# Patient Record
Sex: Female | Born: 1980 | Race: White | Hispanic: No | Marital: Single | Smoking: Never smoker
Health system: Southern US, Community
[De-identification: ages and names within clinical notes are randomized; demographics above are authoritative.]

## PROBLEM LIST (undated history)

## (undated) ENCOUNTER — Encounter

## (undated) ENCOUNTER — Ambulatory Visit

## (undated) ENCOUNTER — Telehealth

## (undated) ENCOUNTER — Ambulatory Visit: Payer: BLUE CROSS/BLUE SHIELD

## (undated) ENCOUNTER — Ambulatory Visit: Attending: Family | Primary: Family

## (undated) ENCOUNTER — Encounter
Attending: Student in an Organized Health Care Education/Training Program | Primary: Student in an Organized Health Care Education/Training Program

## (undated) ENCOUNTER — Ambulatory Visit
Payer: Medicaid (Managed Care) | Attending: Student in an Organized Health Care Education/Training Program | Primary: Student in an Organized Health Care Education/Training Program

## (undated) ENCOUNTER — Telehealth
Attending: Student in an Organized Health Care Education/Training Program | Primary: Student in an Organized Health Care Education/Training Program

## (undated) ENCOUNTER — Ambulatory Visit: Payer: Medicaid (Managed Care)

## (undated) DIAGNOSIS — Z803 Family history of malignant neoplasm of breast: Secondary | ICD-10-CM

## (undated) DIAGNOSIS — G40802 Other epilepsy, not intractable, without status epilepticus: Secondary | ICD-10-CM

## (undated) DIAGNOSIS — Z8614 Personal history of Methicillin resistant Staphylococcus aureus infection: Secondary | ICD-10-CM

## (undated) DIAGNOSIS — R519 Headache, unspecified: Secondary | ICD-10-CM

## (undated) DIAGNOSIS — Z8739 Personal history of other diseases of the musculoskeletal system and connective tissue: Secondary | ICD-10-CM

## (undated) DIAGNOSIS — T4145XA Adverse effect of unspecified anesthetic, initial encounter: Secondary | ICD-10-CM

## (undated) DIAGNOSIS — R569 Unspecified convulsions: Secondary | ICD-10-CM

## (undated) DIAGNOSIS — Z87442 Personal history of urinary calculi: Secondary | ICD-10-CM

## (undated) DIAGNOSIS — G809 Cerebral palsy, unspecified: Secondary | ICD-10-CM

## (undated) DIAGNOSIS — K219 Gastro-esophageal reflux disease without esophagitis: Secondary | ICD-10-CM

## (undated) DIAGNOSIS — L732 Hidradenitis suppurativa: Secondary | ICD-10-CM

## (undated) DIAGNOSIS — R51 Headache: Secondary | ICD-10-CM

## (undated) DIAGNOSIS — G40219 Localization-related (focal) (partial) symptomatic epilepsy and epileptic syndromes with complex partial seizures, intractable, without status epilepticus: Secondary | ICD-10-CM

## (undated) DIAGNOSIS — G40909 Epilepsy, unspecified, not intractable, without status epilepticus: Secondary | ICD-10-CM

## (undated) DIAGNOSIS — T8859XA Other complications of anesthesia, initial encounter: Secondary | ICD-10-CM

## (undated) DIAGNOSIS — Z9689 Presence of other specified functional implants: Secondary | ICD-10-CM

## (undated) HISTORY — DX: Localization-related (focal) (partial) symptomatic epilepsy and epileptic syndromes with complex partial seizures, intractable, without status epilepticus: G40.219

## (undated) HISTORY — PX: AXILLARY HIDRADENITIS EXCISION: SUR522

## (undated) HISTORY — DX: Presence of other specified functional implants: Z96.89

## (undated) HISTORY — DX: Family history of malignant neoplasm of breast: Z80.3

## (undated) HISTORY — DX: Other epilepsy, not intractable, without status epilepticus: G40.802

## (undated) HISTORY — DX: Cerebral palsy, unspecified: G80.9

## (undated) HISTORY — DX: Hidradenitis suppurativa: L73.2

## (undated) HISTORY — DX: Personal history of other diseases of the musculoskeletal system and connective tissue: Z87.39

## (undated) HISTORY — DX: Epilepsy, unspecified, not intractable, without status epilepticus: G40.909

## (undated) HISTORY — PX: TONSILLECTOMY: SUR1361

## (undated) HISTORY — PX: TYMPANOSTOMY TUBE PLACEMENT: SHX32

## (undated) MED ORDER — MAGNESIUM OXIDE 400 MG (241.3 MG MAGNESIUM) TABLET: Freq: Every day | ORAL | 0.00000 days

## (undated) MED ORDER — ASCORBIC ACID (VITAMIN C) 1,000 MG TABLET: ORAL | 0.00000 days

---
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
## 2004-07-02 HISTORY — PX: OTHER SURGICAL HISTORY: SHX169

## 2006-07-02 DIAGNOSIS — Z8614 Personal history of Methicillin resistant Staphylococcus aureus infection: Secondary | ICD-10-CM

## 2006-07-02 HISTORY — DX: Personal history of Methicillin resistant Staphylococcus aureus infection: Z86.14

## 2008-07-02 HISTORY — PX: KNEE SURGERY: SHX244

## 2015-03-03 HISTORY — PX: VAGUS NERVE STIMULATOR GENERATOR CHANGE: SHX6854

## 2017-04-01 HISTORY — PX: OTHER SURGICAL HISTORY: SHX169

## 2017-11-21 ENCOUNTER — Emergency Department
Admission: EM | Admit: 2017-11-21 | Discharge: 2017-11-21 | Disposition: A | Discharge status: Home / Self Care | Payer: Medicaid Other | Attending: Emergency Medicine | Admitting: Emergency Medicine

## 2017-11-21 ENCOUNTER — Encounter: Payer: Self-pay | Admitting: *Deleted

## 2017-11-21 ENCOUNTER — Other Ambulatory Visit: Payer: Self-pay

## 2017-11-21 DIAGNOSIS — R2242 Localized swelling, mass and lump, left lower limb: Secondary | ICD-10-CM | POA: Diagnosis present

## 2017-11-21 DIAGNOSIS — L0291 Cutaneous abscess, unspecified: Secondary | ICD-10-CM

## 2017-11-21 DIAGNOSIS — L02214 Cutaneous abscess of groin: Secondary | ICD-10-CM | POA: Insufficient documentation

## 2017-11-21 HISTORY — DX: Unspecified convulsions: R56.9

## 2017-11-21 MED ORDER — HYDROMORPHONE HCL 1 MG/ML IJ SOLN: 0.5000 mg | Freq: Once | INTRAMUSCULAR | Status: DC

## 2017-11-21 MED ORDER — OXYCODONE-ACETAMINOPHEN 5-325 MG PO TABS: 1.0000 | ORAL_TABLET | Freq: Three times a day (TID) | ORAL | 0 refills | Status: DC | PRN

## 2017-11-21 MED ORDER — CLINDAMYCIN HCL 300 MG PO CAPS: 300.0000 mg | ORAL_CAPSULE | Freq: Three times a day (TID) | ORAL | 0 refills | Status: DC

## 2017-11-21 MED ORDER — LIDOCAINE-EPINEPHRINE 2 %-1:100000 IJ SOLN
10.0000 mL | Freq: Once | INTRAMUSCULAR | Status: AC
  Administered 2017-11-21: 10 mL via INTRADERMAL
  Filled 2017-11-21: qty 10

## 2017-11-21 MED ORDER — MUPIROCIN 2 % EX OINT: TOPICAL_OINTMENT | CUTANEOUS | 0 refills | Status: DC

## 2017-11-21 MED ORDER — HYDROMORPHONE HCL 1 MG/ML IJ SOLN
1.0000 mg | Freq: Once | INTRAMUSCULAR | Status: DC
  Administered 2017-11-21: 0.5 mg via INTRAMUSCULAR
  Filled 2017-11-21: qty 1

## 2017-11-21 NOTE — ED Triage Notes (Addendum)
Pt to ED reporting having a hx of hidradenitis suppurativa with 12 surgeries to remove cysts in armpits and left side of groin. Pt reports for the past week she has had a worsening mass in her left groin again that has spread in to the lower part of her abd. Area is red at this time. No drainage noted. Pt has been taking antibiotics preventatively and has not seen improvement. No fevers reported.

## 2017-11-21 NOTE — ED Provider Notes (Signed)
[facility] Emergency Department Provider Note       Time seen: ----------------------------------------- 1:49 PM on 11/21/2017 -----------------------------------------   I have reviewed the triage vital signs and the nursing notes.  HISTORY   Chief Complaint Abscess    HPI Destiny Flores is a 37 y.o. female with a history of seizures who presents to the ED for possible abscess.  Patient has a history of hidradenitis and has had 12 surgeries to remove cyst in the armpits in her groin.  Over the past week she has had a worsening mass in the left suprapubic area.  She is having redness and swelling with pain to the area at this time.  She was taking Keflex to try to prevent this from happening.  She denies fevers, chills or other complaints.  Past Medical History:  Diagnosis Date  . Seizures (HCC)     There are no active problems to display for this patient.   History reviewed. No pertinent surgical history.  Allergies Codeine and Penicillins  Social History Social History   Tobacco Use  . Smoking status: Never Smoker  . Smokeless tobacco: Never Used  Substance Use Topics  . Alcohol use: Never    Frequency: Never  . Drug use: Never   Review of Systems Constitutional: Negative for fever. Cardiovascular: Negative for chest pain. Respiratory: Negative for shortness of breath. Gastrointestinal: Positive for lower abdominal pain over the site of an abscess Musculoskeletal: Negative for back pain. Skin: Positive for skin erythema and possible abscess formation Neurological: Negative for headaches, focal weakness or numbness.  All systems negative/normal/unremarkable except as stated in the HPI  ____________________________________________   PHYSICAL EXAM:  VITAL SIGNS: ED Triage Vitals [11/21/17 1343]  Enc Vitals Group     BP 111/68     Pulse Rate 78     Resp 16     Temp 97.7 F (36.5 C)     Temp Source Oral     SpO2 99 %   Weight 221 lb (100.2 kg)     Height  (1.626 m)     Head Circumference      Peak Flow      Pain Score 8     Pain Loc      Pain Edu?      Excl. in GC?    Constitutional: Alert and oriented. Well appearing and in no distress. Gastrointestinal: Soft with focal tenderness just to the left of midline in the suprapubic region around the site of induration and erythema.  This approaches her low transverse cesarean section site Musculoskeletal: Nontender with normal range of motion in extremities. No lower extremity tenderness nor edema. Neurologic:  Normal speech and language. No gross focal neurologic deficits are appreciated.  Skin: Erythema and induration in the suprapubic region Psychiatric: Mood and affect are normal. Speech and behavior are normal.  ____________________________________________  ED COURSE:  As part of my medical decision making, I reviewed the following data within the electronic MEDICAL RECORD NUMBER History obtained from family if available, nursing notes, old chart and ekg, as well as notes from prior ED visits. Patient presented for skin abscess and cellulitis, we will assess with labs and imaging as indicated at this time.   Marland Kitchen.Incision and Drainage Date/Time: 11/21/2017 2:05 PM Performed by: Emily Filbert, MD Authorized by: Emily Filbert, MD   Consent:    Consent obtained:  Verbal   Consent given by:  Patient   Risks discussed:  Infection Location:  Type:  Abscess   Location:  Trunk   Trunk location:  Abdomen Pre-procedure details:    Skin preparation:  Betadine Anesthesia (see MAR for exact dosages):    Anesthesia method:  Local infiltration   Local anesthetic:  Lidocaine 2% WITH epi Procedure type:    Complexity:  Complex Procedure details:    Needle aspiration: no     Incision types:  Single straight   Incision depth:  Subcutaneous   Scalpel blade:  11   Drainage:  Purulent   Drainage amount:  Moderate   Packing materials:  1/4 in  iodoform gauze Post-procedure details:    Patient tolerance of procedure:  Tolerated well, no immediate complications   ____________________________________________  DIFFERENTIAL DIAGNOSIS   Cellulitis, abscess, hidradenitis  FINAL ASSESSMENT AND PLAN  Cellulitis, abscess   Plan: The patient had presented for redness and swelling in the lower abdomen.  She received a shot of Dilaudid IM, and underwent abscess incision and drainage as dictated above.  She is stable for outpatient follow-up.   Ulice Dash, MD   Note: This note was generated in part or whole with voice recognition software. Voice recognition is usually quite accurate but there are transcription errors that can and very often do occur. I apologize for any typographical errors that were not detected and corrected.     Emily Filbert, MD 11/21/17 [phone]

## 2017-12-10 ENCOUNTER — Encounter: Payer: Self-pay | Admitting: Emergency Medicine

## 2017-12-10 ENCOUNTER — Emergency Department
Admission: EM | Admit: 2017-12-10 | Discharge: 2017-12-10 | Disposition: A | Discharge status: Home / Self Care | Payer: Medicaid Other | Attending: Emergency Medicine | Admitting: Emergency Medicine

## 2017-12-10 ENCOUNTER — Other Ambulatory Visit: Payer: Self-pay

## 2017-12-10 DIAGNOSIS — L0291 Cutaneous abscess, unspecified: Secondary | ICD-10-CM

## 2017-12-10 DIAGNOSIS — L02211 Cutaneous abscess of abdominal wall: Secondary | ICD-10-CM | POA: Diagnosis not present

## 2017-12-10 DIAGNOSIS — L732 Hidradenitis suppurativa: Secondary | ICD-10-CM | POA: Insufficient documentation

## 2017-12-10 HISTORY — DX: Hidradenitis suppurativa: L73.2

## 2017-12-10 MED ORDER — MUPIROCIN 2 % EX OINT: TOPICAL_OINTMENT | CUTANEOUS | 0 refills | Status: DC

## 2017-12-10 MED ORDER — CEPHALEXIN 500 MG PO CAPS: 500.0000 mg | ORAL_CAPSULE | Freq: Three times a day (TID) | ORAL | 0 refills | Status: DC

## 2017-12-10 MED ORDER — OXYCODONE-ACETAMINOPHEN 5-325 MG PO TABS: 1.0000 | ORAL_TABLET | Freq: Three times a day (TID) | ORAL | 0 refills | Status: DC | PRN

## 2017-12-10 NOTE — Discharge Instructions (Addendum)
Follow-up with Dr. Everlene FarrierPabon for evaluation of your abscess.  Apply warm compress to the area.  Take medication as prescribed.  If you are worsening please return the emergency department

## 2017-12-10 NOTE — ED Notes (Signed)
See triage note  Presents with abscess area to abd   States she developed about 3 areas over the past few days  Has one area that it draining in abd fold

## 2017-12-10 NOTE — ED Provider Notes (Signed)
[facility] Emergency Department Provider Note  ____________________________________________   First MD Initiated Contact with Patient 12/10/17 1306     (approximate)  I have reviewed the triage vital signs and the nursing notes.   HISTORY  Chief Complaint Abscess    HPI Destiny FurnishRebecca Zechman is a 37 y.o. female presents emergency department complaining of an abscess to the abdominal area.  She states that she has hidradenitis.  She gets these areas often.  She just finished clindamycin 4 days ago.  States the area is very sore and has not started to drain yet.  She denies any fever or chills.  She denies any vomiting or diarrhea.  She states her surgeon from where she used to live always gave her Keflex which worked better.  Past Medical History:  Diagnosis Date  . Hydradenitis   . Seizures (HCC)     There are no active problems to display for this patient.   Past Surgical History:  Procedure Laterality Date  . abscess surgery     armpits and groin.  Marland Kitchen. KNEE SURGERY    . NOSE SURGERY    . TONSILLECTOMY    . TYMPANOSTOMY TUBE PLACEMENT      Prior to Admission medications   Medication Sig Start Date End Date Taking? Authorizing Provider  cephALEXin (KEFLEX) 500 MG capsule Take 1 capsule (500 mg total) by mouth 3 (three) times daily. 12/10/17   Eliani Leclere, Roselyn BeringSusan W, PA-C  mupirocin ointment (BACTROBAN) 2 % Apply to affected area 3 times daily 12/10/17   Sherrie MustacheFisher, Roselyn BeringSusan W, PA-C  oxyCODONE-acetaminophen (PERCOCET) 5-325 MG tablet Take 1-2 tablets by mouth every 8 (eight) hours as needed. 12/10/17   Graycee Greeson, Roselyn BeringSusan W, PA-C    Allergies Codeine; Penicillins; and Sulfa antibiotics  No family history on file.  Social History Social History   Tobacco Use  . Smoking status: Never Smoker  . Smokeless tobacco: Never Used  Substance Use Topics  . Alcohol use: Never    Frequency: Never  . Drug use: Never    Review of Systems  Constitutional: No  fever/chills Eyes: No visual changes. ENT: No sore throat. Respiratory: Denies cough Genitourinary: Negative for dysuria. Musculoskeletal: Negative for back pain. Skin: Negative for rash.  Positive for abscess on the abdomen    ____________________________________________   PHYSICAL EXAM:  VITAL SIGNS: ED Triage Vitals  Enc Vitals Group     BP 12/10/17 1241 104/60     Pulse Rate 12/10/17 1241 83     Resp 12/10/17 1241 18     Temp 12/10/17 1241 97.9 F (36.6 C)     Temp Source 12/10/17 1241 Oral     SpO2 12/10/17 1241 100 %     Weight 12/10/17 1241 220 lb (99.8 kg)     Height 12/10/17 1241 5\' 4"  (1.626 m)     Head Circumference --      Peak Flow --      Pain Score 12/10/17 1246 6     Pain Loc --      Pain Edu? --      Excl. in GC? --     Constitutional: Alert and oriented. Well appearing and in no acute distress. Eyes: Conjunctivae are normal.  Head: Atraumatic. Nose: No congestion/rhinnorhea. Mouth/Throat: Mucous membranes are moist.   Cardiovascular: Normal rate, regular rhythm. Respiratory: Normal respiratory effort.  No retractions GU: deferred Musculoskeletal: FROM all extremities, warm and well perfused Neurologic:  Normal speech and language.  Skin:  Skin is warm,  dry .  Positive for a abscess on the lower abdomen which tracks to an opening where there is purulent drainage with a strong odor. Psychiatric: Mood and affect are normal. Speech and behavior are normal.  ____________________________________________   LABS (all labs ordered are listed, but only abnormal results are displayed)  Labs Reviewed - No data to display ____________________________________________   ____________________________________________  RADIOLOGY    ____________________________________________   PROCEDURES  Procedure(s) performed: No  Procedures    ____________________________________________   INITIAL IMPRESSION / ASSESSMENT AND PLAN / ED COURSE  Pertinent  labs & imaging results that were available during my care of the patient were reviewed by me and considered in my medical decision making (see chart for details).  Patient is 37 year old female presents emergency department complaining of abscess to the lower abdomen.  She states the area has been there over the past few weeks and she just recently finished clindamycin.  The area is painless is not started to drain yet.  On physical exam the abdomen has a abscess that tracks to an opening which has purulent drainage with a strong odor.  Remainder the exam is unremarkable  Nursing staff is to apply an ABD pad  Patient was given a prescription for Keflex, Percocet and Bactroban ointment.  She is given the phone number for a Biomedical engineer.  She is to follow-up with them for any issues.  She is to return to the emergency department if worsening.  She is discharged in stable condition.     As part of my medical decision making, I reviewed the following data within the electronic MEDICAL RECORD NUMBER Nursing notes reviewed and incorporated, Old chart reviewed, Notes from prior ED visits and [place] Controlled Substance Database  ____________________________________________   FINAL CLINICAL IMPRESSION(S) / ED DIAGNOSES  Final diagnoses:  Abscess      NEW MEDICATIONS STARTED DURING THIS VISIT:  New Prescriptions   CEPHALEXIN (KEFLEX) 500 MG CAPSULE    Take 1 capsule (500 mg total) by mouth 3 (three) times daily.     Note:  This document was prepared using Dragon voice recognition software and may include unintentional dictation errors.    Faythe Ghee, PA-C 12/10/17 1324    Nita Sickle, MD 12/11/17 1248

## 2017-12-10 NOTE — ED Notes (Signed)
Dressing applied to lower abd

## 2017-12-10 NOTE — ED Triage Notes (Signed)
Cyst that looks infected left lower abd.  History of hydrodinitis

## 2017-12-24 DIAGNOSIS — G809 Cerebral palsy, unspecified: Secondary | ICD-10-CM

## 2017-12-24 DIAGNOSIS — L732 Hidradenitis suppurativa: Secondary | ICD-10-CM | POA: Insufficient documentation

## 2017-12-24 DIAGNOSIS — Z9689 Presence of other specified functional implants: Secondary | ICD-10-CM

## 2017-12-24 DIAGNOSIS — G40802 Other epilepsy, not intractable, without status epilepticus: Secondary | ICD-10-CM

## 2017-12-24 DIAGNOSIS — G40909 Epilepsy, unspecified, not intractable, without status epilepticus: Secondary | ICD-10-CM

## 2017-12-24 DIAGNOSIS — G40219 Localization-related (focal) (partial) symptomatic epilepsy and epileptic syndromes with complex partial seizures, intractable, without status epilepticus: Secondary | ICD-10-CM

## 2017-12-24 HISTORY — DX: Localization-related (focal) (partial) symptomatic epilepsy and epileptic syndromes with complex partial seizures, intractable, without status epilepticus: G40.219

## 2017-12-24 HISTORY — DX: Other epilepsy, not intractable, without status epilepticus: G40.802

## 2017-12-24 HISTORY — DX: Epilepsy, unspecified, not intractable, without status epilepticus: G40.909

## 2017-12-24 HISTORY — DX: Cerebral palsy, unspecified: G80.9

## 2017-12-24 HISTORY — DX: Hidradenitis suppurativa: L73.2

## 2017-12-24 HISTORY — DX: Presence of other specified functional implants: Z96.89

## 2017-12-26 ENCOUNTER — Encounter: Payer: Self-pay | Admitting: Surgery

## 2017-12-26 ENCOUNTER — Ambulatory Visit (INDEPENDENT_AMBULATORY_CARE_PROVIDER_SITE_OTHER): Payer: Medicaid Other | Admitting: Surgery

## 2017-12-26 VITALS — BP 118/83 | HR 77 | Temp 98.2°F | Ht 64.0 in | Wt 231.6 lb

## 2017-12-26 DIAGNOSIS — L732 Hidradenitis suppurativa: Secondary | ICD-10-CM | POA: Diagnosis not present

## 2017-12-26 NOTE — H&P (View-Only) (Signed)
Patient ID: Destiny Flores, female   DOB: [birth year], 37 y.o.   MRN: [id number]  HPI Destiny Flores is a 37 y.o. 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  She reports that she has had at least 13 operation for her hidradenitis on both axillas and both groins.  She recently moved from [place] and has been dealing with hidradenitis for several years.  Previously has seen multiple plastic surgeons and multiple procedures have been attempted including excision of hidradenitis with primary closure and wound VAC placement as well as excision of hidradenitis with debridement and secondary intention healing. He was recently seen in the emergency room and abscess was seen.  She was placed on antibiotics and she has improved somewhat.  She continues to have persistent drainage from the groins.  She has intermittent mo moderate pain that is sharp in nature.  No fevers no chills.  She does not smoke.  She has previously been on immunological therapy by a dermatologist in [place].  She does not have a current dermatologist at this time. She is able to perform 4 METS of activity without any shortness of breath chest pain. No operative reports have been found and I do not have access to out-of-state records.  HPI  Past Medical History:  Diagnosis Date  . Cerebral palsy (HCC) 12/24/2017  . Epilepsy (HCC) 12/24/2017  . 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 Hidradenitis suppurativa 12/24/2017  . Hydradenitis   . Seizures (HCC)   . 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 abscess surgery     armpits and groin.  Marland Kitchen AXILLARY HIDRADENITIS EXCISION    . hidradenitis groin    . KNEE SURGERY    . NOSE SURGERY    . TONSILLECTOMY    . TYMPANOSTOMY TUBE PLACEMENT      Family  History  Problem Relation Age of Onset  . Hyperlipidemia Father   . Epilepsy Brother   . Breast cancer Paternal Grandmother     Social History Social History   Tobacco Use  . Smoking status: Never Smoker  . Smokeless tobacco: Never Used  Substance Use Topics  . Alcohol use: Never    Frequency: Never  . Drug use: Never    Allergies  Allergen Reactions  . Codeine Anaphylaxis  . Penicillins Anaphylaxis  . Sulfa Antibiotics Hives    Current Outpatient Medications  Medication Sig Dispense Refill  . acetaZOLAMIDE (DIAMOX) 500 MG capsule     . cephALEXin (KEFLEX) 500 MG capsule Take 1 capsule (500 mg total) by mouth 3 (three) times daily. 21 capsule 0  . Cholecalciferol (VITAMIN D3) 50000 units CAPS     . clindamycin (CLINDAGEL) 1 % gel     . diazepam (VALIUM) 5 MG tablet     . felbamate (FELBATOL) 400 MG tablet     . felbamate (FELBATOL) 600 MG tablet     . metoCLOPramide (REGLAN) 10 MG tablet     . Multiple Vitamin (DAILY-VITE) TABS     . mupirocin ointment (BACTROBAN) 2 % Apply to affected area 3 times daily 22 g 0  . omeprazole (PRILOSEC) 20 MG capsule     . oxyCODONE-acetaminophen (PERCOCET) 5-325 MG tablet Take 1-2 tablets by mouth every 8 (eight) hours as needed. 20 tablet 0  . Turmeric 1053 MG  TABS     . verapamil (CALAN) 120 MG tablet     . zinc gluconate ([employer] ZINC) 50 MG tablet      No current facility-administered medications for this visit.      Review of Systems Full ROS  was asked and was negative except for the information on the HPI  Physical Exam Blood pressure 118/83, pulse 77, temperature 98.2 F (36.8 C), temperature source Oral, height 5\' 4"  (1.626 m), weight 105.1 kg (231 lb 9.6 oz). CONSTITUTIONAL: NAD EYES: Pupils are equal, round, and reactive to light, Sclera are non-icteric. EARS, NOSE, MOUTH AND THROAT: The oropharynx is clear. The oral mucosa is pink and moist. Hearing is intact to voice. LYMPH NODES:  Lymph nodes in the neck are  normal. RESPIRATORY:  Lungs are clear. There is normal respiratory effort, with equal breath sounds bilaterally, and without pathologic use of accessory muscles. CARDIOVASCULAR: Heart is regular without murmurs, gallops, or rubs. GI: The abdomen is  soft, nontender, and nondistended. There are no palpable masses. There is no hepatosplenomegaly. There are normal bowel sounds in all quadrants. GU: Rectal deferred.   MUSCULOSKELETAL: Normal muscle strength and tone. No cyanosis or edema.   SKIN: There is multiple sinus tracts on the groin mainly on the left side but there is some towards the midline as well.  There is no evidence of a necrotizing infection or obvious abscess at this time.   NEUROLOGIC: Motor and sensation is grossly normal. Cranial nerves are grossly intact. PSYCH:  Oriented to person, place and time. Affect is normal.  Assessment/Plan complex and recurrent hidradenitis of the groin on a 37 year old female with significant comorbidities including cerebral palsy and seizure disorder as well as obesity.  I had a lengthy discussion with the patient and her mother in detail.  I explained to them specifically that hidradenitis is a chronic and complex disease.  Since these lesions have been present for several months and there is still some tracts I do think that the next best step will be to perform an excision and debridement of the tract and let the wound heal by secondary intention.  Discussed with the patient and the mother again in detail about the procedure.  Risk, benefit and possible complications including but not limited to: Bleeding, infection, chronic pain, prolonged wound healing.  She understands and wishes to proceed.  I also gave her the option of referral to a plastic surgeon but they do feel comfortable with me taking care of this issue at this time.  Extensive counseling provided   Sterling Bigiego Pabon, MD FACS General Surgeon 12/27/2017, 12:20 PM

## 2017-12-26 NOTE — Patient Instructions (Signed)
Please see your blue sheet for surgery information.  We will call you with the exact surgery date.      Wash with Chlorhexadine daily in the place of your regular soap or body wash to these areas.  If you have an abscess that you have been trying the above recommended therapy and this is not working, call our office and speak with a nurse so that we may work you in to our schedule.   Hidradenitis Suppurativa Hidradenitis suppurativa is a long-term (chronic) skin disease that starts with blocked sweat glands or hair follicles. Bacteria may grow in these blocked openings of your skin. Hidradenitis suppurativa is like a severe form of acne that develops in areas of your body where acne would be unusual. It is most likely to affect the areas of your body where skin rubs against skin and becomes moist. This includes your:  Underarms.  Groin.  Genital areas.  Buttocks.  Upper thighs.  Breasts. Hidradenitis suppurativa may start out with small pimples. The pimples can develop into deep sores that break open (rupture) and drain pus. Over time your skin may thicken and become scarred. Hidradenitis suppurativa cannot be passed from person to person.  CAUSES  The exact cause of hidradenitis suppurativa is not known. This condition may be due to:  Female and female hormones. The condition is rare before and after puberty.  An overactive body defense system (immune system). Your immune system may overreact to the blocked hair follicles or sweat glands and cause swelling and pus-filled sores. RISK FACTORS You may have a higher risk of hidradenitis suppurativa if you:  Are a woman.  Are between ages 8011 and 8555.  Have a family history of hidradenitis suppurativa.  Have a personal history of acne.  Are overweight.  Smoke.  Take the drug lithium. SIGNS AND SYMPTOMS  The first signs of an outbreak are usually painful skin bumps that look like pimples. As the condition progresses:  Skin  bumps may get bigger and grow deeper into the skin.  Bumps under the skin may rupture and drain smelly pus.  Skin may become itchy and infected.  Skin may thicken and scar.  Drainage may continue through tunnels under the skin (fistulas).  Walking and moving your arms can become painful. DIAGNOSIS  Your health care provider may diagnose hidradenitis suppurativa based on your medical history and your signs and symptoms. A physical exam will also be done. You may need to see a health care provider who specializes in skin diseases (dermatologist). You may also have tests done to confirm the diagnosis. These can include:  Swabbing a sample of pus or drainage from your skin so it can be sent to the lab and tested for infection.  Blood tests to check for infection. TREATMENT  The same treatment will not work for everybody with hidradenitis suppurativa. Your treatment will depend on how severe your symptoms are. You may need to try several treatments to find what works best for you. Part of your treatment may include cleaning and bandaging (dressing) your wounds. You may also have to take medicines, such as the following:  Antibiotics.  Acne medicines.  Medicines to block or suppress the immune system.  A diabetes medicine (metformin) is sometimes used to treat this condition.  For women, birth control pills can sometimes help relieve symptoms. You may need surgery if you have a severe case of hidradenitis suppurativa that does not respond to medicine. Surgery may involve:   Using a  laser to clear the skin and remove hair follicles.  Opening and draining deep sores.  Removing the areas of skin that are diseased and scarred. HOME CARE INSTRUCTIONS  Learn as much as you can about your disease, and work closely with your health care providers.  Take medicines only as directed by your health care provider.  If you were prescribed an antibiotic medicine, finish it all even if you start  to feel better.  If you are overweight, losing weight may be very helpful. Try to reach and maintain a healthy weight.  Do not use any tobacco products, including cigarettes, chewing tobacco, or electronic cigarettes. If you need help quitting, ask your health care provider.  Do not shave the areas where you get hidradenitis suppurativa.  Do not wear deodorant.  Wear loose-fitting clothes.  Try not to overheat and get sweaty.  Take a daily bleach bath as directed by your health care provider.  Fill your bathtub halfway with water.  Pour in  cup of unscented household bleach.  Soak for 5-10 minutes.  Cover sore areas with a warm, clean washcloth (compress) for 5-10 minutes. SEEK MEDICAL CARE IF:   You have a flare-up of hidradenitis suppurativa.  You have chills or a fever.  You are having trouble controlling your symptoms at home.   This information is not intended to replace advice given to you by your health care provider. Make sure you discuss any questions you have with your health care provider.   Document Released: 01/31/2004 Document Revised: 07/09/2014 Document Reviewed: 09/18/2013 Elsevier Interactive Patient Education [employer]

## 2017-12-26 NOTE — Progress Notes (Signed)
Patient ID: Destiny Flores, female   DOB: 06/03/1981, 37 y.o.   MRN: [id number]  HPI Destiny Flores is a 37 y.o. 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  She reports that she has had at least 13 operation for her hidradenitis on both axillas and both groins.  She recently moved from [place] and has been dealing with hidradenitis for several years.  Previously has seen multiple plastic surgeons and multiple procedures have been attempted including excision of hidradenitis with primary closure and wound VAC placement as well as excision of hidradenitis with debridement and secondary intention healing. He was recently seen in the emergency room and abscess was seen.  She was placed on antibiotics and she has improved somewhat.  She continues to have persistent drainage from the groins.  She has intermittent mo moderate pain that is sharp in nature.  No fevers no chills.  She does not smoke.  She has previously been on immunological therapy by a dermatologist in [place].  She does not have a current dermatologist at this time. She is able to perform 4 METS of activity without any shortness of breath chest pain. No operative reports have been found and I do not have access to out-of-state records.  HPI  Past Medical History:  Diagnosis Date  . Cerebral palsy (HCC) 12/24/2017  . Epilepsy (HCC) 12/24/2017  . 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 Hidradenitis suppurativa 12/24/2017  . Hydradenitis   . Seizures (HCC)   . 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 abscess surgery     armpits and groin.  . AXILLARY HIDRADENITIS EXCISION    . hidradenitis groin    . KNEE SURGERY    . NOSE SURGERY    . TONSILLECTOMY    . TYMPANOSTOMY TUBE PLACEMENT      Family  History  Problem Relation Age of Onset  . Hyperlipidemia Father   . Epilepsy Brother   . Breast cancer Paternal Grandmother     Social History Social History   Tobacco Use  . Smoking status: Never Smoker  . Smokeless tobacco: Never Used  Substance Use Topics  . Alcohol use: Never    Frequency: Never  . Drug use: Never    Allergies  Allergen Reactions  . Codeine Anaphylaxis  . Penicillins Anaphylaxis  . Sulfa Antibiotics Hives    Current Outpatient Medications  Medication Sig Dispense Refill  . acetaZOLAMIDE (DIAMOX) 500 MG capsule     . cephALEXin (KEFLEX) 500 MG capsule Take 1 capsule (500 mg total) by mouth 3 (three) times daily. 21 capsule 0  . Cholecalciferol (VITAMIN D3) 50000 units CAPS     . clindamycin (CLINDAGEL) 1 % gel     . diazepam (VALIUM) 5 MG tablet     . felbamate (FELBATOL) 400 MG tablet     . felbamate (FELBATOL) 600 MG tablet     . metoCLOPramide (REGLAN) 10 MG tablet     . Multiple Vitamin (DAILY-VITE) TABS     . mupirocin ointment (BACTROBAN) 2 % Apply to affected area 3 times daily 22 g 0  . omeprazole (PRILOSEC) 20 MG capsule     . oxyCODONE-acetaminophen (PERCOCET) 5-325 MG tablet Take 1-2 tablets by mouth every 8 (eight) hours as needed. 20 tablet 0  . Turmeric 1053 MG   TABS     . verapamil (CALAN) 120 MG tablet     . zinc gluconate ([employer] ZINC) 50 MG tablet      No current facility-administered medications for this visit.      Review of Systems Full ROS  was asked and was negative except for the information on the HPI  Physical Exam Blood pressure 118/83, pulse 77, temperature 98.2 F (36.8 C), temperature source Oral, height 5' 4" (1.626 m), weight 105.1 kg (231 lb 9.6 oz). CONSTITUTIONAL: NAD EYES: Pupils are equal, round, and reactive to light, Sclera are non-icteric. EARS, NOSE, MOUTH AND THROAT: The oropharynx is clear. The oral mucosa is pink and moist. Hearing is intact to voice. LYMPH NODES:  Lymph nodes in the neck are  normal. RESPIRATORY:  Lungs are clear. There is normal respiratory effort, with equal breath sounds bilaterally, and without pathologic use of accessory muscles. CARDIOVASCULAR: Heart is regular without murmurs, gallops, or rubs. GI: The abdomen is  soft, nontender, and nondistended. There are no palpable masses. There is no hepatosplenomegaly. There are normal bowel sounds in all quadrants. GU: Rectal deferred.   MUSCULOSKELETAL: Normal muscle strength and tone. No cyanosis or edema.   SKIN: There is multiple sinus tracts on the groin mainly on the left side but there is some towards the midline as well.  There is no evidence of a necrotizing infection or obvious abscess at this time.   NEUROLOGIC: Motor and sensation is grossly normal. Cranial nerves are grossly intact. PSYCH:  Oriented to person, place and time. Affect is normal.  Assessment/Plan complex and recurrent hidradenitis of the groin on a 29-year-old female with significant comorbidities including cerebral palsy and seizure disorder as well as obesity.  I had a lengthy discussion with the patient and her mother in detail.  I explained to them specifically that hidradenitis is a chronic and complex disease.  Since these lesions have been present for several months and there is still some tracts I do think that the next best step will be to perform an excision and debridement of the tract and let the wound heal by secondary intention.  Discussed with the patient and the mother again in detail about the procedure.  Risk, benefit and possible complications including but not limited to: Bleeding, infection, chronic pain, prolonged wound healing.  She understands and wishes to proceed.  I also gave her the option of referral to a plastic surgeon but they do feel comfortable with me taking care of this issue at this time.  Extensive counseling provided   Farida Mcreynolds, MD FACS General Surgeon 12/27/2017, 12:20 PM     

## 2017-12-30 ENCOUNTER — Telehealth: Payer: Self-pay | Admitting: Surgery

## 2017-12-30 NOTE — Telephone Encounter (Signed)
I have called patient to go over information below. No answer. I have left a message on the voicemail for patient to call back.    Pre op date/time and sx date. Sx: 01/16/18 with Dr Pabon--excision of hidradenitis of groin.  Pre op: 01/09/18 between 1-5:00pm--phone interview.   Patient made aware to call [phone], between 1-3:00pm the day before surgery, to find out what time to arrive.

## 2017-12-31 NOTE — Telephone Encounter (Signed)
Patient has called and all information has been given.  °

## 2017-12-31 NOTE — Telephone Encounter (Signed)
I have called the phone number of both patient and the mother. No answer. I have left a message on both voicemail's to inform the patient to call our office to go over surgery date and pre admit date and time.

## 2018-01-01 ENCOUNTER — Telehealth: Payer: Self-pay | Admitting: Surgery

## 2018-01-01 NOTE — Telephone Encounter (Signed)
I l/m for patient to call the office & give the fax # to Dr Orson GearMavene Mash so we are able to fax medical record release.

## 2018-01-09 ENCOUNTER — Other Ambulatory Visit: Payer: Self-pay

## 2018-01-09 ENCOUNTER — Encounter
Admission: RE | Admit: 2018-01-09 | Discharge: 2018-01-09 | Disposition: A | Discharge status: Home / Self Care | Payer: PRIVATE HEALTH INSURANCE | Source: Ambulatory Visit | Attending: Surgery | Admitting: Surgery

## 2018-01-09 HISTORY — DX: Headache: R51

## 2018-01-09 HISTORY — DX: Other complications of anesthesia, initial encounter: T88.59XA

## 2018-01-09 HISTORY — DX: Headache, unspecified: R51.9

## 2018-01-09 HISTORY — DX: Personal history of urinary calculi: Z87.442

## 2018-01-09 HISTORY — DX: Adverse effect of unspecified anesthetic, initial encounter: T41.45XA

## 2018-01-09 HISTORY — DX: Gastro-esophageal reflux disease without esophagitis: K21.9

## 2018-01-09 HISTORY — DX: Personal history of Methicillin resistant Staphylococcus aureus infection: Z86.14

## 2018-01-09 NOTE — Patient Instructions (Signed)
Your procedure is scheduled on: 01-16-18 THURSDAY Report to Same Day Surgery 2nd floor medical mall [facility](Medical Mall Entrance-take elevator on left to 2nd floor.  Check in with surgery information desk.) To find out your arrival time please call [phone] between 1PM - 3PM [facility]  Remember: Instructions that are not followed completely may result in serious medical risk, up to and including death, or upon the discretion of your surgeon and anesthesiologist your surgery may need to be rescheduled.    _x___ 1. Do not eat food after midnight the night before your procedure. NO GUM OR CANDY AFTER MIDNIGHT.  You may drink clear liquids up to 2 hours before you are scheduled to arrive at the hospital for your procedure.  Do not drink clear liquids within 2 hours of your scheduled arrival to the hospital.  Clear liquids include  --Water or Apple juice without pulp  --Clear carbohydrate beverage such as ClearFast or Gatorade  --Black Coffee or Clear Tea (No milk, no creamers, do not add anything to the coffee or Tea     __x__ 2. No Alcohol for 24 hours before or after surgery.   __x__3. No Smoking or e-cigarettes for 24 prior to surgery.  Do not use any chewable tobacco products for at least 6 hour prior to surgery   ____  4. Bring all medications with you on the day of surgery if instructed.    __x__ 5. Notify your doctor if there is any change in your medical condition     (cold, fever, infections).    x___6. On the morning of surgery brush your teeth with toothpaste and water.  You may rinse your mouth with mouth wash if you wish.  Do not swallow any toothpaste or mouthwash.   Do not wear jewelry, make-up, hairpins, clips or nail polish.  Do not wear lotions, powders, or perfumes. You may wear deodorant.  Do not shave 48 hours prior to surgery. Men may shave face and neck.  Do not bring valuables to the hospital.    [facility] is not responsible for any belongings or  valuables.               Contacts, dentures or bridgework may not be worn into surgery.  Leave your suitcase in the car. After surgery it may be brought to your room.  For patients admitted to the hospital, discharge time is determined by your treatment team.  _  Patients discharged the day of surgery will not be allowed to drive home.  You will need someone to drive you home and stay with you the night of your procedure.    Please read over the following fact sheets that you were given:   [facility] Preparing for [facility] and or MRSA Information   _x___ TAKE THE FOLLOWING MEDICATION THE MORNING OF SURGERY WITH A SMALL SIP OF WATER. These include:  1. PRILOSEC  2. FELBATOL  3.  4.  5.  6.  ____Fleets enema or Magnesium Citrate as directed.   _x___ Use CHG Soap or sage wipes as directed on instruction sheet   ____ Use inhalers on the day of surgery and bring to hospital day of surgery  ____ Stop Metformin and Janumet 2 days prior to surgery.    ____ Take 1/2 of usual insulin dose the night before surgery and none on the morning surgery.   ____ Follow recommendations from Cardiologist, Pulmonologist or PCP regarding stopping Aspirin, Coumadin, Plavix ,Eliquis, Effient, or Pradaxa,  and Pletal.  X____Stop Anti-inflammatories such as Advil, Aleve, Ibuprofen, Motrin, Naproxen, Naprosyn, Goodies powders, EXCEDRIN MIGRAINE or aspirin products NOW-OK to take Tylenol    _x___ Stop supplements until after surgery-STOP TURMERIC NOW-MAY RESUME AFTER SURGERY   ____ Bring C-Pap to the hospital.

## 2018-01-13 ENCOUNTER — Telehealth: Payer: Self-pay

## 2018-01-13 MED ORDER — CIPROFLOXACIN HCL 500 MG PO TABS: 500.0000 mg | ORAL_TABLET | Freq: Two times a day (BID) | ORAL | 0 refills | Status: DC

## 2018-01-13 NOTE — Telephone Encounter (Signed)
Patient's mother Destiny Flores called stating that her daughter is currently taking Keflex and it had no helped with the infection on her axillaries. I asked Dr. Excell Seltzerooper what we could for her since patient is due for surgery this week. He recommended for patient to stop taking Keflex and start taking CIPRO 500 MG BID for 10 days. This was informed to Destiny Flores and she stated that she will let her daughter know and that they will pick up the prescription today. I told Destiny Flores to call us again if she had further questions. She understood. Prescription will be e-scribed to patient's pharmacy.

## 2018-01-14 ENCOUNTER — Encounter
Admission: RE | Admit: 2018-01-14 | Discharge: 2018-01-14 | Disposition: A | Discharge status: Home / Self Care | Payer: Medicaid Other | Source: Ambulatory Visit | Attending: Surgery | Admitting: Surgery

## 2018-01-14 DIAGNOSIS — Z01812 Encounter for preprocedural laboratory examination: Secondary | ICD-10-CM | POA: Diagnosis not present

## 2018-01-14 LAB — CBC WITH DIFFERENTIAL/PLATELET
Basophils Absolute: 0.1 K/uL (ref 0–0.1)
Basophils Relative: 1 %
Eosinophils Absolute: 0.3 K/uL (ref 0–0.7)
Eosinophils Relative: 2 %
HCT: 39.5 % (ref 35.0–47.0)
Hemoglobin: 13.5 g/dL (ref 12.0–16.0)
Lymphocytes Relative: 19 %
Lymphs Abs: 2.6 K/uL (ref 1.0–3.6)
MCH: 29.2 pg (ref 26.0–34.0)
MCHC: 34.2 g/dL (ref 32.0–36.0)
MCV: 85.3 fL (ref 80.0–100.0)
Monocytes Absolute: 0.8 K/uL (ref 0.2–0.9)
Monocytes Relative: 6 %
Neutro Abs: 9.6 K/uL — ABNORMAL HIGH (ref 1.4–6.5)
Neutrophils Relative %: 72 %
Platelets: 441 K/uL — ABNORMAL HIGH (ref 150–440)
RBC: 4.62 MIL/uL (ref 3.80–5.20)
RDW: 13 % (ref 11.5–14.5)
WBC: 13.4 K/uL — ABNORMAL HIGH (ref 3.6–11.0)

## 2018-01-14 LAB — BASIC METABOLIC PANEL WITH GFR
Anion gap: 9 (ref 5–15)
BUN: 17 mg/dL (ref 6–20)
CO2: 19 mmol/L — ABNORMAL LOW (ref 22–32)
Calcium: 9.1 mg/dL (ref 8.9–10.3)
Chloride: 109 mmol/L (ref 98–111)
Creatinine, Ser: 0.51 mg/dL (ref 0.44–1.00)
GFR calc Af Amer: >60 mL/min
GFR calc non Af Amer: >60 mL/min
Glucose, Bld: 109 mg/dL — ABNORMAL HIGH (ref 70–99)
Potassium: 3.6 mmol/L (ref 3.5–5.1)
Sodium: 137 mmol/L (ref 135–145)

## 2018-01-14 LAB — SURGICAL PCR SCREEN
MRSA, PCR: NEGATIVE
Staphylococcus aureus: NEGATIVE

## 2018-01-15 MED ORDER — CLINDAMYCIN PHOSPHATE 900 MG/50ML IV SOLN
900.0000 mg | INTRAVENOUS | Status: AC
  Administered 2018-01-16: 900 mg via INTRAVENOUS

## 2018-01-16 ENCOUNTER — Ambulatory Visit: Payer: Medicaid Other

## 2018-01-16 ENCOUNTER — Encounter
Admission: RE | Disposition: A | Discharge status: Home / Self Care | Payer: Self-pay | Source: Ambulatory Visit | Attending: Surgery

## 2018-01-16 ENCOUNTER — Ambulatory Visit
Admission: RE | Admit: 2018-01-16 | Discharge: 2018-01-16 | Disposition: A | Discharge status: Home / Self Care | Payer: Medicaid Other | Source: Ambulatory Visit | Attending: Surgery | Admitting: Surgery

## 2018-01-16 ENCOUNTER — Encounter: Payer: Self-pay | Admitting: *Deleted

## 2018-01-16 DIAGNOSIS — Z8614 Personal history of Methicillin resistant Staphylococcus aureus infection: Secondary | ICD-10-CM | POA: Insufficient documentation

## 2018-01-16 DIAGNOSIS — L732 Hidradenitis suppurativa: Secondary | ICD-10-CM

## 2018-01-16 DIAGNOSIS — G40919 Epilepsy, unspecified, intractable, without status epilepticus: Secondary | ICD-10-CM | POA: Insufficient documentation

## 2018-01-16 DIAGNOSIS — G809 Cerebral palsy, unspecified: Secondary | ICD-10-CM | POA: Insufficient documentation

## 2018-01-16 DIAGNOSIS — Z79899 Other long term (current) drug therapy: Secondary | ICD-10-CM | POA: Diagnosis not present

## 2018-01-16 HISTORY — PX: HYDRADENITIS EXCISION: SHX5243

## 2018-01-16 LAB — POCT PREGNANCY, URINE: Preg Test, Ur: NEGATIVE

## 2018-01-16 SURGERY — EXCISION, HIDRADENITIS, AXILLA
Anesthesia: General | Wound class: "Clean Contaminated "

## 2018-01-16 MED ORDER — MIDAZOLAM HCL 2 MG/2ML IJ SOLN
INTRAMUSCULAR | Status: AC
  Filled 2018-01-16: qty 2

## 2018-01-16 MED ORDER — FENTANYL CITRATE (PF) 100 MCG/2ML IJ SOLN
INTRAMUSCULAR | Status: AC
  Filled 2018-01-16: qty 2

## 2018-01-16 MED ORDER — PROMETHAZINE HCL 25 MG/ML IJ SOLN: 6.2500 mg | INTRAMUSCULAR | Status: DC | PRN

## 2018-01-16 MED ORDER — ROCURONIUM BROMIDE 50 MG/5ML IV SOLN
INTRAVENOUS | Status: AC
  Filled 2018-01-16: qty 1

## 2018-01-16 MED ORDER — ONDANSETRON HCL 4 MG/2ML IJ SOLN
INTRAMUSCULAR | Status: DC | PRN
  Administered 2018-01-16: 4 mg via INTRAVENOUS

## 2018-01-16 MED ORDER — CHLORHEXIDINE GLUCONATE CLOTH 2 % EX PADS: 6.0000 | MEDICATED_PAD | Freq: Once | CUTANEOUS | Status: DC

## 2018-01-16 MED ORDER — ONDANSETRON HCL 4 MG/2ML IJ SOLN
INTRAMUSCULAR | Status: AC
  Filled 2018-01-16: qty 2

## 2018-01-16 MED ORDER — PROPOFOL 10 MG/ML IV BOLUS
INTRAVENOUS | Status: AC
  Filled 2018-01-16: qty 20

## 2018-01-16 MED ORDER — OXYCODONE-ACETAMINOPHEN 5-325 MG PO TABS
ORAL_TABLET | ORAL | Status: AC
  Administered 2018-01-16: 1 via ORAL
  Filled 2018-01-16: qty 1

## 2018-01-16 MED ORDER — DEXAMETHASONE SODIUM PHOSPHATE 10 MG/ML IJ SOLN
INTRAMUSCULAR | Status: AC
  Filled 2018-01-16: qty 1

## 2018-01-16 MED ORDER — LIDOCAINE HCL (PF) 2 % IJ SOLN
INTRAMUSCULAR | Status: AC
  Filled 2018-01-16: qty 10

## 2018-01-16 MED ORDER — FENTANYL CITRATE (PF) 100 MCG/2ML IJ SOLN
INTRAMUSCULAR | Status: DC | PRN
  Administered 2018-01-16: 25 ug via INTRAVENOUS
  Administered 2018-01-16 (×2): 50 ug via INTRAVENOUS
  Administered 2018-01-16: 25 ug via INTRAVENOUS
  Administered 2018-01-16: 50 ug via INTRAVENOUS

## 2018-01-16 MED ORDER — MEPERIDINE HCL 50 MG/ML IJ SOLN: 6.2500 mg | INTRAMUSCULAR | Status: DC | PRN

## 2018-01-16 MED ORDER — LACTATED RINGERS IV SOLN
INTRAVENOUS | Status: DC
  Administered 2018-01-16: 50 mL/h via INTRAVENOUS

## 2018-01-16 MED ORDER — FENTANYL CITRATE (PF) 100 MCG/2ML IJ SOLN
25.0000 ug | INTRAMUSCULAR | Status: DC | PRN
  Administered 2018-01-16 (×2): 50 ug via INTRAVENOUS

## 2018-01-16 MED ORDER — LIDOCAINE HCL (CARDIAC) PF 100 MG/5ML IV SOSY
PREFILLED_SYRINGE | INTRAVENOUS | Status: DC | PRN
  Administered 2018-01-16: 100 mg via INTRAVENOUS

## 2018-01-16 MED ORDER — DEXAMETHASONE SODIUM PHOSPHATE 10 MG/ML IJ SOLN
INTRAMUSCULAR | Status: DC | PRN
  Administered 2018-01-16: 5 mg via INTRAVENOUS

## 2018-01-16 MED ORDER — PROPOFOL 10 MG/ML IV BOLUS
INTRAVENOUS | Status: DC | PRN
  Administered 2018-01-16: 200 mg via INTRAVENOUS

## 2018-01-16 MED ORDER — OXYCODONE-ACETAMINOPHEN 5-325 MG PO TABS
1.0000 | ORAL_TABLET | ORAL | Status: DC | PRN
  Administered 2018-01-16: 1 via ORAL

## 2018-01-16 MED ORDER — MIDAZOLAM HCL 2 MG/2ML IJ SOLN
INTRAMUSCULAR | Status: DC | PRN
  Administered 2018-01-16: 2 mg via INTRAVENOUS

## 2018-01-16 MED ORDER — CLINDAMYCIN PHOSPHATE 900 MG/50ML IV SOLN
INTRAVENOUS | Status: AC
  Filled 2018-01-16: qty 50

## 2018-01-16 MED ORDER — OXYCODONE-ACETAMINOPHEN 5-325 MG PO TABS: 1.0000 | ORAL_TABLET | ORAL | 0 refills | Status: DC | PRN

## 2018-01-16 SURGICAL SUPPLY — 22 items
BLADE SURG 15 STRL LF DISP TIS (BLADE) ×1 IMPLANT
BLADE SURG 15 STRL SS (BLADE) ×1
CANISTER SUCT 1200ML W/VALVE (MISCELLANEOUS) ×2 IMPLANT
CHLORAPREP W/TINT 26ML (MISCELLANEOUS) ×2 IMPLANT
DERMABOND ADVANCED (GAUZE/BANDAGES/DRESSINGS) ×1
DERMABOND ADVANCED .7 DNX12 (GAUZE/BANDAGES/DRESSINGS) ×1 IMPLANT
DRAPE LAPAROTOMY TRNSV 106X77 (MISCELLANEOUS) ×2 IMPLANT
GAUZE PACKING IODOFORM 1/2 (PACKING) ×2 IMPLANT
GAUZE SPONGE 4X4 12PLY STRL (GAUZE/BANDAGES/DRESSINGS) ×2 IMPLANT
GLOVE BIO SURGEON STRL SZ7 (GLOVE) ×2 IMPLANT
GOWN STRL REUS W/ TWL LRG LVL3 (GOWN DISPOSABLE) ×2 IMPLANT
GOWN STRL REUS W/TWL LRG LVL3 (GOWN DISPOSABLE) ×2
LABEL OR SOLS (LABEL) ×2 IMPLANT
NEEDLE HYPO 22GX1.5 SAFETY (NEEDLE) ×2 IMPLANT
NS IRRIG 500ML POUR BTL (IV SOLUTION) ×2 IMPLANT
PACK BASIN MINOR ARMC (MISCELLANEOUS) ×2 IMPLANT
SPONGE LAP 18X18 RF (DISPOSABLE) ×2 IMPLANT
SUT MNCRL 4-0 (SUTURE) ×1
SUT MNCRL 4-0 27XMFL (SUTURE) ×1
SUT VIC AB 2-0 CT2 27 (SUTURE) ×4 IMPLANT
SUTURE MNCRL 4-0 27XMF (SUTURE) ×1 IMPLANT
SYR 10ML LL (SYRINGE) ×4 IMPLANT

## 2018-01-16 NOTE — Discharge Instructions (Addendum)

## 2018-01-16 NOTE — Anesthesia Post-op Follow-up Note (Signed)
Anesthesia QCDR form completed.        

## 2018-01-16 NOTE — Transfer of Care (Signed)
Immediate Anesthesia Transfer of Care Note  Patient: Kizzie FurnishRebecca Boldman  Procedure(s) Performed: EXCISION HIDRADENITIS GROIN (N/A )  Patient Location: PACU  Anesthesia Type:General  Level of Consciousness: awake, alert  and oriented  Airway & Oxygen Therapy: Patient Spontanous Breathing  Post-op Assessment: Report given to RN and Post -op Vital signs reviewed and stable  Post vital signs: Reviewed and stable  Last Vitals:  Vitals Value Taken Time  BP 102/70 01/16/2018  1:56 PM  Temp 36.7 C 01/16/2018  1:56 PM  Pulse 110 01/16/2018  1:56 PM  Resp 11 01/16/2018  1:56 PM  SpO2 98 % 01/16/2018  1:56 PM  Vitals shown include unvalidated device data.  Last Pain:  Vitals:   01/16/18 0926  TempSrc: Tympanic  PainSc: 4       Patients Stated Pain Goal: 1 (01/16/18 0926)  Complications: No apparent anesthesia complications

## 2018-01-16 NOTE — Anesthesia Procedure Notes (Signed)
Procedure Name: LMA Insertion Date/Time: 01/16/2018 1:14 PM Performed by: Conni SlipperHarris, William N, RN Pre-anesthesia Checklist: Patient identified, Patient being monitored, Timeout performed, Emergency Drugs available and Suction available Patient Re-evaluated:Patient Re-evaluated prior to induction Oxygen Delivery Method: Circle system utilized Preoxygenation: Pre-oxygenation with 100% oxygen Induction Type: IV induction Ventilation: Mask ventilation without difficulty LMA: LMA inserted LMA Size: 3.5 Tube type: Oral Number of attempts: 1 Placement Confirmation: positive ETCO2 and breath sounds checked- equal and bilateral Tube secured with: Tape Dental Injury: Teeth and Oropharynx as per pre-operative assessment

## 2018-01-16 NOTE — Anesthesia Preprocedure Evaluation (Signed)
Anesthesia Evaluation  Patient identified by MRN, date of birth, ID band Patient awake    Reviewed: Allergy & Precautions, NPO status , Patient's Chart, lab work & pertinent test results  History of Anesthesia Complications (+) history of anesthetic complications (migraines)  Airway Mallampati: III  TM Distance: <3 FB Neck ROM: Full    Dental no notable dental hx.    Pulmonary neg pulmonary ROS, neg sleep apnea, neg COPD,    breath sounds clear to auscultation- rhonchi (-) wheezing      Cardiovascular Exercise Tolerance: Good (-) hypertension(-) CAD, (-) Past MI, (-) Cardiac Stents and (-) CABG  Rhythm:Regular Rate:Normal - Systolic murmurs and - Diastolic murmurs    Neuro/Psych  Headaches, Seizures - (last seizure 2012), Well Controlled,  negative psych ROS   GI/Hepatic Neg liver ROS, GERD  ,  Endo/Other  negative endocrine ROSneg diabetes  Renal/GU negative Renal ROS     Musculoskeletal negative musculoskeletal ROS (+)   Abdominal (+) + obese,   Peds  Hematology negative hematology ROS (+)   Anesthesia Other Findings Past Medical History: 12/24/2017: Cerebral palsy (HCC) No date: Complication of anesthesia     Comment:  SEVERE MIGRAINES AFTER ANESTHESIA 12/24/2017: Epilepsy (HCC)     Comment:  stress, heat and random things bring on seizures 12/24/2017: Epilepsy associated with specific stimuli (HCC) 12/24/2017: 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) No date: GERD (gastroesophageal reflux disease) No date: Headache 12/24/2017: Hidradenitis suppurativa No date: History of kidney stones     Comment:  H/O 2008: History of methicillin resistant staphylococcus aureus (MRSA)     Comment:  positive from axilla, left No date: Hydradenitis No date: Seizures [facility](HCC)     Comment:  LAST SEIZURE 2012 12/24/2017: Status post VNS (vagus nerve stimulator) placement     Comment:  battery change  in 2019, inserted in 2002   Reproductive/Obstetrics                             Anesthesia Physical Anesthesia Plan  ASA: II  Anesthesia Plan: General   Post-op Pain Management:    Induction: Intravenous  PONV Risk Score and Plan: 2 and Ondansetron, Dexamethasone and Midazolam  Airway Management Planned: LMA  Additional Equipment:   Intra-op Plan:   Post-operative Plan:   Informed Consent: I have reviewed the patients History and Physical, chart, labs and discussed the procedure including the risks, benefits and alternatives for the proposed anesthesia with the patient or authorized representative who has indicated his/her understanding and acceptance.   Dental advisory given  Plan Discussed with: CRNA and Anesthesiologist  Anesthesia Plan Comments:         Anesthesia Quick Evaluation

## 2018-01-16 NOTE — Interval H&P Note (Signed)
History and Physical Interval Note:  01/16/2018 12:18 PM  Destiny Flores  has presented today for surgery, with the diagnosis of hidradenitis  The various methods of treatment have been discussed with the patient and family. After consideration of risks, benefits and other options for treatment, the patient has consented to  Procedure(s): EXCISION HIDRADENITIS AXILLA (N/A) as a surgical intervention .  The patient's history has been reviewed, patient examined, no change in status, stable for surgery.  I have reviewed the patient's chart and labs.  Questions were answered to the patient's satisfaction.     Nolin Grell F Briannah Lona

## 2018-01-16 NOTE — Op Note (Signed)
  01/16/2018  1:41 PM  PATIENT:  Destiny Flores  37 y.o. female  PRE-OPERATIVE DIAGNOSIS: Hidradenitis Left Groin  POST-OPERATIVE DIAGNOSIS:  Same  PROCEDURE:  Excision of complex hidradenitis   SURGEON:  Surgeon(s) and Role:    * Genea Rheaume F, MD - Primary  EBL: minimal  ANESTHESIA: GETA    DICTATION:  Patient was explained about the procedure in detail, risks, benefits and possible complications, a consent was obtained. The patient taken to the operating room and placed in the prone position.  There were multiple tracks and pits of hidradenitis, using a 15 blade knife elliptical incision was created to incorporate all the gross disease. Cautery was used to excise the sub q tissue. There were multiple and complex sinus tracks that were excised.   Hemostasis was obtained with electrocautery. Irrigation with normal saline and the wound was packed with half-inch packing. Marcaine quarter percent with epinephrine was injected around the wound site. Needle and laparotomy counts were correct and there were no immediate complications  Leafy Roiego F Idalie Canto, MD

## 2018-01-16 NOTE — Progress Notes (Signed)
Please note there has been a mistake on the location of the previous note since the case was posted for Hidradenitis of the axilla when is in fact of the Groin. D/W the pt in detail and confirmed location after doing PE. Plan for excision of hidradenitis of groin

## 2018-01-17 ENCOUNTER — Encounter: Payer: Self-pay | Admitting: Surgery

## 2018-01-20 ENCOUNTER — Other Ambulatory Visit: Payer: Self-pay | Admitting: *Deleted

## 2018-01-20 LAB — SURGICAL PATHOLOGY

## 2018-01-20 MED ORDER — GABAPENTIN 600 MG PO TABS: 600.0000 mg | ORAL_TABLET | Freq: Three times a day (TID) | ORAL | 0 refills | Status: DC

## 2018-01-20 NOTE — Anesthesia Postprocedure Evaluation (Addendum)
Anesthesia Post Note  Patient: Destiny FurnishRebecca Tiley  Procedure(s) Performed: EXCISION HIDRADENITIS GROIN (N/A )  Patient location during evaluation: PACU Anesthesia Type: General Level of consciousness: awake and alert Pain management: pain level controlled Vital Signs Assessment: post-procedure vital signs reviewed and stable Respiratory status: spontaneous breathing, nonlabored ventilation and respiratory function stable Cardiovascular status: blood pressure returned to baseline and stable Postop Assessment: no apparent nausea or vomiting Anesthetic complications: no     Last Vitals:  Vitals:   01/16/18 1451 01/16/18 1542  BP: 115/83 115/73  Pulse: 86 89  Resp: 16 16  Temp: 36.7 C   SpO2: 100% 100%    Last Pain:  Vitals:   01/16/18 1451  TempSrc: Oral  PainSc: 4                  Vanity Larsson Garry Heater Javier Mamone

## 2018-01-22 ENCOUNTER — Ambulatory Visit (INDEPENDENT_AMBULATORY_CARE_PROVIDER_SITE_OTHER): Payer: Medicaid Other | Admitting: Surgery

## 2018-01-22 ENCOUNTER — Encounter: Payer: Self-pay | Admitting: Surgery

## 2018-01-22 VITALS — BP 114/79 | HR 97 | Ht 64.0 in | Wt 235.0 lb

## 2018-01-22 DIAGNOSIS — L732 Hidradenitis suppurativa: Secondary | ICD-10-CM

## 2018-01-22 MED ORDER — OXYCODONE-ACETAMINOPHEN 5-325 MG PO TABS: 1.0000 | ORAL_TABLET | ORAL | 0 refills | Status: DC | PRN

## 2018-01-22 NOTE — Progress Notes (Signed)
S/p hidradenitis excision Doing well Some persistent pain AVSS  EP NAD Wound healing well, good granulation tissue , no infection  A/P Doing well Refill norco  RTC 3 weeks

## 2018-01-22 NOTE — Patient Instructions (Signed)
Return in three weeks.. 

## 2018-02-03 ENCOUNTER — Encounter: Payer: Medicaid Other | Admitting: Surgery

## 2018-02-10 ENCOUNTER — Ambulatory Visit (INDEPENDENT_AMBULATORY_CARE_PROVIDER_SITE_OTHER): Payer: Medicaid Other | Admitting: Surgery

## 2018-02-10 ENCOUNTER — Encounter: Payer: Self-pay | Admitting: Surgery

## 2018-02-10 VITALS — BP 130/84 | HR 79 | Temp 97.7°F | Wt 237.0 lb

## 2018-02-10 DIAGNOSIS — Z09 Encounter for follow-up examination after completed treatment for conditions other than malignant neoplasm: Secondary | ICD-10-CM

## 2018-02-10 NOTE — Patient Instructions (Signed)
Please continue to apply gauze on your axillas.   Please give us a call in case you have any questions or concerns.

## 2018-02-10 NOTE — Progress Notes (Signed)
S/p excision hidradenitis Doing well Some drainage No fevers  PE NAD Wound healing well. Good granulation. No infection. Bacitracin placed. Significant improvement   A/p Doing well Stop wet/dry Bacitracin and coverage w Dry dressing RTC prn

## 2018-03-06 ENCOUNTER — Encounter: Payer: Self-pay | Admitting: Family Medicine

## 2018-03-06 ENCOUNTER — Other Ambulatory Visit: Payer: Self-pay | Admitting: Family Medicine

## 2018-03-06 ENCOUNTER — Ambulatory Visit: Payer: Medicaid Other | Admitting: Family Medicine

## 2018-03-06 VITALS — BP 124/72 | HR 100 | Temp 98.3°F | Resp 16 | Ht 64.0 in | Wt 234.2 lb

## 2018-03-06 DIAGNOSIS — E559 Vitamin D deficiency, unspecified: Secondary | ICD-10-CM | POA: Diagnosis not present

## 2018-03-06 DIAGNOSIS — Z23 Encounter for immunization: Secondary | ICD-10-CM

## 2018-03-06 DIAGNOSIS — K219 Gastro-esophageal reflux disease without esophagitis: Secondary | ICD-10-CM | POA: Diagnosis not present

## 2018-03-06 DIAGNOSIS — Z6841 Body Mass Index (BMI) 40.0 and over, adult: Secondary | ICD-10-CM | POA: Diagnosis not present

## 2018-03-06 DIAGNOSIS — Z124 Encounter for screening for malignant neoplasm of cervix: Secondary | ICD-10-CM

## 2018-03-06 DIAGNOSIS — G40909 Epilepsy, unspecified, not intractable, without status epilepticus: Secondary | ICD-10-CM

## 2018-03-06 DIAGNOSIS — Z9689 Presence of other specified functional implants: Secondary | ICD-10-CM

## 2018-03-06 DIAGNOSIS — Z79899 Other long term (current) drug therapy: Secondary | ICD-10-CM

## 2018-03-06 DIAGNOSIS — L732 Hidradenitis suppurativa: Secondary | ICD-10-CM

## 2018-03-06 DIAGNOSIS — G809 Cerebral palsy, unspecified: Secondary | ICD-10-CM

## 2018-03-06 DIAGNOSIS — G43909 Migraine, unspecified, not intractable, without status migrainosus: Secondary | ICD-10-CM

## 2018-03-06 DIAGNOSIS — E66813 Obesity, class 3: Secondary | ICD-10-CM

## 2018-03-06 MED ORDER — OMEPRAZOLE 20 MG PO CPDR: 20.0000 mg | DELAYED_RELEASE_CAPSULE | Freq: Every day | ORAL | 0 refills | Status: DC

## 2018-03-06 MED ORDER — VERAPAMIL HCL ER 240 MG PO CP24
240.0000 mg | ORAL_CAPSULE | Freq: Every day | ORAL | 1 refills | Status: DC
Start: 2018-03-06 — End: 2019-04-10

## 2018-03-06 MED ORDER — RANITIDINE HCL 150 MG PO TABS: 150.0000 mg | ORAL_TABLET | Freq: Two times a day (BID) | ORAL | 0 refills | Status: DC | PRN

## 2018-03-06 NOTE — Progress Notes (Signed)
Name: Destiny Flores   MRN: [id number]    DOB: 1981-05-04   Date:03/06/2018       Progress Note  Subjective  Chief Complaint  Chief Complaint  Patient presents with  . Establish Care  . Referral    Neurology Dr. Valente David [facility], Dr Lesleigh Noe [facility], Dr  [facility]    HPI  Pt presents to establish care and for the following concerns:  Establish Care: She recently moved down from [place] with her family.  CP: She has cerebral palsey, some days her legs hurt more than the average person, LEFT side is weaker, has had botox injections for this and it did help, legs get more tired as the day goes on.  Taking 5mg  valium PRN (also uses this for migraines).  Epilepsy: Last [facility] was November 2013; Also notes a history of petit mal seizures/partial complex seizures.  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 Also has vagus nerve stimulator placed 2002 (last battery changes was 2017).  Migraines: She is prescribed valium 5mg  PRN; she is still getting a migraines about 2-3 times a month; has reglan Rx for nausea; takes Excedrin PRN OTC. Taking verapamil 240mg  24 hour tablet once daily - she needs refill of verapamil today - advised we will check labs, provide a bridge refill to get her to Neurology, and then defer to neurology for management.  Need for Cervical Cancer Screening: She has never had a Pap smear due to anatomical and body habitus difficulties.  She would like to discuss this with OB/GYN along with possibility that she may have PCOS.  Hidradenitis: She was taking Humira but her insurance changed and this became too expensive.  She did have a cyst removed from her pubic area recently by Dr. Everlene Farrier and she states that she is concerned about the wound healing - she was last seen 02/10/2018 and was advised to return PRN - she notes the wound has not been healing, has a slightly foul odor to it, and has small amount of purulent drainage over the last day or  so.  She is not on ABX at this time.  GERD: She is taking omeprazole 20mg ; reflux occurs mostly at night; thinks it is from her Felbatol.  Does not notice any food triggers. Discussed risk of long-term PPI use, we will try decreasing use by alternating with zantac.  Obesity: Body mass index is 40.2 kg/m. Weight Management History: Diet: Trying to eat more vegetables, less carbohydrates. Exercise: not active Co-Morbid Conditions: GERD; 2 or more of these conditions combined with BMI >30 is considered morbid obesity; is this diagnosis appropriate and/or added to patient's problem list? No  Patient Active Problem List   Diagnosis Date Noted  . Cerebral palsy (HCC) 12/24/2017  . Epilepsy (HCC) 12/24/2017  . 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 Hidradenitis suppurativa 12/24/2017  . 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 abscess surgery Bilateral 2006   armpits and groin.12 surgeries overall.   [facility] Kitchen AXILLARY HIDRADENITIS EXCISION     MULTIPLE HYDRADENITIS SURGERIES IN [place]  . hidradenitis groin Left 04/2017   last surgery done on groin and the symptoms have returned  . HYDRADENITIS EXCISION N/A 01/16/2018   Procedure: EXCISION HIDRADENITIS GROIN;  Surgeon: Leafy Ro, MD;  Location: [facility] ORS;  Service: General;  Laterality: N/A;  .  KNEE SURGERY Left 2010   tendon and meniscus repair  . NOSE SURGERY  1998   deviated septum and sinus repair  . TONSILLECTOMY    . TYMPANOSTOMY TUBE PLACEMENT     as a kid    Family History  Problem Relation Age of Onset  . Hyperlipidemia Father   . Epilepsy Brother   . Breast cancer Paternal Grandmother     Social History   Socioeconomic History  . Marital status: Single    Spouse name: Not on file  . Number of children: Not on file  . Years of  education: Not on file  . Highest education level: Not on file  Occupational History  . Occupation: unemployes  Social Needs  . Financial resource strain: Not hard at all  . Food insecurity:    Worry: Never true    Inability: Never true  . Transportation needs:    Medical: No    Non-medical: No  Tobacco Use  . Smoking status: Never Smoker  . Smokeless tobacco: Never Used  Substance and Sexual Activity  . Alcohol use: Never    Frequency: Never  . Drug use: Never  . Sexual activity: Never  Lifestyle  . Physical activity:    Days per week: 0 days    Minutes per session: 0 min  . Stress: Rather much  Relationships  . Social connections:    Talks on phone: More than three times a week    Gets together: More than three times a week    Attends religious service: Never    Active member of club or organization: No    Attends meetings of clubs or organizations: Never    Relationship status: Never married  . Intimate partner violence:    Fear of current or ex partner: No    Emotionally abused: No    Physically abused: No    Forced sexual activity: No  Other Topics Concern  . Not on file  Social History Narrative  . Not on file     Current Outpatient Medications:  .  acetaminophen (TYLENOL) 500 MG tablet, Take 1,000 mg by mouth every 6 (six) hours as needed for moderate pain or headache., Disp: , Rfl:  .  acetaZOLAMIDE (DIAMOX) 500 MG capsule, Take 500 mg by mouth at bedtime. , Disp: , Rfl:  .  aspirin-acetaminophen-caffeine (EXCEDRIN MIGRAINE) 250-250-65 MG tablet, Take 2 tablets by mouth every 6 (six) hours as needed for headache or migraine., Disp: , Rfl:  .  Cholecalciferol (VITAMIN D3) 50000 units CAPS, Take 50,000 Units by mouth every Sunday. , Disp: , Rfl:  .  diazepam (VALIUM) 5 MG tablet, Take 5 mg by mouth at bedtime as needed (for migraine). , Disp: , Rfl:  .  felbamate (FELBATOL) 400 MG tablet, Take 800 mg by mouth 2 (two) times daily. , Disp: , Rfl:  .  felbamate  (FELBATOL) 600 MG tablet, Take 600 mg by mouth daily after lunch. , Disp: , Rfl:  .  metoCLOPramide (REGLAN) 10 MG tablet, Take 10 mg by mouth daily as needed (for migraine). , Disp: , Rfl:  .  Multiple Vitamin (DAILY-VITE) TABS, Take 1 tablet by mouth daily. , Disp: , Rfl:  .  naproxen sodium (ALEVE) 220 MG tablet, Take 440 mg by mouth at bedtime as needed (for pain or headache)., Disp: , Rfl:  .  omeprazole (PRILOSEC) 20 MG capsule, Take 20 mg by mouth at bedtime. , Disp: , Rfl:  .  TURMERIC CURCUMIN PO,  Take 2 capsules by mouth daily., Disp: , Rfl:  .  verapamil (VERELAN PM) 240 MG 24 hr capsule, Take 240 mg by mouth at bedtime. , Disp: , Rfl:  .  zinc gluconate ([employer] ZINC) 50 MG tablet, Take 50 mg by mouth daily. , Disp: , Rfl:  .  gabapentin (NEURONTIN) 600 MG tablet, Take 1 tablet (600 mg total) by mouth 3 (three) times daily., Disp: 40 tablet, Rfl: 0 .  mupirocin ointment (BACTROBAN) 2 %, Apply to affected area 3 times daily (Patient not taking: Reported on 03/06/2018), Disp: 22 g, Rfl: 0 .  oxyCODONE-acetaminophen (PERCOCET) 5-325 MG tablet, Take 1-2 tablets by mouth every 4 (four) hours as needed for severe pain., Disp: 30 tablet, Rfl: 0  Allergies  Allergen Reactions  . Codeine Anaphylaxis  . Penicillins Anaphylaxis and Other (See Comments)    Has patient had a PCN reaction causing immediate rash, facial/tongue/throat swelling, SOB or lightheadedness with hypotension: Yes Has patient had a PCN reaction causing severe rash involving mucus membranes or skin necrosis: No Has patient had a PCN reaction that required hospitalization: Yes Has patient had a PCN reaction occurring within the last 10 years: No If all of the above answers are "NO", then may proceed with Cephalosporin use.   . Adhesive [Tape] Other (See Comments)    Large Boils-PAPER TAPE OK TO USE  . Sulfa Antibiotics Hives    ROS Constitutional: Negative for fever or weight change.  Respiratory: Negative for cough and  shortness of breath.   Cardiovascular: Negative for chest pain or palpitations.  Gastrointestinal: Negative for abdominal pain, no bowel changes.  Musculoskeletal: Negative for gait problem or joint swelling.  Skin: Negative for rash.  Neurological: Negative for dizziness or headache.  No other specific complaints in a complete review of systems (except as listed in HPI above).  Objective  Vitals:   03/06/18 1319  BP: 124/72  Pulse: 100  Resp: 16  Temp: 98.3 F (36.8 C)  TempSrc: Oral  SpO2: 99%  Weight: 234 lb 3.2 oz (106.2 kg)  Height: 5\' 4"  (1.626 m)   Body mass index is 40.2 kg/m.  Physical Exam Constitutional: Patient appears well-developed and well-nourished. No distress.  HENT: Head: Normocephalic and atraumatic. Ears: B TMs ok, no erythema or effusion; Nose: Nose normal. Mouth/Throat: Oropharynx is clear and moist. No oropharyngeal exudate.  Eyes: Conjunctivae and EOM are normal. Pupils are equal, round, and reactive to light. No scleral icterus.  Neck: Normal range of motion. Neck supple. No JVD present. No thyromegaly present.  Cardiovascular: Normal rate, regular rhythm and normal heart sounds.  No murmur heard. No BLE edema. Pulmonary/Chest: Effort normal and breath sounds normal. No respiratory distress. Abdominal: Soft. Bowel sounds are normal, no distension. There is no tenderness.  Musculoskeletal: Normal range of motion, no joint effusions. No gross deformities Neurological: he is alert and oriented to person, place, and time. No cranial nerve deficit. Coordination, balance, strength, speech and gait are normal.  Skin: Incision to left pubic area present with good granulation, however there is a small amount of purulent drainage, no underlying erythema, non-tender around the wound.  A culture is obtained. Psychiatric: Patient has a normal mood and affect. behavior is normal. Judgment and thought content normal.  No results found for this or any previous visit  (from the past 72 hour(s)).  PHQ2/9: Depression screen PHQ 2/9 03/06/2018  Decreased Interest 0  Down, Depressed, Hopeless 0  PHQ - 2 Score 0  Altered sleeping 0  Tired, decreased energy 0  Change in appetite 0  Feeling bad or failure about yourself  0  Trouble concentrating 0  Moving slowly or fidgety/restless 0  Suicidal thoughts 0  PHQ-9 Score 0  Difficult doing work/chores Not difficult at all   Fall Risk: Fall Risk  03/06/2018  Falls in the past year? No   Assessment & Plan  1. Cerebral palsy, unspecified type [facility]) - Ambulatory referral to Neurology  2. Nonintractable epilepsy without status epilepticus, unspecified epilepsy type [facility]) - Ambulatory referral to Neurology  3. Status post VNS (vagus nerve stimulator) placement - Ambulatory referral to Neurology  4. Migraine without status migrainosus, not intractable, unspecified migraine type - Ambulatory referral to Neurology - verapamil (VERELAN PM) 240 MG 24 hr capsule; Take 1 capsule (240 mg total) by mouth at bedtime.  Dispense: 30 capsule; Refill: 1  5. Class 3 severe obesity due to excess calories without serious comorbidity with body mass index (BMI) of 40.0 to 44.9 in adult [facility]) - Lipid panel  6. Long-term use of high-risk medication - CBC w/Diff/Platelet - COMPLETE METABOLIC PANEL WITH GFR  7. Screening for cervical cancer - Ambulatory referral to Obstetrics / Gynecology  8. Hidradenitis suppurativa - Ambulatory referral to Dermatology  9. Vitamin D deficiency - Vitamin D (25 hydroxy)  10. Gastroesophageal reflux disease without esophagitis - omeprazole (PRILOSEC) 20 MG capsule; Take 1 capsule (20 mg total) by mouth at bedtime.  Dispense: 90 capsule; Refill: 0 - ranitidine (ZANTAC) 150 MG tablet; Take 1 tablet (150 mg total) by mouth 2 (two) times daily as needed for heartburn.  Dispense: 90 tablet; Refill: 0  11. Needs flu shot - Flu Vaccine QUAD 6+ mos PF IM (Fluarix Quad PF)  12. Need for Tdap  vaccination - Tdap vaccine greater than or equal to 7yo IM  Return in about 3 months (around 06/05/2018) for 34mo follow up; also schedule CPE ASAP.

## 2018-03-06 NOTE — Patient Instructions (Addendum)
Please try alternating your Omeprazole and Ranitidine every other day.  Elevate your head of bed at night.  To help with reflux: - Elevate your head of bed - either with a few extra pillows, a wedge pillow, or by placing two bed risers under the head of bed posts. - Avoid the following foods: citrus, fatty foods, chocolate, peppermint, and excessive alcohol, along with sodas, orange juice (acidic drinks) - Stop eating at least 3 hours before going to bed, minimize naps/laying down after eating. - No smoking. - If you are overweight or obese, exercising and losing weight will also help your symptoms. - Caution: prolonged use of proton pump inhibitors like omeprazole (Prilosec), pantoprazole (Protonix), esomeprazole (Nexium), and others like Dexilant and Aciphex may increase your risk of pneumonia, Clostridium difficile colitis, osteoporosis, anemia and other health complications

## 2018-03-07 LAB — CBC WITH DIFFERENTIAL/PLATELET
BASOS ABS: 123 {cells}/uL (ref 0–200)
BASOS PCT: 1 %
EOS ABS: 357 {cells}/uL (ref 15–500)
Eosinophils Relative: 2.9 %
HCT: 42.8 % (ref 35.0–45.0)
Hemoglobin: 13.9 g/dL (ref 11.7–15.5)
Lymphs Abs: 2755 cells/uL (ref 850–3900)
MCH: 28.5 pg (ref 27.0–33.0)
MCHC: 32.5 g/dL (ref 32.0–36.0)
MCV: 87.9 fL (ref 80.0–100.0)
MONOS PCT: 6.3 %
MPV: 9.6 fL (ref 7.5–12.5)
NEUTROS ABS: 8290 {cells}/uL — AB (ref 1500–7800)
Neutrophils Relative %: 67.4 %
PLATELETS: 493 10*3/uL — AB (ref 140–400)
RBC: 4.87 10*6/uL (ref 3.80–5.10)
RDW: 12.7 % (ref 11.0–15.0)
TOTAL LYMPHOCYTE: 22.4 %
WBC mixed population: 775 cells/uL (ref 200–950)
WBC: 12.3 10*3/uL — ABNORMAL HIGH (ref 3.8–10.8)

## 2018-03-07 LAB — COMPLETE METABOLIC PANEL WITH GFR
AG RATIO: 1.4 (calc) (ref 1.0–2.5)
ALBUMIN MSPROF: 4.1 g/dL (ref 3.6–5.1)
ALKALINE PHOSPHATASE (APISO): 93 U/L (ref 33–115)
ALT: 13 U/L (ref 6–29)
AST: 13 U/L (ref 10–30)
BUN: 7 mg/dL (ref 7–25)
CO2: 23 mmol/L (ref 20–32)
CREATININE: 0.73 mg/dL (ref 0.50–1.10)
Calcium: 9.5 mg/dL (ref 8.6–10.2)
Chloride: 103 mmol/L (ref 98–110)
GFR, EST AFRICAN AMERICAN: 121 mL/min/{1.73_m2} (ref >=60)
GFR, EST NON AFRICAN AMERICAN: 104 mL/min/{1.73_m2} (ref >=60)
GLOBULIN: 2.9 g/dL (ref 1.9–3.7)
Glucose, Bld: 79 mg/dL (ref 65–139)
Potassium: 4.1 mmol/L (ref 3.5–5.3)
SODIUM: 137 mmol/L (ref 135–146)
Total Bilirubin: 0.2 mg/dL (ref 0.2–1.2)
Total Protein: 7 g/dL (ref 6.1–8.1)

## 2018-03-07 LAB — LIPID PANEL
CHOL/HDL RATIO: 4.2 (calc) (ref <5.0)
Cholesterol: 215 mg/dL — ABNORMAL HIGH (ref <200)
HDL: 51 mg/dL (ref >50)
LDL Cholesterol (Calc): 134 mg/dL (calc) — ABNORMAL HIGH
NON-HDL CHOLESTEROL (CALC): 164 mg/dL — AB (ref <130)
Triglycerides: 160 mg/dL — ABNORMAL HIGH (ref <150)

## 2018-03-07 LAB — VITAMIN D 25 HYDROXY (VIT D DEFICIENCY, FRACTURES): VIT D 25 HYDROXY: 28 ng/mL — AB (ref 30–100)

## 2018-03-10 ENCOUNTER — Telehealth: Payer: Self-pay | Admitting: Obstetrics & Gynecology

## 2018-03-10 ENCOUNTER — Other Ambulatory Visit: Payer: Self-pay | Admitting: Family Medicine

## 2018-03-10 ENCOUNTER — Telehealth: Payer: Self-pay | Admitting: Family Medicine

## 2018-03-10 ENCOUNTER — Encounter: Payer: Self-pay | Admitting: Family Medicine

## 2018-03-10 DIAGNOSIS — L732 Hidradenitis suppurativa: Secondary | ICD-10-CM

## 2018-03-10 DIAGNOSIS — B951 Streptococcus, group B, as the cause of diseases classified elsewhere: Secondary | ICD-10-CM

## 2018-03-10 DIAGNOSIS — R6250 Unspecified lack of expected normal physiological development in childhood: Secondary | ICD-10-CM | POA: Insufficient documentation

## 2018-03-10 MED ORDER — DOXYCYCLINE HYCLATE 100 MG PO TABS: 100.0000 mg | ORAL_TABLET | Freq: Two times a day (BID) | ORAL | 0 refills | Status: AC

## 2018-03-10 NOTE — Telephone Encounter (Signed)
[facility] referring for eval for need for Pap, also wants to discuss possible PCOS. Called and left voicemail for patient to call back to be  schedule

## 2018-03-10 NOTE — Telephone Encounter (Signed)
Pt called in and was given lab results message from Maurice Small, FNP dated 03/07/18 at 8:05am.   She had seen these results on Mychart also.  She declined wanting to see a hematologist at this time.    I routed this to Okey Regal nurse pool at [facility].      There was not a Result Note made so converted to a telephone encounter.

## 2018-03-13 NOTE — Telephone Encounter (Signed)
Called and left voice mail for patient to call back to be schedule °

## 2018-03-17 LAB — WOUND CULTURE
MICRO NUMBER: 91065719
SPECIMEN QUALITY: ADEQUATE

## 2018-03-24 ENCOUNTER — Telehealth: Payer: Self-pay | Admitting: Family Medicine

## 2018-03-24 NOTE — Telephone Encounter (Signed)
Phone number disconnected. Will send letter by mail.

## 2018-03-24 NOTE — Telephone Encounter (Signed)
OB/GYN has tried to contact Destiny Flores multiple times about her referral.  Please call to inquire if she would still like to see them.  If so, please provide the phone number to her so that she may call and make an appointment - [facility] was who we referred her to.

## 2018-04-01 NOTE — Telephone Encounter (Signed)
Left message for patient

## 2018-04-07 ENCOUNTER — Ambulatory Visit (INDEPENDENT_AMBULATORY_CARE_PROVIDER_SITE_OTHER): Payer: Medicaid Other | Admitting: Family Medicine

## 2018-04-07 ENCOUNTER — Encounter: Payer: Self-pay | Admitting: Family Medicine

## 2018-04-07 VITALS — BP 124/80 | HR 98 | Temp 98.2°F | Resp 16 | Ht 64.0 in | Wt 236.2 lb

## 2018-04-07 DIAGNOSIS — G40909 Epilepsy, unspecified, not intractable, without status epilepticus: Secondary | ICD-10-CM

## 2018-04-07 DIAGNOSIS — Z0000 Encounter for general adult medical examination without abnormal findings: Secondary | ICD-10-CM | POA: Diagnosis not present

## 2018-04-07 MED ORDER — ACETAZOLAMIDE ER 500 MG PO CP12: 500.0000 mg | ORAL_CAPSULE | Freq: Every day | ORAL | 0 refills | Status: DC

## 2018-04-07 NOTE — Progress Notes (Signed)
Name: Destiny Flores   MRN: [id number]    DOB: 1981/07/01   Date:04/07/2018       Progress Note  Subjective  Chief Complaint  Chief Complaint  Patient presents with  . Annual Exam    HPI  Patient presents for annual CPE and the following concerns.  She has been having difficulty getting in with Neurology - has appointment with Dr. Virgina Evener with [facility] in January - this was the soonest appointment she was able to otbain.  She is out of her Diamox - Thursday was the last dose.  She requests a refill of this medicaiton to bridge her to that appointment.  She has not had any recent seizures, previous Neurologist in [place] will not refill as she has transferred care.  She has been on Diamox for many years (since adolescence) and at the same dose, no concerns today. Recent labs showed normal CMP.  Diet: Diet sodas, rarely sweet beverages; she doesn't eat out much (once a month at most).  At home she has chicken; does not eat much fried food.  Trying to cut back on dairy.  Does try to eat frozen fruits and vegetables Exercise: Not exercising - does not have time right now.   USPSTF grade A and B recommendations    Office Visit from 04/07/2018 in [facility]  AUDIT-C Score  0     Depression:  Depression screen [facility] 2/9 04/07/2018 03/06/2018  Decreased Interest 0 0  Down, Depressed, Hopeless 0 0  PHQ - 2 Score 0 0  Altered sleeping 0 0  Tired, decreased energy 0 0  Change in appetite 0 0  Feeling bad or failure about yourself  0 0  Trouble concentrating 0 0  Moving slowly or fidgety/restless 0 0  Suicidal thoughts 0 0  PHQ-9 Score 0 0  Difficult doing work/chores Not difficult at all Not difficult at all   Hypertension: BP Readings from Last 3 Encounters:  04/07/18 124/80  03/06/18 124/72  02/10/18 130/84   Obesity: Wt Readings from Last 3 Encounters:  04/07/18 236 lb 3.2 oz (107.1 kg)  03/06/18 234 lb 3.2 oz (106.2 kg)  02/10/18 237 lb (107.5 kg)   BMI  Readings from Last 3 Encounters:  04/07/18 40.54 kg/m  03/06/18 40.20 kg/m  02/10/18 40.68 kg/m    Hep C Screening: Not indicated - not sexually active, will consider at routine screening in the future when other labs are due. STD testing and prevention (HIV/chl/gon/syphilis): Declines - she is not sexually active, has never been sexually active. Intimate partner violence: No concerns Sexual History/Pain during Intercourse:  Has never been sexually active. Menstrual History/LMP/Abnormal Bleeding: No abnormal vaginal bleeding; bleeding varies, but periods occur once a month. Incontinence Symptoms: no concerns.  Advanced Care Planning: A voluntary discussion about advance care planning including the explanation and discussion of advance directives.  Discussed health care proxy and Living will, and the patient was able to identify a health care proxy as Wendie Chess.  Patient does not have a living will at present time. If patient does have living will, I have requested they bring this to the clinic to be scanned in to their chart.  Breast cancer: PGM had Breast CA at 37yo - pt had early mammogram at previous PCP at 37yo - this was normal and she was told to follow up at age 26. No results found for: HMMAMMO  BRCA gene screening: Declines Cervical cancer screening: She was referred to GYN - [facility]  Osteoporosis Screening: Not indicated. No results found for: HMDEXASCAN  Lipids:  Lab Results  Component Value Date   CHOL 215 (H) 03/06/2018   Lab Results  Component Value Date   HDL 51 03/06/2018   Lab Results  Component Value Date   LDLCALC 134 (H) 03/06/2018   Lab Results  Component Value Date   TRIG 160 (H) 03/06/2018   Lab Results  Component Value Date   CHOLHDL 4.2 03/06/2018   No results found for: LDLDIRECT  Glucose:  Glucose, Bld  Date Value Ref Range Status  03/06/2018 79 65 - 139 mg/dL Final    Comment:    .        Non-fasting reference  interval .   01/14/2018 109 (H) 70 - 99 mg/dL Final    Comment:    Please note change in reference range.    Skin cancer: No concerning lesions Colorectal cancer: No changes in BM's - no constipation or diarrhea, no blood in stool or dark and tarry stools.  No family history of colorectal cancer Lung cancer:  Not indicated  Patient Active Problem List   Diagnosis Date Noted  . 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 Vitamin D deficiency 03/06/2018  . Cerebral palsy ([place]) 12/24/2017  . Epilepsy ([place]) 12/24/2017  . Hidradenitis suppurativa 12/24/2017  . 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 abscess surgery Bilateral 2006   armpits and groin.12 surgeries overall.   Marland Kitchen AXILLARY HIDRADENITIS EXCISION     MULTIPLE HYDRADENITIS SURGERIES IN [place]  . hidradenitis groin Left 04/2017   last surgery done on groin and the symptoms have returned  . HYDRADENITIS EXCISION N/A 01/16/2018   Procedure: EXCISION HIDRADENITIS GROIN;  Surgeon: Jules Husbands, MD;  Location: [facility] ORS;  Service: General;  Laterality: N/A;  . KNEE SURGERY Left 2010   tendon and meniscus repair  . NOSE SURGERY  1998   deviated septum and sinus repair  . TONSILLECTOMY    . TYMPANOSTOMY TUBE PLACEMENT     as a kid    Family History  Problem Relation Age of Onset  . Hyperlipidemia Father   . Epilepsy Brother   . Breast cancer Paternal Grandmother     Social History   Socioeconomic History  . Marital status: Single    Spouse name: Not on file  . Number of children: Not on file  . Years of education: Not on file  . Highest education level: Not on file  Occupational History  . Occupation: unemployeed  Social Needs  . Financial resource strain: Not hard at all  . Food insecurity:    Worry: Never true    Inability: Never true  . Transportation needs:    Medical: No    Non-medical: No  Tobacco Use  . Smoking status:  Never Smoker  . Smokeless tobacco: Never Used  Substance and Sexual Activity  . Alcohol use: Never    Frequency: Never  . Drug use: Never  . Sexual activity: Never  Lifestyle  . Physical activity:    Days per week: 0 days    Minutes per session: 0 min  . Stress: Rather much  Relationships  . Social connections:    Talks on phone: More than three times a week    Gets together: More than three times a week    Attends religious service: Never    Active member of club or organization: No  Attends meetings of clubs or organizations: Never    Relationship status: Never married  . Intimate partner violence:    Fear of current or ex partner: No    Emotionally abused: No    Physically abused: No    Forced sexual activity: No  Other Topics Concern  . Not on file  Social History Narrative  . Not on file     Current Outpatient Medications:  .  acetaminophen (TYLENOL) 500 MG tablet, Take 1,000 mg by mouth every 6 (six) hours as needed for moderate pain or headache., Disp: , Rfl:  .  acetaZOLAMIDE (DIAMOX) 500 MG capsule, Take 500 mg by mouth at bedtime. , Disp: , Rfl:  .  aspirin-acetaminophen-caffeine (EXCEDRIN MIGRAINE) 250-250-65 MG tablet, Take 2 tablets by mouth every 6 (six) hours as needed for headache or migraine., Disp: , Rfl:  .  diazepam (VALIUM) 5 MG tablet, Take 5 mg by mouth at bedtime as needed (for migraine). , Disp: , Rfl:  .  felbamate (FELBATOL) 400 MG tablet, Take 800 mg by mouth 2 (two) times daily. , Disp: , Rfl:  .  felbamate (FELBATOL) 600 MG tablet, Take 600 mg by mouth daily after lunch. , Disp: , Rfl:  .  metoCLOPramide (REGLAN) 10 MG tablet, Take 10 mg by mouth daily as needed (for migraine). , Disp: , Rfl:  .  Multiple Vitamin (DAILY-VITE) TABS, Take 1 tablet by mouth daily. , Disp: , Rfl:  .  naproxen sodium (ALEVE) 220 MG tablet, Take 440 mg by mouth at bedtime as needed (for pain or headache)., Disp: , Rfl:  .  omeprazole (PRILOSEC) 20 MG capsule, Take 1  capsule (20 mg total) by mouth at bedtime., Disp: 90 capsule, Rfl: 0 .  ranitidine (ZANTAC) 150 MG tablet, Take 1 tablet (150 mg total) by mouth 2 (two) times daily as needed for heartburn., Disp: 90 tablet, Rfl: 0 .  TURMERIC CURCUMIN PO, Take 2 capsules by mouth daily., Disp: , Rfl:  .  verapamil (VERELAN PM) 240 MG 24 hr capsule, Take 1 capsule (240 mg total) by mouth at bedtime., Disp: 30 capsule, Rfl: 1 .  zinc gluconate ([employer] ZINC) 50 MG tablet, Take 50 mg by mouth daily. , Disp: , Rfl:  .  mupirocin ointment (BACTROBAN) 2 %, Apply to affected area 3 times daily (Patient not taking: Reported on 03/06/2018), Disp: 22 g, Rfl: 0  Allergies  Allergen Reactions  . Codeine Anaphylaxis  . Penicillins Anaphylaxis and Other (See Comments)    Has patient had a PCN reaction causing immediate rash, facial/tongue/throat swelling, SOB or lightheadedness with hypotension: Yes Has patient had a PCN reaction causing severe rash involving mucus membranes or skin necrosis: No Has patient had a PCN reaction that required hospitalization: Yes Has patient had a PCN reaction occurring within the last 10 years: No If all of the above answers are "NO", then may proceed with Cephalosporin use.   . Adhesive [Tape] Other (See Comments)    Large Boils-PAPER TAPE OK TO USE  . Sulfa Antibiotics Hives     ROS  Constitutional: Negative for fever or weight change.  Respiratory: Negative for cough and shortness of breath.   Cardiovascular: Negative for chest pain or palpitations.  Gastrointestinal: Negative for abdominal pain, no bowel changes.  Musculoskeletal: Negative for gait problem or joint swelling.  Skin: Negative for rash. She has ongoing wound to the lower abdomen - will follow up with Dr. Dahlia Byes Neurological: Negative for dizziness or headache.  No  other specific complaints in a complete review of systems (except as listed in HPI above).  Objective  Vitals:   04/07/18 1017  BP: 124/80  Pulse: 98   Resp: 16  Temp: 98.2 F (36.8 C)  TempSrc: Oral  SpO2: 98%  Weight: 236 lb 3.2 oz (107.1 kg)  Height: '5\' 4"'$  (1.626 m)    Body mass index is 40.54 kg/m.  Physical Exam  Constitutional: Patient appears well-developed and well-nourished. No distress.  HENT: Head: Normocephalic and atraumatic. Ears: B TMs ok, no erythema or effusion; Nose: Nose normal. Mouth/Throat: Oropharynx is clear and moist. No oropharyngeal exudate.  Eyes: Conjunctivae and EOM are normal. Pupils are equal, round, and reactive to light. No scleral icterus.  Neck: Normal range of motion. Neck supple. No JVD present. No thyromegaly present.  Cardiovascular: Normal rate, regular rhythm and normal heart sounds.  No murmur heard. No BLE edema. Pulmonary/Chest: Effort normal and breath sounds normal. No respiratory distress. Abdominal: Soft. Bowel sounds are normal, no distension. There is no tenderness. no masses Breast: no lumps or masses, no nipple discharge or rashes FEMALE GENITALIA: Deferred Musculoskeletal: Normal range of motion, no joint effusions. No gross deformities Neurological: he is alert and oriented to person, place, and time. No cranial nerve deficit. Coordination, balance, strength, speech and gait are normal.  Skin: Skin is warm and dry. No rash noted. No erythema.  Psychiatric: Patient has a normal mood and affect. behavior is normal. Judgment and thought content normal.  Recent Results (from the past 2160 hour(s))  Surgical pcr screen     Status: None   Collection Time: 01/14/18 11:22 AM  Result Value Ref Range   MRSA, PCR NEGATIVE NEGATIVE   Staphylococcus aureus NEGATIVE NEGATIVE    Comment: (NOTE) The Xpert SA Assay (FDA approved for NASAL specimens in patients 64 years of age and older), is one component of a comprehensive surveillance program. It is not intended to diagnose infection nor to guide or monitor treatment. Performed at [facility], [address]  [place], [place] [address]   Basic metabolic panel     Status: Abnormal   Collection Time: 01/14/18 11:22 AM  Result Value Ref Range   Sodium 137 135 - 145 mmol/L   Potassium 3.6 3.5 - 5.1 mmol/L   Chloride 109 98 - 111 mmol/L    Comment: Please note change in reference range.   CO2 19 (L) 22 - 32 mmol/L   Glucose, Bld 109 (H) 70 - 99 mg/dL    Comment: Please note change in reference range.   BUN 17 6 - 20 mg/dL    Comment: Please note change in reference range.   Creatinine, Ser 0.51 0.44 - 1.00 mg/dL   Calcium 9.1 8.9 - 10.3 mg/dL   GFR calc non Af Amer >60 >60 mL/min   GFR calc Af Amer >60 >60 mL/min    Comment: (NOTE) The eGFR has been calculated using the CKD EPI equation. This calculation has not been validated in all clinical situations. eGFR's persistently <60 mL/min signify possible Chronic Kidney Disease.    Anion gap 9 5 - 15    Comment: Performed at [facility], [address] [place], [place] [address]  CBC WITH DIFFERENTIAL     Status: Abnormal   Collection Time: 01/14/18 11:22 AM  Result Value Ref Range   WBC 13.4 (H) 3.6 - 11.0 K/uL   RBC 4.62 3.80 - 5.20 MIL/uL   Hemoglobin 13.5 12.0 - 16.0 g/dL  HCT 39.5 35.0 - 47.0 %   MCV 85.3 80.0 - 100.0 fL   MCH 29.2 26.0 - 34.0 pg   MCHC 34.2 32.0 - 36.0 g/dL   RDW 13.0 11.5 - 14.5 %   Platelets 441 (H) 150 - 440 K/uL   Neutrophils Relative % 72 %   Neutro Abs 9.6 (H) 1.4 - 6.5 K/uL   Lymphocytes Relative 19 %   Lymphs Abs 2.6 1.0 - 3.6 K/uL   Monocytes Relative 6 %   Monocytes Absolute 0.8 0.2 - 0.9 K/uL   Eosinophils Relative 2 %   Eosinophils Absolute 0.3 0 - 0.7 K/uL   Basophils Relative 1 %   Basophils Absolute 0.1 0 - 0.1 K/uL    Comment: Performed at [facility], [address] [place], [place] [address]  Pregnancy, urine POC     Status: None   Collection Time: 01/16/18  9:57 AM  Result Value Ref Range   Preg Test, Ur NEGATIVE NEGATIVE    Comment:        THE SENSITIVITY OF  THIS METHODOLOGY IS >24 mIU/mL   Surgical pathology     Status: None   Collection Time: 01/16/18  1:40 PM  Result Value Ref Range   SURGICAL PATHOLOGY      Surgical Pathology CASE: [id number] PATIENT: Jamyiah Sozio Surgical Pathology Report     SPECIMEN SUBMITTED: A. Hidradenitis, left groin  CLINICAL HISTORY: None provided  PRE-OPERATIVE DIAGNOSIS: Hidradenitis  POST-OPERATIVE DIAGNOSIS: Same as pre-op     DIAGNOSIS: A.  SKIN AND SOFT TISSUE, LEFT GROIN; EXCISION: - CONSISTENT WITH HIDRADENITIS SUPPURATIVA.   GROSS DESCRIPTION: A. Labeled: Hidradenitis of groin Received: In formalin Tissue fragment(s): 6 Size: Aggregate, 4.1 x 3.2 x 1.1 cm Description: Tan to brown fragments of connective tissue and skin/mucosa Entirely submitted in one cassette.     Final Diagnosis performed by Delorse Lek, MD.   Electronically signed 01/20/2018 2:57:11PM The electronic signature indicates that the named Attending Pathologist has evaluated the specimen  Technical component performed at [facility], [address], [place], [place] [address] Lab: [phone] Dir: Rush Farmer, MD, MMM  Professional component perfo rmed at [facility], [facility], [address] [place], [place], [place] [address] Lab: [phone] Dir: Dellia Nims. Reuel Derby, MD   CBC w/Diff/Platelet     Status: Abnormal   Collection Time: 03/06/18  2:23 PM  Result Value Ref Range   WBC 12.3 (H) 3.8 - 10.8 Thousand/uL   RBC 4.87 3.80 - 5.10 Million/uL   Hemoglobin 13.9 11.7 - 15.5 g/dL   HCT 42.8 35.0 - 45.0 %   MCV 87.9 80.0 - 100.0 fL   MCH 28.5 27.0 - 33.0 pg   MCHC 32.5 32.0 - 36.0 g/dL   RDW 12.7 11.0 - 15.0 %   Platelets 493 (H) 140 - 400 Thousand/uL   MPV 9.6 7.5 - 12.5 fL   Neutro Abs 8,290 (H) 1,500 - 7,800 cells/uL   Lymphs Abs 2,755 850 - 3,900 cells/uL   WBC mixed population 775 200 - 950 cells/uL   Eosinophils Absolute 357 15 - 500 cells/uL   Basophils Absolute 123 0 - 200 cells/uL    Neutrophils Relative % 67.4 %   Total Lymphocyte 22.4 %   Monocytes Relative 6.3 %   Eosinophils Relative 2.9 %   Basophils Relative 1.0 %  COMPLETE METABOLIC PANEL WITH GFR     Status: None   Collection Time: 03/06/18  2:23 PM  Result Value Ref Range   Glucose,  Bld 79 65 - 139 mg/dL    Comment: .        Non-fasting reference interval .    BUN 7 7 - 25 mg/dL   Creat 0.73 0.50 - 1.10 mg/dL   GFR, Est Non African American 104 > OR = 60 mL/min/1.7m   GFR, Est African American 121 > OR = 60 mL/min/1.772m  BUN/Creatinine Ratio NOT APPLICABLE 6 - 22 (calc)   Sodium 137 135 - 146 mmol/L   Potassium 4.1 3.5 - 5.3 mmol/L   Chloride 103 98 - 110 mmol/L   CO2 23 20 - 32 mmol/L   Calcium 9.5 8.6 - 10.2 mg/dL   Total Protein 7.0 6.1 - 8.1 g/dL   Albumin 4.1 3.6 - 5.1 g/dL   Globulin 2.9 1.9 - 3.7 g/dL (calc)   AG Ratio 1.4 1.0 - 2.5 (calc)   Total Bilirubin 0.2 0.2 - 1.2 mg/dL   Alkaline phosphatase (APISO) 93 33 - 115 U/L   AST 13 10 - 30 U/L   ALT 13 6 - 29 U/L  Lipid panel     Status: Abnormal   Collection Time: 03/06/18  2:23 PM  Result Value Ref Range   Cholesterol 215 (H) <200 mg/dL   HDL 51 >50 mg/dL   Triglycerides 160 (H) <150 mg/dL   LDL Cholesterol (Calc) 134 (H) mg/dL (calc)    Comment: Reference range: <100 . Desirable range <100 mg/dL for primary prevention;   <70 mg/dL for patients with CHD or diabetic patients  with > or = 2 CHD risk factors. . Marland KitchenDL-C is now calculated using the Martin-Hopkins  calculation, which is a validated novel method providing  better accuracy than the Friedewald equation in the  estimation of LDL-C.  MaCresenciano Genret al. JAAnnamaria [id number];789([id number]  (http://education.[facility].com/faq/FAQ164)    Total CHOL/HDL Ratio 4.2 <5.0 (calc)   Non-HDL Cholesterol (Calc) 164 (H) <130 mg/dL (calc)    Comment: For patients with diabetes plus 1 major ASCVD risk  factor, treating to a non-HDL-C goal of <100 mg/dL  (LDL-C of <70 mg/dL) is  considered a therapeutic  option.   Vitamin D (25 hydroxy)     Status: Abnormal   Collection Time: 03/06/18  2:23 PM  Result Value Ref Range   Vit D, 25-Hydroxy 28 (L) 30 - 100 ng/mL    Comment: Vitamin D Status         25-OH Vitamin D: . Deficiency:                    <20 ng/mL Insufficiency:             20 - 29 ng/mL Optimal:                 > or = 30 ng/mL . For 25-OH Vitamin D testing on patients on  D2-supplementation and patients for whom quantitation  of D2 and D3 fractions is required, the QuestAssureD(TM) 25-OH VIT D, (D2,D3), LC/MS/MS is recommended: order  code [id number] >2y24yr . For more information on this test, go to: http://education.[facility].com/faq/FAQ163 (This link is being provided for  informational/educational purposes only.)   WOUND CULTURE     Status: Abnormal   Collection Time: 03/06/18  3:07 PM  Result Value Ref Range   MICRO NUMBER: [id number] SPECIMEN QUALITY: ADEQUATE    SOURCE: WOUND (SITE NOT SPECIFIED)    STATUS: FINAL    GRAM STAIN:      Few White blood cells  seen Few epithelial cells Few Gram positive cocci in pairs   ISOLATE 1: Streptococcus agalactiae (A)     Comment: Heavy growth of Group B Streptococcus isolated Beta-hemolytic Streptococci are predictably susceptible to penicillin and other beta-lactams. Susceptibility testing not routinely performed.    PHQ2/9: Depression screen [facility] 2/9 04/07/2018 03/06/2018  Decreased Interest 0 0  Down, Depressed, Hopeless 0 0  PHQ - 2 Score 0 0  Altered sleeping 0 0  Tired, decreased energy 0 0  Change in appetite 0 0  Feeling bad or failure about yourself  0 0  Trouble concentrating 0 0  Moving slowly or fidgety/restless 0 0  Suicidal thoughts 0 0  PHQ-9 Score 0 0  Difficult doing work/chores Not difficult at all Not difficult at all   Fall Risk: Fall Risk  04/07/2018 03/06/2018  Falls in the past year? No No   Assessment & Plan  1. Well woman exam (no gynecological  exam) -USPSTF grade A and B recommendations reviewed with patient; age-appropriate recommendations, preventive care, screening tests, etc discussed and encouraged; healthy living encouraged; see AVS for patient education given to patient -Discussed importance of 150 minutes of physical activity weekly, eat two servings of fish weekly, eat one serving of tree nuts ( cashews, pistachios, pecans, almonds.Marland Kitchen) every other day, eat 6 servings of fruit/vegetables daily and drink plenty of water and avoid sweet beverages.   2. Nonintractable epilepsy without status epilepticus, unspecified epilepsy type ([place]) - acetaZOLAMIDE (DIAMOX) 500 MG capsule; Take 1 capsule (500 mg total) by mouth at bedtime.  Dispense: 90 capsule; Refill: 0

## 2018-04-07 NOTE — Patient Instructions (Addendum)
Please call Dr. Dahlia Byes to schedule an appointment for re-evaluation of your abdominal wound. Preventive Care 18-39 Years, Female Preventive care refers to lifestyle choices and visits with your health care provider that can promote health and wellness. What does preventive care include?  A yearly physical exam. This is also called an annual well check.  Dental exams once or twice a year.  Routine eye exams. Ask your health care provider how often you should have your eyes checked.  Personal lifestyle choices, including: ? Daily care of your teeth and gums. ? Regular physical activity. ? Eating a healthy diet. ? Avoiding tobacco and drug use. ? Limiting alcohol use. ? Practicing safe sex. ? Taking vitamin and mineral supplements as recommended by your health care provider. What happens during an annual well check? The services and screenings done by your health care provider during your annual well check will depend on your age, overall health, lifestyle risk factors, and family history of disease. Counseling Your health care provider may ask you questions about your:  Alcohol use.  Tobacco use.  Drug use.  Emotional well-being.  Home and relationship well-being.  Sexual activity.  Eating habits.  Work and work Statistician.  Method of birth control.  Menstrual cycle.  Pregnancy history.  Screening You may have the following tests or measurements:  Height, weight, and BMI.  Diabetes screening. This is done by checking your blood sugar (glucose) after you have not eaten for a while (fasting).  Blood pressure.  Lipid and cholesterol levels. These may be checked every 5 years starting at age 21.  Skin check.  Hepatitis C blood test.  Hepatitis B blood test.  Sexually transmitted disease (STD) testing.  BRCA-related cancer screening. This may be done if you have a family history of breast, ovarian, tubal, or peritoneal cancers.  Pelvic exam and Pap test.  This may be done every 3 years starting at age 55. Starting at age 24, this may be done every 5 years if you have a Pap test in combination with an HPV test.  Discuss your test results, treatment options, and if necessary, the need for more tests with your health care provider. Vaccines Your health care provider may recommend certain vaccines, such as:  Influenza vaccine. This is recommended every year.  Tetanus, diphtheria, and acellular pertussis (Tdap, Td) vaccine. You may need a Td booster every 10 years.  Varicella vaccine. You may need this if you have not been vaccinated.  HPV vaccine. If you are 82 or younger, you may need three doses over 6 months.  Measles, mumps, and rubella (MMR) vaccine. You may need at least one dose of MMR. You may also need a second dose.  Pneumococcal 13-valent conjugate (PCV13) vaccine. You may need this if you have certain conditions and were not previously vaccinated.  Pneumococcal polysaccharide (PPSV23) vaccine. You may need one or two doses if you smoke cigarettes or if you have certain conditions.  Meningococcal vaccine. One dose is recommended if you are age 1-21 years and a first-year college student living in a residence hall, or if you have one of several medical conditions. You may also need additional booster doses.  Hepatitis A vaccine. You may need this if you have certain conditions or if you travel or work in places where you may be exposed to hepatitis A.  Hepatitis B vaccine. You may need this if you have certain conditions or if you travel or work in places where you may be exposed to  hepatitis B.  Haemophilus influenzae type b (Hib) vaccine. You may need this if you have certain risk factors.  Talk to your health care provider about which screenings and vaccines you need and how often you need them. This information is not intended to replace advice given to you by your health care provider. Make sure you discuss any questions you  have with your health care provider. Document Released: 08/14/2001 Document Revised: 03/07/2016 Document Reviewed: 04/19/2015 Elsevier Interactive Patient Education  [employer].

## 2018-04-28 ENCOUNTER — Encounter: Payer: Self-pay | Admitting: Family Medicine

## 2018-05-06 ENCOUNTER — Encounter: Payer: Self-pay | Admitting: Emergency Medicine

## 2018-05-06 ENCOUNTER — Emergency Department
Admission: EM | Admit: 2018-05-06 | Discharge: 2018-05-07 | Disposition: A | Discharge status: Home / Self Care | Payer: Medicaid Other | Attending: Emergency Medicine | Admitting: Emergency Medicine

## 2018-05-06 ENCOUNTER — Other Ambulatory Visit: Payer: Self-pay | Admitting: Family Medicine

## 2018-05-06 ENCOUNTER — Other Ambulatory Visit: Payer: Self-pay

## 2018-05-06 DIAGNOSIS — G809 Cerebral palsy, unspecified: Secondary | ICD-10-CM | POA: Insufficient documentation

## 2018-05-06 DIAGNOSIS — M303 Mucocutaneous lymph node syndrome [Kawasaki]: Secondary | ICD-10-CM | POA: Insufficient documentation

## 2018-05-06 DIAGNOSIS — Z79899 Other long term (current) drug therapy: Secondary | ICD-10-CM | POA: Insufficient documentation

## 2018-05-06 DIAGNOSIS — G43909 Migraine, unspecified, not intractable, without status migrainosus: Secondary | ICD-10-CM

## 2018-05-06 DIAGNOSIS — R1909 Other intra-abdominal and pelvic swelling, mass and lump: Secondary | ICD-10-CM | POA: Diagnosis present

## 2018-05-06 DIAGNOSIS — L03311 Cellulitis of abdominal wall: Secondary | ICD-10-CM | POA: Insufficient documentation

## 2018-05-06 LAB — CBC WITH DIFFERENTIAL/PLATELET
Abs Immature Granulocytes: 0.09 10*3/uL — ABNORMAL HIGH (ref 0.00–0.07)
Basophils Absolute: 0.1 10*3/uL (ref 0.0–0.1)
Basophils Relative: 1 %
Eosinophils Absolute: 0.3 10*3/uL (ref 0.0–0.5)
Eosinophils Relative: 2 %
HCT: 40.1 % (ref 36.0–46.0)
Hemoglobin: 13.4 g/dL (ref 12.0–15.0)
Immature Granulocytes: 1 %
Lymphocytes Relative: 24 %
Lymphs Abs: 3.5 10*3/uL (ref 0.7–4.0)
MCH: 28.8 pg (ref 26.0–34.0)
MCHC: 33.4 g/dL (ref 30.0–36.0)
MCV: 86.1 fL (ref 80.0–100.0)
Monocytes Absolute: 0.8 10*3/uL (ref 0.1–1.0)
Monocytes Relative: 6 %
Neutro Abs: 9.5 10*3/uL — ABNORMAL HIGH (ref 1.7–7.7)
Neutrophils Relative %: 66 %
Platelets: 492 10*3/uL — ABNORMAL HIGH (ref 150–400)
RBC: 4.66 MIL/uL (ref 3.87–5.11)
RDW: 13.2 % (ref 11.5–15.5)
WBC: 14.3 10*3/uL — ABNORMAL HIGH (ref 4.0–10.5)
nRBC: 0 % (ref 0.0–0.2)

## 2018-05-06 LAB — COMPREHENSIVE METABOLIC PANEL
ALT: 16 U/L (ref 0–44)
AST: 21 U/L (ref 15–41)
Albumin: 4.1 g/dL (ref 3.5–5.0)
Alkaline Phosphatase: 84 U/L (ref 38–126)
Anion gap: 9 (ref 5–15)
BUN: 11 mg/dL (ref 6–20)
CO2: 22 mmol/L (ref 22–32)
Calcium: 9.2 mg/dL (ref 8.9–10.3)
Chloride: 109 mmol/L (ref 98–111)
Creatinine, Ser: 0.59 mg/dL (ref 0.44–1.00)
GFR calc Af Amer: >60 mL/min (ref >60)
GFR calc non Af Amer: >60 mL/min (ref >60)
Glucose, Bld: 93 mg/dL (ref 70–99)
Potassium: 3.1 mmol/L — ABNORMAL LOW (ref 3.5–5.1)
Sodium: 140 mmol/L (ref 135–145)
Total Bilirubin: 0.4 mg/dL (ref 0.3–1.2)
Total Protein: 7.6 g/dL (ref 6.5–8.1)

## 2018-05-06 MED ORDER — NYSTATIN 100000 UNIT/GM EX CREA: 1.0000 "application " | TOPICAL_CREAM | Freq: Two times a day (BID) | CUTANEOUS | 0 refills | Status: DC

## 2018-05-06 MED ORDER — TRAMADOL HCL 50 MG PO TABS: 50.0000 mg | ORAL_TABLET | Freq: Four times a day (QID) | ORAL | 0 refills | Status: AC | PRN

## 2018-05-06 MED ORDER — CLINDAMYCIN HCL 300 MG PO CAPS: 300.0000 mg | ORAL_CAPSULE | Freq: Three times a day (TID) | ORAL | 0 refills | Status: AC

## 2018-05-06 MED ORDER — CLINDAMYCIN HCL 300 MG PO CAPS: 300.0000 mg | ORAL_CAPSULE | Freq: Three times a day (TID) | ORAL | 0 refills | Status: DC

## 2018-05-06 MED ORDER — TRAMADOL HCL 50 MG PO TABS: 50.0000 mg | ORAL_TABLET | Freq: Four times a day (QID) | ORAL | 0 refills | Status: DC | PRN

## 2018-05-06 MED ORDER — CLINDAMYCIN PHOSPHATE 600 MG/50ML IV SOLN
600.0000 mg | Freq: Once | INTRAVENOUS | Status: AC
  Administered 2018-05-06: 600 mg via INTRAVENOUS
  Filled 2018-05-06: qty 50

## 2018-05-06 NOTE — Telephone Encounter (Signed)
When is her neurology appointment? I advised I could only provide bridge Rx until she was established with neurology.

## 2018-05-06 NOTE — ED Triage Notes (Addendum)
Patient ambulatory to triage with steady gait, without difficulty or distress noted; pt reports abscess to pubic area for few wks; st hx of same and attempted to call her PCP for an antibiotic rx but they wanted to see her 1st

## 2018-05-07 NOTE — ED Provider Notes (Signed)
[facility] Emergency Department Provider Note  ____________________________________________  Time seen: Approximately 12:13 AM  I have reviewed the triage vital signs and the nursing notes.   HISTORY  Chief Complaint Abscess    HPI Bay Jarquin is a 36 y.o. female with a history of hidradenitis suppurativa, presents to the ED with cellulitis of the skin overlying the midline pelvis.  Patient reports that region has been spontaneously draining for approximately 24 hours.  She has had symptoms for approximately 1 week but has hesitated to seek care as she has recently started a new job at [employer] and did not want to miss work.  She denies fever or chills at home. Patient has had some mild nausea but no vomiting. No alleviating measures have been attempted.    Past Medical History:  Diagnosis Date  . Cerebral palsy (HCC) 12/24/2017  . Complication of anesthesia    SEVERE MIGRAINES AFTER ANESTHESIA  . Epilepsy (HCC) 12/24/2017   stress, heat and random things bring on seizures  . 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 GERD (gastroesophageal reflux disease)   . Headache   . Hidradenitis suppurativa 12/24/2017  . History of kidney stones    H/O  . History of methicillin resistant staphylococcus aureus (MRSA) 2008   positive from axilla, left  . Hx of Kawasaki's disease   . Hydradenitis   . Seizures (HCC)    LAST SEIZURE 2012  . 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 Vitamin D deficiency 03/06/2018  . Cerebral palsy (HCC) 12/24/2017  . Epilepsy (HCC) 12/24/2017  . Hidradenitis suppurativa 12/24/2017  . 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 abscess surgery Bilateral 2006   armpits and groin.12 surgeries overall.   Marland Kitchen AXILLARY HIDRADENITIS EXCISION     MULTIPLE HYDRADENITIS SURGERIES IN [place]  . hidradenitis groin Left 04/2017   last surgery done on groin and the symptoms have returned  . HYDRADENITIS EXCISION N/A 01/16/2018   Procedure: EXCISION HIDRADENITIS GROIN;  Surgeon: Leafy Ro, MD;  Location: [facility] ORS;  Service: General;  Laterality: N/A;  . KNEE SURGERY Left 2010   tendon and meniscus repair  . NOSE SURGERY  1998   deviated septum and sinus repair  . TONSILLECTOMY    . TYMPANOSTOMY TUBE PLACEMENT     as a kid    Prior to Admission medications   Medication Sig Start Date End Date Taking? Authorizing Provider  acetaminophen (TYLENOL) 500 MG tablet Take 1,000 mg by mouth every 6 (six) hours as needed for moderate pain or headache.    [provider]  acetaZOLAMIDE (DIAMOX) 500 MG capsule Take 1 capsule (500 mg total) by mouth at bedtime. 04/07/18   Doren Custard, FNP  aspirin-acetaminophen-caffeine (EXCEDRIN MIGRAINE) [phone] MG tablet Take 2 tablets by mouth every 6 (six) hours as needed for headache or migraine.    [provider]  clindamycin (CLEOCIN) 300 MG capsule Take 1 capsule (300 mg total) by mouth 3 (three) times daily for 10 days. 05/06/18 05/16/18  Orvil Feil, PA-C  diazepam (VALIUM) 5 MG tablet Take 5 mg by mouth at bedtime as needed (for migraine).  [provider]  felbamate (FELBATOL) 400 MG tablet Take 800 mg by mouth 2 (two) times daily.     [provider]  felbamate (FELBATOL) 600 MG tablet Take 600 mg by mouth daily after lunch.     [provider]  metoCLOPramide (REGLAN) 10 MG tablet Take 10 mg by mouth daily as needed (for migraine).     [provider]  Multiple Vitamin (DAILY-VITE) TABS Take 1 tablet by mouth daily.     [provider]  mupirocin ointment (BACTROBAN) 2 %  Apply to affected area 3 times daily Patient not taking: Reported on 03/06/2018 12/10/17   Faythe Ghee, PA-C  naproxen sodium (ALEVE) 220 MG tablet Take 440 mg by mouth at bedtime as needed (for pain or headache).    [provider]  nystatin cream (MYCOSTATIN) Apply 1 application topically 2 (two) times daily. 05/06/18   Orvil Feil, PA-C  omeprazole (PRILOSEC) 20 MG capsule Take 1 capsule (20 mg total) by mouth at bedtime. 03/06/18   Doren Custard, FNP  ranitidine (ZANTAC) 150 MG tablet Take 1 tablet (150 mg total) by mouth 2 (two) times daily as needed for heartburn. 03/06/18   Doren Custard, FNP  traMADol (ULTRAM) 50 MG tablet Take 1 tablet (50 mg total) by mouth every 6 (six) hours as needed for up to 3 days. 05/06/18 05/09/18  Orvil Feil, PA-C  TURMERIC CURCUMIN PO Take 2 capsules by mouth daily.    [provider]  verapamil (VERELAN PM) 240 MG 24 hr capsule Take 1 capsule (240 mg total) by mouth at bedtime. 03/06/18   Doren Custard, FNP  zinc gluconate ([employer] ZINC) 50 MG tablet Take 50 mg by mouth daily.     [provider]    Allergies Codeine; Penicillins; Adhesive [tape]; and Sulfa antibiotics  Family History  Problem Relation Age of Onset  . Hyperlipidemia Father   . Epilepsy Brother   . ADD / ADHD Brother   . Breast cancer Paternal Grandmother 47       Passed away at 36yo  . Asthma Mother     Social History Social History   Tobacco Use  . Smoking status: Never Smoker  . Smokeless tobacco: Never Used  Substance Use Topics  . Alcohol use: Never    Frequency: Never  . Drug use: Never     Review of Systems  Constitutional: No fever/chills Eyes: No visual changes. No discharge ENT: No upper respiratory complaints. Cardiovascular: no chest pain. Respiratory: no cough. No SOB. Gastrointestinal: No abdominal pain.  No nausea, no vomiting.  No diarrhea.  No constipation. Genitourinary: Negative for dysuria. No hematuria Musculoskeletal:  Negative for musculoskeletal pain. Skin: Patient has cellulitis.  Neurological: Negative for headaches, focal weakness or numbness.   ____________________________________________   PHYSICAL EXAM:  VITAL SIGNS: ED Triage Vitals  Enc Vitals Group     BP 05/06/18 2154 125/67     Pulse Rate 05/06/18 2154 80     Resp 05/06/18 2154 20     Temp 05/06/18 2154 97.6 F (36.4 C)     Temp Source 05/06/18 2154 Oral     SpO2 05/06/18 2154 98 %     Weight 05/06/18 2153 230 lb 12.8 oz (104.7 kg)     Height 05/06/18 2153 5\' 4"  (1.626 m)     Head Circumference --      Peak Flow --      Pain Score 05/06/18 2152 7  Pain Loc --      Pain Edu? --      Excl. in GC? --      Constitutional: Alert and oriented. Well appearing and in no acute distress. Eyes: Conjunctivae are normal. PERRL. EOMI. Head: Atraumatic. Cardiovascular: Normal rate, regular rhythm. Normal S1 and S2.  Good peripheral circulation. Respiratory: Normal respiratory effort without tachypnea or retractions. Lungs CTAB. Good air entry to the bases with no decreased or absent breath sounds. Gastrointestinal: Bowel sounds 4 quadrants. Soft and nontender to palpation. No guarding or rigidity. No palpable masses. No distention. No CVA tenderness. Musculoskeletal: Full range of motion to all extremities. No gross deformities appreciated. Neurologic:  Normal speech and language. No gross focal neurologic deficits are appreciated.  Skin: Skin underneath pannus is erythematous, approximately 4 cm in width by 6 cm in length with overlying yeast and satellite lesions.  Fissure in skin is spontaneously draining.  No palpable induration or fluctuance. Psychiatric: Mood and affect are normal. Speech and behavior are normal. Patient exhibits appropriate insight and judgement.   ____________________________________________   LABS (all labs ordered are listed, but only abnormal results are displayed)  Labs Reviewed  CBC WITH  DIFFERENTIAL/PLATELET - Abnormal; Notable for the following components:      Result Value   WBC 14.3 (*)    Platelets 492 (*)    Neutro Abs 9.5 (*)    Abs Immature Granulocytes 0.09 (*)    All other components within normal limits  COMPREHENSIVE METABOLIC PANEL - Abnormal; Notable for the following components:   Potassium 3.1 (*)    All other components within normal limits   ____________________________________________  EKG   ____________________________________________  RADIOLOGY   No results found.  ____________________________________________    PROCEDURES  Procedure(s) performed:    Procedures    Medications  clindamycin (CLEOCIN) IVPB 600 mg (0 mg Intravenous Stopped 05/07/18 0000)     ____________________________________________   INITIAL IMPRESSION / ASSESSMENT AND PLAN / ED COURSE  Pertinent labs & imaging results that were available during my care of the patient were reviewed by me and considered in my medical decision making (see chart for details).  Review of the [place] CSRS was performed in accordance of the [facility] prior to dispensing any controlled drugs.      Assessment and Plan: Cellulitis:  Cutaneous yeast Patient presents to the emergency department with cellulitis of the skin overlying the pelvis underneath pannus with spontaneous drainage.  CBC did reveal mild leukocytosis with associated left shift.  No palpable induration or fluctuance that would warrant incision and drainage.  Patient was given IV clindamycin in the emergency department and discharged with clindamycin.  Advised patient to incorporate probiotic/yogurt into her diet.  Strict return precautions were given to return for new or worsening symptoms.  All patient questions were answered.    ____________________________________________  FINAL CLINICAL IMPRESSION(S) / ED DIAGNOSES  Final diagnoses:  Cellulitis of abdominal wall      NEW MEDICATIONS STARTED DURING THIS  VISIT:  ED Discharge Orders         Ordered    clindamycin (CLEOCIN) 300 MG capsule  3 times daily,   Status:  Discontinued     05/06/18 2353    nystatin cream (MYCOSTATIN)  2 times daily,   Status:  Discontinued     05/06/18 2353    traMADol (ULTRAM) 50 MG tablet  Every 6 hours PRN,   Status:  Discontinued     05/06/18 2353    clindamycin (CLEOCIN)  300 MG capsule  3 times daily,   Status:  Discontinued     05/06/18 2354    nystatin cream (MYCOSTATIN)  2 times daily,   Status:  Discontinued     05/06/18 2354    traMADol (ULTRAM) 50 MG tablet  Every 6 hours PRN     05/06/18 2354    clindamycin (CLEOCIN) 300 MG capsule  3 times daily     05/06/18 2356    nystatin cream (MYCOSTATIN)  2 times daily     05/06/18 2356              This chart was dictated using voice recognition software/Dragon. Despite best efforts to proofread, errors can occur which can change the meaning. Any change was purely unintentional.    Orvil Feil, PA-C 05/07/18 0019    Minna Antis, MD 05/07/18 2257

## 2018-05-14 ENCOUNTER — Encounter: Payer: Self-pay | Admitting: Surgery

## 2018-05-14 ENCOUNTER — Other Ambulatory Visit: Payer: Self-pay

## 2018-05-14 ENCOUNTER — Ambulatory Visit: Payer: Medicaid Other | Admitting: Surgery

## 2018-05-14 VITALS — BP 115/78 | HR 88 | Temp 97.2°F | Resp 18 | Ht 64.0 in | Wt 229.0 lb

## 2018-05-14 DIAGNOSIS — L732 Hidradenitis suppurativa: Secondary | ICD-10-CM | POA: Diagnosis not present

## 2018-05-14 NOTE — Patient Instructions (Signed)
Patient is to return to the office as needed. Please call the office with any questions or concerns.

## 2018-05-15 ENCOUNTER — Encounter: Payer: Self-pay | Admitting: Surgery

## 2018-05-15 NOTE — Progress Notes (Signed)
Outpatient Surgical Follow Up  05/15/2018  Destiny Flores is an 37 y.o. female.   Chief Complaint  Patient presents with  . Follow-up    incision keeps opening up from surgery     HPI: The 67-year-old female well-known to my practice with a history of recurrent and recalcitrant hidradenitis.  I did patient several months ago and let the wound heal by secondary intention.  Few days ago she had some more pain and was prescribed antibiotics.  He is been draining some serous fluid.  No fevers no chills.  Some minimal sharp pains that are intermittent.  There is no specific alleviating or aggravating factors. No weight loss.  Past Medical History:  Diagnosis Date  . Cerebral palsy (HCC) 12/24/2017  . Complication of anesthesia    SEVERE MIGRAINES AFTER ANESTHESIA  . Epilepsy (HCC) 12/24/2017   stress, heat and random things bring on seizures  . 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 GERD (gastroesophageal reflux disease)   . Headache   . Hidradenitis suppurativa 12/24/2017  . History of kidney stones    H/O  . History of methicillin resistant staphylococcus aureus (MRSA) 2008   positive from axilla, left  . Hx of Kawasaki's disease   . Hydradenitis   . Seizures (HCC)    LAST SEIZURE 2012  . 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 abscess surgery Bilateral 2006   armpits and groin.12 surgeries overall.   Marland Kitchen AXILLARY HIDRADENITIS EXCISION     MULTIPLE [place]ADEN[place] SURGERIES IN [place]  . hidradenitis groin Left 04/2017   last surgery done on groin and the symptoms have returned  . HYDRADENITIS EXCISION N/A 01/16/2018   Procedure: EXCISION HIDRADENITIS GROIN;  Surgeon: Leafy Ro, MD;  Location: [facility] ORS;  Service: General;  Laterality: N/A;  . KNEE  SURGERY Left 2010   tendon and meniscus repair  . NOSE SURGERY  1998   deviated septum and sinus repair  . TONSILLECTOMY    . TYMPANOSTOMY TUBE PLACEMENT     as a kid    Family History  Problem Relation Age of Onset  . Hyperlipidemia Father   . Epilepsy Brother   . ADD / ADHD Brother   . Breast cancer Paternal Grandmother 109       Passed away at 36yo  . Asthma Mother     Social History:  reports that she has never smoked. She has never used smokeless tobacco. She reports that she does not drink alcohol or use drugs.  Allergies:  Allergies  Allergen Reactions  . Codeine Anaphylaxis  . Penicillins Anaphylaxis and Other (See Comments)    Has patient had a PCN reaction causing immediate rash, facial/tongue/throat swelling, SOB or lightheadedness with hypotension: Yes Has patient had a PCN reaction causing severe rash involving mucus membranes or skin necrosis: No Has patient had a PCN reaction that required hospitalization: Yes Has patient had a PCN reaction occurring within the last 10 years: No If all of the above answers are "NO", then may proceed with Cephalosporin use.   . Adhesive [Tape] Other (See Comments)    Large Boils-PAPER TAPE OK TO USE  . Sulfa Antibiotics Hives    Medications reviewed.    ROS Full ROS performed and is otherwise negative other than what is stated in HPI  BP 115/78   Pulse 88   Temp (!) 97.2 F (36.2 C) (Temporal)   Resp 18   Ht 5\' 4"  (1.626 m)   Wt 229 lb (103.9 kg)   LMP 04/20/2018 (Exact Date)   SpO2 98%   BMI 39.31 kg/m   Physical Exam  Constitutional: She is oriented to person, place, and time. She appears well-developed and well-nourished. No distress.  Neck: Normal range of motion. Neck supple. No tracheal deviation present. No thyromegaly present.  Pulmonary/Chest: Effort normal and breath sounds normal.  Abdominal: Soft. She exhibits no distension and no mass. There is no tenderness. There is no rebound and no guarding.   Small are on suprapubic area with some abrasion . There is good granulation tissue, no fluctuance or abscess.  Musculoskeletal: Normal range of motion. She exhibits no edema.  Neurological: She is alert and oriented to person, place, and time. She displays normal reflexes. No cranial nerve deficit. She exhibits normal muscle tone. Coordination normal.  Skin: Skin is warm and dry. Capillary refill takes less than 2 seconds.  Psychiatric: She has a normal mood and affect. Her behavior is normal. Thought content normal.  Nursing note and vitals reviewed.     Assessment/Plan:  1. Hidradenitis suppurativa No evidence of abscess or need for debridement at this time. Continue antibiotic therapy and good hygiene.  Sterling Bigiego Lynix Bonine, MD [facility] General Surgeon

## 2018-06-03 ENCOUNTER — Other Ambulatory Visit: Payer: Self-pay | Admitting: Family Medicine

## 2018-06-03 DIAGNOSIS — K219 Gastro-esophageal reflux disease without esophagitis: Secondary | ICD-10-CM

## 2018-06-05 ENCOUNTER — Ambulatory Visit: Payer: Medicaid Other | Admitting: Family Medicine

## 2018-06-05 ENCOUNTER — Telehealth: Payer: Self-pay | Admitting: Family Medicine

## 2018-06-05 ENCOUNTER — Encounter: Payer: Self-pay | Admitting: Family Medicine

## 2018-06-05 VITALS — BP 130/80 | HR 77 | Temp 98.1°F | Resp 16 | Ht 64.0 in | Wt 225.9 lb

## 2018-06-05 DIAGNOSIS — R1013 Epigastric pain: Secondary | ICD-10-CM

## 2018-06-05 DIAGNOSIS — R1011 Right upper quadrant pain: Secondary | ICD-10-CM

## 2018-06-05 DIAGNOSIS — G40909 Epilepsy, unspecified, not intractable, without status epilepticus: Secondary | ICD-10-CM

## 2018-06-05 DIAGNOSIS — R110 Nausea: Secondary | ICD-10-CM

## 2018-06-05 MED ORDER — FELBAMATE 400 MG PO TABS: 800.0000 mg | ORAL_TABLET | Freq: Two times a day (BID) | ORAL | 0 refills | Status: DC

## 2018-06-05 MED ORDER — METOCLOPRAMIDE HCL 10 MG PO TABS: 10.0000 mg | ORAL_TABLET | Freq: Every day | ORAL | 0 refills | Status: DC | PRN

## 2018-06-05 NOTE — Progress Notes (Signed)
Name: Destiny FurnishRebecca Flores   MRN: [id number]    DOB: 07/19/1980   Date:06/05/2018       Progress Note  Subjective  Chief Complaint  Chief Complaint  Patient presents with  . Abdominal Pain    possible gallbladder issues    HPI  RUQ abdominal pain - she notes has had several "attacks" in the past.  This episode lasted from 06/01/18 - 06/03/18. Symptoms included nausea, 1 episode of vomiting, and diarrhea along with RUQ abdominal pain. Denies hematuria, dysuria, hematochezia, or dark and tarry stools. Symptoms have resolved except for mild RUQ and epigastric pain along with nausea that is worse after eating. She does have Reglan on hand, but she is out - we will provide refill today - used to get this from neurology for her migraines, but does not have establish care appointment until January.  Seizures: She was finally able to obtain neurology appointment, but it is in January. She requests refill of Felbamate to get her through until her appointment. No recent seizures. CMP and CBC are being checked today.  Patient Active Problem List   Diagnosis Date Noted  . 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 Vitamin D deficiency 03/06/2018  . Cerebral palsy (HCC) 12/24/2017  . Epilepsy (HCC) 12/24/2017  . Hidradenitis suppurativa 12/24/2017  . 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 Smoking status: Never Smoker  . Smokeless tobacco: Never Used  Substance Use Topics  . Alcohol use: Never    Frequency: Never     Current Outpatient Medications:  .  acetaminophen (TYLENOL) 500 MG tablet, Take 1,000 mg by mouth every 6 (six) hours as needed for moderate pain or headache., Disp: , Rfl:  .  acetaZOLAMIDE (DIAMOX) 500 MG capsule, Take 1 capsule (500 mg total) by mouth at bedtime., Disp: 90 capsule, Rfl: 0 .  aspirin-acetaminophen-caffeine (EXCEDRIN MIGRAINE) 250-250-65 MG tablet, Take 2 tablets by mouth every 6 (six) hours as needed for  headache or migraine., Disp: , Rfl:  .  diazepam (VALIUM) 5 MG tablet, Take 5 mg by mouth at bedtime as needed (for migraine). , Disp: , Rfl:  .  felbamate (FELBATOL) 400 MG tablet, Take 800 mg by mouth 2 (two) times daily. , Disp: , Rfl:  .  felbamate (FELBATOL) 600 MG tablet, Take 600 mg by mouth daily after lunch. , Disp: , Rfl:  .  metoCLOPramide (REGLAN) 10 MG tablet, Take 10 mg by mouth daily as needed (for migraine). , Disp: , Rfl:  .  Multiple Vitamin (DAILY-VITE) TABS, Take 1 tablet by mouth daily. , Disp: , Rfl:  .  naproxen sodium (ALEVE) 220 MG tablet, Take 440 mg by mouth at bedtime as needed (for pain or headache)., Disp: , Rfl:  .  nystatin cream (MYCOSTATIN), Apply 1 application topically 2 (two) times daily., Disp: 30 g, Rfl: 0 .  omeprazole (PRILOSEC) 20 MG capsule, TAKE 1 CAPSULE (20 MG TOTAL) BY MOUTH AT BEDTIME., Disp: 90 capsule, Rfl: 0 .  ranitidine (ZANTAC) 150 MG tablet, Take 1 tablet (150 mg total) by mouth 2 (two) times daily as needed for heartburn., Disp: 90 tablet, Rfl: 0 .  TURMERIC CURCUMIN PO, Take 2 capsules by mouth daily., Disp: , Rfl:  .  verapamil (VERELAN PM) 240 MG 24 hr capsule, Take 1 capsule (240 mg total) by mouth at bedtime., Disp: 30 capsule, Rfl: 1 .  zinc gluconate ([employer] ZINC) 50 MG tablet, Take 50  mg by mouth daily. , Disp: , Rfl:  .  mupirocin ointment (BACTROBAN) 2 %, Apply to affected area 3 times daily, Disp: 22 g, Rfl: 0  Allergies  Allergen Reactions  . Codeine Anaphylaxis  . Penicillins Anaphylaxis and Other (See Comments)    Has patient had a PCN reaction causing immediate rash, facial/tongue/throat swelling, SOB or lightheadedness with hypotension: Yes Has patient had a PCN reaction causing severe rash involving mucus membranes or skin necrosis: No Has patient had a PCN reaction that required hospitalization: Yes Has patient had a PCN reaction occurring within the last 10 years: No If all of the above answers are "NO", then may proceed  with Cephalosporin use.   . Adhesive [Tape] Other (See Comments)    Large Boils-PAPER TAPE OK TO USE  . Sulfa Antibiotics Hives    I personally reviewed active problem list, medication list, health maintenance, notes from last encounter, lab results with the patient/caregiver today.  ROS  Ten systems reviewed and is negative except as mentioned in HPI.  Objective  Vitals:   06/05/18 0911 06/05/18 0913  BP: 130/80   Pulse: (!) 102 77  Resp: 16   Temp: 98.1 F (36.7 C)   TempSrc: Oral   SpO2: 99%   Weight: 225 lb 14.4 oz (102.5 kg)   Height: 5\' 4"  (1.626 m)      Body mass index is 38.78 kg/m.  Nursing Note and Vital Signs reviewed.  Physical Exam  Constitutional: Patient appears well-developed and well-nourished. No distress.  HENT: Head: Normocephalic and atraumatic. Nose: Nose normal. Mouth/Throat: Oropharynx is clear and moist. No oropharyngeal exudate or tonsillar swelling.  Eyes: Conjunctivae and EOM are normal. No scleral icterus. Neck: Normal range of motion. Neck supple. No JVD present. No thyromegaly present.  Cardiovascular: Normal rate, regular rhythm and normal heart sounds.  No murmur heard. No BLE edema. Pulmonary/Chest: Effort normal and breath sounds normal. No respiratory distress. Abdominal: Soft. Bowel sounds are normal, no distension. There is no tenderness. No masses or HSM, no CVA tenderness, equivocal murphy's sign. Musculoskeletal: Normal range of motion, no joint effusions. No gross deformities Neurological: Pt is alert and oriented to person, place, and time. No cranial nerve deficit. Coordination, balance, strength, speech and gait are normal.  Psychiatric: Patient has a normal mood and affect. behavior is normal. Judgment and thought content normal.  No results found for this or any previous visit (from the past 72 hour(s)).  Assessment & Plan  1. RUQ pain - We will send to GI if [place] negative; we will send to General surgery if US shows  gallbladder disease.   - Lipase - CBC - COMPLETE METABOLIC PANEL WITH GFR - Urinalysis, microscopic only - US Abdomen Limited RUQ; Future - Hepatitis panel, acute - Hepatitis C antibody - metoCLOPramide (REGLAN) 10 MG tablet; Take 1 tablet (10 mg total) by mouth daily as needed (for migraine).  Dispense: 20 tablet; Refill: 0  2. Epigastric pain - Lipase - metoCLOPramide (REGLAN) 10 MG tablet; Take 1 tablet (10 mg total) by mouth daily as needed (for migraine).  Dispense: 20 tablet; Refill: 0  3. Nonintractable epilepsy without status epilepticus, unspecified epilepsy type (HCC) - felbamate (FELBATOL) 400 MG tablet; Take 2 tablets (800 mg total) by mouth 2 (two) times daily.  Dispense: 60 tablet; Refill: 0  4. Nausea - metoCLOPramide (REGLAN) 10 MG tablet; Take 1 tablet (10 mg total) by mouth daily as needed (for migraine).  Dispense: 20 tablet; Refill: 0   -  Red flags and when to present for emergency care or RTC including fever >101.53F, chest pain, shortness of breath, new/worsening/un-resolving symptoms, severe abdominal pain,  vomiting, reviewed with patient at time of visit. Follow up and care instructions discussed and provided in AVS.

## 2018-06-05 NOTE — Telephone Encounter (Signed)
Pt was here today while computers were down. Please call to schedule a 1 month routine follow up. Thank you!

## 2018-06-05 NOTE — Telephone Encounter (Signed)
Appt Jan 2

## 2018-06-06 ENCOUNTER — Other Ambulatory Visit: Payer: Self-pay | Admitting: Family Medicine

## 2018-06-06 DIAGNOSIS — R8271 Bacteriuria: Secondary | ICD-10-CM

## 2018-06-06 LAB — COMPLETE METABOLIC PANEL WITH GFR
AG RATIO: 1.4 (calc) (ref 1.0–2.5)
ALKALINE PHOSPHATASE (APISO): 90 U/L (ref 33–115)
ALT: 25 U/L (ref 6–29)
AST: 22 U/L (ref 10–30)
Albumin: 3.9 g/dL (ref 3.6–5.1)
BILIRUBIN TOTAL: 0.4 mg/dL (ref 0.2–1.2)
BUN: 11 mg/dL (ref 7–25)
CHLORIDE: 105 mmol/L (ref 98–110)
CO2: 24 mmol/L (ref 20–32)
Calcium: 9.2 mg/dL (ref 8.6–10.2)
Creat: 0.59 mg/dL (ref 0.50–1.10)
GFR, EST AFRICAN AMERICAN: 135 mL/min/{1.73_m2} (ref >=60)
GFR, Est Non African American: 116 mL/min/{1.73_m2} (ref >=60)
Globulin: 2.7 g/dL (calc) (ref 1.9–3.7)
Glucose, Bld: 78 mg/dL (ref 65–99)
POTASSIUM: 4.2 mmol/L (ref 3.5–5.3)
Sodium: 138 mmol/L (ref 135–146)
Total Protein: 6.6 g/dL (ref 6.1–8.1)

## 2018-06-06 LAB — LIPASE: LIPASE: 15 U/L (ref 7–60)

## 2018-06-06 LAB — HEPATITIS C ANTIBODY
Hepatitis C Ab: NONREACTIVE
SIGNAL TO CUT-OFF: 0.02 (ref <1.00)

## 2018-06-06 LAB — URINALYSIS, MICROSCOPIC ONLY

## 2018-06-06 LAB — CBC
HCT: 39.5 % (ref 35.0–45.0)
Hemoglobin: 13.4 g/dL (ref 11.7–15.5)
MCH: 28.6 pg (ref 27.0–33.0)
MCHC: 33.9 g/dL (ref 32.0–36.0)
MCV: 84.4 fL (ref 80.0–100.0)
MPV: 9.7 fL (ref 7.5–12.5)
PLATELETS: 470 10*3/uL — AB (ref 140–400)
RBC: 4.68 10*6/uL (ref 3.80–5.10)
RDW: 12.8 % (ref 11.0–15.0)
WBC: 9.7 10*3/uL (ref 3.8–10.8)

## 2018-06-06 NOTE — Telephone Encounter (Signed)
Left a message to inform the patient Destiny Flores would bridge her medication until her appointment. Called to ask her if she had an appt yet with Neurologist.

## 2018-06-10 ENCOUNTER — Ambulatory Visit
Admission: RE | Admit: 2018-06-10 | Discharge: 2018-06-10 | Disposition: A | Discharge status: Home / Self Care | Payer: Medicaid Other | Source: Ambulatory Visit | Attending: Family Medicine | Admitting: Family Medicine

## 2018-06-10 DIAGNOSIS — K824 Cholesterolosis of gallbladder: Secondary | ICD-10-CM | POA: Diagnosis not present

## 2018-06-10 DIAGNOSIS — R1011 Right upper quadrant pain: Secondary | ICD-10-CM

## 2018-06-13 ENCOUNTER — Encounter: Payer: Self-pay | Admitting: Family Medicine

## 2018-06-16 ENCOUNTER — Other Ambulatory Visit: Payer: Self-pay | Admitting: Family Medicine

## 2018-06-16 ENCOUNTER — Telehealth: Payer: Self-pay | Admitting: Family Medicine

## 2018-06-16 DIAGNOSIS — K824 Cholesterolosis of gallbladder: Secondary | ICD-10-CM

## 2018-06-16 NOTE — Telephone Encounter (Signed)
Myriam JacobsonHelen - please call lab to check on hepatitis panel and urine culture.  If these need to be recollected, please call the patient.

## 2018-06-16 NOTE — Telephone Encounter (Signed)
No Hepatitis panel was ordered just Hepatitis C. No urine culture order on labs. Patient sippose to come back to give specimen but has not returned

## 2018-06-23 DIAGNOSIS — G40219 Localization-related (focal) (partial) symptomatic epilepsy and epileptic syndromes with complex partial seizures, intractable, without status epilepticus: Secondary | ICD-10-CM | POA: Diagnosis not present

## 2018-06-23 DIAGNOSIS — Z9689 Presence of other specified functional implants: Secondary | ICD-10-CM | POA: Diagnosis not present

## 2018-06-23 DIAGNOSIS — Z5181 Encounter for therapeutic drug level monitoring: Secondary | ICD-10-CM | POA: Diagnosis not present

## 2018-07-03 ENCOUNTER — Ambulatory Visit: Payer: Medicaid Other | Admitting: Family Medicine

## 2018-07-07 ENCOUNTER — Encounter: Payer: Self-pay | Admitting: Surgery

## 2018-07-07 ENCOUNTER — Ambulatory Visit (INDEPENDENT_AMBULATORY_CARE_PROVIDER_SITE_OTHER): Payer: Medicaid Other | Admitting: Surgery

## 2018-07-07 ENCOUNTER — Other Ambulatory Visit: Payer: Self-pay

## 2018-07-07 VITALS — BP 124/70 | HR 74 | Temp 97.8°F | Ht 65.0 in | Wt 229.0 lb

## 2018-07-07 DIAGNOSIS — K824 Cholesterolosis of gallbladder: Secondary | ICD-10-CM | POA: Diagnosis not present

## 2018-07-07 NOTE — Patient Instructions (Addendum)
The patient is scheduled for surgery at [facility] with Dr Everlene Farrier on 07/24/18. She will pre admit at the hospital and we will call her with that time and date. The patient is aware of date and instructions.   Laparoscopic Cholecystectomy Laparoscopic cholecystectomy is surgery to remove the gallbladder. The gallbladder is a pear-shaped organ that lies beneath the liver on the right side of the body. The gallbladder stores bile, which is a fluid that helps the body to digest fats. Cholecystectomy is often done for inflammation of the gallbladder (cholecystitis). This condition is usually caused by a buildup of gallstones (cholelithiasis) in the gallbladder. Gallstones can block the flow of bile, which can result in inflammation and pain. In severe cases, emergency surgery may be required. This procedure is done though small incisions in your abdomen (laparoscopic surgery). A thin scope with a camera (laparoscope) is inserted through one incision. Thin surgical instruments are inserted through the other incisions. In some cases, a laparoscopic procedure may be turned into a type of surgery that is done through a larger incision (open surgery). Tell a health care provider about:  Any allergies you have.  All medicines you are taking, including vitamins, herbs, eye drops, creams, and over-the-counter medicines.  Any problems you or family members have had with anesthetic medicines.  Any blood disorders you have.  Any surgeries you have had.  Any medical conditions you have.  Whether you are pregnant or may be pregnant. What are the risks? Generally, this is a safe procedure. However, problems may occur, including:  Infection.  Bleeding.  Allergic reactions to medicines.  Damage to other structures or organs.  A stone remaining in the common bile duct. The common bile duct carries bile from the gallbladder into the small intestine.  A bile leak from the cyst duct that is clipped when your  gallbladder is removed. What happens before the procedure? Staying hydrated Follow instructions from your health care provider about hydration, which may include:  Up to 2 hours before the procedure - you may continue to drink clear liquids, such as water, clear fruit juice, black coffee, and plain tea. Eating and drinking restrictions Follow instructions from your health care provider about eating and drinking, which may include:  8 hours before the procedure - stop eating heavy meals or foods such as meat, fried foods, or fatty foods.  6 hours before the procedure - stop eating light meals or foods, such as toast or cereal.  6 hours before the procedure - stop drinking milk or drinks that contain milk.  2 hours before the procedure - stop drinking clear liquids. Medicines  Ask your health care provider about: ? Changing or stopping your regular medicines. This is especially important if you are taking diabetes medicines or blood thinners. ? Taking medicines such as aspirin and ibuprofen. These medicines can thin your blood. Do not take these medicines before your procedure if your health care provider instructs you not to.  You may be given antibiotic medicine to help prevent infection. General instructions  Let your health care provider know if you develop a cold or an infection before surgery.  Plan to have someone take you home from the hospital or clinic.  Ask your health care provider how your surgical site will be marked or identified. What happens during the procedure?   To reduce your risk of infection: ? Your health care team will wash or sanitize their hands. ? Your skin will be washed with soap. ? Hair  may be removed from the surgical area.  An IV tube may be inserted into one of your veins.  You will be given one or more of the following: ? A medicine to help you relax (sedative). ? A medicine to make you fall asleep (general anesthetic).  A breathing tube  will be placed in your mouth.  Your surgeon will make several small cuts (incisions) in your abdomen.  The laparoscope will be inserted through one of the small incisions. The camera on the laparoscope will send images to a TV screen (monitor) in the operating room. This lets your surgeon see inside your abdomen.  Air-like gas will be pumped into your abdomen. This will expand your abdomen to give the surgeon more room to perform the surgery.  Other tools that are needed for the procedure will be inserted through the other incisions. The gallbladder will be removed through one of the incisions.  Your common bile duct may be examined. If stones are found in the common bile duct, they may be removed.  After your gallbladder has been removed, the incisions will be closed with stitches (sutures), staples, or skin glue.  Your incisions may be covered with a bandage (dressing). The procedure may vary among health care providers and hospitals. What happens after the procedure?  Your blood pressure, heart rate, breathing rate, and blood oxygen level will be monitored until the medicines you were given have worn off.  You will be given medicines as needed to control your pain.  Do not drive for 24 hours if you were given a sedative. This information is not intended to replace advice given to you by your health care provider. Make sure you discuss any questions you have with your health care provider. Document Released: 06/18/2005 Document Revised: 05/16/2017 Document Reviewed: 12/05/2015 Elsevier Interactive Patient Education  2019 [employer].

## 2018-07-07 NOTE — H&P (View-Only) (Signed)
Outpatient Surgical Follow Up  07/07/2018  Destiny Flores is an 38 y.o. female.   Chief Complaint  Patient presents with  . Other    HPI: 2 well know to me for hidradenitis now comes with intermittent RUQ pain for the last couple of years. Pain is moderate, aggravated by fatty meals, localted RUQ, and sharp. Goes away with time. No fevers, chills, diarrhea or constipation. She has had two immediate family member w GB issues that responded well to cholecystectomy. She is able to perform more than 4 METS. U/S pers. Reviewed showing 4 mm polyp. No stones , normal CBD, CBC and LFT are nml. No evidence of biliary obstruction or cholangitis.  Past Medical History:  Diagnosis Date  . Cerebral palsy (HCC) 12/24/2017  . Complication of anesthesia    SEVERE MIGRAINES AFTER ANESTHESIA  . Epilepsy (HCC) 12/24/2017   stress, heat and random things bring on seizures  . 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 GERD (gastroesophageal reflux disease)   . Headache   . Hidradenitis suppurativa 12/24/2017  . History of kidney stones    H/O  . History of methicillin resistant staphylococcus aureus (MRSA) 2008   positive from axilla, left  . Hx of Kawasaki's disease   . Hydradenitis   . Seizures (HCC)    LAST SEIZURE 2012  . 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 abscess surgery Bilateral 2006   armpits and groin.12 surgeries overall.   Marland Kitchen AXILLARY HIDRADENITIS EXCISION     MULTIPLE HYDRADENITIS SURGERIES IN [place]  . hidradenitis groin Left 04/2017   last surgery done on groin and the symptoms have returned  . HYDRADENITIS EXCISION N/A 01/16/2018   Procedure: EXCISION HIDRADENITIS GROIN;  Surgeon: Leafy Ro, MD;  Location: [facility] ORS;  Service: General;  Laterality:  N/A;  . KNEE SURGERY Left 2010   tendon and meniscus repair  . NOSE SURGERY  1998   deviated septum and sinus repair  . TONSILLECTOMY    . TYMPANOSTOMY TUBE PLACEMENT     as a kid    Family History  Problem Relation Age of Onset  . Hyperlipidemia Father   . Epilepsy Brother   . ADD / ADHD Brother   . Breast cancer Paternal Grandmother 14       Passed away at 36yo  . Asthma Mother     Social History:  reports that she has never smoked. She has never used smokeless tobacco. She reports that she does not drink alcohol or use drugs.  Allergies:  Allergies  Allergen Reactions  . Codeine Anaphylaxis  . Penicillins Anaphylaxis and Other (See Comments)    Has patient had a PCN reaction causing immediate rash, facial/tongue/throat swelling, SOB or lightheadedness with hypotension: Yes Has patient had a PCN reaction causing severe rash involving mucus membranes or skin necrosis: No Has patient had a PCN reaction that required hospitalization: Yes Has patient had a PCN reaction occurring within the last 10 years: No If all of the above answers are "NO", then may proceed with Cephalosporin use.   . Adhesive [Tape] Other (See Comments)    Large Boils-PAPER TAPE OK TO USE  . Sulfa Antibiotics Hives    Medications reviewed.    ROS Full ROS performed and is otherwise negative other than what is stated  in HPI   BP 124/70   Pulse 74   Temp 97.8 F (36.6 C) (Skin)   Ht 5\' 5"  (1.651 m)   Wt 229 lb (103.9 kg)   SpO2 98%   BMI 38.11 kg/m   Physical Exam Vitals signs and nursing note reviewed. Exam conducted with a chaperone present.  Constitutional:      General: She is not in acute distress.    Appearance: Normal appearance. She is obese. She is not ill-appearing.  HENT:     Head: Normocephalic and atraumatic.     Mouth/Throat:     Mouth: Mucous membranes are dry.  Eyes:     General:        Right eye: No discharge.        Left eye: No discharge.     Extraocular  Movements: Extraocular movements intact.  Neck:     Musculoskeletal: Normal range of motion and neck supple. No muscular tenderness.  Cardiovascular:     Pulses: Normal pulses.     Heart sounds: Normal heart sounds. No murmur. No friction rub.  Pulmonary:     Effort: Pulmonary effort is normal.     Breath sounds: Normal breath sounds. No stridor. No wheezing or rhonchi.  Chest:     Chest wall: No tenderness.  Abdominal:     Palpations: Abdomen is soft.     Tenderness: There is abdominal tenderness. There is no right CVA tenderness, left CVA tenderness, guarding or rebound.     Hernia: No hernia is present.     Comments: RUQ tenderness, no peritonitis, no murphy  Musculoskeletal: Normal range of motion.        General: No swelling.  Skin:    General: Skin is warm and dry.     Coloration: Skin is not jaundiced.  Neurological:     General: No focal deficit present.     Mental Status: She is alert and oriented to person, place, and time.  Psychiatric:        Mood and Affect: Mood normal.        Behavior: Behavior normal.        Thought Content: Thought content normal.        Judgment: Judgment normal.      Assessment/Plan: Biliary colic likely from GB polyp. D/W the pt in detail about the chance that even after cholecystectomy she would have persistent symptoms. Offered her GI evaluation and a wait and see approach vs proceeding w cholecystectomy. She is tired and wants to try to have CCY   The risks, benefits, complications, treatment options, and expected outcomes were discussed with the patient. The possibilities of bleeding, recurrent infection, finding a normal gallbladder, perforation of viscus organs, damage to surrounding structures, bile leak, abscess formation, needing a drain placed, the need for additional procedures, reaction to medication, pulmonary aspiration,  failure to diagnose a condition, the possible need to convert to an open procedure, and creating a complication  requiring transfusion or operation were discussed with the patient. The patient and/or family concurred with the proposed plan, giving informed consent.  She would be a good candidate for robotic approach given her body habitus.  Sterling Big, MD [facility] General Surgeon

## 2018-07-07 NOTE — Progress Notes (Signed)
Outpatient Surgical Follow Up  07/07/2018  Destiny Flores is an 38 y.o. female.   Chief Complaint  Patient presents with  . Other    HPI: 2 well know to me for hidradenitis now comes with intermittent RUQ pain for the last couple of years. Pain is moderate, aggravated by fatty meals, localted RUQ, and sharp. Goes away with time. No fevers, chills, diarrhea or constipation. She has had two immediate family member w GB issues that responded well to cholecystectomy. She is able to perform more than 4 METS. U/S pers. Reviewed showing 4 mm polyp. No stones , normal CBD, CBC and LFT are nml. No evidence of biliary obstruction or cholangitis.  Past Medical History:  Diagnosis Date  . Cerebral palsy (HCC) 12/24/2017  . Complication of anesthesia    SEVERE MIGRAINES AFTER ANESTHESIA  . Epilepsy (HCC) 12/24/2017   stress, heat and random things bring on seizures  . 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 GERD (gastroesophageal reflux disease)   . Headache   . Hidradenitis suppurativa 12/24/2017  . History of kidney stones    H/O  . History of methicillin resistant staphylococcus aureus (MRSA) 2008   positive from axilla, left  . Hx of Kawasaki's disease   . Hydradenitis   . Seizures (HCC)    LAST SEIZURE 2012  . 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 abscess surgery Bilateral 2006   armpits and groin.12 surgeries overall.   Marland Kitchen AXILLARY HIDRADENITIS EXCISION     MULTIPLE HYDRADENITIS SURGERIES IN [place]  . hidradenitis groin Left 04/2017   last surgery done on groin and the symptoms have returned  . HYDRADENITIS EXCISION N/A 01/16/2018   Procedure: EXCISION HIDRADENITIS GROIN;  Surgeon: Leafy Ro, MD;  Location: [facility] ORS;  Service: General;  Laterality:  N/A;  . KNEE SURGERY Left 2010   tendon and meniscus repair  . NOSE SURGERY  1998   deviated septum and sinus repair  . TONSILLECTOMY    . TYMPANOSTOMY TUBE PLACEMENT     as a kid    Family History  Problem Relation Age of Onset  . Hyperlipidemia Father   . Epilepsy Brother   . ADD / ADHD Brother   . Breast cancer Paternal Grandmother 14       Passed away at 36yo  . Asthma Mother     Social History:  reports that she has never smoked. She has never used smokeless tobacco. She reports that she does not drink alcohol or use drugs.  Allergies:  Allergies  Allergen Reactions  . Codeine Anaphylaxis  . Penicillins Anaphylaxis and Other (See Comments)    Has patient had a PCN reaction causing immediate rash, facial/tongue/throat swelling, SOB or lightheadedness with hypotension: Yes Has patient had a PCN reaction causing severe rash involving mucus membranes or skin necrosis: No Has patient had a PCN reaction that required hospitalization: Yes Has patient had a PCN reaction occurring within the last 10 years: No If all of the above answers are "NO", then may proceed with Cephalosporin use.   . Adhesive [Tape] Other (See Comments)    Large Boils-PAPER TAPE OK TO USE  . Sulfa Antibiotics Hives    Medications reviewed.    ROS Full ROS performed and is otherwise negative other than what is stated  in HPI   BP 124/70   Pulse 74   Temp 97.8 F (36.6 C) (Skin)   Ht 5' 5" (1.651 m)   Wt 229 lb (103.9 kg)   SpO2 98%   BMI 38.11 kg/m   Physical Exam Vitals signs and nursing note reviewed. Exam conducted with a chaperone present.  Constitutional:      General: She is not in acute distress.    Appearance: Normal appearance. She is obese. She is not ill-appearing.  HENT:     Head: Normocephalic and atraumatic.     Mouth/Throat:     Mouth: Mucous membranes are dry.  Eyes:     General:        Right eye: No discharge.        Left eye: No discharge.     Extraocular  Movements: Extraocular movements intact.  Neck:     Musculoskeletal: Normal range of motion and neck supple. No muscular tenderness.  Cardiovascular:     Pulses: Normal pulses.     Heart sounds: Normal heart sounds. No murmur. No friction rub.  Pulmonary:     Effort: Pulmonary effort is normal.     Breath sounds: Normal breath sounds. No stridor. No wheezing or rhonchi.  Chest:     Chest wall: No tenderness.  Abdominal:     Palpations: Abdomen is soft.     Tenderness: There is abdominal tenderness. There is no right CVA tenderness, left CVA tenderness, guarding or rebound.     Hernia: No hernia is present.     Comments: RUQ tenderness, no peritonitis, no murphy  Musculoskeletal: Normal range of motion.        General: No swelling.  Skin:    General: Skin is warm and dry.     Coloration: Skin is not jaundiced.  Neurological:     General: No focal deficit present.     Mental Status: She is alert and oriented to person, place, and time.  Psychiatric:        Mood and Affect: Mood normal.        Behavior: Behavior normal.        Thought Content: Thought content normal.        Judgment: Judgment normal.      Assessment/Plan: Biliary colic likely from GB polyp. D/W the pt in detail about the chance that even after cholecystectomy she would have persistent symptoms. Offered her GI evaluation and a wait and see approach vs proceeding w cholecystectomy. She is tired and wants to try to have CCY   The risks, benefits, complications, treatment options, and expected outcomes were discussed with the patient. The possibilities of bleeding, recurrent infection, finding a normal gallbladder, perforation of viscus organs, damage to surrounding structures, bile leak, abscess formation, needing a drain placed, the need for additional procedures, reaction to medication, pulmonary aspiration,  failure to diagnose a condition, the possible need to convert to an open procedure, and creating a complication  requiring transfusion or operation were discussed with the patient. The patient and/or family concurred with the proposed plan, giving informed consent.  She would be a good candidate for robotic approach given her body habitus.   , MD FACS General Surgeon 

## 2018-07-08 ENCOUNTER — Telehealth: Payer: Self-pay

## 2018-07-08 NOTE — Telephone Encounter (Signed)
The patient will pre admit at the hospital in the [facility] on 07/16/18 at 10:30 am. Message left for the patient to call back to get this information.

## 2018-07-08 NOTE — Telephone Encounter (Signed)
Patient notified of date and time.  

## 2018-07-16 ENCOUNTER — Other Ambulatory Visit: Payer: Self-pay

## 2018-07-16 ENCOUNTER — Encounter
Admission: RE | Admit: 2018-07-16 | Discharge: 2018-07-16 | Disposition: A | Discharge status: Home / Self Care | Payer: Medicaid Other | Source: Ambulatory Visit | Attending: Surgery | Admitting: Surgery

## 2018-07-16 NOTE — Patient Instructions (Signed)
  Your procedure is scheduled on: Thursday July 24, 2018 Report to Same Day Surgery 2nd floor [facility] Entrance-take elevator on left to 2nd floor.  Check in with surgery information desk.) To find out your arrival time, call [phone] 1:00-3:00 PM on Wednesday July 23, 2018  Remember: Instructions that are not followed completely may result in serious medical risk, up to and including death, or upon the discretion of your surgeon and anesthesiologist your surgery may need to be rescheduled.    __x__ 1. Do not eat food (including mints, candies, chewing gum) after midnight the night before your procedure. You may drink clear liquids up to 2 hours before you are scheduled to arrive at the hospital for your procedure.  Do not drink anything within 2 hours of your scheduled arrival to the hospital.  Approved clear liquids:  --Water or Apple juice without pulp  --Clear carbohydrate beverage such as Gatorade or Powerade  --Black Coffee or Clear Tea (No milk, no creamers, do not add anything to the coffee or tea)    __x__ 2. No Alcohol for 24 hours before or after surgery.   __x__ 3. No Smoking or e-cigarettes for 24 hours before surgery.  Do not use any chewable tobacco products for at least 6 hours before surgery.   __x__ 4. Notify your doctor if there is any change in your medical condition (cold, fever, infections).   __x__ 5. On the morning of surgery brush your teeth with toothpaste and water.  You may rinse your mouth with mouthwash if you wish.  Do not swallow any toothpaste or mouthwash.  Please read over the following fact sheets that you were given:   [facility] Preparing for Surgery and/or MRSA Information    __x__ Use CHG Soap as directed on instruction sheet    Do not wear jewelry, make-up, hairpins, clips or nail polish on the day of surgery.  Do not wear lotions, powders, deodorant, or perfumes.   Do not shave below the face/neck 48 hours prior to  surgery.   Do not bring valuables to the hospital.    [facility] is not responsible for any belongings or valuables.               Contacts, dentures or bridgework may not be worn into surgery.  For patients discharged on the day of surgery, you will NOT be permitted to drive yourself home.  You must have a responsible adult with you for 24 hours after surgery.  __x__ Take these medicines on the morning of surgery with a SMALL SIP OF WATER:  1. Felbatol  2. Omeprazole (Prilosec)  __x__ Follow recommendations from Cardiologist, Pulmonologist or PCP regarding stopping Aspirin, Coumadin, Plavix, Eliquis, Effient, Pradaxa, and Pletal.  __x__ TODAY: Stop Anti-inflammatories such as Advil, Ibuprofen, Motrin, Aleve, Naproxen, Naprosyn, BC/Goodies powders or Excedrin Migraine aspirin products. You may continue to take Tylenol and Celebrex.   __x__ TODAY: Stop supplements (Turmeric, Zinc) until after surgery. You may continue to take Vitamin D, Vitamin B, and multivitamin.

## 2018-07-23 DIAGNOSIS — G40219 Localization-related (focal) (partial) symptomatic epilepsy and epileptic syndromes with complex partial seizures, intractable, without status epilepticus: Secondary | ICD-10-CM | POA: Diagnosis not present

## 2018-07-23 MED ORDER — CLINDAMYCIN PHOSPHATE 900 MG/50ML IV SOLN
900.0000 mg | INTRAVENOUS | Status: AC
  Administered 2018-07-24: 900 mg via INTRAVENOUS

## 2018-07-24 ENCOUNTER — Encounter: Payer: Self-pay | Admitting: Emergency Medicine

## 2018-07-24 ENCOUNTER — Ambulatory Visit: Payer: Medicaid Other | Admitting: Anesthesiology

## 2018-07-24 ENCOUNTER — Encounter
Admission: RE | Disposition: A | Discharge status: Home / Self Care | Payer: Self-pay | Source: Home / Self Care | Attending: Surgery

## 2018-07-24 ENCOUNTER — Other Ambulatory Visit: Payer: Self-pay

## 2018-07-24 ENCOUNTER — Ambulatory Visit
Admission: RE | Admit: 2018-07-24 | Discharge: 2018-07-24 | Disposition: A | Discharge status: Home / Self Care | Payer: Medicaid Other | Attending: Surgery | Admitting: Surgery

## 2018-07-24 DIAGNOSIS — K219 Gastro-esophageal reflux disease without esophagitis: Secondary | ICD-10-CM | POA: Insufficient documentation

## 2018-07-24 DIAGNOSIS — Z8614 Personal history of Methicillin resistant Staphylococcus aureus infection: Secondary | ICD-10-CM | POA: Diagnosis not present

## 2018-07-24 DIAGNOSIS — Z885 Allergy status to narcotic agent status: Secondary | ICD-10-CM | POA: Diagnosis not present

## 2018-07-24 DIAGNOSIS — Z791 Long term (current) use of non-steroidal anti-inflammatories (NSAID): Secondary | ICD-10-CM | POA: Insufficient documentation

## 2018-07-24 DIAGNOSIS — Z79899 Other long term (current) drug therapy: Secondary | ICD-10-CM | POA: Diagnosis not present

## 2018-07-24 DIAGNOSIS — G40219 Localization-related (focal) (partial) symptomatic epilepsy and epileptic syndromes with complex partial seizures, intractable, without status epilepticus: Secondary | ICD-10-CM | POA: Diagnosis not present

## 2018-07-24 DIAGNOSIS — G809 Cerebral palsy, unspecified: Secondary | ICD-10-CM | POA: Diagnosis not present

## 2018-07-24 DIAGNOSIS — K824 Cholesterolosis of gallbladder: Secondary | ICD-10-CM | POA: Diagnosis not present

## 2018-07-24 DIAGNOSIS — Z882 Allergy status to sulfonamides status: Secondary | ICD-10-CM | POA: Diagnosis not present

## 2018-07-24 DIAGNOSIS — Z880 Allergy status to penicillin: Secondary | ICD-10-CM | POA: Insufficient documentation

## 2018-07-24 DIAGNOSIS — K811 Chronic cholecystitis: Secondary | ICD-10-CM | POA: Insufficient documentation

## 2018-07-24 DIAGNOSIS — Z6839 Body mass index (BMI) 39.0-39.9, adult: Secondary | ICD-10-CM | POA: Insufficient documentation

## 2018-07-24 DIAGNOSIS — R51 Headache: Secondary | ICD-10-CM | POA: Insufficient documentation

## 2018-07-24 HISTORY — PX: ROBOTIC ASSISTED LAPAROSCOPIC CHOLECYSTECTOMY: SHX6521

## 2018-07-24 LAB — POCT PREGNANCY, URINE: Preg Test, Ur: NEGATIVE

## 2018-07-24 SURGERY — ROBOTIC ASSISTED LAPAROSCOPIC CHOLECYSTECTOMY
Anesthesia: General

## 2018-07-24 MED ORDER — HYDROCODONE-ACETAMINOPHEN 5-325 MG PO TABS: 1.0000 | ORAL_TABLET | Freq: Four times a day (QID) | ORAL | 0 refills | Status: DC | PRN

## 2018-07-24 MED ORDER — FENTANYL CITRATE (PF) 100 MCG/2ML IJ SOLN
INTRAMUSCULAR | Status: AC
  Administered 2018-07-24: 50 ug via INTRAVENOUS
  Filled 2018-07-24: qty 2

## 2018-07-24 MED ORDER — PHENYLEPHRINE HCL 10 MG/ML IJ SOLN
INTRAMUSCULAR | Status: DC | PRN
  Administered 2018-07-24: 200 ug via INTRAVENOUS

## 2018-07-24 MED ORDER — MIDAZOLAM HCL 2 MG/2ML IJ SOLN
INTRAMUSCULAR | Status: DC | PRN
  Administered 2018-07-24: 2 mg via INTRAVENOUS

## 2018-07-24 MED ORDER — FENTANYL CITRATE (PF) 100 MCG/2ML IJ SOLN
25.0000 ug | INTRAMUSCULAR | Status: DC | PRN
  Administered 2018-07-24 (×4): 50 ug via INTRAVENOUS

## 2018-07-24 MED ORDER — CHLORHEXIDINE GLUCONATE CLOTH 2 % EX PADS: 6.0000 | MEDICATED_PAD | Freq: Once | CUTANEOUS | Status: DC

## 2018-07-24 MED ORDER — ONDANSETRON HCL 4 MG/2ML IJ SOLN
INTRAMUSCULAR | Status: DC | PRN
  Administered 2018-07-24: 4 mg via INTRAVENOUS

## 2018-07-24 MED ORDER — FENTANYL CITRATE (PF) 100 MCG/2ML IJ SOLN
INTRAMUSCULAR | Status: DC | PRN
  Administered 2018-07-24 (×2): 50 ug via INTRAVENOUS

## 2018-07-24 MED ORDER — ACETAMINOPHEN 500 MG PO TABS
ORAL_TABLET | ORAL | Status: AC
  Administered 2018-07-24: 1000 mg via ORAL
  Filled 2018-07-24: qty 2

## 2018-07-24 MED ORDER — FENTANYL CITRATE (PF) 100 MCG/2ML IJ SOLN: 50.0000 ug | Freq: Once | INTRAMUSCULAR | Status: DC

## 2018-07-24 MED ORDER — SUGAMMADEX SODIUM 200 MG/2ML IV SOLN
INTRAVENOUS | Status: DC | PRN
  Administered 2018-07-24: 200 mg via INTRAVENOUS

## 2018-07-24 MED ORDER — BUPIVACAINE-EPINEPHRINE (PF) 0.25% -1:200000 IJ SOLN
INTRAMUSCULAR | Status: AC
  Filled 2018-07-24: qty 30

## 2018-07-24 MED ORDER — CLINDAMYCIN PHOSPHATE 900 MG/50ML IV SOLN
INTRAVENOUS | Status: AC
  Filled 2018-07-24: qty 50

## 2018-07-24 MED ORDER — INDOCYANINE GREEN 25 MG IV SOLR
10.0000 mg | Freq: Once | INTRAVENOUS | Status: AC
  Administered 2018-07-24: 10 mg via INTRAVENOUS
  Filled 2018-07-24: qty 25

## 2018-07-24 MED ORDER — CELECOXIB 200 MG PO CAPS: 200.0000 mg | ORAL_CAPSULE | ORAL | Status: DC

## 2018-07-24 MED ORDER — SEVOFLURANE IN SOLN
RESPIRATORY_TRACT | Status: AC
  Filled 2018-07-24: qty 250

## 2018-07-24 MED ORDER — FENTANYL CITRATE (PF) 100 MCG/2ML IJ SOLN
INTRAMUSCULAR | Status: AC
  Filled 2018-07-24: qty 2

## 2018-07-24 MED ORDER — ROCURONIUM BROMIDE 100 MG/10ML IV SOLN
INTRAVENOUS | Status: DC | PRN
  Administered 2018-07-24: 20 mg via INTRAVENOUS
  Administered 2018-07-24: 50 mg via INTRAVENOUS

## 2018-07-24 MED ORDER — OXYCODONE HCL 5 MG PO TABS
ORAL_TABLET | ORAL | Status: AC
  Administered 2018-07-24: 5 mg via ORAL
  Filled 2018-07-24: qty 1

## 2018-07-24 MED ORDER — BUPIVACAINE HCL 0.25 % IJ SOLN
INTRAMUSCULAR | Status: DC | PRN
  Administered 2018-07-24: 30 mL

## 2018-07-24 MED ORDER — LACTATED RINGERS IV SOLN
INTRAVENOUS | Status: DC
  Administered 2018-07-24: 09:00:00 via INTRAVENOUS

## 2018-07-24 MED ORDER — HYDROCODONE-ACETAMINOPHEN 5-325 MG PO TABS: 1.0000 | ORAL_TABLET | ORAL | 0 refills | Status: DC | PRN

## 2018-07-24 MED ORDER — DEXAMETHASONE SODIUM PHOSPHATE 10 MG/ML IJ SOLN
INTRAMUSCULAR | Status: DC | PRN
  Administered 2018-07-24: 10 mg via INTRAVENOUS

## 2018-07-24 MED ORDER — LIDOCAINE HCL (CARDIAC) PF 100 MG/5ML IV SOSY
PREFILLED_SYRINGE | INTRAVENOUS | Status: DC | PRN
  Administered 2018-07-24: 100 mg via INTRAVENOUS

## 2018-07-24 MED ORDER — OXYCODONE HCL 5 MG PO TABS
5.0000 mg | ORAL_TABLET | Freq: Once | ORAL | Status: AC
  Administered 2018-07-24: 5 mg via ORAL

## 2018-07-24 MED ORDER — GABAPENTIN 300 MG PO CAPS
300.0000 mg | ORAL_CAPSULE | ORAL | Status: AC
  Administered 2018-07-24: 300 mg via ORAL

## 2018-07-24 MED ORDER — PROPOFOL 10 MG/ML IV BOLUS
INTRAVENOUS | Status: DC | PRN
  Administered 2018-07-24: 200 mg via INTRAVENOUS

## 2018-07-24 MED ORDER — GABAPENTIN 300 MG PO CAPS
ORAL_CAPSULE | ORAL | Status: AC
  Administered 2018-07-24: 300 mg via ORAL
  Filled 2018-07-24: qty 1

## 2018-07-24 MED ORDER — ONDANSETRON HCL 4 MG/2ML IJ SOLN: 4.0000 mg | Freq: Once | INTRAMUSCULAR | Status: DC | PRN

## 2018-07-24 MED ORDER — MIDAZOLAM HCL 2 MG/2ML IJ SOLN
INTRAMUSCULAR | Status: AC
  Filled 2018-07-24: qty 2

## 2018-07-24 MED ORDER — SODIUM CHLORIDE FLUSH 0.9 % IV SOLN
INTRAVENOUS | Status: AC
  Filled 2018-07-24: qty 10

## 2018-07-24 MED ORDER — ACETAMINOPHEN 500 MG PO TABS
1000.0000 mg | ORAL_TABLET | ORAL | Status: AC
  Administered 2018-07-24: 1000 mg via ORAL

## 2018-07-24 MED ORDER — PROPOFOL 10 MG/ML IV BOLUS
INTRAVENOUS | Status: AC
  Filled 2018-07-24: qty 20

## 2018-07-24 SURGICAL SUPPLY — 41 items
CANISTER SUCT 1200ML W/VALVE (MISCELLANEOUS) ×2 IMPLANT
CHLORAPREP W/TINT 26ML (MISCELLANEOUS) ×2 IMPLANT
CLIP VESOLOCK MED LG 6/CT (CLIP) ×4 IMPLANT
CORD BIP STRL DISP 12FT (MISCELLANEOUS) ×2 IMPLANT
COVER TIP SHEARS 8 DVNC (MISCELLANEOUS) IMPLANT
COVER TIP SHEARS 8MM DA VINCI (MISCELLANEOUS)
COVER WAND RF STERILE (DRAPES) ×2 IMPLANT
DECANTER SPIKE VIAL GLASS SM (MISCELLANEOUS) ×2 IMPLANT
DEFOGGER SCOPE WARMER CLEARIFY (MISCELLANEOUS) ×2 IMPLANT
DERMABOND ADVANCED (GAUZE/BANDAGES/DRESSINGS) ×1
DERMABOND ADVANCED .7 DNX12 (GAUZE/BANDAGES/DRESSINGS) ×1 IMPLANT
DRAPE SHEET LG 3/4 BI-LAMINATE (DRAPES) ×2 IMPLANT
ELECT REM PT RETURN 9FT ADLT (ELECTROSURGICAL) ×2
ELECTRODE REM PT RTRN 9FT ADLT (ELECTROSURGICAL) ×1 IMPLANT
GLOVE BIO SURGEON STRL SZ7 (GLOVE) ×4 IMPLANT
GOWN STRL REUS W/ TWL LRG LVL3 (GOWN DISPOSABLE) ×4 IMPLANT
GOWN STRL REUS W/TWL LRG LVL3 (GOWN DISPOSABLE) ×4
IRRIGATION STRYKERFLOW (MISCELLANEOUS) IMPLANT
IRRIGATOR STRYKERFLOW (MISCELLANEOUS)
IV NS 1000ML (IV SOLUTION) ×1
IV NS 1000ML BAXH (IV SOLUTION) ×1 IMPLANT
KIT ACCESSORY DA VINCI DISP (KITS) ×1
KIT ACCESSORY DVNC DISP (KITS) ×1 IMPLANT
KIT PINK PAD W/HEAD ARE REST (MISCELLANEOUS) ×2
KIT PINK PAD W/HEAD ARM REST (MISCELLANEOUS) ×1 IMPLANT
LABEL OR SOLS (LABEL) ×2 IMPLANT
NEEDLE HYPO 22GX1.5 SAFETY (NEEDLE) ×2 IMPLANT
NS IRRIG 500ML POUR BTL (IV SOLUTION) ×2 IMPLANT
PACK LAP CHOLECYSTECTOMY (MISCELLANEOUS) ×2 IMPLANT
PENCIL ELECTRO HAND CTR (MISCELLANEOUS) ×2 IMPLANT
POUCH SPECIMEN RETRIEVAL 10MM (ENDOMECHANICALS) ×2 IMPLANT
PROGRASP ENDOWRIST DA VINCI (INSTRUMENTS)
PROGRASP ENDOWRIST DVNC (INSTRUMENTS) IMPLANT
SOLUTION ELECTROLUBE (MISCELLANEOUS) ×2 IMPLANT
SPONGE LAP 18X18 RF (DISPOSABLE) ×2 IMPLANT
STRAP SAFETY 5IN WIDE (MISCELLANEOUS) ×2 IMPLANT
SUT MNCRL AB 4-0 PS2 18 (SUTURE) ×2 IMPLANT
SUT VICRYL 0 AB UR-6 (SUTURE) ×4 IMPLANT
TROCAR 130MM GELPORT  DAV (MISCELLANEOUS) ×2 IMPLANT
TROCAR XCEL NON-BLD 5MMX100MML (ENDOMECHANICALS) ×2 IMPLANT
TUBING INSUF HEATED (TUBING) ×2 IMPLANT

## 2018-07-24 NOTE — Transfer of Care (Signed)
Immediate Anesthesia Transfer of Care Note  Patient: Destiny Flores  Procedure(s) Performed: ROBOTIC ASSISTED LAPAROSCOPIC CHOLECYSTECTOMY (N/A )  Patient Location: PACU  Anesthesia Type:General  Level of Consciousness: awake, alert  and oriented  Airway & Oxygen Therapy: Patient Spontanous Breathing and Patient connected to face mask oxygen  Post-op Assessment: Report given to RN and Post -op Vital signs reviewed and stable  Post vital signs: Reviewed and stable  Last Vitals:  Vitals Value Taken Time  BP    Temp    Pulse    Resp    SpO2      Last Pain:  Vitals:   07/24/18 0816  TempSrc: Tympanic  PainSc: 0-No pain         Complications: No apparent anesthesia complications

## 2018-07-24 NOTE — Interval H&P Note (Signed)
History and Physical Interval Note:  07/24/2018 10:02 AM  Destiny Flores  has presented today for surgery, with the diagnosis of G B POLYP  The various methods of treatment have been discussed with the patient and family. After consideration of risks, benefits and other options for treatment, the patient has consented to  Procedure(s): ROBOTIC ASSISTED LAPAROSCOPIC CHOLECYSTECTOMY (N/A) as a surgical intervention .  The patient's history has been reviewed, patient examined, no change in status, stable for surgery.  I have reviewed the patient's chart and labs.  Questions were answered to the patient's satisfaction.     Diego F Pabon

## 2018-07-24 NOTE — Discharge Instructions (Signed)

## 2018-07-24 NOTE — Anesthesia Procedure Notes (Signed)
Procedure Name: Intubation Date/Time: 07/24/2018 10:35 AM Performed by: Philbert Riser, CRNA Pre-anesthesia Checklist: Patient identified, Emergency Drugs available, Suction available, Patient being monitored and Timeout performed Patient Re-evaluated:Patient Re-evaluated prior to induction Oxygen Delivery Method: Circle system utilized and Simple face mask Preoxygenation: Pre-oxygenation with 100% oxygen Induction Type: IV induction Ventilation: Mask ventilation without difficulty Laryngoscope Size: Mac and 3 Grade View: Grade I Tube type: Oral Tube size: 7.0 mm Number of attempts: 1 Airway Equipment and Method: Stylet Placement Confirmation: ETT inserted through vocal cords under direct vision,  positive ETCO2 and breath sounds checked- equal and bilateral Secured at: 21 cm Tube secured with: Tape

## 2018-07-24 NOTE — Anesthesia Postprocedure Evaluation (Signed)
Anesthesia Post Note  Patient: Destiny Flores  Procedure(s) Performed: ROBOTIC ASSISTED LAPAROSCOPIC CHOLECYSTECTOMY (N/A )  Patient location during evaluation: PACU Anesthesia Type: General Level of consciousness: awake and alert Pain management: pain level controlled Vital Signs Assessment: post-procedure vital signs reviewed and stable Respiratory status: spontaneous breathing, nonlabored ventilation, respiratory function stable and patient connected to nasal cannula oxygen Cardiovascular status: blood pressure returned to baseline and stable Postop Assessment: no apparent nausea or vomiting Anesthetic complications: no     Last Vitals:  Vitals:   07/24/18 1255 07/24/18 1400  BP: 115/65 112/83  Pulse: 81 88  Resp: 16   Temp: (!) 36.4 C   SpO2: 100% 100%    Last Pain:  Vitals:   07/24/18 1255  TempSrc: Temporal  PainSc: 5                  Lenard Simmer

## 2018-07-24 NOTE — Op Note (Signed)
Robotic assisted laparoscopic Cholecystectomy  Pre-operative Diagnosis: GB polyp  Post-operative Diagnosis: same  Procedure:  Robotic assisted laparoscopic Cholecystectomy  Surgeon: Sterling Big, MD FACS  Anesthesia: Gen. with endotracheal tube  Findings: Chronic mild Cholecystitis   Estimated Blood Loss: 5cc                 Specimens: Gallbladder           Complications: none   Procedure Details  The patient was seen again in the Holding Room. The benefits, complications, treatment options, and expected outcomes were discussed with the patient. The risks of bleeding, infection, recurrence of symptoms, failure to resolve symptoms, bile duct damage, bile duct leak, retained common bile duct stone, bowel injury, any of which could require further surgery and/or ERCP, stent, or papillotomy were reviewed with the patient. The likelihood of improving the patient's symptoms with return to their baseline status is good.  The patient and/or family concurred with the proposed plan, giving informed consent.  The patient was taken to Operating Room, identified as Destiny Flores and the procedure verified as Laparoscopic Cholecystectomy.  A Time Out was held and the above information confirmed.  Prior to the induction of general anesthesia, antibiotic prophylaxis was administered. VTE prophylaxis was in place. General endotracheal anesthesia was then administered and tolerated well. After the induction, the abdomen was prepped with Chloraprep and draped in the sterile fashion. The patient was positioned in the supine position.  Cut down technique was used to enter the abdominal cavity and a Hasson trochar was placed after two vicryl stitches were anchored to the fascia. Pneumoperitoneum was then created with CO2 and tolerated well without any adverse changes in the patient's vital signs.  Three 8-mm ports were placed under direct vision. All skin incisions  were infiltrated with a local anesthetic  agent before making the incision and placing the trocars.   The patient was positioned  in reverse Trendelenburg, robot was brought to the surgical field and docked in the standard fashion.  We made sure all the instrumentation was kept indirect view at all times and that there were no collision between the arms. I scrubbed out and went to the console.  The gallbladder was identified, the fundus grasped and retracted cephalad. Adhesions were lysed bluntly. The infundibulum was grasped and retracted laterally, exposing the peritoneum overlying the triangle of Calot. This was then divided and exposed in a blunt fashion. An extended critical view of the cystic duct and cystic artery was obtained.  The cystic duct was clearly identified and bluntly dissected.   Artery and duct were double clipped and divided. Using ICG cholangiography we visualize the cystic duct and so no a Baron biliary ductal anatomy or evidence of bile injuries. The gallbladder was taken from the gallbladder fossa in a retrograde fashion with the electrocautery.  Hemostasis was achieved with the electrocautery. nspection of the right upper quadrant was performed. No bleeding, bile duct injury or leak, or bowel injury was noted. Robotic instruments and robotic arms were undocked in the standard fashion.  I scrubbed back in.  The gallbladder was removed and placed in an Endocatch bag.   Pneumoperitoneum was released.  The periumbilical port site was closed with interrumpted 0 Vicryl sutures. 4-0 subcuticular Monocryl was used to close the skin. Dermabond was  applied.  The patient was then extubated and brought to the recovery room in stable condition. Sponge, lap, and needle counts were correct at closure and at the conclusion of the case.  Caroleen Hamman, MD, FACS

## 2018-07-24 NOTE — Anesthesia Preprocedure Evaluation (Signed)
Anesthesia Evaluation  Patient identified by MRN, date of birth, ID band Patient awake    Reviewed: Allergy & Precautions, NPO status , Patient's Chart, lab work & pertinent test results  History of Anesthesia Complications (+) history of anesthetic complications (migraines)  Airway Mallampati: III  TM Distance: <3 FB Neck ROM: Full    Dental no notable dental hx.    Pulmonary neg pulmonary ROS, neg sleep apnea, neg COPD,    breath sounds clear to auscultation- rhonchi (-) wheezing      Cardiovascular Exercise Tolerance: Good (-) hypertension(-) angina(-) CAD, (-) Past MI, (-) Cardiac Stents and (-) CABG  Rhythm:Regular Rate:Normal - Systolic murmurs and - Diastolic murmurs    Neuro/Psych  Headaches, Seizures - (last seizure 2012), Well Controlled,  negative psych ROS   GI/Hepatic Neg liver ROS, GERD  ,  Endo/Other  neg diabetesMorbid obesity  Renal/GU negative Renal ROS     Musculoskeletal negative musculoskeletal ROS (+)   Abdominal (+) + obese,   Peds  Hematology negative hematology ROS (+)   Anesthesia Other Findings Past Medical History: 12/24/2017: Cerebral palsy (HCC) No date: Complication of anesthesia     Comment:  SEVERE MIGRAINES AFTER ANESTHESIA 12/24/2017: Epilepsy (HCC)     Comment:  stress, heat and random things bring on seizures 12/24/2017: Epilepsy associated with specific stimuli (HCC) 12/24/2017: 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) No date: GERD (gastroesophageal reflux disease) No date: Headache 12/24/2017: Hidradenitis suppurativa No date: History of kidney stones     Comment:  H/O 2008: History of methicillin resistant staphylococcus aureus (MRSA)     Comment:  positive from axilla, left No date: Hydradenitis No date: Seizures [facility])     Comment:  LAST SEIZURE 2012 12/24/2017: Status post VNS (vagus nerve stimulator) placement     Comment:  battery change  in 2019, inserted in 2002   Reproductive/Obstetrics                             Anesthesia Physical  Anesthesia Plan  ASA: III  Anesthesia Plan: General   Post-op Pain Management:    Induction: Intravenous  PONV Risk Score and Plan: 3 and Ondansetron, Dexamethasone, Midazolam and Treatment may vary due to age or medical condition  Airway Management Planned: Oral ETT  Additional Equipment:   Intra-op Plan:   Post-operative Plan: Extubation in OR  Informed Consent: I have reviewed the patients History and Physical, chart, labs and discussed the procedure including the risks, benefits and alternatives for the proposed anesthesia with the patient or authorized representative who has indicated his/her understanding and acceptance.     Dental advisory given  Plan Discussed with: CRNA and Anesthesiologist  Anesthesia Plan Comments:         Anesthesia Quick Evaluation

## 2018-07-24 NOTE — Anesthesia Post-op Follow-up Note (Signed)
Anesthesia QCDR form completed.        

## 2018-07-25 ENCOUNTER — Encounter: Payer: Self-pay | Admitting: Surgery

## 2018-07-28 ENCOUNTER — Encounter: Admit: 2018-07-28 | Discharge: 2018-07-28 | Payer: BLUE CROSS/BLUE SHIELD

## 2018-07-28 DIAGNOSIS — L732 Hidradenitis suppurativa: Principal | ICD-10-CM

## 2018-07-28 DIAGNOSIS — L910 Hypertrophic scar: Secondary | ICD-10-CM

## 2018-07-28 DIAGNOSIS — Z79899 Other long term (current) drug therapy: Secondary | ICD-10-CM

## 2018-07-28 LAB — SURGICAL PATHOLOGY

## 2018-07-28 MED ORDER — ADALIMUMAB PEN CITRATE FREE 40 MG/0.4 ML: each | 11 refills | 0 days | Status: AC

## 2018-07-28 MED ORDER — ADALIMUMAB PEN CITRATE FREE 40 MG/0.4 ML
SUBCUTANEOUS | 11 refills | 0.00000 days | Status: CP
Start: 2018-07-28 — End: 2018-07-28

## 2018-07-28 MED ORDER — ADALIMUMAB 80 MG/0.8 ML SUBCUTANEOUS PEN KIT: kit | 0 refills | 0 days | Status: AC

## 2018-07-28 MED ORDER — ADALIMUMAB 80 MG/0.8 ML SUBCUTANEOUS PEN KIT
PACK | SUBCUTANEOUS | 0 refills | 0.00000 days | Status: CP
Start: 2018-07-28 — End: 2018-08-12

## 2018-07-29 ENCOUNTER — Telehealth: Payer: Self-pay | Admitting: *Deleted

## 2018-07-29 NOTE — Telephone Encounter (Signed)
Appointment with Dr Everlene Farrier 07-30-18 at 9:00, mother aware.

## 2018-07-29 NOTE — Telephone Encounter (Signed)
Patients mother called and stated that she had surgery on 07/24/18 by Dr.Pabon and she is now complaining of Rash and Blisters across the stomach. She stated that it started on Saturday. She has had chills but no fever. Please call and advise

## 2018-07-30 ENCOUNTER — Other Ambulatory Visit: Payer: Self-pay

## 2018-07-30 ENCOUNTER — Encounter: Payer: Self-pay | Admitting: Surgery

## 2018-07-30 ENCOUNTER — Ambulatory Visit (INDEPENDENT_AMBULATORY_CARE_PROVIDER_SITE_OTHER): Payer: Medicaid Other | Admitting: Surgery

## 2018-07-30 VITALS — BP 115/78 | HR 97 | Temp 97.7°F | Resp 18 | Ht 64.0 in | Wt 220.6 lb

## 2018-07-30 DIAGNOSIS — Z09 Encounter for follow-up examination after completed treatment for conditions other than malignant neoplasm: Secondary | ICD-10-CM

## 2018-07-30 MED ORDER — HYDROCORTISONE 2.5 % EX CREA: TOPICAL_CREAM | Freq: Two times a day (BID) | CUTANEOUS | 0 refills | Status: DC

## 2018-07-30 NOTE — Progress Notes (Signed)
S/p Rob lap chole 1/27 Main issue is hives around incision  No fevers, + PO. Minimal abd discomfort Path d/w pt in detail  PE NAD Abd: soft, hives around incision site c/w allergic reaction. No infection. No peritonitis  A/p Allergic reaction to adhesive from steristrips or from prep. OTC Antihistamine Topical steroid cream RTC next week

## 2018-07-30 NOTE — Patient Instructions (Addendum)
The patient is aware to call back for any questions or new concerns.  Follow up as scheduled  May use cream 2 times a day, continue loratadine as needed

## 2018-08-06 ENCOUNTER — Encounter: Payer: Medicaid Other | Admitting: Surgery

## 2018-08-11 ENCOUNTER — Other Ambulatory Visit: Payer: Self-pay

## 2018-08-11 ENCOUNTER — Ambulatory Visit (INDEPENDENT_AMBULATORY_CARE_PROVIDER_SITE_OTHER): Payer: Medicaid Other | Admitting: Surgery

## 2018-08-11 ENCOUNTER — Encounter: Payer: Self-pay | Admitting: Surgery

## 2018-08-11 VITALS — BP 105/72 | HR 77 | Temp 97.2°F | Ht 64.0 in | Wt 221.0 lb

## 2018-08-11 DIAGNOSIS — Z09 Encounter for follow-up examination after completed treatment for conditions other than malignant neoplasm: Secondary | ICD-10-CM

## 2018-08-12 ENCOUNTER — Encounter: Payer: Self-pay | Admitting: Surgery

## 2018-08-12 MED ORDER — ADALIMUMAB PEN CITRATE FREE 40 MG/0.4 ML
SUBCUTANEOUS | 11 refills | 0.00000 days | Status: CP
Start: 2018-08-12 — End: ?

## 2018-08-12 MED ORDER — ADALIMUMAB 80 MG/0.8 ML SUBCUTANEOUS PEN KIT
PACK | SUBCUTANEOUS | 0 refills | 0.00000 days | Status: CP
Start: 2018-08-12 — End: 2019-01-05

## 2018-08-12 NOTE — Progress Notes (Signed)
S/p rob chole Doing much better Taking po Pain continues to improve  PE NAD Abd: soft, incisions c/d/i, no infection or peritonitis  A/P Doing well No surgical complications No heavy lifting  RTC prn Path d/w pt

## 2018-08-26 DIAGNOSIS — R569 Unspecified convulsions: Secondary | ICD-10-CM | POA: Diagnosis not present

## 2018-08-26 DIAGNOSIS — G40219 Localization-related (focal) (partial) symptomatic epilepsy and epileptic syndromes with complex partial seizures, intractable, without status epilepticus: Secondary | ICD-10-CM | POA: Diagnosis not present

## 2018-08-27 DIAGNOSIS — R569 Unspecified convulsions: Secondary | ICD-10-CM | POA: Diagnosis not present

## 2018-08-27 DIAGNOSIS — G40219 Localization-related (focal) (partial) symptomatic epilepsy and epileptic syndromes with complex partial seizures, intractable, without status epilepticus: Secondary | ICD-10-CM | POA: Diagnosis not present

## 2018-08-28 DIAGNOSIS — R569 Unspecified convulsions: Secondary | ICD-10-CM | POA: Diagnosis not present

## 2018-08-28 DIAGNOSIS — G40219 Localization-related (focal) (partial) symptomatic epilepsy and epileptic syndromes with complex partial seizures, intractable, without status epilepticus: Secondary | ICD-10-CM | POA: Diagnosis not present

## 2018-08-29 DIAGNOSIS — G40219 Localization-related (focal) (partial) symptomatic epilepsy and epileptic syndromes with complex partial seizures, intractable, without status epilepticus: Secondary | ICD-10-CM | POA: Diagnosis not present

## 2018-08-29 DIAGNOSIS — R569 Unspecified convulsions: Secondary | ICD-10-CM | POA: Diagnosis not present

## 2018-08-30 DIAGNOSIS — R569 Unspecified convulsions: Secondary | ICD-10-CM | POA: Diagnosis not present

## 2018-08-30 DIAGNOSIS — G40219 Localization-related (focal) (partial) symptomatic epilepsy and epileptic syndromes with complex partial seizures, intractable, without status epilepticus: Secondary | ICD-10-CM | POA: Diagnosis not present

## 2018-08-31 DIAGNOSIS — R569 Unspecified convulsions: Secondary | ICD-10-CM | POA: Diagnosis not present

## 2018-08-31 DIAGNOSIS — G40219 Localization-related (focal) (partial) symptomatic epilepsy and epileptic syndromes with complex partial seizures, intractable, without status epilepticus: Secondary | ICD-10-CM | POA: Diagnosis not present

## 2018-09-01 DIAGNOSIS — R569 Unspecified convulsions: Secondary | ICD-10-CM | POA: Diagnosis not present

## 2018-09-01 DIAGNOSIS — G40219 Localization-related (focal) (partial) symptomatic epilepsy and epileptic syndromes with complex partial seizures, intractable, without status epilepticus: Secondary | ICD-10-CM | POA: Diagnosis not present

## 2018-09-02 DIAGNOSIS — G40219 Localization-related (focal) (partial) symptomatic epilepsy and epileptic syndromes with complex partial seizures, intractable, without status epilepticus: Secondary | ICD-10-CM | POA: Diagnosis not present

## 2018-09-22 DIAGNOSIS — G40011 Localization-related (focal) (partial) idiopathic epilepsy and epileptic syndromes with seizures of localized onset, intractable, with status epilepticus: Secondary | ICD-10-CM | POA: Diagnosis not present

## 2018-09-23 ENCOUNTER — Encounter: Payer: Self-pay | Admitting: Family Medicine

## 2018-10-09 MED ORDER — CEFADROXIL 1 GRAM TABLET
ORAL_TABLET | Freq: Every day | ORAL | 0 refills | 0 days | Status: CP
Start: 2018-10-09 — End: 2018-10-20

## 2018-10-20 MED ORDER — CEPHALEXIN 500 MG CAPSULE
ORAL_CAPSULE | Freq: Two times a day (BID) | ORAL | 3 refills | 0.00000 days | Status: CP
Start: 2018-10-20 — End: 2019-01-05

## 2018-11-07 ENCOUNTER — Encounter: Payer: Self-pay | Admitting: Family Medicine

## 2019-01-05 ENCOUNTER — Encounter: Admit: 2019-01-05 | Discharge: 2019-01-06 | Payer: BLUE CROSS/BLUE SHIELD

## 2019-01-05 DIAGNOSIS — Z79899 Other long term (current) drug therapy: Secondary | ICD-10-CM

## 2019-01-05 DIAGNOSIS — L732 Hidradenitis suppurativa: Principal | ICD-10-CM

## 2019-01-05 DIAGNOSIS — L910 Hypertrophic scar: Secondary | ICD-10-CM

## 2019-01-05 MED ORDER — CEPHALEXIN 500 MG CAPSULE: 500 mg | capsule | Freq: Two times a day (BID) | 3 refills | 0 days | Status: AC

## 2019-01-05 MED ORDER — CEPHALEXIN 500 MG CAPSULE
ORAL_CAPSULE | Freq: Two times a day (BID) | ORAL | 3 refills | 0.00000 days | Status: CP
Start: 2019-01-05 — End: 2019-01-05

## 2019-01-20 ENCOUNTER — Other Ambulatory Visit: Payer: Self-pay

## 2019-01-20 ENCOUNTER — Ambulatory Visit: Payer: Medicaid Other | Admitting: Family Medicine

## 2019-01-20 ENCOUNTER — Encounter: Payer: Self-pay | Admitting: Family Medicine

## 2019-01-20 VITALS — BP 124/76 | HR 72 | Temp 97.9°F | Resp 16 | Ht 64.0 in | Wt 232.7 lb

## 2019-01-20 DIAGNOSIS — L239 Allergic contact dermatitis, unspecified cause: Secondary | ICD-10-CM | POA: Diagnosis not present

## 2019-01-20 MED ORDER — TRIAMCINOLONE ACETONIDE 0.025 % EX CREA: 1.0000 "application " | TOPICAL_CREAM | Freq: Two times a day (BID) | CUTANEOUS | 0 refills | Status: DC

## 2019-01-20 NOTE — Progress Notes (Signed)
Name: Destiny Flores   MRN: [id number]    DOB: 12/12/1980   Date:01/20/2019       Progress Note  Subjective  Chief Complaint  Chief Complaint  Patient presents with  . Rash    on arms for 3 weeks, started out looking like tiny bites    HPI  PT presents with concern for new onset rash to bilateral forearms that has been present for about 3 weeks. She has a few red spots on both forearms that she describes as itchy, no pain, swelling, purulent drainage, no fevers/chills.  She has been using hand sanitizer that she uses on her hands up to her forearms several times a day.  No new detergents, lotions, or any other products. She did treat her animals with new flea and tick solution after her lesions started.  She has not seen any bed bugs and has no lesions elsewhere on the body.  Patient Active Problem List   Diagnosis Date Noted  . 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 Vitamin D deficiency 03/06/2018  . Cerebral palsy (HCC) 12/24/2017  . Epilepsy (HCC) 12/24/2017  . Hidradenitis suppurativa 12/24/2017  . 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 Smoking status: Never Smoker  . Smokeless tobacco: Never Used  Substance Use Topics  . Alcohol use: Never    Frequency: Never     Current Outpatient Medications:  .  acetaminophen (TYLENOL) 500 MG tablet, Take 1,000 mg by mouth every 6 (six) hours as needed for moderate pain or headache., Disp: , Rfl:  .  acetaZOLAMIDE (DIAMOX) 500 MG capsule, Take 1 capsule (500 mg total) by mouth at bedtime., Disp: 90 capsule, Rfl: 0 .  aspirin-acetaminophen-caffeine (EXCEDRIN MIGRAINE) 250-250-65 MG tablet, Take 2 tablets by mouth every 6 (six) hours as needed for headache or migraine., Disp: , Rfl:  .  Brivaracetam 50 MG TABS, Take 50 mg by mouth daily., Disp: , Rfl:  .  Cholecalciferol (D3-50) 1.25 MG (50000 UT) capsule, Take by mouth., Disp: , Rfl:  .  clindamycin (CLINDAGEL) 1 % gel,  Apply 1 application topically daily as needed (flare up)., Disp: , Rfl:  .  diazepam (VALIUM) 5 MG tablet, Take 5 mg by mouth at bedtime as needed (for migraine). , Disp: , Rfl:  .  ergocalciferol (VITAMIN D2) 1.25 MG (50000 UT) capsule, Take 50,000 Units by mouth once a week. Sundays, Disp: , Rfl:  .  felbamate (FELBATOL) 400 MG tablet, Take 2 tablets (800 mg total) by mouth 2 (two) times daily., Disp: 60 tablet, Rfl: 0 .  felbamate (FELBATOL) 600 MG tablet, Take 600 mg by mouth daily after lunch. , Disp: , Rfl:  .  hydrocortisone 2.5 % cream, Apply topically 2 (two) times daily., Disp: 30 g, Rfl: 0 .  metoCLOPramide (REGLAN) 10 MG tablet, Take 1 tablet (10 mg total) by mouth daily as needed (for migraine)., Disp: 20 tablet, Rfl: 0 .  naproxen sodium (ALEVE) 220 MG tablet, Take 440 mg by mouth at bedtime as needed (for pain or headache)., Disp: , Rfl:  .  nystatin cream (MYCOSTATIN), Apply 1 application topically 2 (two) times daily. (Patient taking differently: Apply 1 application topically 2 (two) times daily as needed for dry skin. ), Disp: 30 g, Rfl: 0 .  omeprazole (PRILOSEC) 20 MG capsule, TAKE 1 CAPSULE (20 MG TOTAL) BY MOUTH AT BEDTIME., Disp: 90 capsule, Rfl: 0 .  ranitidine (ZANTAC) 150 MG tablet,  Take by mouth., Disp: , Rfl:  .  sodium chloride (OCEAN) 0.65 % SOLN nasal spray, Place 1 spray into both nostrils as needed for congestion., Disp: , Rfl:  .  TURMERIC CURCUMIN PO, Take 2 capsules by mouth daily., Disp: , Rfl:  .  Turmeric, Curcuma Longa, (CURCUMIN) POWD, Take by mouth., Disp: , Rfl:  .  verapamil (VERELAN PM) 240 MG 24 hr capsule, Take 1 capsule (240 mg total) by mouth at bedtime., Disp: 30 capsule, Rfl: 1 .  zinc gluconate ([employer] ZINC) 50 MG tablet, Take 50 mg by mouth daily. , Disp: , Rfl:  .  HYDROcodone-acetaminophen (NORCO) 5-325 MG tablet, Take 1-2 tablets by mouth every 6 (six) hours as needed for moderate pain., Disp: 30 tablet, Rfl: 0  Allergies  Allergen Reactions  .  Codeine Anaphylaxis  . Penicillins Anaphylaxis and Other (See Comments)    Has patient had a PCN reaction causing immediate rash, facial/tongue/throat swelling, SOB or lightheadedness with hypotension: Yes Has patient had a PCN reaction causing severe rash involving mucus membranes or skin necrosis: No Has patient had a PCN reaction that required hospitalization: Yes Has patient had a PCN reaction occurring within the last 10 years: No If all of the above answers are "NO", then may proceed with Cephalosporin use.  Has patient had a PCN reaction causing immediate rash, facial/tongue/throat swelling, SOB or lightheadedness with hypotension: Yes Has patient had a PCN reaction causing severe rash involving mucus membranes or skin necrosis: No Has patient had a PCN reaction that required hospitalization: Yes Has patient had a PCN reaction occurring within the last 10 years: No If all of the above answers are "NO", then may proceed with Cephalosporin use. Has patient had a PCN reaction causing immediate rash, facial/tongue/throat swelling, SOB or lightheadedness with hypotension: Yes Has patient had a PCN reaction causing severe rash involving mucus membranes or skin necrosis: No Has patient had a PCN reaction that required hospitalization: Yes Has patient had a PCN reaction occurring within the last 10 years: No If all of the above answers are "NO", then may proceed with Cephalosporin use.  . Sulfa Antibiotics Hives  . Tape Other (See Comments)    Large Boils-PAPER TAPE OK TO USE Large Boils-PAPER TAPE OK TO USE    I personally reviewed active problem list, medication list, allergies, notes from last encounter, lab results with the patient/caregiver today.  ROS  Ten systems reviewed and is negative except as mentioned in HPI  Objective  Vitals:   01/20/19 0722  BP: 124/76  Pulse: 72  Resp: 16  Temp: 97.9 F (36.6 C)  TempSrc: Oral  SpO2: 95%  Weight: 232 lb 11.2 oz (105.6 kg)   Height: 5\' 4"  (1.626 m)   Body mass index is 39.94 kg/m.  Nursing Note and Vital Signs reviewed.  Physical Exam  Constitutional: Patient appears well-developed and well-nourished. No distress.  HENT: Head: Normocephalic and atraumatic.  Eyes: Conjunctivae and EOM are normal. No scleral icterus. Neck: Normal range of motion. Neck supple.  Cardiovascular: Normal rate, regular rhythm and normal heart sounds.  No murmur heard. No BLE edema. Pulmonary/Chest: Effort normal and breath sounds normal. No respiratory distress. Musculoskeletal: Normal range of motion, no joint effusions. No gross deformities Neurological: Pt is alert and oriented to person, place, and time. No cranial nerve deficit. Coordination, balance, strength, speech and gait are normal.  Skin: Skin is warm and dry. No erythema. There is a maculopapular mildly erythematous rash to bilateral forearms, hands  and upper arms are spared, no other areas of the body are affected.  There is no sign of secondary infection at this time. Psychiatric: Patient has a normal mood and affect. behavior is normal. Judgment and thought content normal.   No results found for this or any previous visit (from the past 72 hour(s)).  Assessment & Plan  1. Allergic dermatitis - She does have HS and is on humira, discussed secondary infection signs and symptoms at length, will call if these occur.  She is on Keflex currently for groin abscess and doing well on this. - triamcinolone (KENALOG) 0.025 % cream; Apply 1 application topically 2 (two) times daily.  Dispense: 30 g; Refill: 0  -Red flags and when to present for emergency care or RTC including fever >101.26F, chest pain, shortness of breath, new/worsening/un-resolving symptoms, reviewed with patient at time of visit. Follow up and care instructions discussed and provided in AVS.

## 2019-01-20 NOTE — Patient Instructions (Addendum)
Eucerin Advanced Repair Cream - Try putting this on between using your Triamcinolone (kenalog) cream.

## 2019-01-26 DIAGNOSIS — G40011 Localization-related (focal) (partial) idiopathic epilepsy and epileptic syndromes with seizures of localized onset, intractable, with status epilepticus: Secondary | ICD-10-CM | POA: Diagnosis not present

## 2019-01-26 DIAGNOSIS — G40219 Localization-related (focal) (partial) symptomatic epilepsy and epileptic syndromes with complex partial seizures, intractable, without status epilepticus: Secondary | ICD-10-CM | POA: Diagnosis not present

## 2019-01-26 DIAGNOSIS — Z9689 Presence of other specified functional implants: Secondary | ICD-10-CM | POA: Diagnosis not present

## 2019-02-09 ENCOUNTER — Encounter: Payer: Self-pay | Admitting: Family Medicine

## 2019-02-09 DIAGNOSIS — K219 Gastro-esophageal reflux disease without esophagitis: Secondary | ICD-10-CM

## 2019-02-10 MED ORDER — OMEPRAZOLE 20 MG PO CPDR: 20.0000 mg | DELAYED_RELEASE_CAPSULE | Freq: Every day | ORAL | 1 refills | Status: DC

## 2019-03-11 ENCOUNTER — Ambulatory Visit: Admit: 2019-03-11 | Discharge: 2019-03-12 | Payer: BLUE CROSS/BLUE SHIELD

## 2019-03-11 DIAGNOSIS — G8918 Other acute postprocedural pain: Secondary | ICD-10-CM

## 2019-03-11 DIAGNOSIS — L732 Hidradenitis suppurativa: Secondary | ICD-10-CM

## 2019-03-11 MED ORDER — OXYCODONE-ACETAMINOPHEN 5 MG-325 MG TABLET
ORAL_TABLET | ORAL | 0 refills | 2.00000 days | Status: CP | PRN
Start: 2019-03-11 — End: 2019-03-12

## 2019-03-12 DIAGNOSIS — G8918 Other acute postprocedural pain: Secondary | ICD-10-CM

## 2019-03-12 MED ORDER — OXYCODONE-ACETAMINOPHEN 5 MG-325 MG TABLET: 1 | tablet | 0 refills | 2 days | Status: AC

## 2019-03-12 MED ORDER — OXYCODONE-ACETAMINOPHEN 5 MG-325 MG TABLET
ORAL_TABLET | ORAL | 0 refills | 2.00000 days | Status: CP | PRN
Start: 2019-03-12 — End: 2019-03-12

## 2019-03-18 ENCOUNTER — Ambulatory Visit (INDEPENDENT_AMBULATORY_CARE_PROVIDER_SITE_OTHER): Payer: Medicaid Other | Admitting: Emergency Medicine

## 2019-03-18 ENCOUNTER — Other Ambulatory Visit: Payer: Self-pay

## 2019-03-18 DIAGNOSIS — Z23 Encounter for immunization: Secondary | ICD-10-CM

## 2019-03-18 NOTE — Progress Notes (Signed)
fluarix  

## 2019-04-10 ENCOUNTER — Ambulatory Visit: Payer: Medicaid Other | Admitting: Family Medicine

## 2019-04-10 ENCOUNTER — Other Ambulatory Visit: Payer: Self-pay

## 2019-04-10 ENCOUNTER — Encounter: Payer: Self-pay | Admitting: Family Medicine

## 2019-04-10 VITALS — BP 118/70 | HR 101 | Temp 97.9°F | Resp 16 | Ht 64.0 in | Wt 224.8 lb

## 2019-04-10 DIAGNOSIS — F419 Anxiety disorder, unspecified: Secondary | ICD-10-CM

## 2019-04-10 DIAGNOSIS — Z1231 Encounter for screening mammogram for malignant neoplasm of breast: Secondary | ICD-10-CM

## 2019-04-10 DIAGNOSIS — Z0000 Encounter for general adult medical examination without abnormal findings: Secondary | ICD-10-CM | POA: Diagnosis not present

## 2019-04-10 DIAGNOSIS — E559 Vitamin D deficiency, unspecified: Secondary | ICD-10-CM

## 2019-04-10 DIAGNOSIS — E782 Mixed hyperlipidemia: Secondary | ICD-10-CM

## 2019-04-10 DIAGNOSIS — Z131 Encounter for screening for diabetes mellitus: Secondary | ICD-10-CM

## 2019-04-10 DIAGNOSIS — Z114 Encounter for screening for human immunodeficiency virus [HIV]: Secondary | ICD-10-CM | POA: Diagnosis not present

## 2019-04-10 NOTE — Progress Notes (Signed)
Name: Destiny Flores   MRN: [id number]    DOB: October 11, 1980   Date:04/10/2019       Progress Note  Subjective  Chief Complaint  Chief Complaint  Patient presents with  . Annual Exam    HPI  Patient presents for annual CPE.  Diet: Has been doing weight watchers and is down about 9lbs.   Exercise: She is not exercising at this time. Walks every now and then.  USPSTF grade A and B recommendations    Office Visit from 04/10/2019 in [facility]  AUDIT-C Score  0     Depression: Phq 9 is  Negative - does note more anxiety lately, looking for [employer]. Depression screen [facility] 2/9 04/10/2019 01/20/2019 06/05/2018 04/07/2018 03/06/2018  Decreased Interest 0 0 0 0 0  Down, Depressed, Hopeless 0 0 0 0 0  PHQ - 2 Score 0 0 0 0 0  Altered sleeping 0 0 0 0 0  Tired, decreased energy 0 0 0 0 0  Change in appetite 0 0 0 0 0  Feeling bad or failure about yourself  0 0 0 0 0  Trouble concentrating 0 0 0 0 0  Moving slowly or fidgety/restless 0 0 0 0 0  Suicidal thoughts 0 0 0 0 0  PHQ-9 Score 0 0 0 0 0  Difficult doing work/chores Not difficult at all Not difficult at all Not difficult at all Not difficult at all Not difficult at all   Hypertension: BP Readings from Last 3 Encounters:  04/10/19 118/70  01/20/19 124/76  08/11/18 105/72   Obesity: Wt Readings from Last 3 Encounters:  04/10/19 224 lb 12.8 oz (102 kg)  01/20/19 232 lb 11.2 oz (105.6 kg)  08/11/18 221 lb (100.2 kg)   BMI Readings from Last 3 Encounters:  04/10/19 38.59 kg/m  01/20/19 39.94 kg/m  08/11/18 37.93 kg/m     Hep C Screening: Negative in 2019 STD testing and prevention (HIV/chl/gon/syphilis): Has never been sexually active, declines.  Intimate partner violence: No concerns. Sexual History/Pain during Intercourse: Has never been sexually active.  Menstrual History/LMP/Abnormal Bleeding: She is having regular periods monthly. Incontinence Symptoms: No concerns  Breast cancer:  -  Last Mammogram: At age 26 - states was normal; turning 37 in January, will order today. - BRCA gene screening: Paternal Grandmother at age 31 died from Breast CA.  Osteoporosis Screening: Taking Vitamin D supplement; needs to get outside and exercise more to help prevent bone loss.  Cervical cancer screening: Has never   Skin cancer: Seeing dermatology Colorectal cancer: Denies family or personal history of colorectal cancer, no changes in BM's - no blood in stool, dark and tarry stool, mucus in stool, or constipation/diarrhea. Lung cancer:  Never Smoker.  Low Dose CT Chest recommended if Age 65-80 years, 30 pack-year currently smoking OR have quit w/in 15years. Patient does not qualify.   ECG: Denies chest pain, shortness of breath, or palpitations.   Advanced Care Planning: A voluntary discussion about advance care planning including the explanation and discussion of advance directives.  Discussed health care proxy and Living will, and the patient was able to identify a health care proxy as Wendie Chess (Mother).  Patient does not have a living will at present time. If patient does have living will, I have requested they bring this to the clinic to be scanned in to their chart.  Lipids: Lab Results  Component Value Date   CHOL 215 (H) 03/06/2018   Lab Results  Component Value Date   HDL 51 03/06/2018   Lab Results  Component Value Date   LDLCALC 134 (H) 03/06/2018   Lab Results  Component Value Date   TRIG 160 (H) 03/06/2018   Lab Results  Component Value Date   CHOLHDL 4.2 03/06/2018   No results found for: LDLDIRECT  Glucose: Glucose, Bld  Date Value Ref Range Status  06/05/2018 78 65 - 99 mg/dL Final    Comment:    .            Fasting reference interval .   05/06/2018 93 70 - 99 mg/dL Final  03/06/2018 79 65 - 139 mg/dL Final    Comment:    .        Non-fasting reference interval .     Patient Active Problem List   Diagnosis Date Noted  . Developmental  delay, mild 03/10/2018  . Vitamin D deficiency 03/06/2018  . Cerebral palsy ([place]) 12/24/2017  . Epilepsy ([place]) 12/24/2017  . Hidradenitis suppurativa 12/24/2017  . 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 abscess surgery Bilateral 2006   armpits and groin.12 surgeries overall.   Marland Kitchen AXILLARY HIDRADENITIS EXCISION     MULTIPLE HYDRADENITIS SURGERIES IN [place]  . hidradenitis groin Left 04/2017   last surgery done on groin and the symptoms have returned  . HYDRADENITIS EXCISION N/A 01/16/2018   Procedure: EXCISION HIDRADENITIS GROIN;  Surgeon: Jules Husbands, MD;  Location: [facility] ORS;  Service: General;  Laterality: N/A;  . KNEE SURGERY Left 2010   tendon and meniscus repair  . NOSE SURGERY  1998   deviated septum and sinus repair  . ROBOTIC ASSISTED LAPAROSCOPIC CHOLECYSTECTOMY N/A 07/24/2018   Procedure: ROBOTIC ASSISTED LAPAROSCOPIC CHOLECYSTECTOMY;  Surgeon: Jules Husbands, MD;  Location: [facility] ORS;  Service: General;  Laterality: N/A;  . TONSILLECTOMY    . TYMPANOSTOMY TUBE PLACEMENT     as a kid    Family History  Problem Relation Age of Onset  . Hyperlipidemia Father   . Epilepsy Brother   . ADD / ADHD Brother   . Breast cancer Paternal Grandmother 88       Passed away at 58yo  . Asthma Mother     Social History   Socioeconomic History  . Marital status: Single    Spouse name: Not on file  . Number of children: Not on file  . Years of education: Not on file  . Highest education level: Not on file  Occupational History  . Occupation: unemployeed  Social Needs  . Financial resource strain: Not hard at all  . Food insecurity    Worry: Never true    Inability: Never true  . Transportation needs    Medical: No    Non-medical: No  Tobacco Use  . Smoking status: Never Smoker  . Smokeless tobacco: Never Used  Substance and Sexual Activity  . Alcohol use: Never    Frequency: Never   . Drug use: Never  . Sexual activity: Never    Partners: Male  Lifestyle  . Physical activity    Days per week: 0 days    Minutes per session: 0 min  . Stress: Rather much  Relationships  . Social connections    Talks on phone: More than three times a week    Gets together: Never    Attends religious service: Never    Active member of club or organization:  No    Attends meetings of clubs or organizations: Never    Relationship status: Never married  . Intimate partner violence    Fear of current or ex partner: No    Emotionally abused: No    Physically abused: No    Forced sexual activity: No  Other Topics Concern  . Not on file  Social History Narrative  . Not on file     Current Outpatient Medications:  .  acetaminophen (TYLENOL) 500 MG tablet, Take 1,000 mg by mouth every 6 (six) hours as needed for moderate pain or headache., Disp: , Rfl:  .  acetaZOLAMIDE (DIAMOX) 500 MG capsule, Take 1 capsule (500 mg total) by mouth at bedtime., Disp: 90 capsule, Rfl: 0 .  aspirin-acetaminophen-caffeine (EXCEDRIN MIGRAINE) 250-250-65 MG tablet, Take 2 tablets by mouth every 6 (six) hours as needed for headache or migraine., Disp: , Rfl:  .  Brivaracetam 50 MG TABS, Take 50 mg by mouth daily., Disp: , Rfl:  .  cephALEXin (KEFLEX) 500 MG capsule, Take 500 mg by mouth 2 (two) times a day., Disp: , Rfl:  .  Cholecalciferol (D3-50) 1.25 MG (50000 UT) capsule, Take by mouth., Disp: , Rfl:  .  clindamycin (CLINDAGEL) 1 % gel, Apply 1 application topically daily as needed (flare up)., Disp: , Rfl:  .  diazepam (VALIUM) 5 MG tablet, Take 5 mg by mouth at bedtime as needed (for migraine). , Disp: , Rfl:  .  felbamate (FELBATOL) 600 MG tablet, Take 600 mg by mouth daily after lunch. , Disp: , Rfl:  .  HYDROcodone-acetaminophen (NORCO) 5-325 MG tablet, Take 1-2 tablets by mouth every 6 (six) hours as needed for moderate pain., Disp: 30 tablet, Rfl: 0 .  hydrocortisone 2.5 % cream, Apply topically  2 (two) times daily., Disp: 30 g, Rfl: 0 .  metoCLOPramide (REGLAN) 10 MG tablet, Take 1 tablet (10 mg total) by mouth daily as needed (for migraine)., Disp: 20 tablet, Rfl: 0 .  naproxen sodium (ALEVE) 220 MG tablet, Take 440 mg by mouth at bedtime as needed (for pain or headache)., Disp: , Rfl:  .  nystatin cream (MYCOSTATIN), Apply 1 application topically 2 (two) times daily. (Patient taking differently: Apply 1 application topically 2 (two) times daily as needed for dry skin. ), Disp: 30 g, Rfl: 0 .  omeprazole (PRILOSEC) 20 MG capsule, Take 1 capsule (20 mg total) by mouth at bedtime., Disp: 90 capsule, Rfl: 1 .  sodium chloride (OCEAN) 0.65 % SOLN nasal spray, Place 1 spray into both nostrils as needed for congestion., Disp: , Rfl:  .  triamcinolone (KENALOG) 0.025 % cream, Apply 1 application topically 2 (two) times daily., Disp: 30 g, Rfl: 0 .  TURMERIC CURCUMIN PO, Take 2 capsules by mouth daily., Disp: , Rfl:  .  Turmeric, Curcuma Longa, (CURCUMIN) POWD, Take by mouth., Disp: , Rfl:  .  verapamil (VERELAN PM) 240 MG 24 hr capsule, Take 1 capsule (240 mg total) by mouth at bedtime., Disp: 30 capsule, Rfl: 1 .  zinc gluconate ([employer] ZINC) 50 MG tablet, Take 50 mg by mouth daily. , Disp: , Rfl:  .  ergocalciferol (VITAMIN D2) 1.25 MG (50000 UT) capsule, Take 50,000 Units by mouth once a week. Sundays, Disp: , Rfl:  .  felbamate (FELBATOL) 400 MG tablet, Take 2 tablets (800 mg total) by mouth 2 (two) times daily. (Patient not taking: Reported on 04/10/2019), Disp: 60 tablet, Rfl: 0  Allergies  Allergen Reactions  . Codeine  Anaphylaxis  . Penicillins Anaphylaxis and Other (See Comments)    Has patient had a PCN reaction causing immediate rash, facial/tongue/throat swelling, SOB or lightheadedness with hypotension: Yes Has patient had a PCN reaction causing severe rash involving mucus membranes or skin necrosis: No Has patient had a PCN reaction that required hospitalization: Yes Has patient  had a PCN reaction occurring within the last 10 years: No If all of the above answers are "NO", then may proceed with Cephalosporin use.  Has patient had a PCN reaction causing immediate rash, facial/tongue/throat swelling, SOB or lightheadedness with hypotension: Yes Has patient had a PCN reaction causing severe rash involving mucus membranes or skin necrosis: No Has patient had a PCN reaction that required hospitalization: Yes Has patient had a PCN reaction occurring within the last 10 years: No If all of the above answers are "NO", then may proceed with Cephalosporin use. Has patient had a PCN reaction causing immediate rash, facial/tongue/throat swelling, SOB or lightheadedness with hypotension: Yes Has patient had a PCN reaction causing severe rash involving mucus membranes or skin necrosis: No Has patient had a PCN reaction that required hospitalization: Yes Has patient had a PCN reaction occurring within the last 10 years: No If all of the above answers are "NO", then may proceed with Cephalosporin use.  . Sulfa Antibiotics Hives  . Tape Other (See Comments)    Large Boils-PAPER TAPE OK TO USE Large Boils-PAPER TAPE OK TO USE     ROS  Constitutional: Negative for fever or weight change.  Respiratory: Negative for cough and shortness of breath.   Cardiovascular: Negative for chest pain or palpitations.  Gastrointestinal: Negative for abdominal pain, no bowel changes.  Musculoskeletal: Negative for gait problem or joint swelling.  Skin: Positive for rash on bilateral forearms - seeing Dermatology with upcoming appt.  Neurological: Negative for dizziness or headache.  No other specific complaints in a complete review of systems (except as listed in HPI above).   Objective  Vitals:   04/10/19 0848  BP: 118/70  Pulse: (!) 101  Resp: 16  Temp: 97.9 F (36.6 C)  TempSrc: Oral  SpO2: 96%  Weight: 224 lb 12.8 oz (102 kg)  Height: '5\' 4"'  (1.626 m)    Body mass index is  38.59 kg/m.  Auscultated Heart rate 82.  Physical Exam  Constitutional: Patient appears well-developed and well-nourished. No distress.  HENT: Head: Normocephalic and atraumatic. Ears: B TMs ok, no erythema or effusion; Nose: Nose normal. Mouth/Throat: Oropharynx is clear and moist. No oropharyngeal exudate.  Eyes: Conjunctivae and EOM are normal. Pupils are equal, round, and reactive to light. No scleral icterus.  Neck: Normal range of motion. Neck supple. No JVD present. No thyromegaly present.  Cardiovascular: Normal rate, regular rhythm and normal heart sounds.  No murmur heard. No BLE edema. Pulmonary/Chest: Effort normal and breath sounds normal. No respiratory distress. Abdominal: Soft. Bowel sounds are normal, no distension. There is no tenderness. no masses Breast: no lumps or masses, no nipple discharge or rashes FEMALE GENITALIA:  External genitalia normal External urethra normal Vaginal vault normal without discharge or lesions Cervix normal without discharge or lesions Bimanual exam normal without masses RECTAL: no rectal masses or hemorrhoids Musculoskeletal: Normal range of motion, no joint effusions. No gross deformities Neurological: he is alert and oriented to person, place, and time. No cranial nerve deficit. Coordination, balance, strength, speech and gait are normal.  Skin: Skin is warm and dry. No rash noted. No erythema.  Psychiatric: Patient  has a normal mood and affect. behavior is normal. Judgment and thought content normal.  No results found for this or any previous visit (from the past 2160 hour(s)).   Fall Risk: Fall Risk  04/10/2019 01/20/2019 08/11/2018 08/11/2018 07/30/2018  Falls in the past year? 0 0 0 0 0  Number falls in past yr: 0 0 - 0 -  Injury with Fall? 0 0 - 0 -  Follow up Falls evaluation completed Falls evaluation completed - - -   Assessment & Plan  1. Well woman exam (no gynecological exam) -USPSTF grade A and B recommendations reviewed  with patient; age-appropriate recommendations, preventive care, screening tests, etc discussed and encouraged; healthy living encouraged; see AVS for patient education given to patient -Discussed importance of 150 minutes of physical activity weekly, eat two servings of fish weekly, eat one serving of tree nuts ( cashews, pistachios, pecans, almonds.Marland Kitchen) every other day, eat 6 servings of fruit/vegetables daily and drink plenty of water and avoid sweet beverages.  - MM 3D SCREEN BREAST BILATERAL; Future - Lipid panel - HIV Antibody (routine testing w rflx) - COMPLETE METABOLIC PANEL WITH GFR - VITAMIN D 25 Hydroxy (Vit-D Deficiency, Fractures)  2. Anxiety - Ambulatory referral to Chronic Care Management Services  3. Encounter for screening mammogram for malignant neoplasm of breast - MM 3D SCREEN BREAST BILATERAL; Future  4. Mixed hyperlipidemia - Lipid panel  5. Encounter for screening for HIV - HIV Antibody (routine testing w rflx)  6. Diabetes mellitus screening - COMPLETE METABOLIC PANEL WITH GFR  7. Vitamin D deficiency - VITAMIN D 25 Hydroxy (Vit-D Deficiency, Fractures)

## 2019-04-10 NOTE — Patient Instructions (Addendum)
Apply Triamcinolone Cream that you have at home to arms as needed.   Preventive Care 60-39 Years Old, Female Preventive care refers to visits with your health care provider and lifestyle choices that can promote health and wellness. This includes:  A yearly physical exam. This may also be called an annual well check.  Regular dental visits and eye exams.  Immunizations.  Screening for certain conditions.  Healthy lifestyle choices, such as eating a healthy diet, getting regular exercise, not using drugs or products that contain nicotine and tobacco, and limiting alcohol use. What can I expect for my preventive care visit? Physical exam Your health care provider will check your:  Height and weight. This may be used to calculate body mass index (BMI), which tells if you are at a healthy weight.  Heart rate and blood pressure.  Skin for abnormal spots. Counseling Your health care provider may ask you questions about your:  Alcohol, tobacco, and drug use.  Emotional well-being.  Home and relationship well-being.  Sexual activity.  Eating habits.  Work and work Statistician.  Method of birth control.  Menstrual cycle.  Pregnancy history. What immunizations do I need?  Influenza (flu) vaccine  This is recommended every year. Tetanus, diphtheria, and pertussis (Tdap) vaccine  You may need a Td booster every 10 years. Varicella (chickenpox) vaccine  You may need this if you have not been vaccinated. Human papillomavirus (HPV) vaccine  If recommended by your health care provider, you may need three doses over 6 months. Measles, mumps, and rubella (MMR) vaccine  You may need at least one dose of MMR. You may also need a second dose. Meningococcal conjugate (MenACWY) vaccine  One dose is recommended if you are age 70-21 years and a first-year college student living in a residence hall, or if you have one of several medical conditions. You may also need additional  booster doses. Pneumococcal conjugate (PCV13) vaccine  You may need this if you have certain conditions and were not previously vaccinated. Pneumococcal polysaccharide (PPSV23) vaccine  You may need one or two doses if you smoke cigarettes or if you have certain conditions. Hepatitis A vaccine  You may need this if you have certain conditions or if you travel or work in places where you may be exposed to hepatitis A. Hepatitis B vaccine  You may need this if you have certain conditions or if you travel or work in places where you may be exposed to hepatitis B. Haemophilus influenzae type b (Hib) vaccine  You may need this if you have certain conditions. You may receive vaccines as individual doses or as more than one vaccine together in one shot (combination vaccines). Talk with your health care provider about the risks and benefits of combination vaccines. What tests do I need?  Blood tests  Lipid and cholesterol levels. These may be checked every 5 years starting at age 44.  Hepatitis C test.  Hepatitis B test. Screening  Diabetes screening. This is done by checking your blood sugar (glucose) after you have not eaten for a while (fasting).  Sexually transmitted disease (STD) testing.  BRCA-related cancer screening. This may be done if you have a family history of breast, ovarian, tubal, or peritoneal cancers.  Pelvic exam and Pap test. This may be done every 3 years starting at age 63. Starting at age 41, this may be done every 5 years if you have a Pap test in combination with an HPV test. Talk with your health care provider  about your test results, treatment options, and if necessary, the need for more tests. Follow these instructions at home: Eating and drinking   Eat a diet that includes fresh fruits and vegetables, whole grains, lean protein, and low-fat dairy.  Take vitamin and mineral supplements as recommended by your health care provider.  Do not drink alcohol  if: ? Your health care provider tells you not to drink. ? You are pregnant, may be pregnant, or are planning to become pregnant.  If you drink alcohol: ? Limit how much you have to 0-1 drink a day. ? Be aware of how much alcohol is in your drink. In the [place] one drink equals one 12 oz bottle of beer (355 mL), one 5 oz glass of wine (148 mL), or one 1 oz glass of hard liquor (44 mL). Lifestyle  Take daily care of your teeth and gums.  Stay active. Exercise for at least 30 minutes on 5 or more days each week.  Do not use any products that contain nicotine or tobacco, such as cigarettes, e-cigarettes, and chewing tobacco. If you need help quitting, ask your health care provider.  If you are sexually active, practice safe sex. Use a condom or other form of birth control (contraception) in order to prevent pregnancy and STIs (sexually transmitted infections). If you plan to become pregnant, see your health care provider for a preconception visit. What's next?  Visit your health care provider once a year for a well check visit.  Ask your health care provider how often you should have your eyes and teeth checked.  Stay up to date on all vaccines. This information is not intended to replace advice given to you by your health care provider. Make sure you discuss any questions you have with your health care provider. Document Released: 08/14/2001 Document Revised: 02/27/2018 Document Reviewed: 02/27/2018 Elsevier Patient Education  2020 [employer].

## 2019-04-11 LAB — COMPLETE METABOLIC PANEL WITH GFR
AG Ratio: 1.6 (calc) (ref 1.0–2.5)
ALT: 15 U/L (ref 6–29)
AST: 14 U/L (ref 10–30)
Albumin: 4.3 g/dL (ref 3.6–5.1)
Alkaline phosphatase (APISO): 73 U/L (ref 31–125)
BUN: 17 mg/dL (ref 7–25)
CO2: 24 mmol/L (ref 20–32)
Calcium: 9.6 mg/dL (ref 8.6–10.2)
Chloride: 106 mmol/L (ref 98–110)
Creat: 0.75 mg/dL (ref 0.50–1.10)
GFR, Est African American: 116 mL/min/{1.73_m2} (ref >=60)
GFR, Est Non African American: 100 mL/min/{1.73_m2} (ref >=60)
Globulin: 2.7 g/dL (calc) (ref 1.9–3.7)
Glucose, Bld: 87 mg/dL (ref 65–99)
Potassium: 3.8 mmol/L (ref 3.5–5.3)
Sodium: 138 mmol/L (ref 135–146)
Total Bilirubin: 0.4 mg/dL (ref 0.2–1.2)
Total Protein: 7 g/dL (ref 6.1–8.1)

## 2019-04-11 LAB — LIPID PANEL
Cholesterol: 206 mg/dL — ABNORMAL HIGH (ref <200)
HDL: 48 mg/dL — ABNORMAL LOW (ref >=50)
LDL Cholesterol (Calc): 132 mg/dL (calc) — ABNORMAL HIGH
Non-HDL Cholesterol (Calc): 158 mg/dL (calc) — ABNORMAL HIGH (ref <130)
Total CHOL/HDL Ratio: 4.3 (calc) (ref <5.0)
Triglycerides: 146 mg/dL (ref <150)

## 2019-04-11 LAB — HIV ANTIBODY (ROUTINE TESTING W REFLEX): HIV 1&2 Ab, 4th Generation: NONREACTIVE

## 2019-04-11 LAB — VITAMIN D 25 HYDROXY (VIT D DEFICIENCY, FRACTURES): Vit D, 25-Hydroxy: 56 ng/mL (ref 30–100)

## 2019-04-13 ENCOUNTER — Ambulatory Visit: Payer: Self-pay | Admitting: *Deleted

## 2019-04-13 NOTE — Chronic Care Management (AMB) (Signed)
   Care Management   Unsuccessful Call Note 04/13/2019 Name: Destiny Flores MRN: [id number] DOB: May 08, 1981  Patient  is a 38 year old female who sees Raelyn Ensign, FNP for primary care. Raelyn Ensign, FNP asked the CCM team to consult the patient for resources for christian counseling. Referral was placed 04/10/19. Patient's last office visit was 04/10/19.     This social worker was unable to reach patient via telephone today to provide resources for christian counseling that take medicaid or sliding scale. [facility] identified [phone] as well as possibly [facility](message left for a return call) as well as [facility] [phone].  I have left HIPAA compliant voicemail asking patient to return my call. (unsuccessful outreach #1).   Plan: Will follow-up within 7 business days via telephone.     Elliot Gurney, [facility] [phone]

## 2019-04-28 ENCOUNTER — Encounter: Payer: Self-pay | Admitting: *Deleted

## 2019-04-28 NOTE — Progress Notes (Signed)
This encounter was created in error - please disregard.

## 2019-04-29 NOTE — Chronic Care Management (AMB) (Signed)
.  Chronic Care Management    Clinical Social Work General Note  04/29/2019 Name: Destiny Flores MRN: [id number] DOB: Mar 17, 1981  Destiny Flores is a 38 y.o. year old female who is a primary care patient of Destiny Hartshorn, FNP. The CCM was consulted to assist the patient with Mental Health Counseling and Resources.   Destiny Flores was given information about  Care Management services today including:  1. CCM service includes personalized support from designated clinical staff supervised by her physician, including individualized plan of care and coordination with other care providers 2. 24/7 contact phone numbers for assistance for urgent and routine care needs. 3. The patient may stop CCM services at any time (effective at the end of the month) by phone call to the office staff.   Patient agreed to services and verbal consent obtained.   Review of patient status, including review of consultants reports, relevant laboratory and other test results, and collaboration with appropriate care team members and the patient's provider was performed as part of comprehensive patient evaluation and provision of chronic care management services.    SDOH (Social Determinants of Health) screening performed today. See Care Plan Entry related to challenges with: Depression   Physical Activity  Advanced Directives Status: <no information> See Care Plan for related entries.   Outpatient Encounter Medications as of 04/28/2019  Medication Sig  . acetaminophen (TYLENOL) 500 MG tablet Take 1,000 mg by mouth every 6 (six) hours as needed for moderate pain or headache.  Marland Kitchen aspirin-acetaminophen-caffeine (EXCEDRIN MIGRAINE) 250-250-65 MG tablet Take 2 tablets by mouth every 6 (six) hours as needed for headache or migraine.  . Brivaracetam 50 MG TABS Take 50 mg by mouth daily.  . cephALEXin (KEFLEX) 500 MG capsule Take 500 mg by mouth 2 (two) times a day.  . Cholecalciferol (D3-50) 1.25 MG (50000 UT) capsule Take by  mouth.  . clindamycin (CLINDAGEL) 1 % gel Apply 1 application topically daily as needed (flare up).  . diazepam (VALIUM) 5 MG tablet Take 5 mg by mouth at bedtime as needed (for migraine).   . ergocalciferol (VITAMIN D2) 1.25 MG (50000 UT) capsule Take 50,000 Units by mouth once a week. Sundays  . felbamate (FELBATOL) 600 MG tablet Take 600 mg by mouth 3 (three) times daily.  Marland Kitchen HYDROcodone-acetaminophen (NORCO) 5-325 MG tablet Take 1-2 tablets by mouth every 6 (six) hours as needed for moderate pain.  Marland Kitchen metoCLOPramide (REGLAN) 10 MG tablet Take 1 tablet (10 mg total) by mouth daily as needed (for migraine).  . naproxen sodium (ALEVE) 220 MG tablet Take 440 mg by mouth at bedtime as needed (for pain or headache).  . nystatin cream (MYCOSTATIN) Apply 1 application topically 2 (two) times daily. (Patient taking differently: Apply 1 application topically 2 (two) times daily as needed for dry skin. )  . omeprazole (PRILOSEC) 20 MG capsule Take 1 capsule (20 mg total) by mouth at bedtime.  . sodium chloride (OCEAN) 0.65 % SOLN nasal spray Place 1 spray into both nostrils as needed for congestion.  . triamcinolone (KENALOG) 0.025 % cream Apply 1 application topically 2 (two) times daily.  . TURMERIC CURCUMIN PO Take 2 capsules by mouth daily.  . Turmeric, Curcuma Longa, (CURCUMIN) POWD Take by mouth.   No facility-administered encounter medications on file as of 04/28/2019.     Goals Addressed            This Visit's Progress   . "I would like to be set up with a  christian counselor" (pt-stated)       Current Barriers:  . Chronic Mental Health needs related to symptoms of depression . Mental Health Concerns  . Suicidal Ideation/Homicidal Ideation: No  Clinical Social Work Goal(s):  Marland Kitchen Over the next 90 days, patient will work with SW bi-weekly by telephone or in person to identify faith based mental health counselors in the community for ongoing mental health therapy . Over the next 90 days,  patient will work with [facility] to address needs related to need for a faith based therapist  Interventions: . Patient interviewed and appropriate assessments performed: brief mental health assessment . Patient interviewed and appropriate assessments performed . Provided patient with information about local therapist in the area that incorporate faith base principals into their treatment . Collaborated with [facility] (community agency) re: options for christian counseling . Provided patient with the contact phone number for [facility] to call for initial appointment  .   Patient Self Care Activities:  . Attends all scheduled provider appointments . Performs ADL's independently . Performs IADL's independently . Motivation for treatment  Patient Coping Strengths:  . Hopefulness . Self Advocate . Able to Communicate Effectively  Patient Self Care Deficits:  Marland Kitchen Knowledge deficit regarding faith based counselors that accept her insurance  Initial goal documentation         Follow Up Plan: SW will follow up with patient by phone over the next 2 weeks        , LCSW Clinical Social Worker  [facility] Care Management [phone]

## 2019-04-29 NOTE — Patient Instructions (Signed)
Thank you allowing the Chronic Care Management Team to be a part of your care! It was a pleasure speaking with you today!  1. Please contact [facility] to schedule an initial appointment with a faith based counselor  CCM (Chronic Care Management) Team   Neldon Labella RN, BSN Nurse Care Coordinator  [phone]  Ruben Reason PharmD  Clinical Pharmacist  [phone]   [place], LCSW Clinical Social Worker [phone]  Goals Addressed            This Visit's Progress   . "I would like to be set up with a christian counselor" (pt-stated)       Current Barriers:  . Chronic Mental Health needs related to symptoms of depression . Mental Health Concerns  . Suicidal Ideation/Homicidal Ideation: No  Clinical Social Work Goal(s):  Marland Kitchen Over the next 90 days, patient will work with SW bi-weekly by telephone or in person to identify faith based mental health counselors in the community for ongoing mental health therapy . Over the next 90 days, patient will work with [facility] to address needs related to need for a faith based therapist  Interventions: . Patient interviewed and appropriate assessments performed: brief mental health assessment . Patient interviewed and appropriate assessments performed . Provided patient with information about local therapist in the area that incorporate faith base principals into their treatment . Collaborated with [facility] (community agency) re: options for christian counseling . Provided patient with the contact phone number for [facility] to call for initial appointment  .   Patient Self Care Activities:  . Attends all scheduled provider appointments . Performs ADL's independently . Performs IADL's independently . Motivation for treatment  Patient Coping Strengths:  . Hopefulness . Self Advocate . Able to Communicate Effectively  Patient Self Care Deficits:  Marland Kitchen Knowledge deficit regarding faith based  counselors that accept her insurance  Initial goal documentation         The patient verbalized understanding of instructions provided today and declined a print copy of patient instruction materials.   Telephone follow up appointment with care management team member scheduled for:05/07/19

## 2019-05-03 DIAGNOSIS — Z20828 Contact with and (suspected) exposure to other viral communicable diseases: Secondary | ICD-10-CM | POA: Diagnosis not present

## 2019-05-07 ENCOUNTER — Ambulatory Visit: Payer: Self-pay | Admitting: *Deleted

## 2019-05-07 DIAGNOSIS — F419 Anxiety disorder, unspecified: Secondary | ICD-10-CM

## 2019-05-07 NOTE — Patient Instructions (Signed)
Thank you allowing the Chronic Care Management Team to be a part of your care! It was a pleasure speaking with you today!  1. Please follow up with [facility] regarding your initial intake appointment for ongoing mental health counseling 2. Please call this social worker if there are any questions regarding your mental health/community resource needs.  CCM (Chronic Care Management) Team   Neldon Labella, RN, BSN Nurse Care Coordinator  [phone]  Ruben Reason PharmD  Clinical Pharmacist  [phone]   Elliot Gurney, LCSW Clinical Social Worker [phone]  Goals Addressed            This Visit's Progress   . "I would like to be set up with a christian counselor" (pt-stated)       Current Barriers:  . Chronic Mental Health needs related to symptoms of depression . Mental Health Concerns  . Suicidal Ideation/Homicidal Ideation: No  Clinical Social Work Goal(s):  Marland Kitchen Over the next 90 days, patient will work with SW bi-weekly by telephone or in person to identify faith based mental health counselors in the community for ongoing mental health therapy . Over the next 90 days, patient will work with [facility] to address needs related to need for a faith based therapist  Interventions: . Confirmed today that patient has completed the intake paperwork for [facility] and is now waiting on a call to schedule the initial intake appointment . Provided patient with positive reinforcement for taking the initiative  to complete the intake paperwork for mental health counseling through [facility]  . Assessed for any additional community resource needs  Patient Self Care Activities:  . Attends all scheduled provider appointments . Performs ADL's independently . Performs IADL's independently . Motivation for treatment  Patient Coping Strengths:  . Hopefulness . Self Advocate . Able to Communicate Effectively  Patient Self Care Deficits:  Marland Kitchen Knowledge  deficit regarding faith based counselors that accept her insurance  Please see past updates related to this goal by clicking on the "Past Updates" button in the selected goal          The patient verbalized understanding of instructions provided today and declined a print copy of patient instruction materials.   Telephone follow up appointment with care management team member scheduled for:05/21/19

## 2019-05-07 NOTE — Chronic Care Management (AMB) (Signed)
Care Management    Clinical Social Work General Note  05/07/2019 Name: Destiny Flores MRN: [id number] DOB: [birth year]  Destiny Flores is a 38 y.o. year old female who is a primary care patient of Doren Custard, FNP. The CCM was consulted to assist the patient with Mental Health Counseling and Resources.   Ms. Siefken was given information about Care Management services today including:  1. CM service includes personalized support from designated clinical staff supervised by her physician, including individualized plan of care and coordination with other care providers 2. 24/7 contact phone numbers for assistance for urgent and routine care needs. The patient may stop CCM services at any time (effective at the end of the month) by phone call to the office staff. Patient agreed to services and verbal consent obtained.   Review of patient status, including review of consultants reports, relevant laboratory and other test results, and collaboration with appropriate care team members and the patient's provider was performed as part of comprehensive patient evaluation and provision of chronic care management services.    SDOH (Social Determinants of Health) screening performed on 04/28/19. See Care Plan Entry related to challenges with: Stress Physical Activity  Outpatient Encounter Medications as of 05/07/2019  Medication Sig  . acetaminophen (TYLENOL) 500 MG tablet Take 1,000 mg by mouth every 6 (six) hours as needed for moderate pain or headache.  Marland Kitchen aspirin-acetaminophen-caffeine (EXCEDRIN MIGRAINE) 250-250-65 MG tablet Take 2 tablets by mouth every 6 (six) hours as needed for headache or migraine.  . Brivaracetam 50 MG TABS Take 50 mg by mouth daily.  . cephALEXin (KEFLEX) 500 MG capsule Take 500 mg by mouth 2 (two) times a day.  . Cholecalciferol (D3-50) 1.25 MG (50000 UT) capsule Take by mouth.  . clindamycin (CLINDAGEL) 1 % gel Apply 1 application topically daily as needed (flare up).  .  diazepam (VALIUM) 5 MG tablet Take 5 mg by mouth at bedtime as needed (for migraine).   . ergocalciferol (VITAMIN D2) 1.25 MG (50000 UT) capsule Take 50,000 Units by mouth once a week. Sundays  . felbamate (FELBATOL) 600 MG tablet Take 600 mg by mouth 3 (three) times daily.  Marland Kitchen HYDROcodone-acetaminophen (NORCO) 5-325 MG tablet Take 1-2 tablets by mouth every 6 (six) hours as needed for moderate pain.  Marland Kitchen metoCLOPramide (REGLAN) 10 MG tablet Take 1 tablet (10 mg total) by mouth daily as needed (for migraine).  . naproxen sodium (ALEVE) 220 MG tablet Take 440 mg by mouth at bedtime as needed (for pain or headache).  . nystatin cream (MYCOSTATIN) Apply 1 application topically 2 (two) times daily. (Patient taking differently: Apply 1 application topically 2 (two) times daily as needed for dry skin. )  . omeprazole (PRILOSEC) 20 MG capsule Take 1 capsule (20 mg total) by mouth at bedtime.  . sodium chloride (OCEAN) 0.65 % SOLN nasal spray Place 1 spray into both nostrils as needed for congestion.  . triamcinolone (KENALOG) 0.025 % cream Apply 1 application topically 2 (two) times daily.  . TURMERIC CURCUMIN PO Take 2 capsules by mouth daily.  . Turmeric, Curcuma Longa, (CURCUMIN) POWD Take by mouth.   No facility-administered encounter medications on file as of 05/07/2019.     Goals Addressed            This Visit's Progress   . "I would like to be set up with a christian counselor" (pt-stated)       Current Barriers:  . Chronic Mental Health needs related to symptoms of  depression . Mental Health Concerns  . Suicidal Ideation/Homicidal Ideation: No  Clinical Social Work Goal(s):  Marland Kitchen Over the next 90 days, patient will work with SW bi-weekly by telephone or in person to identify faith based mental health counselors in the community for ongoing mental health therapy . Over the next 90 days, patient will work with [facility] to address needs related to need for a faith based therapist   Interventions: . Confirmed today that patient has completed the intake paperwork for [facility] and is now waiting on a call to schedule the initial intake appointment . Provided patient with positive reinforcement for taking the initiative  to complete the intake paperwork for mental health counseling through [facility]  . Assessed for any additional community resource needs  Patient Self Care Activities:  . Attends all scheduled provider appointments . Performs ADL's independently . Performs IADL's independently . Motivation for treatment  Patient Coping Strengths:  . Hopefulness . Self Advocate . Able to Communicate Effectively  Patient Self Care Deficits:  Marland Kitchen Knowledge deficit regarding faith based counselors that accept her insurance  Please see past updates related to this goal by clicking on the "Past Updates" button in the selected goal          Follow Up Plan: SW will follow up with patient by phone over the next 2 weeks regarding referral for ongoing mental health therapy       [place], [place] Worker  [facility] Care Management [phone]

## 2019-05-13 ENCOUNTER — Encounter: Admit: 2019-05-13 | Discharge: 2019-05-14 | Payer: BLUE CROSS/BLUE SHIELD

## 2019-05-13 DIAGNOSIS — L309 Dermatitis, unspecified: Principal | ICD-10-CM

## 2019-05-13 DIAGNOSIS — Z79899 Other long term (current) drug therapy: Principal | ICD-10-CM

## 2019-05-13 DIAGNOSIS — L732 Hidradenitis suppurativa: Principal | ICD-10-CM

## 2019-05-13 DIAGNOSIS — L408 Other psoriasis: Secondary | ICD-10-CM | POA: Diagnosis not present

## 2019-05-13 MED ORDER — CLOBETASOL 0.05 % TOPICAL OINTMENT
Freq: Two times a day (BID) | TOPICAL | 5 refills | 0.00000 days | Status: CP
Start: 2019-05-13 — End: ?

## 2019-05-21 ENCOUNTER — Telehealth: Payer: Self-pay

## 2019-05-21 ENCOUNTER — Ambulatory Visit: Payer: Self-pay | Admitting: *Deleted

## 2019-05-21 NOTE — Chronic Care Management (AMB) (Signed)
   Care Management   Unsuccessful Call Note 05/21/2019 Name: Destiny Flores MRN: [id number] DOB: 05-12-1981  Patient  is a 38 year old female who sees Raelyn Ensign, FNP for primary care. Raelyn Ensign, FNP asked the CCM team to consult the patient for Mental Health Counseling and Resources.      This social worker was unable to reach patient via telephone today for follow up call regarding mental health resources previously provided. I have left HIPAA compliant voicemail asking patient to return my call. (unsuccessful outreach #1).   Plan: Will follow-up within 7-10 business days via telephone.     Elliot Gurney, [place] [facility] [phone]

## 2019-05-25 DIAGNOSIS — L732 Hidradenitis suppurativa: Secondary | ICD-10-CM | POA: Diagnosis not present

## 2019-06-01 ENCOUNTER — Telehealth: Payer: Self-pay

## 2019-06-01 ENCOUNTER — Ambulatory Visit: Payer: Self-pay | Admitting: *Deleted

## 2019-06-01 DIAGNOSIS — G40219 Localization-related (focal) (partial) symptomatic epilepsy and epileptic syndromes with complex partial seizures, intractable, without status epilepticus: Secondary | ICD-10-CM | POA: Diagnosis not present

## 2019-06-01 NOTE — Chronic Care Management (AMB) (Signed)
    Care Management   Unsuccessful Call Note 06/01/2019 Name: Destiny Flores MRN: [id number] DOB: [birth year]  Patient  is a 38 year old female who sees Raelyn Ensign, FNP for primary care. Raelyn Ensign, asked the CCM team to consult the patient for Mental Health Counseling and Resources.      This social worker was unable to reach patient via telephone today to follow up on mental health resources provided previously. I have left HIPAA compliant voicemail asking patient to return my call. (unsuccessful outreach #2).   Plan: Will follow-up within 7 business days via telephone.     Elliot Gurney, [place] [facility] Management [phone]

## 2019-06-03 MED ORDER — ADALIMUMAB PEN CITRATE FREE 40 MG/0.4 ML
SUBCUTANEOUS | 11 refills | 0.00000 days | Status: CP
Start: 2019-06-03 — End: ?
  Filled 2019-06-04: qty 4, 28d supply, fill #0

## 2019-06-03 NOTE — Unmapped (Signed)
[facility]   Patient Onboarding/Medication Counseling    Erica Norman is a 38 y.o. female with hidradenitis who I am counseling today on continuation of therapy.  I am speaking to the patient.    Verified patient's date of birth / HIPAA.    Specialty medication(s) to be sent: Inflammatory Disorders: Humira      Non-specialty medications/supplies to be sent: na (has sharps)      Medications not needed at this time: na         Humira (adalimumab)    Medication & Administration     Dosage: HS maintenance dosing: inject the contents of 1 pen (40 mg) under the skin every 7 days    Lab tests required prior to treatment initiation:  ? Tuberculosis: Tuberculosis screening resulted in a non-reactive Quantiferon TB Gold assay.  ? Hepatitis B: Hepatitis B serology studies are complete and non-reactive.    Administration:     Prefilled auto-injector pen  1. Gather all supplies needed for injection on a clean, flat working surface: medication pen removed from packaging, alcohol swab, sharps container, etc.  2. Look at the medication label - look for correct medication, correct dose, and check the expiration date  3. Look at the medication - the liquid visible in the window on the side of the pen device should appear clear and colorless  4. Lay the auto-injector pen on a flat surface and allow it to warm up to room temperature for at least 30-45 minutes  5. Select injection site - you can use the front of your thigh or your belly (but not the area 2 inches around your belly button); if someone else is giving you the injection you can also use your upper arm in the skin covering your triceps muscle  6. Prepare injection site - wash your hands and clean the skin at the injection site with an alcohol swab and let it air dry, do not touch the injection site again before the injection 7. Pull the 2 safety caps straight off - gray/white to uncover the needle cover and the plum cap to uncover the plum activator button, do not remove until immediately prior to injection and do not touch the white needle cover  8. Gently squeeze the area of cleaned skin and hold it firmly to create a firm surface at the selected injection site  9. Put the white needle cover against your skin at the injection site at a 90 degree angle, hold the pen such that you can see the clear medication window  10. Press down and hold the pen firmly against your skin, press the plum activator button to initiate the injection, there will be a click when the injection starts  11. Continue to hold the pen firmly against your skin for about 10-15 seconds - the window will start to turn solid yellow  12. To verify the injection is complete after 10-15 seconds, look and ensure the window is solid yellow and then pull the pen away from your skin  13. Dispose of the used auto-injector pen immediately in your sharps disposal container the needle will be covered automatically  14. If you see any blood at the injection site, press a cotton ball or gauze on the site and maintain pressure until the bleeding stops, do not rub the injection site    Adherence/Missed dose instructions:  If your injection is given more than 3 days after your scheduled injection date ??? consult your pharmacist for  additional instructions on how to adjust your dosing schedule.    Goals of Therapy     - Reduce the frequency and severity of new lesions  - Minimize pain and suppuration  - Prevent disease progression and limit scarring  - Maintenance of effective psychosocial functioning    Side Effects & Monitoring Parameters     ? Injection site reaction (redness, irritation, inflammation localized to the site of administration)  ? Signs of a common cold ??? minor sore throat, runny or stuffy nose, etc.  ? Upset stomach  ? Headache The following side effects should be reported to the provider:  ? Signs of a hypersensitivity reaction ??? rash; hives; itching; red, swollen, blistered, or peeling skin; wheezing; tightness in the chest or throat; difficulty breathing, swallowing, or talking; swelling of the mouth, face, lips, tongue, or throat; etc.  ? Reduced immune function ??? report signs of infection such as fever; chills; body aches; very bad sore throat; ear or sinus pain; cough; more sputum or change in color of sputum; pain with passing urine; wound that will not heal, etc.  Also at a slightly higher risk of some malignancies (mainly skin and blood cancers) due to this reduced immune function.  o In the case of signs of infection ??? the patient should hold the next dose of Humira?? and call your primary care provider to ensure adequate medical care.  Treatment may be resumed when infection is treated and patient is asymptomatic.  ? Changes in skin ??? a new growth or lump that forms; changes in shape, size, or color of a previous mole or marking  ? Signs of unexplained bruising or bleeding ??? throwing up blood or emesis that looks like coffee grounds; black, tarry, or bloody stool; etc.  ? Signs of new or worsening heart failure ??? shortness of breath; sudden weight gain; heartbeat that is not normal; swelling in the arms or legs that is new or worse      Contraindications, Warnings, & Precautions     ? Have your bloodwork checked as you have been told by your prescriber  ? Talk with your doctor if you are pregnant, planning to become pregnant, or breastfeeding  ? Discuss the possible need for holding your dose(s) of Humira?? when a planned procedure is scheduled with the prescriber as it may delay healing/recovery timeline       Drug/Food Interactions     ? Medication list reviewed in Epic. The patient was instructed to inform the care team before taking any new medications or supplements. No drug interactions identified. ? Talk with you prescriber or pharmacist before receiving any live vaccinations while taking this medication and after you stop taking it    Storage, Handling Precautions, & Disposal     ? Store this medication in the refrigerator.  Do not freeze  ? If needed, you may store at room temperature for up to 14 days  ? Store in [employer], protected from light  ? Do not shake  ? Dispose of used syringes/pens in a sharps disposal container            Current Medications (including OTC/herbals), Comorbidities and Allergies     Current Outpatient Medications   Medication Sig Dispense Refill   ??? ADALIMUMAB PEN CITRATE FREE 40 MG/0.4 ML Inject the contents of 1 pen (40 mg) under the skin once weekly 4 each 11   ??? brivaracetam (BRIVIACT) 50 mg tablet Take 50 mg by mouth.     ???  cholecalciferol, vitamin D3, 1,250 mcg (50,000 unit) capsule Take 50,000 Units by mouth.     ??? clindamycin (CLINDAGEL) 1 % gel Apply topically.     ??? clobetasoL (TEMOVATE) 0.05 % ointment Apply topically Two (2) times a day. 60 g 5   ??? diazePAM (VALIUM) 5 MG tablet Take 5 mg by mouth.     ??? diazePAM (VALIUM) 5 MG tablet Take 5 mg by mouth every hour as needed.     ??? FELBATOL 600 mg tablet      ??? nystatin (MYCOSTATIN) 100,000 unit/gram cream Apply topically.     ??? omeprazole (PRILOSEC) 20 MG capsule Take 20 mg by mouth.     ??? omeprazole (PRILOSEC) 20 MG capsule Take 20 mg by mouth.     ??? pyridoxine, vitamin B6, (B-6) 100 MG tablet Take 100 mg by mouth.     ??? sodium chloride (AYR) 0.65 % Drop 1 spray into each nostril.     ??? turmeric, bulk, 95 % Powd Take 2 capsules by mouth.       No current facility-administered medications for this visit.        Allergies   Allergen Reactions   ??? Codeine Anaphylaxis   ??? Penicillins Anaphylaxis and Other (See Comments)     Has patient had a PCN reaction causing immediate rash, facial/tongue/throat swelling, SOB or lightheadedness with hypotension: Yes Has patient had a PCN reaction causing severe rash involving mucus membranes or skin necrosis: No  Has patient had a PCN reaction that required hospitalization: Yes  Has patient had a PCN reaction occurring within the last 10 years: No  If all of the above answers are NO, then may proceed with Cephalosporin use.  Has patient had a PCN reaction causing immediate rash, facial/tongue/throat swelling, SOB or lightheadedness with hypotension: Yes  Has patient had a PCN reaction causing severe rash involving mucus membranes or skin necrosis: No  Has patient had a PCN reaction that required hospitalization: Yes  Has patient had a PCN reaction occurring within the last 10 years: No  If all of the above answers are NO, then may proceed with Cephalosporin use.     ??? Sulfa (Sulfonamide Antibiotics) Hives   ??? Adhesive Tape-Silicones Other (See Comments)     Large Boils-PAPER TAPE OK TO USE       There is no problem list on file for this patient.      Reviewed and up to date in Epic.    Appropriateness of Therapy     Is medication and dose appropriate based on diagnosis? Yes    Baseline Quality of Life Assessment      How many days over the past month did your HS keep you from your normal activities? 0    Financial Information     Medication Assistance provided: None Required    Anticipated copay of $3 reviewed with patient. Verified delivery address.    Delivery Information     Scheduled delivery date: Thurs, Dec 3    Expected start date: Thurs Dec 3 (continuation of treatment, overdue for dose)    Medication will be delivered via Same Day Courier to the prescription address in [place].  This shipment will not require a signature. Explained the services we provide at [facility] and that each month we would call to set up refills.  Stressed importance of returning phone calls so that we could ensure they receive their medications in time each month.  Informed patient  that we should be setting up refills 7-10 days prior to when they will run out of medication.  A pharmacist will reach out to perform a clinical assessment periodically.  Informed patient that a welcome packet and a drug information handout will be sent.      Patient verbalized understanding of the above information as well as how to contact the pharmacy at [phone] option 4 with any questions/concerns.  The pharmacy is open Monday through Friday 8:30am-4:30pm.  A pharmacist is available 24/7 via pager to answer any clinical questions they may have.    Patient Specific Needs     ? Does the patient have any physical, cognitive, or cultural barriers? No    ? Patient prefers to have medications discussed with  Patient     ? Is the patient able to read and understand education materials at a high school level or above? No    ? Patient's primary language is  English     ? Is the patient high risk? No     ? Does the patient require a Care Management Plan? No     ? Does the patient require physician intervention or other additional services (i.e. nutrition, smoking cessation, social work)? No      Erica Norman A Desiree Lucy [facility] Specialty Pharmacist

## 2019-06-03 NOTE — Unmapped (Signed)
[facility] Specialty Medication Onboarding    Specialty Medication: Humira  Prior Authorization: Approved   Financial Assistance: No - copay  <$25  Final Copay/Day Supply: 3.00 / 28    Insurance Restrictions: None     Notes to Pharmacist: Unable to test full claim due to Medicaid limitations    The triage team has completed the benefits investigation and has determined that the patient is able to fill this medication at [facility]. Please contact the patient to complete the onboarding or follow up with the prescribing physician as needed.

## 2019-06-03 NOTE — Unmapped (Signed)
Thank you. Will do!

## 2019-06-03 NOTE — Unmapped (Signed)
Addended by: Tammi Sou on: 06/03/2019 09:35 AM     Modules accepted: Orders

## 2019-06-04 MED FILL — HUMIRA PEN CITRATE FREE 40 MG/0.4 ML: 28 days supply | Qty: 4 | Fill #0 | Status: AC

## 2019-06-08 ENCOUNTER — Telehealth: Payer: Self-pay

## 2019-06-08 ENCOUNTER — Ambulatory Visit: Payer: Self-pay | Admitting: *Deleted

## 2019-06-08 NOTE — Chronic Care Management (AMB) (Signed)
    Care Management   Unsuccessful Call Note 06/08/2019 Name: Destiny Flores MRN: [id number] DOB: 08-09-1980  Patient  is a 38 year old female who sees Raelyn Ensign, FNP for primary care. Raelyn Ensign, FNP asked the CCM team to consult the patient for [place] and Resources.     This social worker was unable to reach patient via telephone today for follow up call regarding mental health resources made. I have left HIPAA compliant voicemail asking patient to return my call. (unsuccessful outreach #3).   Plan: This Education officer, museum will not make any additional attempts to contact patient, however will be happy to engage patient upon her return call.     Elliot Gurney, [place] Administrator, arts Center/[facility] Care Management [phone]

## 2019-06-22 NOTE — Unmapped (Signed)
[facility] Refill Coordination Note    Specialty Medication(s) to be Shipped:   Inflammatory Disorders: Humira    Other medication(s) to be shipped: n/a     Erica Norman, DOB: Sep 07, 1980  Phone: [phone] (home)       All above HIPAA information was verified with patient.     Was a Nurse, learning disability used for this call? No    Completed refill call assessment today to schedule patient's medication shipment from the [facility] [phone]).       Specialty medication(s) and dose(s) confirmed: Regimen is correct and unchanged.   Changes to medications: Erica Norman reports no changes at this time.  Changes to insurance: No  Questions for the pharmacist: No    Confirmed patient received Welcome Packet with first shipment. The patient will receive a drug information handout for each medication shipped and additional FDA Medication Guides as required.       DISEASE/MEDICATION-SPECIFIC INFORMATION        For patients on injectable medications: Patient currently has 2 doses left.  Next injection is scheduled for 06/22/2019.    SPECIALTY MEDICATION ADHERENCE     Medication Adherence    Patient reported X missed doses in the last month: 0  Specialty Medication: Humira CF 40 mg/0.4 ml  Patient is on additional specialty medications: No  Any gaps in refill history greater than 2 weeks in the last 3 months: no  Demonstrates understanding of importance of adherence: yes  Informant: patient  Reliability of informant: reliable  Confirmed plan for next specialty medication refill: delivery by pharmacy  Refills needed for supportive medications: not needed                Humira CF 40 mg/0.4 ml. 14 days on ahnd      SHIPPING     Shipping address confirmed in Epic.     Delivery Scheduled: Yes, Expected medication delivery date: 07/01/2019.     Medication will be delivered via Same Day Courier to the prescription address in Epic [facility].    Erica Norman Erica Norman [facility] [place] [facility] Specialty Technician

## 2019-07-01 MED FILL — HUMIRA PEN CITRATE FREE 40 MG/0.4 ML: SUBCUTANEOUS | 28 days supply | Qty: 4 | Fill #1

## 2019-07-01 MED FILL — HUMIRA PEN CITRATE FREE 40 MG/0.4 ML: 28 days supply | Qty: 4 | Fill #1 | Status: AC

## 2019-07-13 DIAGNOSIS — Z23 Encounter for immunization: Secondary | ICD-10-CM | POA: Diagnosis not present

## 2019-07-13 MED ORDER — MELATONIN 5 MG TABLET
ORAL | 0.00000 days
Start: 2019-07-13 — End: ?

## 2019-07-14 ENCOUNTER — Encounter: Payer: Self-pay | Admitting: Family Medicine

## 2019-07-14 ENCOUNTER — Other Ambulatory Visit: Payer: Self-pay

## 2019-07-14 ENCOUNTER — Ambulatory Visit (INDEPENDENT_AMBULATORY_CARE_PROVIDER_SITE_OTHER): Payer: Medicaid Other | Admitting: Family Medicine

## 2019-07-14 VITALS — BP 118/72 | HR 100 | Temp 97.8°F | Resp 16 | Ht 64.0 in | Wt 230.0 lb

## 2019-07-14 DIAGNOSIS — K219 Gastro-esophageal reflux disease without esophagitis: Secondary | ICD-10-CM

## 2019-07-14 DIAGNOSIS — G40909 Epilepsy, unspecified, not intractable, without status epilepticus: Secondary | ICD-10-CM | POA: Diagnosis not present

## 2019-07-14 DIAGNOSIS — R0981 Nasal congestion: Secondary | ICD-10-CM

## 2019-07-14 DIAGNOSIS — E6609 Other obesity due to excess calories: Secondary | ICD-10-CM | POA: Diagnosis not present

## 2019-07-14 DIAGNOSIS — E66812 Other obesity due to excess calories: Secondary | ICD-10-CM

## 2019-07-14 DIAGNOSIS — Z6839 Body mass index (BMI) 39.0-39.9, adult: Secondary | ICD-10-CM | POA: Diagnosis not present

## 2019-07-14 DIAGNOSIS — G809 Cerebral palsy, unspecified: Secondary | ICD-10-CM

## 2019-07-14 DIAGNOSIS — L732 Hidradenitis suppurativa: Secondary | ICD-10-CM

## 2019-07-14 MED ORDER — TRIAMCINOLONE ACETONIDE 55 MCG/ACT NA AERO: 2.0000 | INHALATION_SPRAY | Freq: Every day | NASAL | 12 refills | Status: DC

## 2019-07-14 NOTE — Progress Notes (Signed)
Name: Destiny Flores   MRN: [id number]    DOB: Jun 07, 1981   Date:07/14/2019       Progress Note  Subjective  Chief Complaint  Chief Complaint  Patient presents with  . Follow-up    3 month recheck    HPI  CP: She has cerebral palsey, some days her legs hurt more than the average person, LEFT side is weaker, legs get more tired as the day goes on.  Taking 5mg  valium PRN (also uses this for migraines).  She has been working at [employer] and [employer] (about 5-6 hours a few days a week) and this has been going well for her.  Seeing Neurology - Dr. Virgina Evener  Epilepsy: Last Lenis Noon was November 2013; Also notes a history of petit mal seizures/partial complex seizures - no recent seizure activity noted.  She She takes Felbtaol (brand name only); no longer on diamox. Also has vagus nerve stimulator placed 2002 (last battery changes was 2017).  She sees neurology - Dr. Virgina Evener.   Migraines: She is prescribed valium 5mg  PRN for her CP; migraine frequency has increased to about 6-8 a month. Has reglan Rx for nausea; takes Excedrin PRN OTC.  She had been taking melatonin, was starting to cut back, but her migraines increased, so her neurologist recommended she go back to taking 7.5-10mg  QHS.  Continuing to follow up with neurology for this.  Hidradenitis: She is using humira; also using some topical application (she is unsure of the name of the cream) PRN for any rash.  She has had recent abscess to the groin - is following with dermatology (Dr. Otho Ket) for this at this time.   GERD: She is taking omeprazole 20mg ; reflux occurs mostly at night. Spicy food is trigger.  Discussed risk of long-term PPI use, however she is having symptoms ongoing, so we will maintain at this time.   Denies abdominal pain, difficulty swallowing, blood in stool/dark and tarry stool.  Discussed dietary modifications in detail with her to help reduce symptoms.   Sinus congestion: She notes some tenderness to the right maxillary  sinus with some sore throat for the last few days.  No fevers/chills, loss of taste or smell, body aches, chest pain, shortness of breath.  She has been taking allegra.   Obesity: Body mass index is 39.48 kg/m. Weight Management History: Diet: Trying to eat more vegetables, less carbohydrates. Exercise: not active  Extensive education is provided.  Patient Active Problem List   Diagnosis Date Noted  . 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 Vitamin D deficiency 03/06/2018  . Cerebral palsy ([place]) 12/24/2017  . Epilepsy ([place]) 12/24/2017  . Hidradenitis suppurativa 12/24/2017  . 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 abscess surgery Bilateral 2006   armpits and groin.12 surgeries overall.   Marland Kitchen AXILLARY HIDRADENITIS EXCISION     MULTIPLE HYDRADENITIS SURGERIES IN [place]  . hidradenitis groin Left 04/2017   last surgery done on groin and the symptoms have returned  . HYDRADENITIS EXCISION N/A 01/16/2018   Procedure: EXCISION HIDRADENITIS GROIN;  Surgeon: Jules Husbands, MD;  Location: [facility] ORS;  Service: General;  Laterality: N/A;  . KNEE SURGERY Left 2010   tendon and meniscus repair  . NOSE SURGERY  1998   deviated septum and sinus repair  . ROBOTIC ASSISTED LAPAROSCOPIC CHOLECYSTECTOMY N/A 07/24/2018   Procedure: ROBOTIC ASSISTED LAPAROSCOPIC CHOLECYSTECTOMY;  Surgeon: Jules Husbands, MD;  Location: [facility]  ORS;  Service: General;  Laterality: N/A;  . TONSILLECTOMY    . TYMPANOSTOMY TUBE PLACEMENT     as a kid    Family History  Problem Relation Age of Onset  . Hyperlipidemia Father   . Epilepsy Brother   . ADD / ADHD Brother   . Breast cancer Paternal Grandmother 75       Passed away at 36yo  . Asthma Mother     Social History   Socioeconomic History  . Marital status: Single    Spouse name: Not on file  . Number of children: 0  . Years of education: 16  . Highest education level:  Bachelor's degree (e.g., BA, AB, BS)  Occupational History  . Occupation: unemployeed  Tobacco Use  . Smoking status: Never Smoker  . Smokeless tobacco: Never Used  Substance and Sexual Activity  . Alcohol use: Never  . Drug use: Never  . Sexual activity: Never    Partners: Male  Other Topics Concern  . Not on file  Social History Narrative  . Not on file   Social Determinants of Health   Financial Resource Strain:   . Difficulty of Paying Living Expenses: Not on file  Food Insecurity:   . Worried About Programme researcher, broadcasting/film/video in the Last Year: Not on file  . Ran Out of Food in the Last Year: Not on file  Transportation Needs:   . Lack of Transportation (Medical): Not on file  . Lack of Transportation (Non-Medical): Not on file  Physical Activity:   . Days of Exercise per Week: Not on file  . Minutes of Exercise per Session: Not on file  Stress:   . Feeling of Stress : Not on file  Social Connections: Unknown  . Frequency of Communication with Friends and Family: Not on file  . Frequency of Social Gatherings with Friends and Family: Never  . Attends Religious Services: Not on file  . Active Member of Clubs or Organizations: Not on file  . Attends Banker Meetings: Not on file  . Marital Status: Not on file  Intimate Partner Violence:   . Fear of Current or Ex-Partner: Not on file  . Emotionally Abused: Not on file  . Physically Abused: Not on file  . Sexually Abused: Not on file     Current Outpatient Medications:  .  acetaminophen (TYLENOL) 500 MG tablet, Take 1,000 mg by mouth every 6 (six) hours as needed for moderate pain or headache., Disp: , Rfl:  .  aspirin-acetaminophen-caffeine (EXCEDRIN MIGRAINE) 250-250-65 MG tablet, Take 2 tablets by mouth every 6 (six) hours as needed for headache or migraine., Disp: , Rfl:  .  Brivaracetam 50 MG TABS, Take 50 mg by mouth daily., Disp: , Rfl:  .  Cholecalciferol (D3-50) 1.25 MG (50000 UT) capsule, Take by  mouth., Disp: , Rfl:  .  clindamycin (CLINDAGEL) 1 % gel, Apply 1 application topically daily as needed (flare up)., Disp: , Rfl:  .  diazepam (VALIUM) 5 MG tablet, Take 5 mg by mouth at bedtime as needed (for migraine). , Disp: , Rfl:  .  ergocalciferol (VITAMIN D2) 1.25 MG (50000 UT) capsule, Take 50,000 Units by mouth once a week. Sundays, Disp: , Rfl:  .  felbamate (FELBATOL) 600 MG tablet, Take 600 mg by mouth 3 (three) times daily., Disp: , Rfl:  .  metoCLOPramide (REGLAN) 10 MG tablet, Take 1 tablet (10 mg total) by mouth daily as needed (for migraine)., Disp: 20 tablet,  Rfl: 0 .  naproxen sodium (ALEVE) 220 MG tablet, Take 440 mg by mouth at bedtime as needed (for pain or headache)., Disp: , Rfl:  .  nystatin cream (MYCOSTATIN), Apply 1 application topically 2 (two) times daily. (Patient taking differently: Apply 1 application topically 2 (two) times daily as needed for dry skin. ), Disp: 30 g, Rfl: 0 .  omeprazole (PRILOSEC) 20 MG capsule, Take 1 capsule (20 mg total) by mouth at bedtime., Disp: 90 capsule, Rfl: 1 .  sodium chloride (OCEAN) 0.65 % SOLN nasal spray, Place 1 spray into both nostrils as needed for congestion., Disp: , Rfl:  .  triamcinolone (KENALOG) 0.025 % cream, Apply 1 application topically 2 (two) times daily., Disp: 30 g, Rfl: 0 .  TURMERIC CURCUMIN PO, Take 2 capsules by mouth daily., Disp: , Rfl:  .  Turmeric, Curcuma Longa, (CURCUMIN) POWD, Take by mouth., Disp: , Rfl:  .  cephALEXin (KEFLEX) 500 MG capsule, Take 500 mg by mouth 2 (two) times a day., Disp: , Rfl:  .  HYDROcodone-acetaminophen (NORCO) 5-325 MG tablet, Take 1-2 tablets by mouth every 6 (six) hours as needed for moderate pain. (Patient not taking: Reported on 07/14/2019), Disp: 30 tablet, Rfl: 0  Allergies  Allergen Reactions  . Codeine Anaphylaxis  . Penicillins Anaphylaxis and Other (See Comments)    Has patient had a PCN reaction causing immediate rash, facial/tongue/throat swelling, SOB or  lightheadedness with hypotension: Yes Has patient had a PCN reaction causing severe rash involving mucus membranes or skin necrosis: No Has patient had a PCN reaction that required hospitalization: Yes Has patient had a PCN reaction occurring within the last 10 years: No If all of the above answers are "NO", then may proceed with Cephalosporin use.  Has patient had a PCN reaction causing immediate rash, facial/tongue/throat swelling, SOB or lightheadedness with hypotension: Yes Has patient had a PCN reaction causing severe rash involving mucus membranes or skin necrosis: No Has patient had a PCN reaction that required hospitalization: Yes Has patient had a PCN reaction occurring within the last 10 years: No If all of the above answers are "NO", then may proceed with Cephalosporin use. Has patient had a PCN reaction causing immediate rash, facial/tongue/throat swelling, SOB or lightheadedness with hypotension: Yes Has patient had a PCN reaction causing severe rash involving mucus membranes or skin necrosis: No Has patient had a PCN reaction that required hospitalization: Yes Has patient had a PCN reaction occurring within the last 10 years: No If all of the above answers are "NO", then may proceed with Cephalosporin use.  . Sulfa Antibiotics Hives  . Tape Other (See Comments)    Large Boils-PAPER TAPE OK TO USE Large Boils-PAPER TAPE OK TO USE    I personally reviewed active problem list, medication list, allergies, health maintenance, notes from last encounter, lab results with the patient/caregiver today.   ROS  Ten systems reviewed and is negative except as mentioned in HPI  Objective  Vitals:   07/14/19 0754  BP: 118/72  Pulse: 100  Resp: 16  Temp: 97.8 F (36.6 C)  TempSrc: Temporal  SpO2: 93%  Weight: 230 lb (104.3 kg)  Height: 5\' 4"  (1.626 m)    Body mass index is 39.48 kg/m.  Physical Exam Constitutional: Patient appears well-developed and well-nourished. No  distress.  HENT: Head: Normocephalic and atraumatic. No sinus tenderness bilaterally. OP Clear and moist. Eyes: Conjunctivae and EOM are normal. No scleral icterus.  Neck: Normal range of motion. Neck supple.  No JVD present.  Cardiovascular: Normal rate, regular rhythm and normal heart sounds.  No murmur heard. No BLE edema. Pulmonary/Chest: Effort normal and breath sounds normal. No respiratory distress. Abdominal: Soft. Bowel sounds are normal, no distension. There is no tenderness. No masses. Musculoskeletal: Normal range of motion, no joint effusions. No gross deformities Neurological: Pt is alert and oriented to person, place, and time. No cranial nerve deficit. Coordination, balance, strength, speech and gait are normal.  Skin: Skin is warm and dry. No rash noted. No erythema.  Psychiatric: Patient has a normal mood and affect. behavior is normal. Judgment and thought content normal.  No results found for this or any previous visit (from the past 72 hour(s)).   PHQ2/9: Depression screen [facility] 2/9 07/14/2019 04/28/2019 04/10/2019 01/20/2019 06/05/2018  Decreased Interest 0 0 0 0 0  Down, Depressed, Hopeless 0 0 0 0 0  PHQ - 2 Score 0 0 0 0 0  Altered sleeping 0 0 0 0 0  Tired, decreased energy 0 0 0 0 0  Change in appetite 0 0 0 0 0  Feeling bad or failure about yourself  0 0 0 0 0  Trouble concentrating 0 0 0 0 0  Moving slowly or fidgety/restless 0 0 0 0 0  Suicidal thoughts 0 0 0 0 0  PHQ-9 Score 0 0 0 0 0  Difficult doing work/chores Not difficult at all - Not difficult at all Not difficult at all Not difficult at all   PHQ-2/9 Result is negative.    Fall Risk: Fall Risk  07/14/2019 04/10/2019 01/20/2019 08/11/2018 08/11/2018  Falls in the past year? 0 0 0 0 0  Number falls in past yr: 0 0 0 - 0  Injury with Fall? 0 0 0 - 0  Follow up Falls evaluation completed Falls evaluation completed Falls evaluation completed - -   Assessment & Plan  1. Cerebral palsy, unspecified type  (HCC) 2. Nonintractable epilepsy without status epilepticus, unspecified epilepsy type [facility]) - Seeing neurology, stable at this time  3. Hidradenitis suppurativa - Seeing dermatology; working with them on a current flare in the groin area.  4. Class 2 obesity due to excess calories without serious comorbidity with body mass index (BMI) of 39.0 to 39.9 in adult - Discussed importance of 150 minutes of physical activity weekly, eat two servings of fish weekly, eat one serving of tree nuts ( cashews, pistachios, pecans, almonds.Marland Kitchen) every other day, eat 6 servings of fruit/vegetables daily and drink plenty of water and avoid sweet beverages.   5. Nasal congestion - Allegra, Mucinex OTC, humidifier, stay well hydrated.  Do not suspect sinusitis at this time. - triamcinolone (NASACORT) 55 MCG/ACT AERO nasal inhaler; Place 2 sprays into the nose daily.  Dispense: 1 Inhaler; Refill: 12  6. Gastroesophageal reflux disease without esophagitis - Extensive lifestyle management discussed.  Continue Omeprazole.

## 2019-07-14 NOTE — Patient Instructions (Addendum)
You may take mucinex twice daily for a few days. Continue taking allegra once daily.  Use a humidifier at night.   To help with reflux: - Elevate your head of bed - either with a few extra pillows, a wedge pillow, or by placing two bed risers under the head of bed posts. - Avoid the following foods: citrus, fatty foods, chocolate, peppermint, and excessive alcohol, along with sodas, orange juice (acidic drinks) - Stop eating at least 3 hours before going to bed, minimize naps/laying down after eating. - No smoking. - If you are overweight or obese, exercising and losing weight will also help your symptoms. - Caution: prolonged use of proton pump inhibitors like omeprazole (Prilosec), pantoprazole (Protonix), esomeprazole (Nexium), and others like Dexilant and Aciphex may increase your risk of pneumonia, Clostridium difficile colitis, osteoporosis, anemia and other health complications

## 2019-07-22 DIAGNOSIS — H5213 Myopia, bilateral: Secondary | ICD-10-CM | POA: Diagnosis not present

## 2019-07-23 DIAGNOSIS — H5213 Myopia, bilateral: Secondary | ICD-10-CM | POA: Diagnosis not present

## 2019-07-31 NOTE — Unmapped (Signed)
The [facility] has made a third and final attempt to reach this patient to refill the following medication:Humira.      We have left voicemails on the following phone numbers: [phone].    Dates contacted: 1/21 1/26 1/29  Last scheduled delivery: 12/30    The patient may be at risk of non-compliance with this medication. The patient should call the [facility] at [phone] (option 4) to refill medication.    Shamela Haydon D Administrator Shared [facility] Specialty Technician

## 2019-08-06 NOTE — Unmapped (Signed)
[facility] Refill Coordination Note    Specialty Medication(s) to be Shipped:   Inflammatory Disorders: Humira 40mg /0.61ml     Erica Norman, DOB: [birth year]  Phone: [phone] (home)     All above HIPAA information was verified with patient.     Was a Nurse, learning disability used for this call? No    Completed refill call assessment today to schedule patient's medication shipment from the [facility] [phone]).       Specialty medication(s) and dose(s) confirmed: Regimen is correct and unchanged.   Changes to medications: [place] reports no changes at this time.  Changes to insurance: No  Questions for the pharmacist: No    Confirmed patient received Welcome Packet with first shipment. The patient will receive a drug information handout for each medication shipped and additional FDA Medication Guides as required.       DISEASE/MEDICATION-SPECIFIC INFORMATION        For patients on injectable medications: Patient currently has 0 doses left.  Next injection is scheduled for 08/05/2019 (Patient left her dose in [place], while visiting family).    SPECIALTY MEDICATION ADHERENCE     Medication Adherence    Patient reported X missed doses in the last month: 0  Specialty Medication: Humira 40mg /0.30ml  Patient is on additional specialty medications: No  Informant: patient  Reliability of informant: reliable        SHIPPING     Shipping address confirmed in Epic.     Delivery Scheduled: Yes, Expected medication delivery date: 08/10/19.     Medication will be delivered via Same Day Courier to the prescription address in Epic WAM.    Rilda Bulls P Allena Katz   [facility] Shared [facility] Specialty Technician

## 2019-08-10 MED FILL — HUMIRA PEN CITRATE FREE 40 MG/0.4 ML: SUBCUTANEOUS | 28 days supply | Qty: 4 | Fill #2

## 2019-08-10 MED FILL — HUMIRA PEN CITRATE FREE 40 MG/0.4 ML: 28 days supply | Qty: 4 | Fill #2 | Status: AC

## 2019-08-25 DIAGNOSIS — L732 Hidradenitis suppurativa: Principal | ICD-10-CM

## 2019-08-25 MED ORDER — CLINDAMYCIN HCL 300 MG CAPSULE
ORAL_CAPSULE | Freq: Two times a day (BID) | ORAL | 2 refills | 32.00000 days | Status: CP
Start: 2019-08-25 — End: ?

## 2019-09-02 NOTE — Unmapped (Signed)
[facility] Refill Coordination Note    Specialty Medication(s) to be Shipped:   Inflammatory Disorders: Humira    Other medication(s) to be shipped: n/a     Erica Norman, DOB: [birth year]  Phone: [phone] (home)       All above HIPAA information was verified with patient.     Was a Nurse, learning disability used for this call? No    Completed refill call assessment today to schedule patient's medication shipment from the [facility] [phone]).       Specialty medication(s) and dose(s) confirmed: Regimen is correct and unchanged.   Changes to medications: [place] reports no changes at this time.  Changes to insurance: No  Questions for the pharmacist: No    Confirmed patient received Welcome Packet with first shipment. The patient will receive a drug information handout for each medication shipped and additional FDA Medication Guides as required.       DISEASE/MEDICATION-SPECIFIC INFORMATION        For patients on injectable medications: Patient currently has 0 doses left.  Next injection is scheduled for 09/07/2019.    SPECIALTY MEDICATION ADHERENCE     Medication Adherence    Patient reported X missed doses in the last month: 0  Specialty Medication: Humira CF 40 mg/0.4 ml   Patient is on additional specialty medications: No  Any gaps in refill history greater than 2 weeks in the last 3 months: no  Demonstrates understanding of importance of adherence: yes  Informant: patient  Reliability of informant: reliable  Confirmed plan for next specialty medication refill: delivery by pharmacy  Refills needed for supportive medications: not needed                Humira CF 40 mg/0.4 ml. 0 on hand      SHIPPING     Shipping address confirmed in Epic.     Delivery Scheduled: Yes, Expected medication delivery date: 09/04/2019.     Medication will be delivered via Same Day Courier to the prescription address in Epic WAM.    Aundreya Souffrant D Miking Usrey   [facility] Shared [facility] Specialty Technician

## 2019-09-04 ENCOUNTER — Other Ambulatory Visit: Payer: Self-pay | Admitting: Family Medicine

## 2019-09-04 DIAGNOSIS — K219 Gastro-esophageal reflux disease without esophagitis: Secondary | ICD-10-CM

## 2019-09-04 MED FILL — HUMIRA PEN CITRATE FREE 40 MG/0.4 ML: SUBCUTANEOUS | 28 days supply | Qty: 4 | Fill #3

## 2019-09-04 MED FILL — HUMIRA PEN CITRATE FREE 40 MG/0.4 ML: 28 days supply | Qty: 4 | Fill #3 | Status: AC

## 2019-09-22 DIAGNOSIS — Z23 Encounter for immunization: Secondary | ICD-10-CM | POA: Diagnosis not present

## 2019-09-24 NOTE — Unmapped (Signed)
[facility] Refill Coordination Note    Specialty Medication(s) to be Shipped:   Inflammatory Disorders: Humira    Other medication(s) to be shipped: none     Erica Norman, DOB: 06-27-1981  Phone: [phone] (home)       All above HIPAA information was verified with patient.     Was a Nurse, learning disability used for this call? No    Completed refill call assessment today to schedule patient's medication shipment from the [facility] [phone]).       Specialty medication(s) and dose(s) confirmed: Regimen is correct and unchanged.   Changes to medications: Erica Norman reports no changes at this time.  Changes to insurance: No  Questions for the pharmacist: No    Confirmed patient received Welcome Packet with first shipment. The patient will receive a drug information handout for each medication shipped and additional FDA Medication Guides as required.       DISEASE/MEDICATION-SPECIFIC INFORMATION        N/A    SPECIALTY MEDICATION ADHERENCE     Medication Adherence    Patient reported X missed doses in the last month: 0  Specialty Medication: Humira  Patient is on additional specialty medications: No          Humira: 1 dose on hand      SHIPPING     Shipping address confirmed in Epic.     Delivery Scheduled: Yes, Expected medication delivery date: 09/30/19.     Medication will be delivered via Same Day Courier to the prescription address in Epic WAM.    Erica Norman   [facility] Shared [facility] Specialty Technician

## 2019-09-30 MED FILL — HUMIRA PEN CITRATE FREE 40 MG/0.4 ML: 28 days supply | Qty: 4 | Fill #4 | Status: AC

## 2019-09-30 MED FILL — HUMIRA PEN CITRATE FREE 40 MG/0.4 ML: SUBCUTANEOUS | 28 days supply | Qty: 4 | Fill #4

## 2019-10-15 DIAGNOSIS — H5213 Myopia, bilateral: Secondary | ICD-10-CM | POA: Diagnosis not present

## 2019-10-20 DIAGNOSIS — Z23 Encounter for immunization: Secondary | ICD-10-CM | POA: Diagnosis not present

## 2019-11-02 DIAGNOSIS — R6250 Unspecified lack of expected normal physiological development in childhood: Secondary | ICD-10-CM | POA: Diagnosis not present

## 2019-11-02 DIAGNOSIS — L732 Hidradenitis suppurativa: Secondary | ICD-10-CM | POA: Diagnosis not present

## 2019-11-02 DIAGNOSIS — G40219 Localization-related (focal) (partial) symptomatic epilepsy and epileptic syndromes with complex partial seizures, intractable, without status epilepticus: Secondary | ICD-10-CM | POA: Diagnosis not present

## 2019-11-02 DIAGNOSIS — M62838 Other muscle spasm: Secondary | ICD-10-CM | POA: Diagnosis not present

## 2019-11-02 DIAGNOSIS — G809 Cerebral palsy, unspecified: Secondary | ICD-10-CM | POA: Diagnosis not present

## 2019-11-02 DIAGNOSIS — G43909 Migraine, unspecified, not intractable, without status migrainosus: Secondary | ICD-10-CM | POA: Diagnosis not present

## 2019-11-02 NOTE — Unmapped (Signed)
The [facility] has made a fourth and final attempt to reach this patient to refill the following medication:Humira.      We have left voicemails on the following phone numbers: [phone] and have sent a MyChart message.    Dates contacted: 4/22 4/27 5/3, mychart on 5/17.  Last scheduled delivery: 3/31    The patient may be at risk of non-compliance with this medication. The patient should call the [facility] at [phone] (option 4) to refill medication.    Dechelle Attaway D Administrator Shared [facility] Specialty Technician

## 2019-11-20 NOTE — Unmapped (Signed)
[facility] Refill Coordination Note    Specialty Medication(s) to be Shipped:   Inflammatory Disorders: Humira    Other medication(s) to be shipped: n/a     [name], DOB: 10/23/1980  Phone: [phone] (home)       All above HIPAA information was verified with patient.     Was a Nurse, learning disability used for this call? No    Completed refill call assessment today to schedule patient's medication shipment from the [facility] [phone]).       Specialty medication(s) and dose(s) confirmed: Regimen is correct and unchanged.   Changes to medications: [place] reports no changes at this time.  Changes to insurance: No  Questions for the pharmacist: No    Confirmed patient received Welcome Packet with first shipment. The patient will receive a drug information handout for each medication shipped and additional FDA Medication Guides as required.       DISEASE/MEDICATION-SPECIFIC INFORMATION        For patients on injectable medications: Patient currently has 0 doses left.  Next injection is scheduled for 5/24.    SPECIALTY MEDICATION ADHERENCE     Medication Adherence    Patient reported X missed doses in the last month: 1  Specialty Medication: humira 40mg /0.67ml  Patient is on additional specialty medications: No  Patient is on more than two specialty medications: No  Any gaps in refill history greater than 2 weeks in the last 3 months: no  Demonstrates understanding of importance of adherence: yes  Informant: patient                Humira 40mg /0.46ml: Patient has 0 days of medication on hand      SHIPPING     Shipping address confirmed in Epic.     Delivery Scheduled: Yes, Expected medication delivery date: 5/24.     Medication will be delivered via Same Day Courier to the prescription address in Epic WAM.    Olga Millers   [facility] Specialty Technician

## 2019-11-23 MED FILL — HUMIRA PEN CITRATE FREE 40 MG/0.4 ML: 28 days supply | Qty: 4 | Fill #5 | Status: AC

## 2019-11-23 MED FILL — HUMIRA PEN CITRATE FREE 40 MG/0.4 ML: SUBCUTANEOUS | 28 days supply | Qty: 4 | Fill #5

## 2019-12-01 ENCOUNTER — Encounter: Admit: 2019-12-01 | Discharge: 2019-12-02 | Payer: BLUE CROSS/BLUE SHIELD

## 2019-12-01 DIAGNOSIS — L732 Hidradenitis suppurativa: Secondary | ICD-10-CM | POA: Diagnosis not present

## 2019-12-01 MED ORDER — SPIRONOLACTONE 25 MG TABLET
ORAL_TABLET | Freq: Every day | ORAL | 3 refills | 7.00000 days | Status: CP
Start: 2019-12-01 — End: 2020-11-30

## 2019-12-01 NOTE — Unmapped (Signed)
Start Spironolactone 100 mg once a day.         Patient Education        spironolactone  Pronunciation:  spir ON oh LAK tone  Brand:  Aldactone, CaroSpir  What is the most important information I should know about spironolactone?  You should not use spironolactone if you Addison's disease, high levels of potassium in your blood, if you are unable to urinate, or if you are also taking eplerenone.  What is spironolactone?  Spironolactone is a potassium-sparing diuretic (water pill) that prevents your body from absorbing too much salt and keeps your potassium levels from getting too low.  Spironolactone is used to treat heart failure, high blood pressure (hypertension), or hypokalemia (low potassium levels in the blood).  Spironolactone also treats fluid retention (edema) in people with congestive heart failure, cirrhosis of the liver, or a kidney disorder called nephrotic syndrome.  Spironolactone is also used to diagnose or treat a condition in which you have too much aldosterone in your body. Aldosterone is a hormone produced by your adrenal glands to help regulate the salt and water balance in your body.  Spironolactone may also be used for purposes not listed in this medication guide.  What should I discuss with my healthcare provider before taking spironolactone?  You should not use spironolactone if you are allergic to it, or if you have:  ?? Addison's disease (an adrenal gland disorder);  ?? high levels of potassium in your blood (hyperkalemia);  ?? if you are unable to urinate; or  ?? if you are also taking eplerenone.  Tell your doctor if you have ever had:  ?? an electrolyte imbalance (such as low levels of calcium, magnesium, or sodium in your blood);  ?? kidney disease;  ?? liver disease; or  ?? heart disease.  Tell your doctor if you are pregnant or plan to become pregnant. Having congestive heart failure, cirrhosis, or uncontrolled high blood pressure during pregnancy may lead to medical problems in the mother or the baby. Your doctor should decide whether you take spironolactone if you are pregnant.  It may not be safe to breastfeed while using this medicine. Ask your doctor about any risk.  How should I take spironolactone?  Follow all directions on your prescription label and read all medication guides or instruction sheets. Your doctor may occasionally change your dose. Use the medicine exactly as directed.  Do not share this medicine with another person, even if they have the same symptoms you have.  You may take spironolactone with or without food, but take it the same way each time.  You will need frequent medical tests.  This medicine can affect the results of certain medical tests. Tell any doctor who treats you that you are using spironolactone.  If you need surgery, tell your surgeon you currently use this medicine. You may need to stop for a short time.  If you are being treated for high blood pressure, keep using this medication even if you feel well. High blood pressure often has no symptoms. You may need to use blood pressure medication for the rest of your life.  Store at room temperature away from heat, light, and moisture.  What happens if I miss a dose?  Take the medicine as soon as you can, but skip the missed dose if it is almost time for your next dose. Do not take two doses at one time.  What happens if I overdose?  Seek emergency medical attention or  call the Poison Help line at [phone].  What should I avoid while taking spironolactone?  Drinking alcohol can increase certain side effects.  Do not use potassium supplements or salt substitutes, unless your doctor has told you to.  Avoid a diet high in salt. Too much salt will cause your body to retain water and can make this medication less effective.  Avoid driving or hazardous activity until you know how this medicine will affect you. Your reactions could be impaired. Avoid getting up too fast from a sitting or lying position, or you may feel dizzy.  What are the possible side effects of spironolactone?  Get emergency medical help if you have signs of an allergic reaction: hives; difficulty breathing; swelling of your face, lips, tongue, or throat.  Call your doctor at once if you have:  ?? a light-headed feeling, like you might pass out;  ?? little or no urination;  ?? high potassium level --nausea, weakness, tingly feeling, chest pain, irregular heartbeats, loss of movement; o  ?? signs of other electrolyte imbalances --increased thirst or urination, confusion, vomiting, muscle pain, slurred speech, severe weakness, numbness, loss of coordination, feeling unsteady.  Common side effects may include:  ?? breast swelling or tenderness.  This is not a complete list of side effects and others may occur. Call your doctor for medical advice about side effects. You may report side effects to FDA at 1-800-FDA-1088.  What other drugs will affect spironolactone?  Using spironolactone with other drugs that make you dizzy can worsen this effect. Ask your doctor before using opioid medication, a sleeping pill, a muscle relaxer, or medicine for anxiety, depression, or seizures.  Tell your doctor about all your other medicines, especially:  ?? colchicine;  ?? digoxin;  ?? lithium;  ?? loperamide;  ?? trimethoprim;  ?? heart or blood pressure medicine (especially another diuretic);  ?? medicine to prevent a blood clot; or  ?? NSAIDs (nonsteroidal anti-inflammatory drugs) --aspirin, ibuprofen (Advil, Motrin), naproxen (Aleve), celecoxib, diclofenac, indomethacin, meloxicam, and others.  This list is not complete. Other drugs may affect spironolactone, including prescription and over-the-counter medicines, vitamins, and herbal products. Not all possible drug interactions are listed here.  Where can I get more information?  Your pharmacist can provide more information about spironolactone.  Remember, keep this and all other medicines out of the reach of children, never share your medicines with others, and use this medication only for the indication prescribed.   Every effort has been made to ensure that the information provided by [employer]') is accurate, up-to-date, and complete, but no guarantee is made to that effect. Drug information contained herein may be time sensitive. [employer] information has been compiled for use by healthcare practitioners and consumers in the [place] and therefore [employer] does not warrant that uses outside of the [place] are appropriate, unless specifically indicated otherwise. [employer]'s drug information does not endorse drugs, diagnose patients or recommend therapy. [employer]'s drug information is an [employer] corporate to assist licensed healthcare practitioners in caring for their patients and/or to serve consumers viewing this service as a supplement to, and not a substitute for, the expertise, skill, knowledge and judgment of healthcare practitioners. The absence of a warning for a given drug or drug combination in no way should be construed to indicate that the drug or drug combination is safe, effective or appropriate for any given patient. [employer] does not assume any responsibility for any aspect of healthcare administered with the aid of information  [employer] provides. The information contained herein is not intended to cover all possible uses, directions, precautions, warnings, drug interactions, allergic reactions, or adverse effects. If you have questions about the drugs you are taking, check with your doctor, nurse or pharmacist.  Copyright [phone] [employer] Version: 11.01. Revision date: 09/13/2018.  Care instructions adapted under license by [facility]. If you have questions about a medical condition or this instruction, always ask your healthcare professional. [employer] disclaims any warranty or liability for your use of this information.

## 2019-12-01 NOTE — Unmapped (Deleted)
ASSESSMENT/PLAN:  Hidradenitis Suppurativa   1. We discussed the typical natural history, pathogenesis, treatment options, and expected course as well as the relapsing and sometimes recalcitrant nature of the disease.    2. ***    RTC: ***     SUBJECTIVE:    CC: Hidradenitis Suppurativa    Erica Norman is a 39 y.o. female  who is seen for consultation today at the request of *** for evaluation of hidradenitis suppurativa.    Self-reported severity (0-5): ***  VAS pain today: ***  VAS average pain for the last month: ***  Requiring pain medication? ***  If so, what type/frequency? ***  How often in pain?  {hspain:52677}  Level of odor (0-5): ***  Level of itching (0-5): ***  Dressing changes needed for drainage:{hs dressings:52675}  How much drainage: {hsdrainage:52676}  Flare in the last month (Y/N)? ***  How long ago was the last flare? ***  Developing new lesions? {hsnewlesion:52677}  Number of inflammatory lesions montly: ***  DLQI: ***  Current treatment: ***     How helpful is the current treatment in managing the following aspects of your disease?  Not at all helpful Somewhat helpful Very helpful   Pain      Decreasing length of flares      Decreasing new lesions      Drainage      Decreasing frequency of flares      Decreasing severity of flares      Odor        Disease course:  Year when symptoms first noticed: ***  Year of diagnosis: ***  Who diagnosed you? ***  Location of first symptoms: ***  Typical involved areas include: ***  Typical number of inflammatory lesions each month at baseline (from first visit): ***  Disease triggers: ***    Are menstrual cycles irregular when not on birth control? ***  Current form of contraception: ***  Effect of hormonal contraception on disease: ***  Flaring with menstrual cycle (before, during, or after?): ***  Difficulty becoming pregnancy?***  Pregnancy complications? ***  How many children? ***  Better or worse with pregnancy? ***.  If so, worse during a specific trimester? ***  Worse following pregnancy? ***    Social History:  Current or former smoker? ***  Amount smoking: ***  Difficulty affording medications? ***  ED visits in the last 5 years? ***  Marital Status: ***  Living with some one? ***    Prior treatments:  Topical: ***  Systemic: ***  Past surgical procedures: ***  Past laser procedures: ***      FH:      Patient Mother Father Son Daughter Brother Sister Maternal Grandmother Maternal Grandfather Paternal Grandmother Paternal Grandfather Maternal Aunt Maternal Uncle Paternal Aunt Paternal Uncle Other:   Derm Hidradenitis Suppurativa                                      Pilonidal sinus                                     Acne                                      Dissecting cellulitis(Scalp)  Eczema                                     Allergies                    Rheum Joint pains                                      SAPHO                                     Pyoderma gangrenosum                                     Back pain                                    Auto-immune disease                                     Asthma                    Endo Polycystic ovarian syndrome                                     Thyroid disease                    Vitamin D deficiency                   Psych Anxiety                                     Depression                                     Dementia                                     Suicidal thoughts*                                   Cardio Hypertension                                     High cholesterol                                     Heart attack  Stroke                                     Hem-onc Cancer  ______________                                    Anemia                    GI Inflammatory Bowel Disease (Ulcerative colitis or Crohn???s disease)                                   ID HIV                     Syphilis                     Other ROS: the balance of 10 systems is negative unless otherwise documented      OBJECTIVE:   Gen: Well-appearing patient, appropriate, interactive, in no acute distress  Skin: Examination of the scalp, face, neck, chest, back, abdomen, bilateral upper and lower extremities, hands, palms, soles, nails, buttocks, and external genitalia performed today and pertinent for:     location Abscess Inflamed nodule Non-inflamed nodule Draining sinus Non-draining Sinus Hurley % scar   R axilla          L axilla          R inframammary          L inframammary          Intermammary          Pubic          R inguinal          R thigh          L inguinal          L thigh          Scrotum/Vulva          Perianal          R buttock          L buttock          Other (list)                      AN count (total sum of abscess and inflammatory nodule): ***  Pilonidal sinus (Y/N, or previously treated)? ***  Approximate BSA involved by inflammatory lesions: ***

## 2019-12-01 NOTE — Unmapped (Addendum)
ASSESSMENT/PLAN:  Hidradenitis Suppurativa (HS): Hurley stage I (abscess formation [single or multiple] without sinus tracts or cicatrization), Hurley stage II (recurrent abscesses with tract formation and cicatrization [single or multiple widely separated lesions]), or stage III (diffuse or near-diffuse involvement or multiple interconnected tracts and abscesses across the entire area)  - discussed the chronic, relapsing nature of this skin disease, characterized by recurring inflamed painful nodules with abscess and sinus formation, and scarring  - explained to patient that usually early lesion can remit with medical treatment, but once sinus tracts are established treatment options are limited and surgery is the only definitive treatment; however, recurrence rate can be up to 25% even with surgery  - Continue Humira 40 mg weekly   - Start Spironolactone 100 mg daily.   - On reserve:  Infliximab,  increasing to Humira 80 mg weekly   Intralesional Kenalog Injection Procedure Note:   After discussion of risks/benefits and obtaining verbal consent, ILK 40 mg/cc 0.15 cc was injected total into right and left buttock after cleaning with alcohol swab. Patient tolerated procedure well, post injection instructions given.      RTC: 3 months      SUBJECTIVE:    CC: Hidradenitis Suppurativa    Erica Norman is a 39 y.o. female  who is seen as a retuning patient for Hidradenitis Suppurativa.     She flares before and during her menstrual cycle. She is most active in the groin on the sides. She tried spironolactone before briefly, but inconsistently because it caused hypotension in combination with her diamox. She is not longer taking diamox. She is not planing to get pregnant. She takes her humira on Monday/tuesday.     Self-reported severity (0-5): 4  VAS pain today: 7  VAS average pain for the last month: 3  Requiring pain medication? Yes.  If so, what type/frequency? Advil, tylenol  How often in pain?  few times a month Level of odor (0-5): 3  Level of itching (0-5): 2  Dressing changes needed for drainage:Once every 2-3 days  How much drainage: some drainage  Flare in the last month (Y/N)? No.  How long ago was the last flare? in last 6 months  Developing new lesions? less than monthly  Number of inflammatory lesions montly: 1-3  DLQI: 15  Current treatment: Humira 40 mg weekly      How helpful is the current treatment in managing the following aspects of your disease?  Not at all helpful Somewhat helpful Very helpful   Pain  x    Decreasing length of flares x     Decreasing new lesions   x   Drainage  x    Decreasing frequency of flares   x   Decreasing severity of flares  x    Odor  x          ROS: the balance of 10 systems is negative unless otherwise documented      OBJECTIVE:   Gen: Well-appearing patient, appropriate, interactive, in no acute distress  Skin: Examination of the scalp, face, neck, chest, back, abdomen, bilateral upper and lower extremities, hands, palms, soles, nails, buttocks, and external genitalia performed today and pertinent for:     location Abscess Inflamed nodule Non-inflamed nodule Draining sinus Non-draining Sinus Hurley % scar   R axilla          L axilla          R inframammary          L  inframammary          Intermammary          Pubic          R inguinal  1        R thigh          L inguinal  1        L thigh          Scrotum/Vulva          Perianal          R buttock  1        L buttock  1        Other (list)

## 2019-12-02 NOTE — Unmapped (Signed)
I saw and evaluated the patient, participating in the key elements of the service.  I discussed the findings, assessment and plan with the resident and agree with resident’s findings and plan as documented in the resident's note.  I was immediately available for the entirety of the procedure(s) and present for the key and critical portions. Dom Haverland J Krystelle Prashad, MD

## 2019-12-04 DIAGNOSIS — M25662 Stiffness of left knee, not elsewhere classified: Secondary | ICD-10-CM | POA: Diagnosis not present

## 2019-12-04 DIAGNOSIS — R262 Difficulty in walking, not elsewhere classified: Secondary | ICD-10-CM | POA: Diagnosis not present

## 2019-12-09 ENCOUNTER — Ambulatory Visit
Admission: EM | Admit: 2019-12-09 | Discharge: 2019-12-09 | Disposition: A | Discharge status: Home / Self Care | Payer: Medicaid Other | Attending: Emergency Medicine | Admitting: Emergency Medicine

## 2019-12-09 DIAGNOSIS — L03314 Cellulitis of groin: Secondary | ICD-10-CM | POA: Diagnosis not present

## 2019-12-09 DIAGNOSIS — L732 Hidradenitis suppurativa: Secondary | ICD-10-CM

## 2019-12-09 MED ORDER — CEPHALEXIN 500 MG PO CAPS: 500.0000 mg | ORAL_CAPSULE | Freq: Three times a day (TID) | ORAL | 0 refills | Status: AC

## 2019-12-09 NOTE — ED Provider Notes (Signed)
Roderic Palau    CSN: [id number] Arrival date & time: 12/09/19  1300      History   Chief Complaint Chief Complaint  Patient presents with  . Rash    HPI Destiny Flores is a 39 y.o. female.  Patient presents with painful, red rash on right groin x 2 days and is concerned for cellulitis which she reports she has frequently.  She is followed by [facility] for Hidradenitis suppurativa; last seen on 12/01/2019; was given Kenalog injection and started on spironolactone; is also on Humira.  She denies fever, chills, abdominal pain, dysuria, vaginal discharge, pelvic pain, or other symptoms.   Patient has an allergy to PCN but states she has taken Keflex without any problems.    The history is provided by the patient.    Past Medical History:  Diagnosis Date  . Cerebral palsy ([place]) 12/24/2017  . Complication of anesthesia    SEVERE MIGRAINES AFTER ANESTHESIA  . Epilepsy ([place]) 12/24/2017   stress, heat and random things bring on seizures  . 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 GERD (gastroesophageal reflux disease)   . Headache   . Hidradenitis suppurativa 12/24/2017  . History of kidney stones    H/O  . History of methicillin resistant staphylococcus aureus (MRSA) 2008   positive from axilla, left  . Hx of Kawasaki's disease   . Hydradenitis   . Seizures ([place])    LAST SEIZURE 2012  . 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 Class 2 obesity due to excess calories without serious comorbidity with body mass index (BMI) of 39.0 to 39.9 in adult 07/14/2019  . 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 Vitamin D deficiency 03/06/2018  . Cerebral palsy ([place]) 12/24/2017  . Epilepsy ([place]) 12/24/2017  . Hidradenitis suppurativa 12/24/2017  . Status post  VNS (vagus nerve stimulator) placement 12/24/2017    Past Surgical History:  Procedure Laterality Date  . abscess surgery Bilateral 2006   armpits and groin.12 surgeries overall.   Marland Kitchen AXILLARY HIDRADENITIS EXCISION     MULTIPLE HYDRADENITIS SURGERIES IN [place]  . hidradenitis groin Left 04/2017   last surgery done on groin and the symptoms have returned  . HYDRADENITIS EXCISION N/A 01/16/2018   Procedure: EXCISION HIDRADENITIS GROIN;  Surgeon: Jules Husbands, MD;  Location: [facility] ORS;  Service: General;  Laterality: N/A;  . KNEE SURGERY Left 2010   tendon and meniscus repair  . NOSE SURGERY  1998   deviated septum and sinus repair  . ROBOTIC ASSISTED LAPAROSCOPIC CHOLECYSTECTOMY N/A 07/24/2018   Procedure: ROBOTIC ASSISTED LAPAROSCOPIC CHOLECYSTECTOMY;  Surgeon: Jules Husbands, MD;  Location: [facility] ORS;  Service: General;  Laterality: N/A;  . TONSILLECTOMY    . TYMPANOSTOMY TUBE PLACEMENT     as a kid    OB History   No obstetric history on file.      Home Medications    Prior to Admission medications   Medication Sig Start Date End Date Taking? Authorizing Provider  acetaminophen (TYLENOL) 500 MG tablet Take 1,000 mg by mouth every 6 (six) hours as needed for moderate pain or headache.    [provider]  aspirin-acetaminophen-caffeine (EXCEDRIN MIGRAINE) [phone] MG tablet Take 2 tablets by mouth every 6 (six) hours as needed for headache or migraine.  [provider]  Brivaracetam 50 MG TABS Take 50 mg by mouth daily. 07/09/18   [provider]  cephALEXin (KEFLEX) 500 MG capsule Take 1 capsule (500 mg total) by mouth 3 (three) times daily for 7 days. 12/09/19 12/16/19  Mickie Bail, NP  Cholecalciferol (D3-50) 1.25 MG (50000 UT) capsule Take by mouth.    [provider]  clindamycin (CLINDAGEL) 1 % gel Apply 1 application topically daily as needed (flare up).    [provider]  diazepam (VALIUM) 5 MG tablet Take 5 mg by mouth  at bedtime as needed (for migraine).     [provider]  ergocalciferol (VITAMIN D2) 1.25 MG (50000 UT) capsule Take 50,000 Units by mouth once a week. Sundays    [provider]  felbamate (FELBATOL) 600 MG tablet Take 600 mg by mouth 3 (three) times daily.    [provider]  metoCLOPramide (REGLAN) 10 MG tablet Take 1 tablet (10 mg total) by mouth daily as needed (for migraine). 06/05/18   Doren Custard, FNP  naproxen sodium (ALEVE) 220 MG tablet Take 440 mg by mouth at bedtime as needed (for pain or headache).    [provider]  nystatin cream (MYCOSTATIN) Apply 1 application topically 2 (two) times daily. Patient taking differently: Apply 1 application topically 2 (two) times daily as needed for dry skin.  05/06/18   Pia Mau M, PA-C  omeprazole (PRILOSEC) 20 MG capsule TAKE 1 CAPSULE (20 MG TOTAL) BY MOUTH AT BEDTIME. 09/04/19   Doren Custard, FNP  sodium chloride (OCEAN) 0.65 % SOLN nasal spray Place 1 spray into both nostrils as needed for congestion.    [provider]  triamcinolone (NASACORT) 55 MCG/ACT AERO nasal inhaler Place 2 sprays into the nose daily. 07/14/19   Doren Custard, FNP  TURMERIC CURCUMIN PO Take 2 capsules by mouth daily.    [provider]  Turmeric, Corwin Levins, (CURCUMIN) POWD Take by mouth.    [provider]    Family History Family History  Problem Relation Age of Onset  . Hyperlipidemia Father   . Epilepsy Brother   . ADD / ADHD Brother   . Breast cancer Paternal Grandmother 42       Passed away at 36yo  . Asthma Mother     Social History Social History   Tobacco Use  . Smoking status: Never Smoker  . Smokeless tobacco: Never Used  Substance Use Topics  . Alcohol use: Never  . Drug use: Never     Allergies   Codeine, Penicillins, Sulfa antibiotics, and Tape   Review of Systems Review of Systems  Constitutional: Negative for chills and fever.  HENT: Negative for ear  pain and sore throat.   Eyes: Negative for pain and visual disturbance.  Respiratory: Negative for cough and shortness of breath.   Cardiovascular: Negative for chest pain and palpitations.  Gastrointestinal: Negative for abdominal pain and vomiting.  Genitourinary: Negative for dysuria and hematuria.  Musculoskeletal: Negative for arthralgias and back pain.  Skin: Positive for rash. Negative for wound.  Neurological: Negative for seizures and syncope.  All other systems reviewed and are negative.    Physical Exam Triage Vital Signs ED Triage Vitals [12/09/19 1301]  Enc Vitals Group     BP      Pulse      Resp      Temp      Temp src      SpO2  Weight      Height      Head Circumference      Peak Flow      Pain Score 4     Pain Loc      Pain Edu?      Excl. in GC?    No data found.  Updated Vital Signs BP 107/62 (BP Location: Left Arm)   Pulse 85   Temp 97.9 F (36.6 C) (Oral)   Resp 12   LMP 11/18/2019 (Within Days)   SpO2 97%   Visual Acuity Right Eye Distance:   Left Eye Distance:   Bilateral Distance:    Right Eye Near:   Left Eye Near:    Bilateral Near:     Physical Exam Vitals and nursing note reviewed.  Constitutional:      General: She is not in acute distress.    Appearance: She is well-developed. She is not ill-appearing.  HENT:     Head: Normocephalic and atraumatic.     Mouth/Throat:     Mouth: Mucous membranes are moist.  Eyes:     Conjunctiva/sclera: Conjunctivae normal.  Cardiovascular:     Rate and Rhythm: Normal rate and regular rhythm.     Heart sounds: No murmur.  Pulmonary:     Effort: Pulmonary effort is normal. No respiratory distress.     Breath sounds: Normal breath sounds.  Abdominal:     Palpations: Abdomen is soft.     Tenderness: There is no abdominal tenderness. There is no guarding or rebound.  Musculoskeletal:     Cervical back: Neck supple.  Skin:    General: Skin is warm and dry.     Findings: Rash  present.     Comments: 4 cm x 4 cm area of tenderness and erythema.  No wounds or drainage.   Neurological:     Mental Status: She is alert.  Psychiatric:        Mood and Affect: Mood normal.        Behavior: Behavior normal.      UC Treatments / Results  Labs (all labs ordered are listed, but only abnormal results are displayed) Labs Reviewed - No data to display  EKG   Radiology No results found.  Procedures Procedures (including critical care time)  Medications Ordered in UC Medications - No data to display  Initial Impression / Assessment and Plan / UC Course  I have reviewed the triage vital signs and the nursing notes.  Pertinent labs & imaging results that were available during my care of the patient were reviewed by me and considered in my medical decision making (see chart for details).   Right groin cellulitis; Hidradenitis suppurativa.  Treating with Keflex.  Instructed patient to follow up with her PCP or dermatologist if her symptoms are not improving.  Patient agrees to plan of care.     Final Clinical Impressions(s) / UC Diagnoses   Final diagnoses:  Cellulitis of right groin  Hidradenitis suppurativa     Discharge Instructions     Take the Keflex as directed.    Follow up with your primary care provider if your symptoms are not improving.       ED Prescriptions    Medication Sig Dispense Auth. Provider   cephALEXin (KEFLEX) 500 MG capsule Take 1 capsule (500 mg total) by mouth 3 (three) times daily for 7 days. 21 capsule Mickie Bail, NP     PDMP not reviewed this encounter.   Arlana Pouch,  Fredrich Romans, NP 12/09/19 1330

## 2019-12-09 NOTE — Discharge Instructions (Signed)
Take the Keflex as directed.    Follow up with your primary care provider if your symptoms are not improving.    

## 2019-12-09 NOTE — ED Triage Notes (Signed)
Patient reports hx hydradenitis. Reports two days ago she noticed pain in her right groin/perineium area. Hurts worse when moving. Believes it to be cellulitis.

## 2019-12-10 DIAGNOSIS — L732 Hidradenitis suppurativa: Principal | ICD-10-CM

## 2019-12-10 MED ORDER — SPIRONOLACTONE 100 MG TABLET
ORAL_TABLET | Freq: Every day | ORAL | 3 refills | 30.00000 days | Status: CP
Start: 2019-12-10 — End: 2020-12-09

## 2019-12-10 MED ORDER — SPIRONOLACTONE 25 MG TABLET
ORAL_TABLET | Freq: Every day | ORAL | 3 refills | 7.00000 days | Status: CP
Start: 2019-12-10 — End: 2019-12-10

## 2019-12-10 NOTE — Unmapped (Signed)
I notified the patient through MyChart that the Humira level does not suggest there are Antibodies interfering with efficacy. If she is still not at treatment goal at her next visit after starting Spironolactone, we will discuss starting a different systemic medication.     Shelbie Ammons, MD MBA

## 2019-12-21 NOTE — Unmapped (Signed)
Erica Norman reports the addition of spironolactone has been helpful for her HS - she has noticed some clearing of existing HS areas.     [facility] Clinical Assessment & Refill Coordination Note    Erica Norman, DOB: January 20, 1981  Phone: [phone] (home)     All above HIPAA information was verified with patient.     Was a Nurse, learning disability used for this call? No    Specialty Medication(s):   Inflammatory Disorders: Humira     Current Outpatient Medications   Medication Sig Dispense Refill   ??? ADALIMUMAB PEN CITRATE FREE 40 MG/0.4 ML Inject the contents of 1 pen (40 mg) under the skin once weekly 4 each 11   ??? ascorbic acid, vitamin C, (VITAMIN C) 1000 MG tablet Take 1,000 mg by mouth.     ??? brivaracetam (BRIVIACT) 50 mg tablet Take 50 mg by mouth.     ??? cholecalciferol, vitamin D3, 1,250 mcg (50,000 unit) capsule Take 50,000 Units by mouth.     ??? diazePAM (VALIUM) 5 MG tablet Take 5 mg by mouth.     ??? diazePAM (VALIUM) 5 MG tablet Take 5 mg by mouth every hour as needed.     ??? FELBATOL 600 mg tablet      ??? magnesium oxide (MAG-OX) 400 mg (241.3 mg magnesium) tablet Take 400 mg by mouth daily.     ??? melatonin 5 mg tablet Take 7.5 mg by mouth.     ??? omeprazole (PRILOSEC) 20 MG capsule Take 20 mg by mouth.     ??? omeprazole (PRILOSEC) 20 MG capsule Take 20 mg by mouth.     ??? pyridoxine, vitamin B6, (B-6) 100 MG tablet Take 100 mg by mouth.     ??? sodium chloride (AYR) 0.65 % Drop 1 spray into each nostril.     ??? spironolactone (ALDACTONE) 100 MG tablet Take 1 tablet (100 mg total) by mouth daily. 30 tablet 3   ??? turmeric, bulk, 95 % Powd Take 2 capsules by mouth.       No current facility-administered medications for this visit.        Changes to medications: Erica Norman reports no changes at this time.    Allergies   Allergen Reactions   ??? Codeine Anaphylaxis   ??? Penicillins Anaphylaxis and Other (See Comments)     Has patient had a PCN reaction causing immediate rash, facial/tongue/throat swelling, SOB or lightheadedness with hypotension: Yes  Has patient had a PCN reaction causing severe rash involving mucus membranes or skin necrosis: No  Has patient had a PCN reaction that required hospitalization: Yes  Has patient had a PCN reaction occurring within the last 10 years: No  If all of the above answers are NO, then may proceed with Cephalosporin use.  Has patient had a PCN reaction causing immediate rash, facial/tongue/throat swelling, SOB or lightheadedness with hypotension: Yes  Has patient had a PCN reaction causing severe rash involving mucus membranes or skin necrosis: No  Has patient had a PCN reaction that required hospitalization: Yes  Has patient had a PCN reaction occurring within the last 10 years: No  If all of the above answers are NO, then may proceed with Cephalosporin use.     ??? Sulfa (Sulfonamide Antibiotics) Hives   ??? Adhesive Tape-Silicones Other (See Comments)     Large Boils-PAPER TAPE OK TO USE       Changes to allergies: No    SPECIALTY MEDICATION ADHERENCE  Humira - 1 left  Medication Adherence    Patient reported X missed doses in the last month: 0  Specialty Medication: Humira  Patient is on additional specialty medications: No          Specialty medication(s) dose(s) confirmed: Regimen is correct and unchanged.     Are there any concerns with adherence? No    Adherence counseling provided? Not needed    CLINICAL MANAGEMENT AND INTERVENTION      Clinical Benefit Assessment:    Do you feel the medicine is effective or helping your condition? Yes    Clinical Benefit counseling provided? Not needed    Adverse Effects Assessment:    Are you experiencing any side effects? No    Are you experiencing difficulty administering your medicine? No    Quality of Life Assessment:    How many days over the past month did your HS  keep you from your normal activities? For example, brushing your teeth or getting up in the morning. 0    Have you discussed this with your provider? Not needed Therapy Appropriateness:    Is therapy appropriate? Yes, therapy is appropriate and should be continued    DISEASE/MEDICATION-SPECIFIC INFORMATION      For patients on injectable medications: Patient currently has 1 doses left.  Next injection is scheduled for today, 6/21.    PATIENT SPECIFIC NEEDS     - Does the patient have any physical, cognitive, or cultural barriers? No    - Is the patient high risk? No     - Does the patient require a Care Management Plan? No     - Does the patient require physician intervention or other additional services (i.e. nutrition, smoking cessation, social work)? No      SHIPPING     Specialty Medication(s) to be Shipped:   Inflammatory Disorders: Humira    Other medication(s) to be shipped: na     Changes to insurance: No    Delivery Scheduled: Yes, Expected medication delivery date: 6/24.     Medication will be delivered via Same Day Courier to the confirmed prescription address in [facility].    The patient will receive a drug information handout for each medication shipped and additional FDA Medication Guides as required.  Verified that patient has previously received a Conservation officer, historic buildings.    All of the patient's questions and concerns have been addressed.    Erica Norman   [facility] Specialty Pharmacist

## 2019-12-24 MED FILL — HUMIRA PEN CITRATE FREE 40 MG/0.4 ML: 28 days supply | Qty: 4 | Fill #6 | Status: AC

## 2019-12-24 MED FILL — HUMIRA PEN CITRATE FREE 40 MG/0.4 ML: SUBCUTANEOUS | 28 days supply | Qty: 4 | Fill #6

## 2019-12-29 DIAGNOSIS — R262 Difficulty in walking, not elsewhere classified: Secondary | ICD-10-CM | POA: Diagnosis not present

## 2019-12-29 DIAGNOSIS — M25662 Stiffness of left knee, not elsewhere classified: Secondary | ICD-10-CM | POA: Diagnosis not present

## 2020-01-05 DIAGNOSIS — R262 Difficulty in walking, not elsewhere classified: Secondary | ICD-10-CM | POA: Diagnosis not present

## 2020-01-05 DIAGNOSIS — M25662 Stiffness of left knee, not elsewhere classified: Secondary | ICD-10-CM | POA: Diagnosis not present

## 2020-01-12 ENCOUNTER — Ambulatory Visit
Admission: RE | Admit: 2020-01-12 | Discharge: 2020-01-12 | Disposition: A | Discharge status: Home / Self Care | Payer: Medicaid Other | Source: Ambulatory Visit | Attending: Family Medicine | Admitting: Family Medicine

## 2020-01-12 ENCOUNTER — Other Ambulatory Visit: Payer: Self-pay

## 2020-01-12 VITALS — BP 114/81 | HR 99 | Temp 99.0°F | Resp 14

## 2020-01-12 DIAGNOSIS — J0110 Acute frontal sinusitis, unspecified: Secondary | ICD-10-CM | POA: Diagnosis not present

## 2020-01-12 DIAGNOSIS — R05 Cough: Secondary | ICD-10-CM

## 2020-01-12 DIAGNOSIS — R0981 Nasal congestion: Secondary | ICD-10-CM

## 2020-01-12 DIAGNOSIS — R509 Fever, unspecified: Secondary | ICD-10-CM | POA: Diagnosis not present

## 2020-01-12 DIAGNOSIS — R059 Cough, unspecified: Secondary | ICD-10-CM

## 2020-01-12 MED ORDER — DOXYCYCLINE HYCLATE 100 MG PO CAPS: 100.0000 mg | ORAL_CAPSULE | Freq: Two times a day (BID) | ORAL | 0 refills | Status: DC

## 2020-01-12 NOTE — Discharge Instructions (Addendum)
You have a sinus infection.  I have   Follow up with this office or with primary care if you are not feeling better over the next 2 days.  Follow up with the ER for trouble swallowing, trouble breathing, other concerning symptoms.

## 2020-01-12 NOTE — ED Provider Notes (Signed)
[facility]   [id number] 01/12/20 Arrival Time: 1510   CC: COVID symptoms  SUBJECTIVE: History from: patient.  Destiny Flores is a 39 y.o. female who presents with abrupt onset of nasal congestion, sinus pain and pressure PND, fever and persistent dry cough for last 4 days.  Reports that she has had a cold for the last week, and the fever has been present for the last 4 days.  Denies sick exposure to COVID, flu or strep. Denies recent travel. Has taking Allegra and Tylenol with little relief. There are no aggravating symptoms. Reports previous symptoms in the past. Denies chills, sinus pain, rhinorrhea, sore throat, SOB, wheezing, chest pain, nausea, changes in bowel or bladder habits.    ROS: As per HPI.  All other pertinent ROS negative.     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    AXILLARY HIDRADENITIS EXCISION     MULTIPLE HYDRADENITIS SURGERIES IN [place]   hidradenitis groin Left 04/2017   last surgery done on groin and the symptoms have returned   HYDRADENITIS EXCISION N/A 01/16/2018    Procedure: EXCISION HIDRADENITIS GROIN;  Surgeon: Leafy Ro, MD;  Location: [facility] ORS;  Service: General;  Laterality: N/A;   KNEE SURGERY Left 2010   tendon and meniscus repair   NOSE SURGERY  1998   deviated septum and sinus repair   ROBOTIC ASSISTED LAPAROSCOPIC CHOLECYSTECTOMY N/A 07/24/2018   Procedure: ROBOTIC ASSISTED LAPAROSCOPIC CHOLECYSTECTOMY;  Surgeon: Leafy Ro, MD;  Location: [facility] ORS;  Service: General;  Laterality: N/A;   TONSILLECTOMY     TYMPANOSTOMY TUBE PLACEMENT     as a kid   Allergies  Allergen Reactions   Codeine Anaphylaxis   Penicillins Anaphylaxis and Other (See Comments)    Has patient had a PCN reaction causing immediate rash, facial/tongue/throat swelling, SOB or lightheadedness with hypotension: Yes Has patient had a PCN reaction causing severe rash involving mucus membranes or skin necrosis: No Has patient had a PCN reaction that required hospitalization: Yes Has patient had a PCN reaction occurring within the last 10 years: No If all of the above answers are "NO", then may proceed with Cephalosporin use.  Has patient had a PCN reaction causing immediate rash, facial/tongue/throat swelling, SOB or lightheadedness with hypotension: Yes Has patient had a PCN reaction causing severe rash involving mucus membranes or skin necrosis: No Has patient had a PCN reaction that required hospitalization: Yes Has patient had a PCN reaction occurring within the last 10 years: No If all of the above answers are "NO", then may proceed with Cephalosporin use. Has  patient had a PCN reaction causing immediate rash, facial/tongue/throat swelling, SOB or lightheadedness with hypotension: Yes Has patient had a PCN reaction causing severe rash involving mucus membranes or skin necrosis: No Has patient had a PCN reaction that required hospitalization: Yes Has patient had a PCN reaction occurring within the last 10 years: No If all of the above answers are "NO",  then may proceed with Cephalosporin use.   Sulfa Antibiotics Hives   Tape Other (See Comments)    Large Boils-PAPER TAPE OK TO USE Large Boils-PAPER TAPE OK TO USE   No current facility-administered medications on file prior to encounter.   Current Outpatient Medications on File Prior to Encounter  Medication Sig Dispense Refill   acetaminophen (TYLENOL) 500 MG tablet Take 1,000 mg by mouth every 6 (six) hours as needed for moderate pain or headache.     aspirin-acetaminophen-caffeine (EXCEDRIN MIGRAINE) 250-250-65 MG tablet Take 2 tablets by mouth every 6 (six) hours as needed for headache or migraine.     Brivaracetam 50 MG TABS Take 50 mg by mouth daily.     Cholecalciferol (D3-50) 1.25 MG (50000 UT) capsule Take by mouth.     clindamycin (CLINDAGEL) 1 % gel Apply 1 application topically daily as needed (flare up).     diazepam (VALIUM) 5 MG tablet Take 5 mg by mouth at bedtime as needed (for migraine).      ergocalciferol (VITAMIN D2) 1.25 MG (50000 UT) capsule Take 50,000 Units by mouth once a week. Sundays     felbamate (FELBATOL) 600 MG tablet Take 600 mg by mouth 3 (three) times daily.     metoCLOPramide (REGLAN) 10 MG tablet Take 1 tablet (10 mg total) by mouth daily as needed (for migraine). 20 tablet 0   naproxen sodium (ALEVE) 220 MG tablet Take 440 mg by mouth at bedtime as needed (for pain or headache).     nystatin cream (MYCOSTATIN) Apply 1 application topically 2 (two) times daily. (Patient taking differently: Apply 1 application topically 2 (two) times daily as needed for dry skin. ) 30 g 0   omeprazole (PRILOSEC) 20 MG capsule TAKE 1 CAPSULE (20 MG TOTAL) BY MOUTH AT BEDTIME. 90 capsule 1   sodium chloride (OCEAN) 0.65 % SOLN nasal spray Place 1 spray into both nostrils as needed for congestion.     triamcinolone (NASACORT) 55 MCG/ACT AERO nasal inhaler Place 2 sprays into the nose daily. 1 Inhaler 12   TURMERIC CURCUMIN PO Take 2 capsules by mouth daily.      Turmeric, Curcuma Longa, (CURCUMIN) POWD Take by mouth.     Social History   Socioeconomic History   Marital status: Single    Spouse name: Not on file   Number of children: 0   Years of education: 16   Highest education level: Bachelor's degree (e.g., BA, AB, BS)  Occupational History   Occupation: unemployeed  Tobacco Use   Smoking status: Never Smoker   Smokeless tobacco: Never Used  Vaping Use   Vaping Use: Never used  Substance and Sexual Activity   Alcohol use: Never   Drug use: Never   Sexual activity: Never    Partners: Male  Other Topics Concern   Not on file  Social History Narrative   Not on file   Social Determinants of Health   Financial Resource Strain:    Difficulty of Paying Living Expenses:   Food Insecurity:    Worried About Radiation protection practitioner of Food in the Last Year:  Ran Out of Food in the Last Year:   Transportation Needs:    Freight forwarder (Medical):    Lack of Transportation (Non-Medical):   Physical Activity:    Days of Exercise per Week:    Minutes of Exercise per Session:   Stress:    Feeling of Stress :   Social Connections: Unknown   Frequency of Communication with Friends and Family: Not on file   Frequency of Social Gatherings with Friends and Family: Never   Attends Religious Services: Not on Scientist, clinical (histocompatibility and immunogenetics) or Organizations: Not on file   Attends Banker Meetings: Not on file   Marital Status: Not on file  Intimate Partner Violence:    Fear of Current or Ex-Partner:    Emotionally Abused:    Physically Abused:    Sexually Abused:    Family History  Problem Relation Age of Onset   Hyperlipidemia Father    Epilepsy Brother    ADD / ADHD Brother    Breast cancer Paternal Grandmother 78       Passed away at 36yo   Asthma Mother     OBJECTIVE:  Vitals:   01/12/20 1525  BP: 114/81  Pulse: 99  Resp: 14  Temp: 99 F (37.2 C)  SpO2: 99%     General  appearance: alert; appears fatigued, but nontoxic; speaking in full sentences and tolerating own secretions HEENT: NCAT; Ears: EACs clear, TMs pearly gray; Eyes: PERRL.  EOM grossly intact. Sinuses: very tender; Nose: nares patent without rhinorrhea, Throat: oropharynx clear, tonsils non erythematous or enlarged, uvula midline  Neck: supple with LAD Lungs: unlabored respirations, symmetrical air entry; cough: absent; no respiratory distress; CTAB Heart: regular rate and rhythm.  Radial pulses 2+ symmetrical bilaterally Skin: warm and dry Psychological: alert and cooperative; normal mood and affect  LABS:  No results found for this or any previous visit (from the past 24 hour(s)).   ASSESSMENT & PLAN:  1. Nasal congestion   2. Cough   3. Fever, unspecified fever cause   4. Acute non-recurrent frontal sinusitis     Prescribed doxycycline. Take as directed and to completion Use OTC zyrtec for nasal congestion, runny nose, and/or sore throat Use OTC flonase for nasal congestion and runny nose Use medications daily for symptom relief Use OTC medications like ibuprofen or tylenol as needed fever or pain Call or go to the ED if you have any new or worsening symptoms such as fever, worsening cough, shortness of breath, chest tightness, chest pain, turning blue, changes in mental status.  Reviewed expectations re: course of current medical issues. Questions answered. Outlined signs and symptoms indicating need for more acute intervention. Patient verbalized understanding. After Visit Summary given.         Moshe Cipro, NP 01/12/20 1540

## 2020-01-12 NOTE — ED Triage Notes (Signed)
Patient reports she began feeling congestion and started coughing on Friday. States she had a 100F temp. Denies fever since. Reports she is coughing up mucous. Also endorses headache.  Denies: sore throat, chills, body aches, joint pain, n/v/d  OTC: allegra, tylenol

## 2020-01-25 ENCOUNTER — Ambulatory Visit: Admit: 2020-01-25 | Discharge: 2020-01-26 | Payer: BLUE CROSS/BLUE SHIELD

## 2020-01-25 DIAGNOSIS — L910 Hypertrophic scar: Secondary | ICD-10-CM | POA: Diagnosis not present

## 2020-01-25 DIAGNOSIS — L732 Hidradenitis suppurativa: Secondary | ICD-10-CM | POA: Diagnosis not present

## 2020-01-25 DIAGNOSIS — Z79899 Other long term (current) drug therapy: Secondary | ICD-10-CM | POA: Diagnosis not present

## 2020-01-25 MED ORDER — DIAZEPAM 10 MG TABLET
ORAL_TABLET | 0 refills | 0 days | Status: CP
Start: 2020-01-25 — End: ?

## 2020-01-25 NOTE — Unmapped (Signed)
[facility] has made a third and final attempt to reach this patient to refill the following medication:Humira.      We have left voicemails on the following phone numbers: [phone], have been unable to leave messages on the following phone numbers: [phone]  and have sent a MyChart message.    Dates contacted: 7/15 7/20 7/26  Last scheduled delivery: 6/24    The patient may be at risk of non-compliance with this medication. The patient should call the [facility] at [phone] (option 4) to refill medication.    Zalyn Amend D Administrator [facility] Specialty Technician

## 2020-01-25 NOTE — Unmapped (Signed)
ASSESSMENT/PLAN:  Hidradenitis Suppurativa (HS): Hurley stage 2  - discussed the chronic, relapsing nature of this skin disease, characterized by recurring inflamed painful nodules with abscess and sinus formation, and scarring  - explained to patient that usually early lesion can remit with medical treatment, but once sinus tracts are established treatment options are limited and surgery is the only definitive treatment; however, recurrence rate can be up to 25% even with surgery  - Continue Humira 40 mg weekly   - continue Spironolactone 100 mg daily.   - On reserve:  Infliximab,  increasing to Humira 80 mg weekly   -Will set up for unroofings of R perianal (2cm), R labia majora (15mm), and L inguinal (10mm) in September  -Valium 10mg  30 minutes prior to procedure      RTC: 3 months      SUBJECTIVE:    CC: Hidradenitis Suppurativa    Erica Norman is a 39 y.o. female  who is seen as a retuning patient for Hidradenitis Suppurativa. Has had a few particular areas in R and L groin and R perianal area that flare intermittently, but very infrequent new lesions.  Tolerating adalimumab well.  Continues spironolactone 100mg  daily started 2 months ago and thinks it is somewhat helpful.      ROS: the balance of 10 systems is negative unless otherwise documented      OBJECTIVE:   Gen: Well-appearing patient, appropriate, interactive, in no acute distress  Skin: Examination of the scalp, face, neck, chest, back, abdomen, bilateral upper and lower extremities, hands, palms, soles, nails, buttocks, and external genitalia performed today and pertinent for:     location Abscess Inflamed nodule Non-inflamed nodule Draining sinus Non-draining Sinus Hurley % scar   R axilla          L axilla          R inframammary          L inframammary          Intermammary          Pubic          R inguinal  1   1     R thigh          L inguinal  1        L thigh          Scrotum/Vulva          Perianal          R buttock  1   1     L buttock Other (list)                    AN count 3

## 2020-01-26 NOTE — Unmapped (Signed)
-----   Message from Elsie Stain, MD sent at 01/25/2020 10:08 PM EDT -----  Regarding: surgery  Hi - please schedule for surgery, 11463, 11471.  HMOB or 2 hours SoVi.  Thanks -CS

## 2020-01-29 NOTE — Unmapped (Signed)
-----   Message from Elsie Stain, MD sent at 01/25/2020 10:08 PM EDT -----  Regarding: surgery  Hi - please schedule for surgery, 11463, 11471.  HMOB or 2 hours SoVi.  Thanks -CS

## 2020-01-30 ENCOUNTER — Telehealth: Payer: Medicaid Other | Admitting: Nurse Practitioner

## 2020-01-30 DIAGNOSIS — L245 Irritant contact dermatitis due to other chemical products: Secondary | ICD-10-CM

## 2020-01-30 MED ORDER — DESOXIMETASONE 0.25 % EX CREA: 1.0000 "application " | TOPICAL_CREAM | Freq: Two times a day (BID) | CUTANEOUS | 0 refills | Status: DC

## 2020-01-30 MED ORDER — PREDNISONE 10 MG PO TABS: 10.0000 mg | ORAL_TABLET | Freq: Every day | ORAL | 0 refills | Status: DC

## 2020-01-30 NOTE — Progress Notes (Signed)
E Visit for Rash  We are sorry that you are not feeling well. Here is how we plan to help!  Based on what you shared with me it looks like you have contact dermatitis.  Contact dermatitis is a skin rash caused by something that touches the skin and causes irritation or inflammation.  Your skin may be red, swollen, dry, cracked, and itch.  The rash should go away in a few days but can last a few weeks.  If you get a rash, it's important to figure out what caused it so the irritant can be avoided in the future. and I have prescribed Prednisone 10 mg daily for 5 days. I have also prescribed topicort cream topical 2x a day.    HOME CARE:   Take cool showers and avoid direct sunlight.  Apply cool compress or wet dressings.  Take a bath in an oatmeal bath.  Sprinkle content of one Aveeno packet under running faucet with comfortably warm water.  Bathe for 15-20 minutes, 1-2 times daily.  Pat dry with a towel. Do not rub the rash.  Use hydrocortisone cream.  Take an antihistamine like Benadryl for widespread rashes that itch.  The adult dose of Benadryl is 25-50 mg by mouth 4 times daily.  Caution:  This type of medication may cause sleepiness.  Do not drink alcohol, drive, or operate dangerous machinery while taking antihistamines.  Do not take these medications if you have prostate enlargement.  Read package instructions thoroughly on all medications that you take.  GET HELP RIGHT AWAY IF:   Symptoms don't go away after treatment.  Severe itching that persists.  If you rash spreads or swells.  If you rash begins to smell.  If it blisters and opens or develops a yellow-brown crust.  You develop a fever.  You have a sore throat.  You become short of breath.  MAKE SURE YOU:  Understand these instructions. Will watch your condition. Will get help right away if you are not doing well or get worse.  Thank you for choosing an e-visit. Your e-visit answers were reviewed by a board  certified advanced clinical practitioner to complete your personal care plan. Depending upon the condition, your plan could have included both over the counter or prescription medications. Please review your pharmacy choice. Be sure that the pharmacy you have chosen is open so that you can pick up your prescription now.  If there is a problem you may message your provider in MyChart to have the prescription routed to another pharmacy. Your safety is important to [place]. If you have drug allergies check your prescription carefully.  For the next 24 hours, you can use MyChart to ask questions about today's visit, request a non-urgent call back, or ask for a work or school excuse from your e-visit provider. You will get an email in the next two days asking about your experience. I hope that your e-visit has been valuable and will speed your recovery.  Marland Kitchenetime

## 2020-02-02 DIAGNOSIS — R262 Difficulty in walking, not elsewhere classified: Secondary | ICD-10-CM | POA: Diagnosis not present

## 2020-02-02 DIAGNOSIS — M25662 Stiffness of left knee, not elsewhere classified: Secondary | ICD-10-CM | POA: Diagnosis not present

## 2020-02-04 DIAGNOSIS — M25662 Stiffness of left knee, not elsewhere classified: Secondary | ICD-10-CM | POA: Diagnosis not present

## 2020-02-04 DIAGNOSIS — R262 Difficulty in walking, not elsewhere classified: Secondary | ICD-10-CM | POA: Diagnosis not present

## 2020-02-05 NOTE — Progress Notes (Signed)
5-10 minutes spent reviewing and documenting in chart.  

## 2020-02-08 DIAGNOSIS — G40011 Localization-related (focal) (partial) idiopathic epilepsy and epileptic syndromes with seizures of localized onset, intractable, with status epilepticus: Secondary | ICD-10-CM | POA: Diagnosis not present

## 2020-02-08 DIAGNOSIS — Z9689 Presence of other specified functional implants: Secondary | ICD-10-CM | POA: Diagnosis not present

## 2020-02-10 DIAGNOSIS — R262 Difficulty in walking, not elsewhere classified: Secondary | ICD-10-CM | POA: Diagnosis not present

## 2020-02-10 DIAGNOSIS — M25662 Stiffness of left knee, not elsewhere classified: Secondary | ICD-10-CM | POA: Diagnosis not present

## 2020-02-12 ENCOUNTER — Telehealth: Payer: Self-pay

## 2020-02-12 NOTE — Telephone Encounter (Signed)
Pt needs appt

## 2020-02-12 NOTE — Unmapped (Signed)
[facility] Refill Coordination Note    Specialty Medication(s) to be Shipped:   Inflammatory Disorders: Humira    Other medication(s) to be shipped: n/a     [name], DOB: 11/14/1980  Phone: [phone] (home)       All above HIPAA information was verified with patient.     Was a Nurse, learning disability used for this call? No    Completed refill call assessment today to schedule patient's medication shipment from the [facility] [phone]).       Specialty medication(s) and dose(s) confirmed: Regimen is correct and unchanged.   Changes to medications: Yalena reports no changes at this time.  Changes to insurance: No  Questions for the pharmacist: No    Confirmed patient received Welcome Packet with first shipment. The patient will receive a drug information handout for each medication shipped and additional FDA Medication Guides as required.       DISEASE/MEDICATION-SPECIFIC INFORMATION        For patients on injectable medications: Patient currently has 0 doses left.  Next injection is scheduled for 02/16/2020.    SPECIALTY MEDICATION ADHERENCE     Medication Adherence    Patient reported X missed doses in the last month: 1  Specialty Medication: Humira CF 40 mg/0.4 ml  Patient is on additional specialty medications: No  Any gaps in refill history greater than 2 weeks in the last 3 months: no  Demonstrates understanding of importance of adherence: yes  Informant: patient  Reliability of informant: reliable  Confirmed plan for next specialty medication refill: delivery by pharmacy  Refills needed for supportive medications: not needed                Humira 40mg /0.91ml: Patient has 0 days of medication on hand      SHIPPING     Shipping address confirmed in Epic.     Delivery Scheduled: Yes, Expected medication delivery date: 02/15/2020.     Medication will be delivered via Same Day Courier to the prescription address in Epic WAM.    Paxon Propes D Destry Dauber   [facility] Specialty Technician

## 2020-02-15 MED FILL — HUMIRA PEN CITRATE FREE 40 MG/0.4 ML: 28 days supply | Qty: 4 | Fill #7 | Status: AC

## 2020-02-15 MED FILL — HUMIRA PEN CITRATE FREE 40 MG/0.4 ML: SUBCUTANEOUS | 28 days supply | Qty: 4 | Fill #7

## 2020-02-15 NOTE — Telephone Encounter (Signed)
Called pt and call would not go through

## 2020-02-24 DIAGNOSIS — Z9689 Presence of other specified functional implants: Secondary | ICD-10-CM | POA: Diagnosis not present

## 2020-03-01 ENCOUNTER — Ambulatory Visit: Payer: Self-pay

## 2020-03-01 DIAGNOSIS — M25662 Stiffness of left knee, not elsewhere classified: Secondary | ICD-10-CM | POA: Diagnosis not present

## 2020-03-01 DIAGNOSIS — R262 Difficulty in walking, not elsewhere classified: Secondary | ICD-10-CM | POA: Diagnosis not present

## 2020-03-01 MED ORDER — CLINDAMYCIN HCL 300 MG CAPSULE
ORAL_CAPSULE | 2 refills | 0.00000 days | Status: CP
Start: 2020-03-01 — End: ?

## 2020-03-09 DIAGNOSIS — M25662 Stiffness of left knee, not elsewhere classified: Secondary | ICD-10-CM | POA: Diagnosis not present

## 2020-03-09 DIAGNOSIS — R262 Difficulty in walking, not elsewhere classified: Secondary | ICD-10-CM | POA: Diagnosis not present

## 2020-03-17 DIAGNOSIS — G40909 Epilepsy, unspecified, not intractable, without status epilepticus: Secondary | ICD-10-CM | POA: Diagnosis not present

## 2020-03-17 DIAGNOSIS — Z4542 Encounter for adjustment and management of neuropacemaker (brain) (peripheral nerve) (spinal cord): Secondary | ICD-10-CM | POA: Diagnosis not present

## 2020-03-21 DIAGNOSIS — Z9689 Presence of other specified functional implants: Secondary | ICD-10-CM | POA: Diagnosis not present

## 2020-03-21 DIAGNOSIS — G40011 Localization-related (focal) (partial) idiopathic epilepsy and epileptic syndromes with seizures of localized onset, intractable, with status epilepticus: Secondary | ICD-10-CM | POA: Diagnosis not present

## 2020-03-21 DIAGNOSIS — R238 Other skin changes: Secondary | ICD-10-CM | POA: Diagnosis not present

## 2020-03-29 DIAGNOSIS — Z20822 Contact with and (suspected) exposure to covid-19: Secondary | ICD-10-CM | POA: Diagnosis not present

## 2020-04-01 ENCOUNTER — Ambulatory Visit: Payer: Self-pay

## 2020-04-01 ENCOUNTER — Ambulatory Visit
Admission: EM | Admit: 2020-04-01 | Discharge: 2020-04-01 | Disposition: A | Discharge status: Home / Self Care | Payer: Medicaid Other | Attending: Family Medicine | Admitting: Family Medicine

## 2020-04-01 ENCOUNTER — Other Ambulatory Visit: Payer: Self-pay

## 2020-04-01 DIAGNOSIS — R0981 Nasal congestion: Secondary | ICD-10-CM

## 2020-04-01 DIAGNOSIS — J0100 Acute maxillary sinusitis, unspecified: Secondary | ICD-10-CM | POA: Diagnosis not present

## 2020-04-01 DIAGNOSIS — J3489 Other specified disorders of nose and nasal sinuses: Secondary | ICD-10-CM

## 2020-04-01 MED ORDER — DOXYCYCLINE HYCLATE 100 MG PO CAPS: 100.0000 mg | ORAL_CAPSULE | Freq: Two times a day (BID) | ORAL | 0 refills | Status: DC

## 2020-04-01 NOTE — Discharge Instructions (Signed)
I have sent in doxycycline for you to take twice a day for 7 days  Follow up with this office or with primary care as needed if symptoms are persisting  Follow up with the ER for trouble swallowing, trouble breathing, other concerning symptoms.

## 2020-04-01 NOTE — ED Provider Notes (Signed)
[facility]   [id number] 04/01/20 Arrival Time: 1041  WU:[id number]:SORE THROAT  SUBJECTIVE: History from: patient.  Kizzie FurnishRebecca Salce is a 39 y.o. female who presents with abrupt onset of nasal congestion, sinus pain, headache, fatigue for the last 7 days.  Denies sick exposure to Covid, strep, flu or mono, or precipitating event. Has negative history of Covid. Has completed Covid vaccines. Has tried OTC cough and cold without relief.  There are no aggravating symptoms. Denies previous symptoms in the past.     Denies fever, chills, ear pain, rhinorrhea, cough, SOB, wheezing, chest pain, nausea, rash, changes in bowel or bladder habits.    ROS: As per HPI.  All other pertinent ROS negative.     Past Medical History:  Diagnosis Date  . Cerebral palsy (HCC) 12/24/2017  . Complication of anesthesia    SEVERE MIGRAINES AFTER ANESTHESIA  . Epilepsy (HCC) 12/24/2017   stress, heat and random things bring on seizures  . 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 GERD (gastroesophageal reflux disease)   . Headache   . Hidradenitis suppurativa 12/24/2017  . History of kidney stones    H/O  . History of methicillin resistant staphylococcus aureus (MRSA) 2008   positive from axilla, left  . Hx of Kawasaki's disease   . Hydradenitis   . Seizures (HCC)    LAST SEIZURE 2012  . 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 abscess surgery Bilateral 2006   armpits and groin.12 surgeries overall.   Marland Kitchen. AXILLARY HIDRADENITIS EXCISION     MULTIPLE HYDRADENITIS SURGERIES IN [place]  . hidradenitis groin Left 04/2017   last surgery done on groin and the symptoms have returned  . HYDRADENITIS EXCISION N/A 01/16/2018   Procedure: EXCISION HIDRADENITIS GROIN;  Surgeon: Leafy RoPabon, Diego F,  MD;  Location: [facility] ORS;  Service: General;  Laterality: N/A;  . KNEE SURGERY Left 2010   tendon and meniscus repair  . NOSE SURGERY  1998   deviated septum and sinus repair  . ROBOTIC ASSISTED LAPAROSCOPIC CHOLECYSTECTOMY N/A 07/24/2018   Procedure: ROBOTIC ASSISTED LAPAROSCOPIC CHOLECYSTECTOMY;  Surgeon: Leafy RoPabon, Diego F, MD;  Location: [facility] ORS;  Service: General;  Laterality: N/A;  . TONSILLECTOMY    . TYMPANOSTOMY TUBE PLACEMENT     as a kid  . VAGUS NERVE STIMULATOR GENERATOR CHANGE  03/2015   Allergies  Allergen Reactions  . Codeine Anaphylaxis  . Penicillins Anaphylaxis and Other (See Comments)    Has patient had a PCN reaction causing immediate rash, facial/tongue/throat swelling, SOB or lightheadedness with hypotension: Yes Has patient had a PCN reaction causing severe rash involving mucus membranes or skin necrosis: No Has patient had a PCN reaction that required hospitalization: Yes Has patient had a PCN reaction occurring within the last 10 years: No If all of the above answers are "NO", then may proceed with Cephalosporin use.  Has patient had a PCN reaction causing immediate rash, facial/tongue/throat swelling, SOB or lightheadedness with hypotension: Yes Has patient had a PCN reaction causing severe rash involving mucus membranes or skin necrosis: No Has patient had a PCN reaction that required hospitalization: Yes Has patient had a PCN reaction occurring within the last 10 years: No If all of the above answers are "NO", then may proceed with Cephalosporin use. Has patient had a PCN reaction  causing immediate rash, facial/tongue/throat swelling, SOB or lightheadedness with hypotension: Yes Has patient had a PCN reaction causing severe rash involving mucus membranes or skin necrosis: No Has patient had a PCN reaction that required hospitalization: Yes Has patient had a PCN reaction occurring within the last 10 years: No If all of the above answers are "NO", then may proceed  with Cephalosporin use.  . Sulfa Antibiotics Hives  . Tape Other (See Comments)    Large Boils-PAPER TAPE OK TO USE Large Boils-PAPER TAPE OK TO USE   No current facility-administered medications on file prior to encounter.   Current Outpatient Medications on File Prior to Encounter  Medication Sig Dispense Refill  . acetaminophen (TYLENOL) 500 MG tablet Take 1,000 mg by mouth every 6 (six) hours as needed for moderate pain or headache.    Marland Kitchen aspirin-acetaminophen-caffeine (EXCEDRIN MIGRAINE) 250-250-65 MG tablet Take 2 tablets by mouth every 6 (six) hours as needed for headache or migraine.    . Brivaracetam 50 MG TABS Take 50 mg by mouth daily.    . Cholecalciferol (D3-50) 1.25 MG (50000 UT) capsule Take by mouth.    . clindamycin (CLINDAGEL) 1 % gel Apply 1 application topically daily as needed (flare up).    . desoximetasone (TOPICORT) 0.25 % cream Apply 1 application topically 2 (two) times daily. 30 g 0  . diazepam (VALIUM) 5 MG tablet Take 5 mg by mouth at bedtime as needed (for migraine).     . ergocalciferol (VITAMIN D2) 1.25 MG (50000 UT) capsule Take 50,000 Units by mouth once a week. Sundays    . felbamate (FELBATOL) 600 MG tablet Take 600 mg by mouth 3 (three) times daily.    . metoCLOPramide (REGLAN) 10 MG tablet Take 1 tablet (10 mg total) by mouth daily as needed (for migraine). 20 tablet 0  . naproxen sodium (ALEVE) 220 MG tablet Take 440 mg by mouth at bedtime as needed (for pain or headache).    . nystatin cream (MYCOSTATIN) Apply 1 application topically 2 (two) times daily. (Patient taking differently: Apply 1 application topically 2 (two) times daily as needed for dry skin. ) 30 g 0  . omeprazole (PRILOSEC) 20 MG capsule TAKE 1 CAPSULE (20 MG TOTAL) BY MOUTH AT BEDTIME. 90 capsule 1  . predniSONE (DELTASONE) 10 MG tablet Take 1 tablet (10 mg total) by mouth daily with breakfast. 5 tablet 0  . sodium chloride (OCEAN) 0.65 % SOLN nasal spray Place 1 spray into both nostrils  as needed for congestion.    . triamcinolone (NASACORT) 55 MCG/ACT AERO nasal inhaler Place 2 sprays into the nose daily. 1 Inhaler 12  . TURMERIC CURCUMIN PO Take 2 capsules by mouth daily.    . Turmeric, Curcuma Longa, (CURCUMIN) POWD Take by mouth.     Social History   Socioeconomic History  . Marital status: Single    Spouse name: Not on file  . Number of children: 0  . Years of education: 16  . Highest education level: Bachelor's degree (e.g., BA, AB, BS)  Occupational History  . Occupation: unemployeed  Tobacco Use  . Smoking status: Never Smoker  . Smokeless tobacco: Never Used  Vaping Use  . Vaping Use: Never used  Substance and Sexual Activity  . Alcohol use: Never  . Drug use: Never  . Sexual activity: Never    Partners: Male  Other Topics Concern  . Not on file  Social History Narrative  . Not on file   Social Determinants  of Health   Financial Resource Strain:   . Difficulty of Paying Living Expenses: Not on file  Food Insecurity:   . Worried About Programme researcher, broadcasting/film/video in the Last Year: Not on file  . Ran Out of Food in the Last Year: Not on file  Transportation Needs:   . Lack of Transportation (Medical): Not on file  . Lack of Transportation (Non-Medical): Not on file  Physical Activity:   . Days of Exercise per Week: Not on file  . Minutes of Exercise per Session: Not on file  Stress:   . Feeling of Stress : Not on file  Social Connections: Unknown  . Frequency of Communication with Friends and Family: Not on file  . Frequency of Social Gatherings with Friends and Family: Never  . Attends Religious Services: Not on file  . Active Member of Clubs or Organizations: Not on file  . Attends Banker Meetings: Not on file  . Marital Status: Not on file  Intimate Partner Violence:   . Fear of Current or Ex-Partner: Not on file  . Emotionally Abused: Not on file  . Physically Abused: Not on file  . Sexually Abused: Not on file   Family  History  Problem Relation Age of Onset  . Hyperlipidemia Father   . Epilepsy Brother   . ADD / ADHD Brother   . Breast cancer Paternal Grandmother 42       Passed away at 36yo  . Asthma Mother     OBJECTIVE:  Vitals:   04/01/20 1052  BP: 91/60  Pulse: 93  Resp: 14  Temp: 98.5 F (36.9 C)  SpO2: 98%     General appearance: alert; appears fatigued, but nontoxic, speaking in full sentences and managing own secretions HEENT: NCAT; Ears: EACs clear, TMs pearly gray with visible cone of light, without erythema; Eyes: PERRL, EOMI grossly; Nose: no obvious rhinorrhea; Throat: oropharynx clear, tonsils 1+ and mildly erythematous without white tonsillar exudates, uvula midline; Sinuses: tender to palpation Neck: supple without LAD Lungs: CTA bilaterally without adventitious breath sounds; cough absent Heart: regular rate and rhythm.  Radial pulses 2+ symmetrical bilaterally Skin: warm and dry Psychological: alert and cooperative; normal mood and affect  LABS: No results found for this or any previous visit (from the past 24 hour(s)).   ASSESSMENT & PLAN:  1. Acute non-recurrent maxillary sinusitis   2. Sinus pain   3. Nasal congestion     Meds ordered this encounter  Medications  . doxycycline (VIBRAMYCIN) 100 MG capsule    Sig: Take 1 capsule (100 mg total) by mouth 2 (two) times daily.    Dispense:  14 capsule    Refill:  0    Order Specific Question:   Supervising Provider    Answer:   Merrilee Jansky [id number]    Acute Sinusitis Push fluids and get rest Prescribed doxycycline 100 mg BID x 7 days Take as directed and to completion.  Drink warm or cool liquids, use throat lozenges, or popsicles to help alleviate symptoms Take OTC ibuprofen or tylenol as needed for pain May use Zyrtec D and flonase to help alleviate symptoms Follow up with PCP if symptoms persist Return or go to ER if you have any new or worsening symptoms such as fever, chills, nausea, vomiting,  worsening sore throat, cough, abdominal pain, chest pain, changes in bowel or bladder habits.   Reviewed expectations re: course of current medical issues. Questions answered. Outlined signs and symptoms indicating  need for more acute intervention. Patient verbalized understanding. After Visit Summary given.          Moshe Cipro, NP 04/01/20 1117

## 2020-04-01 NOTE — ED Triage Notes (Signed)
Patient c/o sinus problem. Reports she has had two negative COVID tests within the last week.

## 2020-04-04 ENCOUNTER — Ambulatory Visit: Admit: 2020-04-04 | Discharge: 2020-04-05 | Payer: BLUE CROSS/BLUE SHIELD

## 2020-04-04 DIAGNOSIS — L732 Hidradenitis suppurativa: Secondary | ICD-10-CM | POA: Diagnosis not present

## 2020-04-04 MED ORDER — HYDROCODONE 5 MG-ACETAMINOPHEN 325 MG TABLET
ORAL_TABLET | ORAL | 0 refills | 0.00000 days | Status: CP
Start: 2020-04-04 — End: ?

## 2020-04-04 MED ORDER — ADALIMUMAB 80 MG/0.8 ML SUBCUTANEOUS PEN KIT
SUBCUTANEOUS | 11 refills | 0.00000 days | Status: CP
Start: 2020-04-04 — End: ?
  Filled 2020-04-12: qty 4, 28d supply, fill #0

## 2020-04-04 NOTE — Unmapped (Signed)
ASSESSMENT/PLAN:  Hidradenitis Suppurativa (HS): Hurley stage 2  - discussed the chronic, relapsing nature of this skin disease, characterized by recurring inflamed painful nodules with abscess and sinus formation, and scarring  - explained to patient that usually early lesion can remit with medical treatment, but once sinus tracts are established treatment options are limited and surgery is the only definitive treatment; however, recurrence rate can be up to 25% even with surgery  - Continue Humira 40 mg weekly for the moment, but we will try to increase to 80 mg weekly.  We discussed that this is higher than typical dosing but given recalcitrance to 40 mg weekly she would like to proceed.  - continue Spironolactone 100 mg daily.   - On reserve:  Infliximab,   -deroofings as described below  -Valium 10mg  30 minutes prior to procedure    Unroofing procedure with excision for hidradedinitis in location R labia, L inguinal, perianal:  Risk, benefits, and alternatives were discussed.  Following alcohol prep, anesthesia with 0.5% lidocaine with epinephrine was first performed with a total of 25ml, and then fistula probe and/or forceps was used to delineate the involved area.  Iris scissor was used to open all detectable sinuses. Complex sequential unroofing and wound contouring with beveling was performed with scissor or blade as appropriate to assure second intention healing.  Hemostasis was obtained in the usual fashion with pressure, AlCl solution, and/or cautery.  Area was dressed with petrolatum and bandage.  Wound care instructions given. We will contact the patient with results when available.  Size of wound 20 mm L inguinal, 30 mm R labia (partial vulvectomy), 25mm perianal.    RTC: 3 months      SUBJECTIVE:    CC: Hidradenitis Suppurativa    Erica Norman is a 39 y.o. female  who is seen as a retuning patient for Hidradenitis Suppurativa. Has had a few particular areas in R and L groin and R perianal area that flare intermittently, and has mostly been stable otherwise until the last few weeks when several new inflammatory nodules arose on the groin and buttocks.  Tolerating adalimumab well at 40 mg weekly dosing.  Continues spironolactone 100mg  daily started in spring 2021 and thinks it is somewhat helpful.  She is quite concerned about the new nodules that have arisen lately given that she is mostly been stable for months before this.      ROS: the balance of 10 systems is negative unless otherwise documented      OBJECTIVE:   Gen: Well-appearing patient, appropriate, interactive, in no acute distress  Skin: Examination of the scalp, face, neck, chest, back, abdomen, bilateral upper and lower extremities, hands, palms, soles, nails, buttocks, and external genitalia performed today and pertinent for:     location Abscess Inflamed nodule Non-inflamed nodule Draining sinus Non-draining Sinus Hurley % scar   R axilla          L axilla          R inframammary          L inframammary          Intermammary          Pubic          R inguinal  3   1     R thigh          L inguinal  1   1     L thigh          Scrotum/Vulva  Perianal          R buttock  2   1     L buttock  1        Other (list)                    AN count 7

## 2020-04-05 NOTE — Unmapped (Signed)
Managing your wound after skin surgery    Please avoid strenuous activity for 48 hours and all heavy exercise for 1 week  If your wound is on your arm, leg, or shoulder, then avoid heavy lifting, straining, or exercise with that limb for at least one week.  If your wound is on the head or neck, avoid bending over if at all possible.  Sleep with your head elevated for 48 hours to reduce the risk of bleeding.  Please don???t smoke for 3 weeks; smoking prevents healthy wound healing  Pain management: Use  600 mg of acetaminophen (Tylenol ) every 4 hours, or medication we prescribed as needed. Pain should decrease steadily for the 1st few days after your surgery.   Possible complications:  Bleeding: a small amount of blood on the bandage is normal.   If there is a significant bleeding into the bandage or beneath the stitches (this will look like swelling and purple discoloration), apply firm pressure  with a cloth or bandage for 20 minutes without interruption. Repeat if bleeding continues. If it persists, please call [phone]. During nonoffice hours use the message of this number to contact the on-call dermatologist or go to the emergency room.  Infection: usually indicated by pain that increases 4 to 6 days after surgery. Mild redness, swelling, soreness are normal, but, with increasing redness, pain, drainage, and swelling, you should contact our office.  Decreased sensation, increased sensitivity, and itching may last for 18 months.  Scarring: scars mature for one year after surgery. Reducing motion and tension over the wound for the 1st month, as well as using sun protection, reduces scarring.  Bruising is normal around the wound and resolves over 2 to 3 weeks.  For wounds with dissolving stitches:  Please keep the original bandage in place for at least 48 hours. You can remove the large outer layer at that time and keep the small bandage clean and dry for one week or until you return. If the  bandage becomes soiled, wet, or detached, you can reinforce it with tape or add petrolatum  (Vaseline) to the stitches, then place a fresh, clean, nonstick bandage (telfa) with tape or use a non stick Band-Aid.  For wounds with stitches that need to be removed:  Please keep the original bandage in place for at least 48 hours. You can remove the large outer bandage at that time. You may leave the smaller bandage in place until it falls off by itself, or change it after it gets wet with bathing. To change, apply a layer of petrolatum (Vaseline) over top of the Steri-Strips (thin white stripes) and cover with a nonstick stick bandage (telfa) and  paper tape or use a non stick Band-Aid.  If you???re changing the bandage, you can gently remove any crust with warm water and mild soap using a soft cloth a Q-tip. Do not use triple antibiotic ointment, Neosporin, or hydrogen peroxide.  After the stitches and bandages have been removed: clean daily with water with warm water and gentle soap using a soft cloth. You can continue to use a small bandage and sunscreen for one to 2 months or until fully healed.    If instructed to use vinegar soaks, use a fresh bottle of white vinegar and bottled water to make the soak  Please add 1 cup of white vinegar to a quart of room temperature water.  Soak affected area for 10-20 minutes once or twice a day.

## 2020-04-06 DIAGNOSIS — R569 Unspecified convulsions: Secondary | ICD-10-CM | POA: Diagnosis not present

## 2020-04-06 NOTE — Unmapped (Signed)
Clinical Assessment Needed For: Dose Change  Medication: Humira (CF) pens 80mg /0.40ml  Last Fill Date/Day Supply: 02/15/20 / 28 days  Copay $3  Was previous dose already scheduled to fill: No    Notes to Pharmacist: Dose increase from 40mg  to 80 mg weekly

## 2020-04-06 NOTE — Unmapped (Signed)
Contacted patient with result via MyChart on 04/06/20  No tracking

## 2020-04-06 NOTE — Unmapped (Signed)
Kalani's dose of Humira has increased to 80 mg once weekly.  The prior dose was causing frequent flares and painful nodules.  She will finish her last dose of 40 mg on Monday and change to 80 mg the following week.        [facility] Clinical Assessment & Refill Coordination Note    Jovee Dettinger, DOB: 10-Feb-1981  Phone: [phone] (home)     All above HIPAA information was verified with patient.     Was a Nurse, learning disability used for this call? No    Specialty Medication(s):   Inflammatory Disorders: Humira     Current Outpatient Medications   Medication Sig Dispense Refill   ??? adalimumab (HUMIRA,CF, PEN) 80 mg/0.8 mL PnKt Inject the contents of 1 pen (80mg ) under the skin once weekly. 4 each 11   ??? ADALIMUMAB PEN CITRATE FREE 40 MG/0.4 ML Inject the contents of 1 pen (40 mg) under the skin once weekly 4 each 11   ??? ascorbic acid, vitamin C, (VITAMIN C) 1000 MG tablet Take 1,000 mg by mouth.     ??? brivaracetam (BRIVIACT) 50 mg tablet Take 50 mg by mouth.     ??? cholecalciferol, vitamin D3, 1,250 mcg (50,000 unit) capsule Take 50,000 Units by mouth.     ??? clindamycin (CLEOCIN) 300 MG capsule 300mg  twice daily 63 capsule 2   ??? diazePAM (VALIUM) 10 MG tablet Take 10mg  30 minutes prior to procedure 1 tablet 0   ??? diazePAM (VALIUM) 5 MG tablet Take 5 mg by mouth.     ??? diazePAM (VALIUM) 5 MG tablet Take 5 mg by mouth every hour as needed.     ??? FELBATOL 600 mg tablet      ??? HYDROcodone-acetaminophen (NORCO) 5-325 mg per tablet Take 1-2 tablets every 6 hours as needed for pain 15 tablet 0   ??? magnesium oxide (MAG-OX) 400 mg (241.3 mg magnesium) tablet Take 400 mg by mouth daily.     ??? melatonin 5 mg tablet Take 7.5 mg by mouth.     ??? omeprazole (PRILOSEC) 20 MG capsule Take 20 mg by mouth.     ??? omeprazole (PRILOSEC) 20 MG capsule Take 20 mg by mouth.     ??? sodium chloride (AYR) 0.65 % Drop 1 spray into each nostril.     ??? spironolactone (ALDACTONE) 100 MG tablet Take 1 tablet (100 mg total) by mouth daily. 30 tablet 3   ??? turmeric, bulk, 95 % Powd Take 2 capsules by mouth.       No current facility-administered medications for this visit.        Changes to medications: Laurrie reports starting the following medications: Spironolactone    Allergies   Allergen Reactions   ??? Codeine Anaphylaxis   ??? Penicillins Anaphylaxis and Other (See Comments)     Has patient had a PCN reaction causing immediate rash, facial/tongue/throat swelling, SOB or lightheadedness with hypotension: Yes  Has patient had a PCN reaction causing severe rash involving mucus membranes or skin necrosis: No  Has patient had a PCN reaction that required hospitalization: Yes  Has patient had a PCN reaction occurring within the last 10 years: No  If all of the above answers are NO, then may proceed with Cephalosporin use.  Has patient had a PCN reaction causing immediate rash, facial/tongue/throat swelling, SOB or lightheadedness with hypotension: Yes  Has patient had a PCN reaction causing severe rash involving mucus membranes or skin necrosis: No  Has  patient had a PCN reaction that required hospitalization: Yes  Has patient had a PCN reaction occurring within the last 10 years: No  If all of the above answers are NO, then may proceed with Cephalosporin use.     ??? Sulfa (Sulfonamide Antibiotics) Hives   ??? Adhesive Tape-Silicones Other (See Comments)     Large Boils-PAPER TAPE OK TO USE       Changes to allergies: No    SPECIALTY MEDICATION ADHERENCE     Humira 80/0.8 mg/ml: 0 days of medicine on hand   Humira 40/0.4 mg/ml: 11 days of medicine on hand       Medication Adherence    Patient reported X missed doses in the last month: 1-2  Specialty Medication: Humira CF 40 mg/0.4 ml   Patient is on additional specialty medications: No  Informant: patient  Confirmed plan for next specialty medication refill: delivery by pharmacy  Refills needed for supportive medications: not needed          Specialty medication(s) dose(s) confirmed: Patient reports changes to the regimen as follows: increase in dose to 80 mg weekly     Are there any concerns with adherence? No    Adherence counseling provided? Not needed    CLINICAL MANAGEMENT AND INTERVENTION      Clinical Benefit Assessment:    Do you feel the medicine is effective or helping your condition? No    Clinical Benefit counseling provided? Progress note from 04/04/20 shows evidence of clinical benefit    Adverse Effects Assessment:    Are you experiencing any side effects? No    Are you experiencing difficulty administering your medicine? No    Quality of Life Assessment:    How many days over the past month did your HS  keep you from your normal activities? For example, brushing your teeth or getting up in the morning. 0    Have you discussed this with your provider? Not needed    Therapy Appropriateness:    Is therapy appropriate? Yes, therapy is appropriate and should be continued    DISEASE/MEDICATION-SPECIFIC INFORMATION      For patients on injectable medications: Patient currently has 1 doses left.  Next injection is scheduled for 04/11/20.    PATIENT SPECIFIC NEEDS     - Does the patient have any physical, cognitive, or cultural barriers? No    - Is the patient high risk? No    - Does the patient require a Care Management Plan? No     - Does the patient require physician intervention or other additional services (i.e. nutrition, smoking cessation, social work)? No      SHIPPING     Specialty Medication(s) to be Shipped:   Inflammatory Disorders: Humira    Other medication(s) to be shipped: No additional medications requested for fill at this time     Changes to insurance: No    Delivery Scheduled: Yes, Expected medication delivery date: 04/12/20.     Medication will be delivered via Same Day Courier to the confirmed prescription address in [name].    The patient will receive a drug information handout for each medication shipped and additional FDA Medication Guides as required.  Verified that patient has previously received a Conservation officer, historic buildings.    All of the patient's questions and concerns have been addressed.    Weber Monnier Vangie Bicker   [facility] Specialty Pharmacist

## 2020-04-08 ENCOUNTER — Telehealth: Payer: Self-pay

## 2020-04-08 NOTE — Telephone Encounter (Signed)
Copied from CRM [id number]. Topic: Appointment Scheduling - Scheduling Inquiry for Clinic >> Feb 26, 2020 12:38 PM Crist Infante wrote: Reason for CRM: pt used to see Maurice Small at [facility], but emily has moved from this practice.  Pt states she really liked your profile and would like to know if you will accept her as a pt? Doesn't have to be anytime real soon >> Apr 08, 2020  8:24 AM Crist Infante wrote: Sorry, Dr Beryle Flock >> Feb 26, 2020  1:23 PM Marlene Lard, [place] wrote: Which provider?

## 2020-04-11 ENCOUNTER — Encounter: Payer: Medicaid Other | Admitting: Family Medicine

## 2020-04-12 MED FILL — HUMIRA(CF) PEN 80 MG/0.8 ML SUBCUTANEOUS KIT: 28 days supply | Qty: 4 | Fill #0 | Status: AC

## 2020-05-05 NOTE — Unmapped (Signed)
[facility] Refill Coordination Note    Specialty Medication(s) to be Shipped:   Inflammatory Disorders: Humira    Other medication(s) to be shipped: No additional medications requested for fill at this time     Erica Norman, DOB: [birth year]  Phone: [phone] (home)       All above HIPAA information was verified with patient.     Was a Nurse, learning disability used for this call? No    Completed refill call assessment today to schedule patient's medication shipment from the [facility] [phone]).       Specialty medication(s) and dose(s) confirmed: Regimen is correct and unchanged.   Changes to medications: Erica Norman reports no changes at this time.  Changes to insurance: No  Questions for the pharmacist: No    Confirmed patient received Welcome Packet with first shipment. The patient will receive a drug information handout for each medication shipped and additional FDA Medication Guides as required.       DISEASE/MEDICATION-SPECIFIC INFORMATION        For patients on injectable medications: Patient currently has 0 doses left.  Next injection is scheduled for 05/09/2020.    SPECIALTY MEDICATION ADHERENCE     Medication Adherence    Patient reported X missed doses in the last month: 0  Specialty Medication: Humira CF 80 mg/0.8 ml  Patient is on additional specialty medications: No  Any gaps in refill history greater than 2 weeks in the last 3 months: no  Demonstrates understanding of importance of adherence: yes  Informant: patient  Reliability of informant: reliable  Confirmed plan for next specialty medication refill: delivery by pharmacy  Refills needed for supportive medications: not needed                      SHIPPING     Shipping address confirmed in Epic.     Delivery Scheduled: Yes, Expected medication delivery date: 05/09/2020.     Medication will be delivered via Same Day Courier to the prescription address in Epic WAM.    Erica Norman   [facility] Specialty Technician

## 2020-05-09 MED FILL — HUMIRA(CF) PEN 80 MG/0.8 ML SUBCUTANEOUS KIT: 28 days supply | Qty: 4 | Fill #1

## 2020-05-09 MED FILL — HUMIRA(CF) PEN 80 MG/0.8 ML SUBCUTANEOUS KIT: 28 days supply | Qty: 4 | Fill #1 | Status: AC

## 2020-05-17 ENCOUNTER — Other Ambulatory Visit: Payer: Self-pay

## 2020-05-17 ENCOUNTER — Ambulatory Visit: Payer: Medicaid Other | Admitting: Family Medicine

## 2020-05-17 ENCOUNTER — Encounter: Payer: Self-pay | Admitting: Family Medicine

## 2020-05-17 VITALS — BP 122/84 | HR 87 | Temp 98.1°F | Resp 16 | Ht 64.0 in | Wt 242.8 lb

## 2020-05-17 DIAGNOSIS — G40909 Epilepsy, unspecified, not intractable, without status epilepticus: Secondary | ICD-10-CM

## 2020-05-17 DIAGNOSIS — Z9689 Presence of other specified functional implants: Secondary | ICD-10-CM

## 2020-05-17 DIAGNOSIS — Z1231 Encounter for screening mammogram for malignant neoplasm of breast: Secondary | ICD-10-CM

## 2020-05-17 DIAGNOSIS — G809 Cerebral palsy, unspecified: Secondary | ICD-10-CM | POA: Diagnosis not present

## 2020-05-17 DIAGNOSIS — L732 Hidradenitis suppurativa: Secondary | ICD-10-CM | POA: Diagnosis not present

## 2020-05-17 DIAGNOSIS — K219 Gastro-esophageal reflux disease without esophagitis: Secondary | ICD-10-CM | POA: Diagnosis not present

## 2020-05-17 DIAGNOSIS — Z124 Encounter for screening for malignant neoplasm of cervix: Secondary | ICD-10-CM

## 2020-05-17 DIAGNOSIS — E559 Vitamin D deficiency, unspecified: Secondary | ICD-10-CM

## 2020-05-17 DIAGNOSIS — D84821 Immunodeficiency due to drugs: Secondary | ICD-10-CM | POA: Insufficient documentation

## 2020-05-17 DIAGNOSIS — J310 Chronic rhinitis: Secondary | ICD-10-CM

## 2020-05-17 DIAGNOSIS — E785 Hyperlipidemia, unspecified: Secondary | ICD-10-CM

## 2020-05-17 DIAGNOSIS — Z79899 Other long term (current) drug therapy: Secondary | ICD-10-CM

## 2020-05-17 DIAGNOSIS — Z0000 Encounter for general adult medical examination without abnormal findings: Secondary | ICD-10-CM | POA: Diagnosis not present

## 2020-05-17 DIAGNOSIS — Z6841 Body Mass Index (BMI) 40.0 and over, adult: Secondary | ICD-10-CM

## 2020-05-17 DIAGNOSIS — D75839 Thrombocytosis, unspecified: Secondary | ICD-10-CM

## 2020-05-17 DIAGNOSIS — J329 Chronic sinusitis, unspecified: Secondary | ICD-10-CM

## 2020-05-17 LAB — CBC WITH DIFFERENTIAL/PLATELET
Eosinophils Relative: 2.1 %
Neutrophils Relative %: 60.7 %

## 2020-05-17 MED ORDER — IPRATROPIUM BROMIDE 0.03 % NA SOLN: 2.0000 | Freq: Two times a day (BID) | NASAL | 12 refills | Status: DC

## 2020-05-17 MED ORDER — LORATADINE 10 MG PO TABS: 10.0000 mg | ORAL_TABLET | Freq: Every day | ORAL | 3 refills | Status: DC

## 2020-05-17 MED ORDER — OMEPRAZOLE 20 MG PO CPDR: 20.0000 mg | DELAYED_RELEASE_CAPSULE | Freq: Every day | ORAL | 3 refills | Status: DC

## 2020-05-17 NOTE — Patient Instructions (Signed)
[place] at [facility] - call to schedule mammogram [place],  [place]  [phone] Get Driving Directions Main: [phone]     Preventive Care 17-40 Years Old, Female Preventive care refers to visits with your health care provider and lifestyle choices that can promote health and wellness. This includes:  A yearly physical exam. This may also be called an annual well check.  Regular dental visits and eye exams.  Immunizations.  Screening for certain conditions.  Healthy lifestyle choices, such as eating a healthy diet, getting regular exercise, not using drugs or products that contain nicotine and tobacco, and limiting alcohol use. What can I expect for my preventive care visit? Physical exam Your health care provider will check your:  Height and weight. This may be used to calculate body mass index (BMI), which tells if you are at a healthy weight.  Heart rate and blood pressure.  Skin for abnormal spots. Counseling Your health care provider may ask you questions about your:  Alcohol, tobacco, and drug use.  Emotional well-being.  Home and relationship well-being.  Sexual activity.  Eating habits.  Work and work Statistician.  Method of birth control.  Menstrual cycle.  Pregnancy history. What immunizations do I need?  Influenza (flu) vaccine  This is recommended every year. Tetanus, diphtheria, and pertussis (Tdap) vaccine  You may need a Td booster every 10 years. Varicella (chickenpox) vaccine  You may need this if you have not been vaccinated. Zoster (shingles) vaccine  You may need this after age 26. Measles, mumps, and rubella (MMR) vaccine  You may need at least one dose of MMR if you were born in 1957 or later. You may also need a second dose. Pneumococcal conjugate (PCV13) vaccine  You may need this if you have certain conditions and were not previously vaccinated. Pneumococcal polysaccharide  (PPSV23) vaccine  You may need one or two doses if you smoke cigarettes or if you have certain conditions. Meningococcal conjugate (MenACWY) vaccine  You may need this if you have certain conditions. Hepatitis A vaccine  You may need this if you have certain conditions or if you travel or work in places where you may be exposed to hepatitis A. Hepatitis B vaccine  You may need this if you have certain conditions or if you travel or work in places where you may be exposed to hepatitis B. Haemophilus influenzae type b (Hib) vaccine  You may need this if you have certain conditions. Human papillomavirus (HPV) vaccine  If recommended by your health care provider, you may need three doses over 6 months. You may receive vaccines as individual doses or as more than one vaccine together in one shot (combination vaccines). Talk with your health care provider about the risks and benefits of combination vaccines. What tests do I need? Blood tests  Lipid and cholesterol levels. These may be checked every 5 years, or more frequently if you are over 29 years old.  Hepatitis C test.  Hepatitis B test. Screening  Lung cancer screening. You may have this screening every year starting at age 60 if you have a 30-pack-year history of smoking and currently smoke or have quit within the past 15 years.  Colorectal cancer screening. All adults should have this screening starting at age 29 and continuing until age 46. Your health care provider may recommend screening at age 64 if you are at increased risk. You will have tests every 1-10 years, depending on your results and  the type of screening test.  Diabetes screening. This is done by checking your blood sugar (glucose) after you have not eaten for a while (fasting). You may have this done every 1-3 years.  Mammogram. This may be done every 1-2 years. Talk with your health care provider about when you should start having regular mammograms. This may  depend on whether you have a family history of breast cancer.  BRCA-related cancer screening. This may be done if you have a family history of breast, ovarian, tubal, or peritoneal cancers.  Pelvic exam and Pap test. This may be done every 3 years starting at age 52. Starting at age 40, this may be done every 5 years if you have a Pap test in combination with an HPV test. Other tests  Sexually transmitted disease (STD) testing.  Bone density scan. This is done to screen for osteoporosis. You may have this scan if you are at high risk for osteoporosis. Follow these instructions at home: Eating and drinking  Eat a diet that includes fresh fruits and vegetables, whole grains, lean protein, and low-fat dairy.  Take vitamin and mineral supplements as recommended by your health care provider.  Do not drink alcohol if: ? Your health care provider tells you not to drink. ? You are pregnant, may be pregnant, or are planning to become pregnant.  If you drink alcohol: ? Limit how much you have to 0-1 drink a day. ? Be aware of how much alcohol is in your drink. In the [place] one drink equals one 12 oz bottle of beer (355 mL), one 5 oz glass of wine (148 mL), or one 1 oz glass of hard liquor (44 mL). Lifestyle  Take daily care of your teeth and gums.  Stay active. Exercise for at least 30 minutes on 5 or more days each week.  Do not use any products that contain nicotine or tobacco, such as cigarettes, e-cigarettes, and chewing tobacco. If you need help quitting, ask your health care provider.  If you are sexually active, practice safe sex. Use a condom or other form of birth control (contraception) in order to prevent pregnancy and STIs (sexually transmitted infections).  If told by your health care provider, take low-dose aspirin daily starting at age 36. What's next?  Visit your health care provider once a year for a well check visit.  Ask your health care provider how often you should  have your eyes and teeth checked.  Stay up to date on all vaccines. This information is not intended to replace advice given to you by your health care provider. Make sure you discuss any questions you have with your health care provider. Document Revised: 02/27/2018 Document Reviewed: 02/27/2018 Elsevier Patient Education  2020 [employer].

## 2020-05-17 NOTE — Progress Notes (Signed)
Patient: Destiny Flores, Female    DOB: 01-14-1981, 39 y.o.   MRN: [id number] Hubbard Hartshorn, FNP Visit Date: 05/17/2020  Today's Provider: Delsa Grana, PA-C   Chief Complaint  Patient presents with  . Annual Exam   Subjective:   Annual physical exam:  Destiny Flores is a 39 y.o. female who presents today for complete physical exam:  Exercise/Activity:  Limited due cerebral palsy and several different surgeries/procedures lately - only active about 1 day a week for minimal amount of time Sleep: good - works with neurology on managing sleep - triggers migraines if not sleeping well, using melatonin  Seizures, cerebral palsy, HS, migraines, vit D deficiency, high cholesterol, vit D deficiency Pt has been unable to ever to pap/pelvic due to spasms, severe pain, hx of procedure when she was 39 y/o that was very traumatic, past PCPs have tried to do PAP/cervical ca screening and have been unable to, pt not sexually active  Due for mammogram - hx of breast CA in paternal grandma, no other family hx, no CRC hx, ovarian or uterine CA - most female relatives had hysterectomies Pt has normal menses - regular cycles - monthly, menses last 5-7 d, no heavy bleeding, denies heavy cramping, normal urination and BMs, no concerns or lower abd or pelvic complaints   USPSTF grade A and B recommendations - reviewed and addressed today  Depression:  Phq 9 completed today by patient, was reviewed by me with patient in the room PHQ score is neg, pt feels good PHQ 2/9 Scores 05/17/2020 07/14/2019 04/28/2019 04/10/2019  PHQ - 2 Score 1 0 0 0  PHQ- 9 Score - 0 0 0   Depression screen [facility] 2/9 05/17/2020 07/14/2019 04/28/2019 04/10/2019 01/20/2019  Decreased Interest 0 0 0 0 0  Down, Depressed, Hopeless 1 0 0 0 0  PHQ - 2 Score 1 0 0 0 0  Altered sleeping - 0 0 0 0  Tired, decreased energy - 0 0 0 0  Change in appetite - 0 0 0 0  Feeling bad or failure about yourself  - 0 0 0 0  Trouble concentrating - 0  0 0 0  Moving slowly or fidgety/restless - 0 0 0 0  Suicidal thoughts - 0 0 0 0  PHQ-9 Score - 0 0 0 0  Difficult doing work/chores - Not difficult at all - Not difficult at all Not difficult at all    Alcohol screening:   Office Visit from 05/17/2020 in [facility]  AUDIT-C Score 0      Immunizations and Health Maintenance: Health Maintenance  Topic Date Due  . PAP SMEAR-Modifier  Never done  . TETANUS/TDAP  03/06/2028  . INFLUENZA VACCINE  Completed  . COVID-19 Vaccine  Completed  . Hepatitis C Screening  Completed  . HIV Screening  Completed     Hep C Screening: done  STD testing and prevention (HIV/chl/gon/syphilis):  see above, no additional testing desired by pt today  None needed  Intimate partner violence:  Feels safe  Sexual History/Pain during Intercourse: Single  Menstrual History/LMP/Abnormal Bleeding: see above No LMP recorded.  Incontinence Symptoms: denies  Breast cancer:   Due - ordered Last Mammogram: *see HM list above BRCA gene screening: none  Cervical cancer screening: due Pt denies family hx of cancers - breast, ovarian, uterine, colon:     Osteoporosis:   Discussion on osteoporosis per age, including high calcium and vitamin D supplementation, weight bearing exercises Vit D  deficiency, on OTC high dose  Skin cancer:  Hx of skin CA -  NO - sees dermatology regularly Discussed atypical lesions   Colorectal cancer:   Colonoscopy is not due per age   Discussed concerning signs and sx of CRC, pt denies  Lung cancer:   Low Dose CT Chest recommended if Age 49-80 years, 30 pack-year currently smoking OR have quit w/in 15years. Patient does not qualify.    Social History   Tobacco Use  . Smoking status: Never Smoker  . Smokeless tobacco: Never Used  Vaping Use  . Vaping Use: Never used  Substance Use Topics  . Alcohol use: Never  . Drug use: Never       [facility] Visit from 05/17/2020 in [facility]  AUDIT-C Score 0      Family History  Problem Relation Age of Onset  . Hyperlipidemia Father   . Epilepsy Brother   . ADD / ADHD Brother   . Breast cancer Paternal Grandmother 78       Passed away at 32yo  . Asthma Mother      Blood pressure/Hypertension: BP Readings from Last 3 Encounters:  05/17/20 122/84  04/01/20 91/60  01/12/20 114/81    Weight/Obesity: Wt Readings from Last 3 Encounters:  05/17/20 242 lb 12.8 oz (110.1 kg)  07/14/19 230 lb (104.3 kg)  04/10/19 224 lb 12.8 oz (102 kg)   BMI Readings from Last 3 Encounters:  05/17/20 41.68 kg/m  07/14/19 39.48 kg/m  04/10/19 38.59 kg/m     Lipids:  Lab Results  Component Value Date   CHOL 206 (H) 04/10/2019   CHOL 215 (H) 03/06/2018   Lab Results  Component Value Date   HDL 48 (L) 04/10/2019   HDL 51 03/06/2018   Lab Results  Component Value Date   LDLCALC 132 (H) 04/10/2019   LDLCALC 134 (H) 03/06/2018   Lab Results  Component Value Date   TRIG 146 04/10/2019   TRIG 160 (H) 03/06/2018   Lab Results  Component Value Date   CHOLHDL 4.3 04/10/2019   CHOLHDL 4.2 03/06/2018   No results found for: LDLDIRECT Based on the results of lipid panel his/her cardiovascular risk factor ( using [place] )  in the next 10 years is: The 10-year ASCVD risk score Mikey Bussing DC Brooke Bonito., et al., 2013) is: 0.7%   Values used to calculate the score:     Age: 55 years     Sex: Female     Is Non-Hispanic African American: No     Diabetic: No     Tobacco smoker: No     Systolic Blood Pressure: 761 mmHg     Is BP treated: No     HDL Cholesterol: 48 mg/dL     Total Cholesterol: 206 mg/dL Glucose:  Glucose, Bld  Date Value Ref Range Status  04/10/2019 87 65 - 99 mg/dL Final    Comment:    .            Fasting reference interval .   06/05/2018 78 65 - 99 mg/dL Final    Comment:    .            Fasting reference interval .   05/06/2018 93 70 - 99 mg/dL Final   Hypertension: BP Readings from Last 3  Encounters:  05/17/20 122/84  04/01/20 91/60  01/12/20 114/81   Obesity: Wt Readings from Last 3 Encounters:  05/17/20 242 lb 12.8 oz (110.1 kg)  07/14/19 230 lb (104.3 kg)  04/10/19 224 lb 12.8 oz (102 kg)   BMI Readings from Last 3 Encounters:  05/17/20 41.68 kg/m  07/14/19 39.48 kg/m  04/10/19 38.59 kg/m      Advanced Care Planning:  A voluntary discussion about advance care planning including the explanation and discussion of advance directives.     Social History      She        Social History   Socioeconomic History  . Marital status: Single    Spouse name: Not on file  . Number of children: 0  . Years of education: 16  . Highest education level: Bachelor's degree (e.g., BA, AB, BS)  Occupational History  . Occupation: unemployeed  Tobacco Use  . Smoking status: Never Smoker  . Smokeless tobacco: Never Used  Vaping Use  . Vaping Use: Never used  Substance and Sexual Activity  . Alcohol use: Never  . Drug use: Never  . Sexual activity: Never    Partners: Male  Other Topics Concern  . Not on file  Social History Narrative  . Not on file   Social Determinants of Health   Financial Resource Strain: Low Risk   . Difficulty of Paying Living Expenses: Not hard at all  Food Insecurity: Food Insecurity Present  . Worried About Charity fundraiser in the Last Year: Sometimes true  . Ran Out of Food in the Last Year: Sometimes true  Transportation Needs: No Transportation Needs  . Lack of Transportation (Medical): No  . Lack of Transportation (Non-Medical): No  Physical Activity: Insufficiently Active  . Days of Exercise per Week: 1 day  . Minutes of Exercise per Session: 10 min  Stress: Stress Concern Present  . Feeling of Stress : To some extent  Social Connections: Moderately Integrated  . Frequency of Communication with Friends and Family: More than three times a week  . Frequency of Social Gatherings with Friends and Family: Twice a week  .  Attends Religious Services: 1 to 4 times per year  . Active Member of Clubs or Organizations: Yes  . Attends Archivist Meetings: More than 4 times per year  . Marital Status: Never married    Family History        Family History  Problem Relation Age of Onset  . Hyperlipidemia Father   . Epilepsy Brother   . ADD / ADHD Brother   . Breast cancer Paternal Grandmother 35       Passed away at 46yo  . Asthma Mother     Patient Active Problem List   Diagnosis Date Noted  . Immunosuppression due to drug therapy ([place]) 05/17/2020  . Morbid obesity with BMI of 40.0-44.9, adult ([place]) 07/14/2019  . 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 Vitamin D deficiency 03/06/2018  . Cerebral palsy ([place]) 12/24/2017  . Epilepsy ([place]) 12/24/2017  . Hidradenitis suppurativa 12/24/2017  . 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 abscess surgery Bilateral 2006   armpits and groin.12 surgeries overall.   Marland Kitchen AXILLARY HIDRADENITIS EXCISION     MULTIPLE HYDRADENITIS SURGERIES IN [place]  . hidradenitis groin Left 04/2017   last surgery done on groin and the symptoms have returned  . HYDRADENITIS EXCISION N/A 01/16/2018   Procedure: EXCISION HIDRADENITIS GROIN;  Surgeon: Jules Husbands, MD;  Location: [facility] ORS;  Service: General;  Laterality: N/A;  . KNEE SURGERY Left 2010  tendon and meniscus repair  . NOSE SURGERY  1998   deviated septum and sinus repair  . ROBOTIC ASSISTED LAPAROSCOPIC CHOLECYSTECTOMY N/A 07/24/2018   Procedure: ROBOTIC ASSISTED LAPAROSCOPIC CHOLECYSTECTOMY;  Surgeon: Jules Husbands, MD;  Location: [facility] ORS;  Service: General;  Laterality: N/A;  . TONSILLECTOMY    . TYMPANOSTOMY TUBE PLACEMENT     as a kid  . VAGUS NERVE STIMULATOR GENERATOR CHANGE  03/2015     Current Outpatient Medications:  .  acetaminophen (TYLENOL) 500 MG tablet, Take 1,000 mg by mouth every 6 (six) hours as needed  for moderate pain or headache., Disp: , Rfl:  .  Adalimumab (HUMIRA PEN-CD/UC/HS STARTER) 80 MG/0.8ML PNKT, Inject the contents of 1 pen (8m) under the skin once weekly., Disp: , Rfl:  .  ascorbic acid (VITAMIN C) 1000 MG tablet, Take by mouth., Disp: , Rfl:  .  aspirin-acetaminophen-caffeine (EXCEDRIN MIGRAINE) 2829-562-13MG tablet, Take 2 tablets by mouth every 6 (six) hours as needed for headache or migraine., Disp: , Rfl:  .  Brivaracetam 50 MG TABS, Take 50 mg by mouth daily., Disp: , Rfl:  .  Cholecalciferol (D3-50) 1.25 MG (50000 UT) capsule, Take by mouth., Disp: , Rfl:  .  clindamycin (CLINDAGEL) 1 % gel, Apply 1 application topically daily as needed (flare up)., Disp: , Rfl:  .  ergocalciferol (VITAMIN D2) 1.25 MG (50000 UT) capsule, Take 50,000 Units by mouth once a week. Sundays, Disp: , Rfl:  .  felbamate (FELBATOL) 600 MG tablet, Take 600 mg by mouth 3 (three) times daily., Disp: , Rfl:  .  magnesium oxide (MAG-OX) 400 MG tablet, Take by mouth., Disp: , Rfl:  .  melatonin 5 MG TABS, Take by mouth., Disp: , Rfl:  .  naproxen sodium (ALEVE) 220 MG tablet, Take 440 mg by mouth at bedtime as needed (for pain or headache)., Disp: , Rfl:  .  omeprazole (PRILOSEC) 20 MG capsule, TAKE 1 CAPSULE (20 MG TOTAL) BY MOUTH AT BEDTIME., Disp: 90 capsule, Rfl: 1 .  spironolactone (ALDACTONE) 100 MG tablet, Take by mouth., Disp: , Rfl:  .  TURMERIC CURCUMIN PO, Take 2 capsules by mouth daily., Disp: , Rfl:  .  Turmeric, Curcuma [place], (CURCUMIN) POWD, Take by mouth., Disp: , Rfl:  .  desoximetasone (TOPICORT) 0.25 % cream, Apply 1 application topically 2 (two) times daily., Disp: 30 g, Rfl: 0 .  diazepam (VALIUM) 5 MG tablet, Take 5 mg by mouth at bedtime as needed (for migraine). , Disp: , Rfl:  .  doxycycline (VIBRAMYCIN) 100 MG capsule, Take 1 capsule (100 mg total) by mouth 2 (two) times daily., Disp: 14 capsule, Rfl: 0 .  metoCLOPramide (REGLAN) 10 MG tablet, Take 1 tablet (10 mg total) by  mouth daily as needed (for migraine)., Disp: 20 tablet, Rfl: 0 .  nystatin cream (MYCOSTATIN), Apply 1 application topically 2 (two) times daily. (Patient taking differently: Apply 1 application topically 2 (two) times daily as needed for dry skin. ), Disp: 30 g, Rfl: 0 .  predniSONE (DELTASONE) 10 MG tablet, Take 1 tablet (10 mg total) by mouth daily with breakfast., Disp: 5 tablet, Rfl: 0 .  sodium chloride (OCEAN) 0.65 % SOLN nasal spray, Place 1 spray into both nostrils as needed for congestion., Disp: , Rfl:  .  triamcinolone (NASACORT) 55 MCG/ACT AERO nasal inhaler, Place 2 sprays into the nose daily., Disp: 1 Inhaler, Rfl: 12  Allergies  Allergen Reactions  . Codeine Anaphylaxis  . Penicillins Anaphylaxis  and Other (See Comments)    Has patient had a PCN reaction causing immediate rash, facial/tongue/throat swelling, SOB or lightheadedness with hypotension: Yes Has patient had a PCN reaction causing severe rash involving mucus membranes or skin necrosis: No Has patient had a PCN reaction that required hospitalization: Yes Has patient had a PCN reaction occurring within the last 10 years: No If all of the above answers are "NO", then may proceed with Cephalosporin use.  Has patient had a PCN reaction causing immediate rash, facial/tongue/throat swelling, SOB or lightheadedness with hypotension: Yes Has patient had a PCN reaction causing severe rash involving mucus membranes or skin necrosis: No Has patient had a PCN reaction that required hospitalization: Yes Has patient had a PCN reaction occurring within the last 10 years: No If all of the above answers are "NO", then may proceed with Cephalosporin use. Has patient had a PCN reaction causing immediate rash, facial/tongue/throat swelling, SOB or lightheadedness with hypotension: Yes Has patient had a PCN reaction causing severe rash involving mucus membranes or skin necrosis: No Has patient had a PCN reaction that required  hospitalization: Yes Has patient had a PCN reaction occurring within the last 10 years: No If all of the above answers are "NO", then may proceed with Cephalosporin use.  . Sulfa Antibiotics Hives  . Tape Other (See Comments)    Large Boils-PAPER TAPE OK TO USE Large Boils-PAPER TAPE OK TO USE    Patient Care Team: Hubbard Hartshorn, FNP as PCP - General (Family Medicine)  Review of Systems  10 Systems reviewed and are negative for acute change except as noted in the HPI.    I personally reviewed active problem list, medication list, allergies, family history, social history, health maintenance, notes from last encounter, lab results, imaging with the patient/caregiver today.        Objective:   Vitals:  Vitals:   05/17/20 0849  BP: 122/84  Pulse: 87  Resp: 16  Temp: 98.1 F (36.7 C)  TempSrc: Oral  SpO2: 99%  Weight: 242 lb 12.8 oz (110.1 kg)  Height: '5\' 4"'  (1.626 m)    Body mass index is 41.68 kg/m.  Physical Exam Vitals and nursing note reviewed.  Constitutional:      General: She is not in acute distress.    Appearance: Normal appearance. She is well-developed. She is obese. She is not ill-appearing, toxic-appearing or diaphoretic.     Interventions: Face mask in place.  HENT:     Head: Normocephalic and atraumatic.     Right Ear: Hearing, ear canal and external ear normal. No drainage, swelling or tenderness. There is no impacted cerumen. No mastoid tenderness. Tympanic membrane is not perforated or bulging.     Left Ear: Hearing, ear canal and external ear normal. No drainage, swelling or tenderness. There is no impacted cerumen. No mastoid tenderness. Tympanic membrane is not perforated or bulging.     Ears:     Comments: Right TM slightly dull, no effusion, no erythema Left TM translucent with serous effusion and scant erythema    Nose: Mucosal edema and congestion present.     Right Turbinates: Swollen.     Left Turbinates: Swollen.     Right Sinus: No  maxillary sinus tenderness or frontal sinus tenderness.     Left Sinus: No maxillary sinus tenderness or frontal sinus tenderness.     Comments: Erythematous nasal mucosa Eyes:     General: Lids are normal. No scleral icterus.  Right eye: No discharge.        Left eye: No discharge.     Conjunctiva/sclera: Conjunctivae normal.  Neck:     Trachea: Phonation normal. No tracheal deviation.  Cardiovascular:     Rate and Rhythm: Normal rate and regular rhythm.     Pulses: Normal pulses.          Radial pulses are 2+ on the right side and 2+ on the left side.       Posterior tibial pulses are 2+ on the right side and 2+ on the left side.     Heart sounds: Normal heart sounds. No murmur heard.  No friction rub. No gallop.   Pulmonary:     Effort: Pulmonary effort is normal. No respiratory distress.     Breath sounds: Normal breath sounds. No stridor. No wheezing, rhonchi or rales.  Chest:     Chest wall: No tenderness.  Abdominal:     General: Bowel sounds are normal. There is no distension.     Palpations: Abdomen is soft.  Musculoskeletal:     Right lower leg: No edema.     Left lower leg: No edema.  Skin:    General: Skin is warm and dry.     Coloration: Skin is not jaundiced or pale.     Findings: No rash.  Neurological:     Mental Status: She is alert.     Motor: No abnormal muscle tone.     Gait: Gait normal.  Psychiatric:        Mood and Affect: Mood normal.        Speech: Speech normal.        Behavior: Behavior normal.       Fall Risk: Fall Risk  05/17/2020 07/14/2019 04/10/2019 01/20/2019 08/11/2018  Falls in the past year? 0 0 0 0 0  Number falls in past yr: 0 0 0 0 -  Injury with Fall? 0 0 0 0 -  Follow up Falls evaluation completed Falls evaluation completed Falls evaluation completed Falls evaluation completed -    Functional Status Survey: Is the patient deaf or have difficulty hearing?: No Does the patient have difficulty seeing, even when wearing  glasses/contacts?: No Does the patient have difficulty concentrating, remembering, or making decisions?: No Does the patient have difficulty walking or climbing stairs?: Yes Does the patient have difficulty dressing or bathing?: No Does the patient have difficulty doing errands alone such as visiting a doctor's office or shopping?: No   Assessment & Plan:    CPE completed today  . USPSTF grade A and B recommendations reviewed with patient; age-appropriate recommendations, preventive care, screening tests, etc discussed and encouraged; healthy living encouraged; see AVS for patient education given to patient  . Discussed importance of 150 minutes of physical activity weekly, AHA exercise recommendations given to pt in AVS/handout  . Discussed importance of healthy diet:  eating lean meats and proteins, avoiding trans fats and saturated fats, avoid simple sugars and excessive carbs in diet, eat 6 servings of fruit/vegetables daily and drink plenty of water and avoid sweet beverages.    . Recommended pt to do annual eye exam and routine dental exams/cleanings  . Depression, alcohol, fall screening completed as documented above and per flowsheets  . Reviewed Health Maintenance: Health Maintenance  Topic Date Due  . PAP SMEAR-Modifier  Never done  . TETANUS/TDAP  03/06/2028  . INFLUENZA VACCINE  Completed  . COVID-19 Vaccine  Completed  . Hepatitis C Screening  Completed  . HIV Screening  Completed    . Immunizations: Immunization History  Administered Date(s) Administered  . Influenza,inj,Quad PF,6+ Mos 04/24/2017, 03/06/2018, 03/18/2019  . Influenza-Unspecified 03/11/2018, 04/18/2020  . Moderna SARS-COVID-2 Vaccination 09/22/2019, 10/20/2019  . Tdap 03/06/2018    Nasal spray Antihistamine B6 supplement - add to med list    ICD-10-CM   1. Adult general medical exam  Z00.00 Lipid panel    COMPLETE METABOLIC PANEL WITH GFR    CBC with Differential/Platelet    VITAMIN D 25  Hydroxy (Vit-D Deficiency, Fractures)  2. Gastroesophageal reflux disease without esophagitis  K21.9 omeprazole (PRILOSEC) 20 MG capsule   omeprazole daily - very symptomatic w/o meds  3. Screening for malignant neoplasm of cervix  Z12.4   4. Encounter for screening mammogram for malignant neoplasm of breast  Z12.31 MM 3D SCREEN BREAST BILATERAL  5. Morbid obesity with BMI of 40.0-44.9, adult ([place])  E66.01    Z68.41   6. Hyperlipidemia, unspecified hyperlipidemia type  E78.5 Lipid panel    COMPLETE METABOLIC PANEL WITH GFR   very high, family history of same, not on meds  7. Vitamin D deficiency  W44.6 COMPLETE METABOLIC PANEL WITH GFR    VITAMIN D 25 Hydroxy (Vit-D Deficiency, Fractures)   on high dose OTC- recheck and take over Rx weekly   8. Cerebral palsy, unspecified type ([place])  G80.9    weakness in LE, L>R, pain to feet and up calves, was working with PT, per [facility] neurology specialists  9. Nonintractable epilepsy without status epilepticus, unspecified epilepsy type ([place])  G40.909    per [facility] - no recent seizures  10. Hidradenitis suppurativa  L73.2    per [facility] dermatology, on humira and spironolactone  11. Status post VNS (vagus nerve stimulator) placement  Z96.89    for seizure management - per [facility] specialists  12. Immunosuppression due to drug therapy [facility])  K86.381    Z79.899    on humira and seizure meds - asks for pneumococcal shot today - agree its appropriate, monitor, tx sinusitis early with abx  13. Thrombocytosis  D75.839    hx of, has been to specialists before, monitor platelets  14. Chronic rhinosinusitis  J31.0    J32.9    get back on meds - nasal spray and 2nd generation antihistamine - per insurance coverage, recurrent infection, tx more aggressively for pt on humira   Refer possibly next year to GYN for cervical CA screening - due - never sexually active sounds like vaginismus or very tight introitus - past traumatic pelvic exam - will try to get pt to  est with GYN over the next 6 months, currently no sx or concerns     Delsa Grana, PA-C 05/17/20 9:10 AM  [facility]

## 2020-05-18 LAB — CBC WITH DIFFERENTIAL/PLATELET
Absolute Monocytes: 812 cells/uL (ref 200–950)
Basophils Absolute: 105 cells/uL (ref 0–200)
Basophils Relative: 0.8 %
Eosinophils Absolute: 275 cells/uL (ref 15–500)
HCT: 40.1 % (ref 35.0–45.0)
Hemoglobin: 13.4 g/dL (ref 11.7–15.5)
Lymphs Abs: 3956 cells/uL — ABNORMAL HIGH (ref 850–3900)
MCH: 30.2 pg (ref 27.0–33.0)
MCHC: 33.4 g/dL (ref 32.0–36.0)
MCV: 90.5 fL (ref 80.0–100.0)
MPV: 9.3 fL (ref 7.5–12.5)
Monocytes Relative: 6.2 %
Neutro Abs: 7952 cells/uL — ABNORMAL HIGH (ref 1500–7800)
Platelets: 538 10*3/uL — ABNORMAL HIGH (ref 140–400)
RBC: 4.43 10*6/uL (ref 3.80–5.10)
RDW: 11.8 % (ref 11.0–15.0)
Total Lymphocyte: 30.2 %
WBC: 13.1 10*3/uL — ABNORMAL HIGH (ref 3.8–10.8)

## 2020-05-18 LAB — COMPLETE METABOLIC PANEL WITH GFR
AG Ratio: 1.4 (calc) (ref 1.0–2.5)
ALT: 17 U/L (ref 6–29)
AST: 15 U/L (ref 10–30)
Albumin: 4.2 g/dL (ref 3.6–5.1)
Alkaline phosphatase (APISO): 85 U/L (ref 31–125)
BUN: 13 mg/dL (ref 7–25)
CO2: 26 mmol/L (ref 20–32)
Calcium: 11.1 mg/dL — ABNORMAL HIGH (ref 8.6–10.2)
Chloride: 102 mmol/L (ref 98–110)
Creat: 0.67 mg/dL (ref 0.50–1.10)
GFR, Est African American: 127 mL/min/{1.73_m2} (ref >=60)
GFR, Est Non African American: 110 mL/min/{1.73_m2} (ref >=60)
Globulin: 3 g/dL (calc) (ref 1.9–3.7)
Glucose, Bld: 83 mg/dL (ref 65–99)
Potassium: 4.6 mmol/L (ref 3.5–5.3)
Sodium: 139 mmol/L (ref 135–146)
Total Bilirubin: 0.2 mg/dL (ref 0.2–1.2)
Total Protein: 7.2 g/dL (ref 6.1–8.1)

## 2020-05-18 LAB — VITAMIN D 25 HYDROXY (VIT D DEFICIENCY, FRACTURES): Vit D, 25-Hydroxy: 61 ng/mL (ref 30–100)

## 2020-05-18 LAB — LIPID PANEL
Cholesterol: 218 mg/dL — ABNORMAL HIGH (ref <200)
HDL: 54 mg/dL (ref >=50)
LDL Cholesterol (Calc): 141 mg/dL (calc) — ABNORMAL HIGH
Non-HDL Cholesterol (Calc): 164 mg/dL (calc) — ABNORMAL HIGH (ref <130)
Total CHOL/HDL Ratio: 4 (calc) (ref <5.0)
Triglycerides: 110 mg/dL (ref <150)

## 2020-05-21 IMAGING — US US ABDOMEN LIMITED
1 series · 14 of 25 positions shown · non-contrast
Comparison: None.

CLINICAL DATA: Recurrent right upper quadrant pain

EXAM:
ULTRASOUND ABDOMEN LIMITED RIGHT UPPER QUADRANT

[Series 1: us abdomen limited · 0.23mm/px · 14 of 70 slices shown]
[im 1/70]
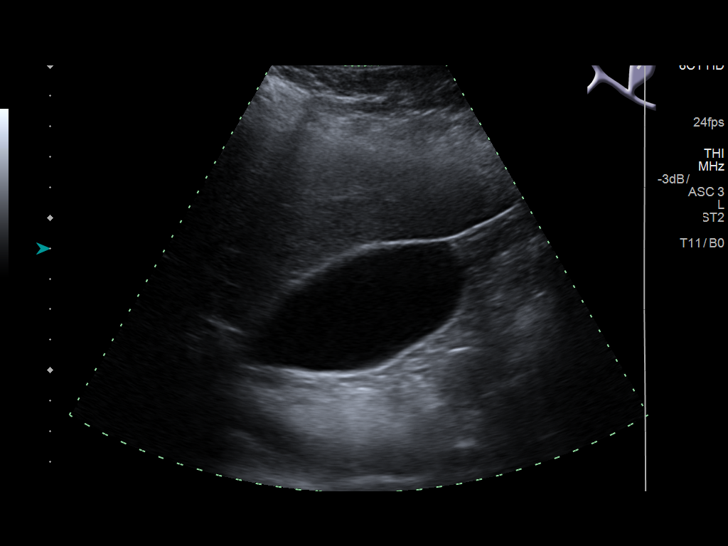
[im 6/70]
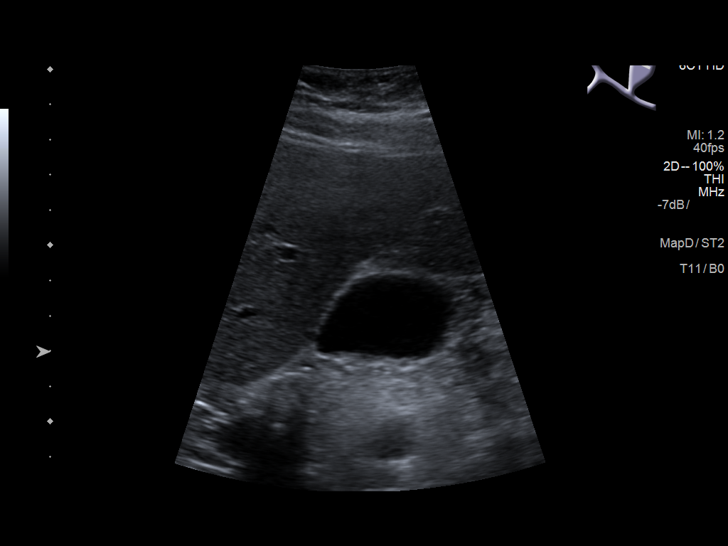
[im 12/70]
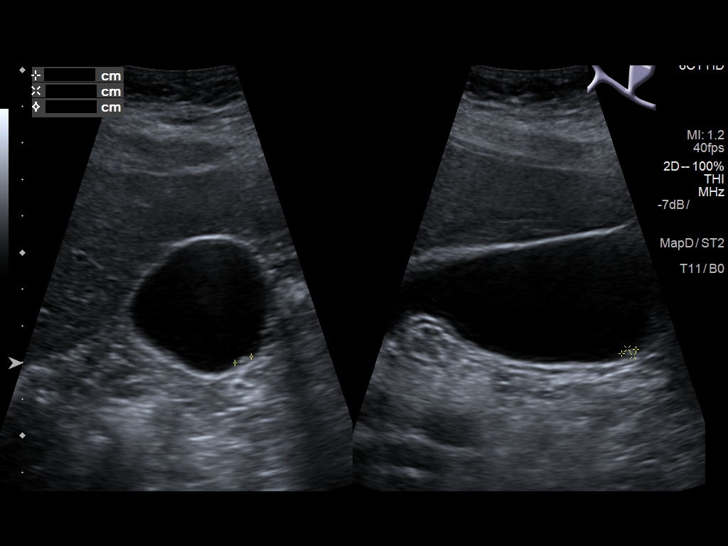
[im 18/70]
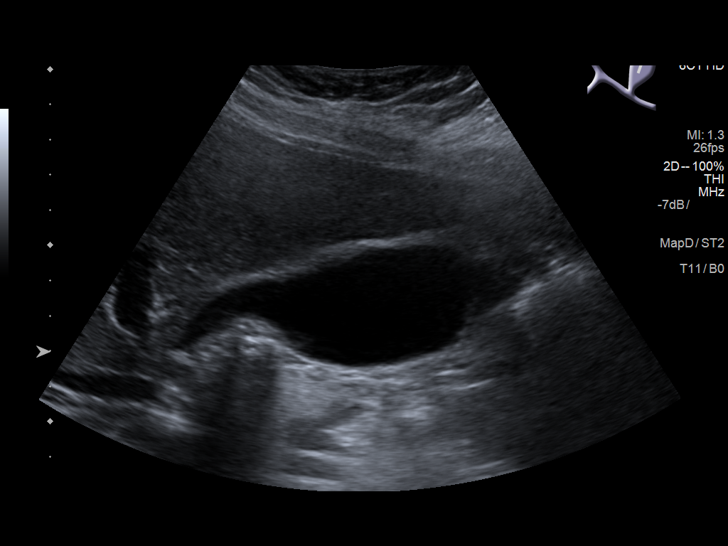
[im 24/70]
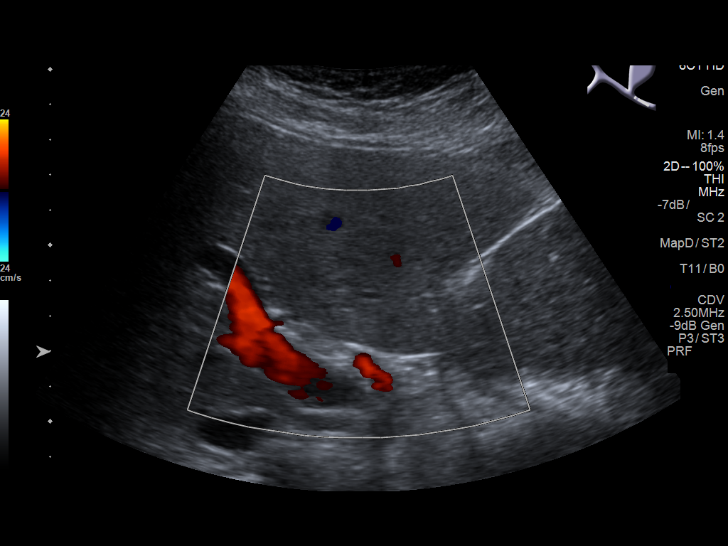
[im 26/70]
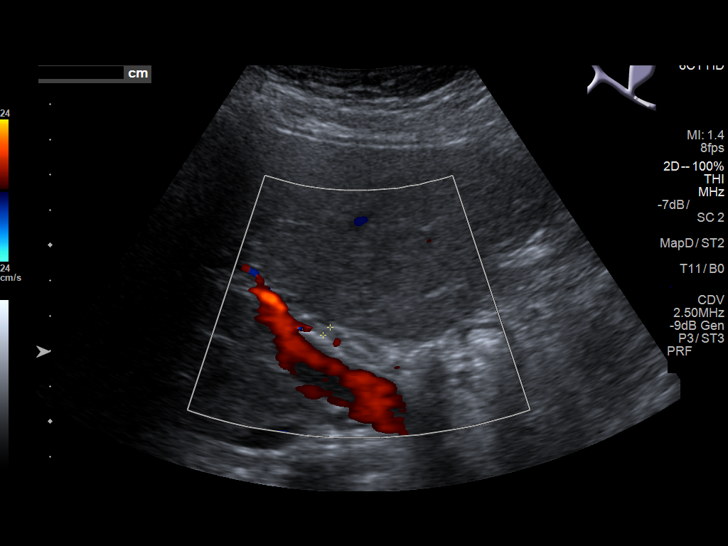
[im 32/70]
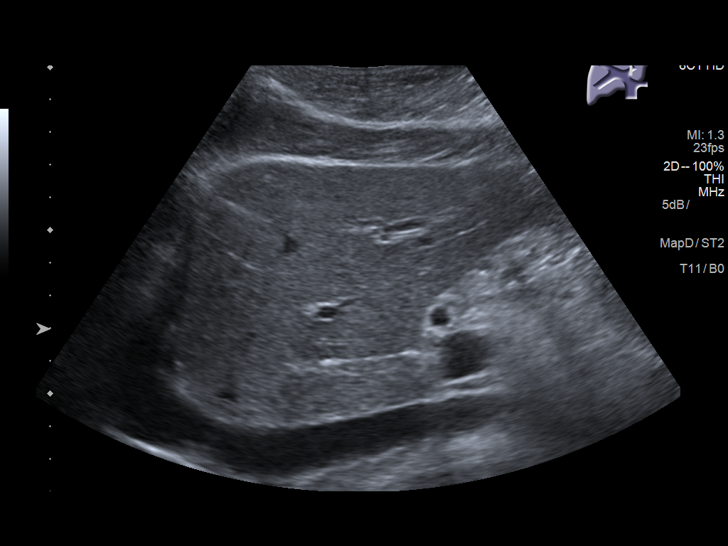
[im 38/70]
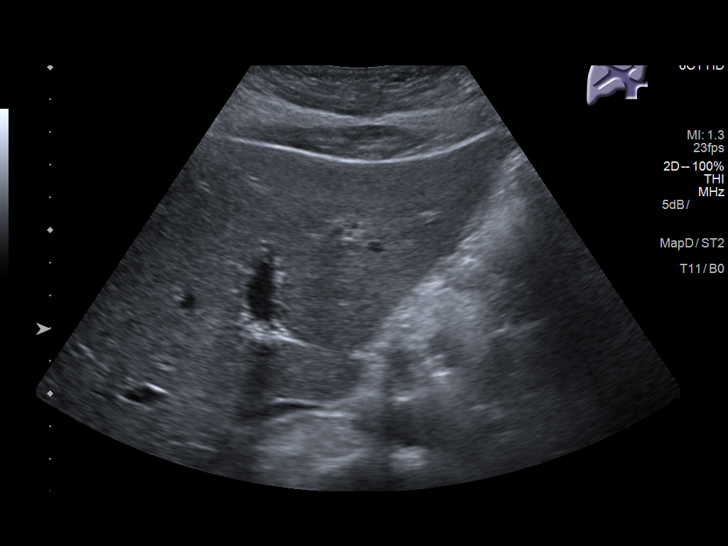
[im 44/70]
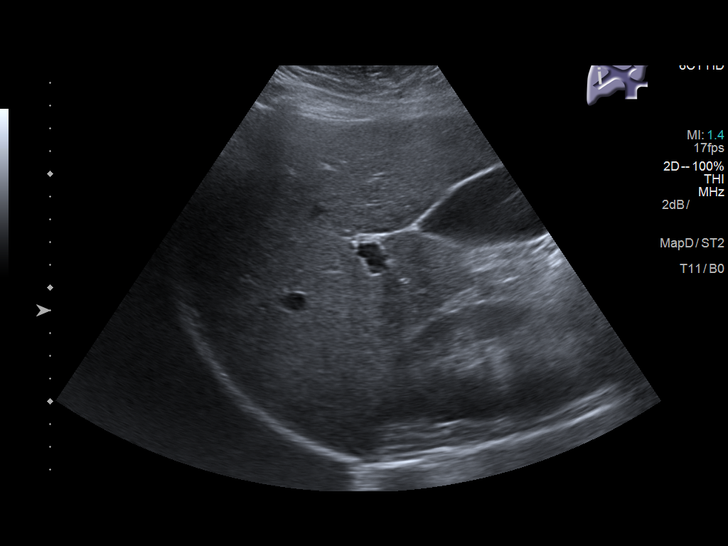
[im 47/70]
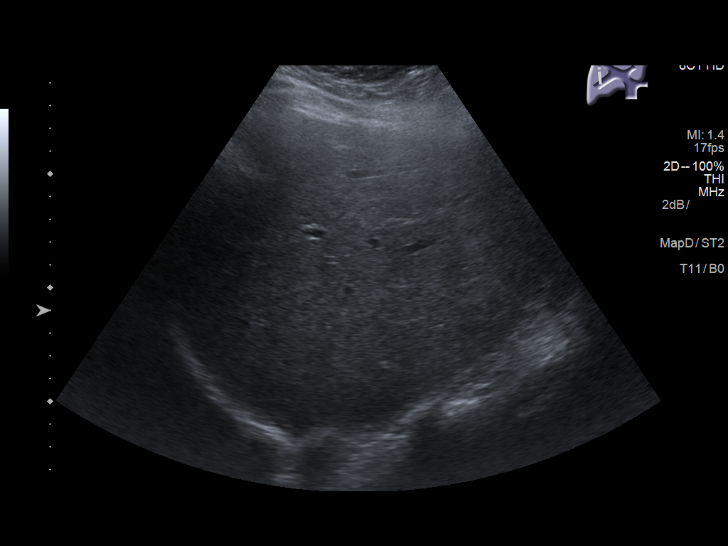
[im 52/70]
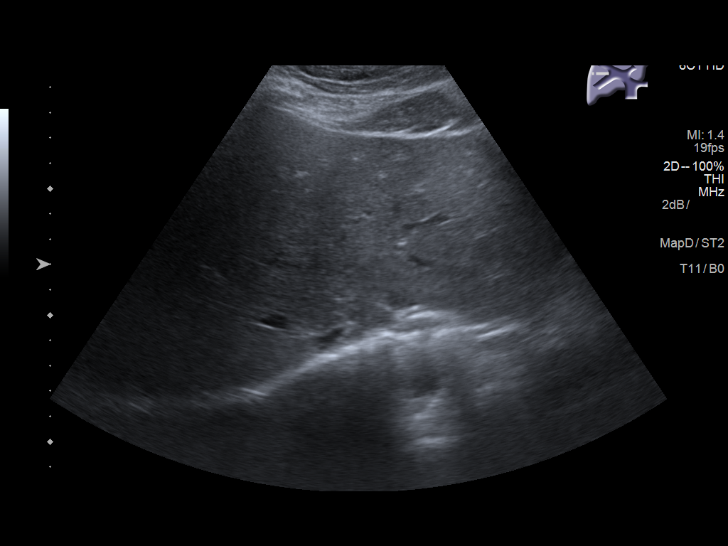
[im 58/70]
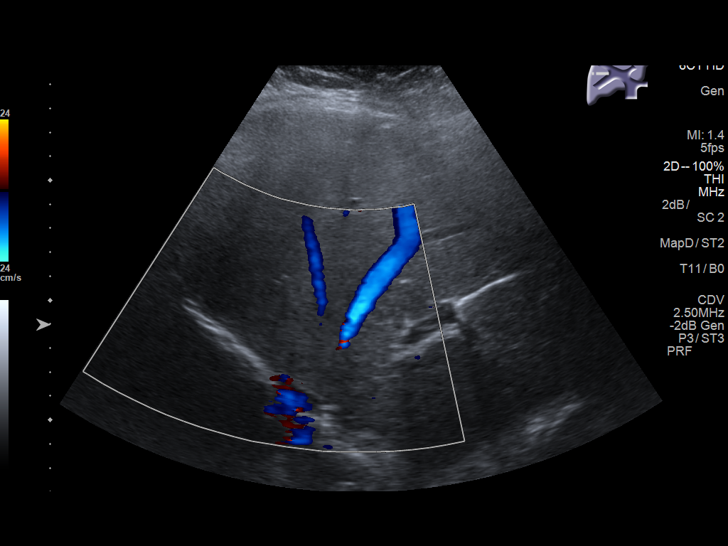
[im 64/70]
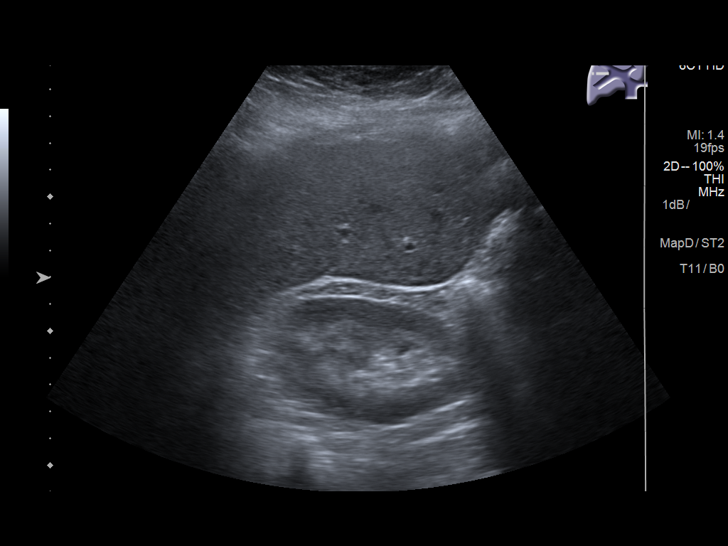
[im 70/70]
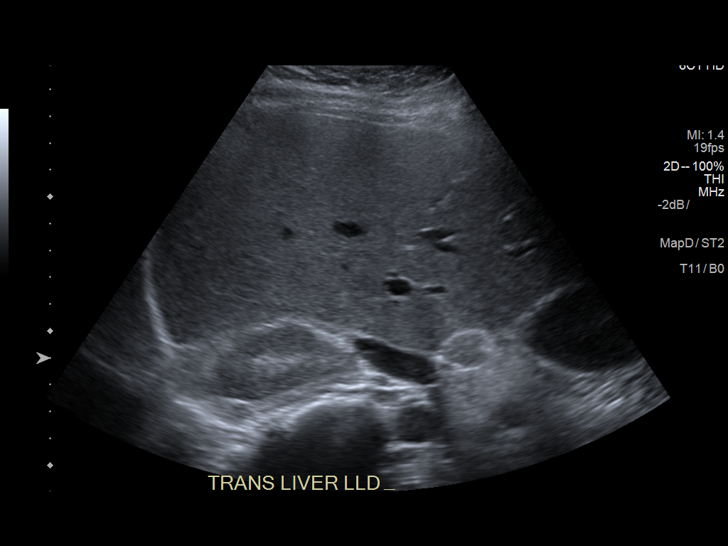

[14 of 25 positions shown; findings below may reference images not displayed]

FINDINGS: Gallbladder:

Tiny polyp measuring 4 mm. Negative for shadowing stone. Negative
sonographic [place]. Normal wall thickness.

Common bile duct:

Diameter: 3 mm

Liver:

No focal lesion identified. Within normal limits in parenchymal
echogenicity. Portal vein is patent on color Doppler imaging with
normal direction of blood flow towards the liver.
IMPRESSION: 1. Tiny gallbladder polyp. Negative for cholelithiasis or biliary
dilatation.

## 2020-05-25 ENCOUNTER — Other Ambulatory Visit: Payer: Self-pay | Admitting: Family Medicine

## 2020-05-25 DIAGNOSIS — D75839 Thrombocytosis, unspecified: Secondary | ICD-10-CM

## 2020-05-25 DIAGNOSIS — E559 Vitamin D deficiency, unspecified: Secondary | ICD-10-CM

## 2020-05-25 DIAGNOSIS — D72829 Elevated white blood cell count, unspecified: Secondary | ICD-10-CM

## 2020-05-25 NOTE — Unmapped (Signed)
[facility] Refill Coordination Note    Specialty Medication(s) to be Shipped:   Inflammatory Disorders: Humira    Other medication(s) to be shipped: No additional medications requested for fill at this time     Erica Norman, DOB: 11-May-1981  Phone: [phone] (home)       All above HIPAA information was verified with patient.     Was a Nurse, learning disability used for this call? No    Completed refill call assessment today to schedule patient's medication shipment from the [facility] [phone]).       Specialty medication(s) and dose(s) confirmed: Regimen is correct and unchanged.   Changes to medications: [name] reports no changes at this time.  Changes to insurance: No  Questions for the pharmacist: No    Confirmed patient received Welcome Packet with first shipment. The patient will receive a drug information handout for each medication shipped and additional FDA Medication Guides as required.       DISEASE/MEDICATION-SPECIFIC INFORMATION        For patients on injectable medications: Patient currently has 2 doses left.  Next injection is scheduled for 11/29,12/06.    SPECIALTY MEDICATION ADHERENCE     Medication Adherence    Patient reported X missed doses in the last month: 1  Specialty Medication: Humira 80mg /0.34ml  Patient is on additional specialty medications: No  Patient is on more than two specialty medications: No  Any gaps in refill history greater than 2 weeks in the last 3 months: no  Informant: patient  Reliability of informant: reliable  Provider-estimated medication adherence level: good  Patient is at risk for Non-Adherence: No                  humira 80/0.8 mg/ml: 14 days of medicine on hand       SHIPPING     Shipping address confirmed in Epic.     Delivery Scheduled: Yes, Expected medication delivery date: 12/08.     Medication will be delivered via Same Day Courier to the prescription address in Epic WAM.    Antonietta Barcelona   [facility] Specialty Technician

## 2020-06-08 DIAGNOSIS — L732 Hidradenitis suppurativa: Principal | ICD-10-CM

## 2020-06-08 MED FILL — HUMIRA(CF) PEN 80 MG/0.8 ML SUBCUTANEOUS KIT: 28 days supply | Qty: 4 | Fill #2 | Status: AC

## 2020-06-08 MED FILL — HUMIRA(CF) PEN 80 MG/0.8 ML SUBCUTANEOUS KIT: 28 days supply | Qty: 4 | Fill #2

## 2020-07-05 NOTE — Unmapped (Signed)
[facility] has made a second and final attempt to reach this patient to refill the following medication:Humira.      We have left voicemails on the following phone numbers: [phone] , have been unable to leave messages on the following phone numbers: [phone] and have sent a MyChart message.    Dates contacted: 12/30 and 01/04  Last scheduled delivery: 06/08/2020    The patient may be at risk of non-compliance with this medication. The patient should call the [facility] at [phone] (option 4) to refill medication.    Eldana Isip D Administrator [facility] Specialty Technician

## 2020-07-14 ENCOUNTER — Encounter: Payer: Self-pay | Admitting: Family Medicine

## 2020-07-15 ENCOUNTER — Encounter: Payer: Self-pay | Admitting: Family Medicine

## 2020-07-15 ENCOUNTER — Telehealth (INDEPENDENT_AMBULATORY_CARE_PROVIDER_SITE_OTHER): Payer: Medicaid Other | Admitting: Family Medicine

## 2020-07-15 ENCOUNTER — Other Ambulatory Visit: Payer: Self-pay

## 2020-07-15 DIAGNOSIS — Z79899 Other long term (current) drug therapy: Secondary | ICD-10-CM

## 2020-07-15 DIAGNOSIS — E559 Vitamin D deficiency, unspecified: Secondary | ICD-10-CM

## 2020-07-15 DIAGNOSIS — D84821 Immunodeficiency due to drugs: Secondary | ICD-10-CM | POA: Diagnosis not present

## 2020-07-15 DIAGNOSIS — Z9889 Other specified postprocedural states: Secondary | ICD-10-CM | POA: Diagnosis not present

## 2020-07-15 DIAGNOSIS — J3089 Other allergic rhinitis: Secondary | ICD-10-CM | POA: Diagnosis not present

## 2020-07-15 DIAGNOSIS — J329 Chronic sinusitis, unspecified: Secondary | ICD-10-CM

## 2020-07-15 MED ORDER — FEXOFENADINE HCL 180 MG PO TABS: 180.0000 mg | ORAL_TABLET | Freq: Every day | ORAL | 1 refills | Status: DC

## 2020-07-15 MED ORDER — CEFDINIR 300 MG PO CAPS: 300.0000 mg | ORAL_CAPSULE | Freq: Two times a day (BID) | ORAL | 0 refills | Status: AC

## 2020-07-15 NOTE — Progress Notes (Signed)
Name: Destiny Flores   MRN: [id number]    DOB: [birth year]   Date:07/15/2020       Progress Note  Subjective:   Chief Complaint  Chief Complaint  Patient presents with  . Sinusitis    Congested, drainage for 2 weeks. Covid test on 01/11 was negative    I connected with  [name]  on 07/15/20 at 10:20 AM EST by a video enabled telemedicine application and verified that I am speaking with the correct person using two identifiers.  I discussed the limitations of evaluation and management by telemedicine and the availability of in person appointments. The patient expressed understanding and agreed to proceed. Staff also discussed with the patient that there may be a patient responsible charge related to this service. Patient Location: home Provider Location: [facility] Additional Individuals present: none  HPI Pt sent mychart msg yesterday regarding sx - copied below, was asked  Tested on 07/12/2020   Hi, I hope your holidays were good and you are doing well. I think I may need to schedule an appointment or something, I been having signs of a sinus infection congested head, drainage where I am coughing stuff up in the mornings and evenings. My ears hurt off and on I think it's the congestion. I wake up every morning with a headache. No fever at all. I took a covid test (since I had the symptoms for awhile and wanted to rule it out before I bothered you) and  it was negative (that was on Tuesday)  My dad did it on me I try to have him do it so I know it's done right since he is PA and does it for the [facility] hospital. I will do what you think is best. I have tried for a couple of weeks to let it runs its course but it's not getting better and it's not getting worse. Thank you for your time.   [name]  She feels like allergies and trigger from animals in her home cause the start of her sx 2-3 weeks ago, covid test (doesn't know which kind of test was done) but it was neg, she endorses  sinus/facial pain and pressure, gradually worsening, recurrent infections similar.  No recent abx since last OV (Nov) but prior to Nov she notes nearly monthly UC visits and Abx for several months since about summertime  She would like to est with local ENT -  Prior ENT hx include: Deviated septum surgery Sinuses cleaned out - in 1990's Long hx seasonal and environmental/animal allergies - past allergy testing many years ago - she cannot get records from [place] She believes she had prior allergy testing but she does not recall doing any allergy shots On immunosuppressive drugs - Humira - causes increased risk of sinusitis  She denies any fever, headache, sore throat, cough, body aches, shortness of breath  F/up on hypercalcemia -patient had elevated calcium with her last labs she at that time had been taking high-dose vitamin D which she ordered online she is not on any other vitamins or supplements she has not changed anything else in her diet but has decreased the vitamin D.  She wants note that anything up she needs to do about this.  She is due to follow-up with [place] at her next routine office visit  Patient Active Problem List   Diagnosis Date Noted  . Immunosuppression due to drug therapy (HCC) 05/17/2020  . Thrombocytosis 05/17/2020  . Hyperlipidemia 05/17/2020  . Gastroesophageal  reflux disease without esophagitis 05/17/2020  . Chronic rhinosinusitis 05/17/2020  . Morbid obesity with BMI of 40.0-44.9, adult (HCC) 07/14/2019  . 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 Vitamin D deficiency 03/06/2018  . Cerebral palsy (HCC) 12/24/2017  . Epilepsy (HCC) 12/24/2017  . Hidradenitis suppurativa 12/24/2017  . 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 Smoking status: Never Smoker  . Smokeless tobacco: Never Used  Substance Use Topics  . Alcohol use: Never     Current Outpatient Medications:  .  acetaminophen (TYLENOL)  500 MG tablet, Take 1,000 mg by mouth every 6 (six) hours as needed for moderate pain or headache., Disp: , Rfl:  .  Adalimumab (HUMIRA PEN-CD/UC/HS STARTER) 80 MG/0.8ML PNKT, Inject the contents of 1 pen (80mg ) under the skin once weekly., Disp: , Rfl:  .  ascorbic acid (VITAMIN C) 1000 MG tablet, Take by mouth., Disp: , Rfl:  .  aspirin-acetaminophen-caffeine (EXCEDRIN MIGRAINE) 250-250-65 MG tablet, Take 2 tablets by mouth every 6 (six) hours as needed for headache or migraine., Disp: , Rfl:  .  Brivaracetam 50 MG TABS, Take 50 mg by mouth daily., Disp: , Rfl:  .  clindamycin (CLINDAGEL) 1 % gel, Apply 1 application topically daily as needed (flare up)., Disp: , Rfl:  .  felbamate (FELBATOL) 600 MG tablet, Take 600 mg by mouth 3 (three) times daily., Disp: , Rfl:  .  ipratropium (ATROVENT) 0.03 % nasal spray, Place 2 sprays into both nostrils every 12 (twelve) hours., Disp: 30 mL, Rfl: 12 .  loratadine (CLARITIN) 10 MG tablet, Take 1 tablet (10 mg total) by mouth daily., Disp: 90 tablet, Rfl: 3 .  magnesium oxide (MAG-OX) 400 MG tablet, Take by mouth., Disp: , Rfl:  .  melatonin 5 MG TABS, Take by mouth., Disp: , Rfl:  .  naproxen sodium (ALEVE) 220 MG tablet, Take 440 mg by mouth at bedtime as needed (for pain or headache)., Disp: , Rfl:  .  omeprazole (PRILOSEC) 20 MG capsule, Take 1 capsule (20 mg total) by mouth daily., Disp: 90 capsule, Rfl: 3 .  spironolactone (ALDACTONE) 100 MG tablet, Take by mouth., Disp: , Rfl:  .  TURMERIC CURCUMIN PO, Take 2 capsules by mouth daily., Disp: , Rfl:   Allergies  Allergen Reactions  . Codeine Anaphylaxis  . Penicillins Anaphylaxis and Other (See Comments)    Has patient had a PCN reaction causing immediate rash, facial/tongue/throat swelling, SOB or lightheadedness with hypotension: Yes Has patient had a PCN reaction causing severe rash involving mucus membranes or skin necrosis: No Has patient had a PCN reaction that required hospitalization:  Yes Has patient had a PCN reaction occurring within the last 10 years: No If all of the above answers are "NO", then may proceed with Cephalosporin use.  Has patient had a PCN reaction causing immediate rash, facial/tongue/throat swelling, SOB or lightheadedness with hypotension: Yes Has patient had a PCN reaction causing severe rash involving mucus membranes or skin necrosis: No Has patient had a PCN reaction that required hospitalization: Yes Has patient had a PCN reaction occurring within the last 10 years: No If all of the above answers are "NO", then may proceed with Cephalosporin use. Has patient had a PCN reaction causing immediate rash, facial/tongue/throat swelling, SOB or lightheadedness with hypotension: Yes Has patient had a PCN reaction causing severe rash involving mucus membranes or skin necrosis: No Has patient had a PCN reaction that required hospitalization:  Yes Has patient had a PCN reaction occurring within the last 10 years: No If all of the above answers are "NO", then may proceed with Cephalosporin use.  . Sulfa Antibiotics Hives  . Tape Other (See Comments)    Large Boils-PAPER TAPE OK TO USE Large Boils-PAPER TAPE OK TO USE    I personally reviewed active problem list, medication list, allergies, family history, social history, health maintenance, notes from last encounter, lab results, imaging with the patient/caregiver today.   Review of Systems  Constitutional: Negative.   HENT: Negative.   Eyes: Negative.   Respiratory: Negative.   Cardiovascular: Negative.   Gastrointestinal: Negative.   Endocrine: Negative.   Genitourinary: Negative.   Musculoskeletal: Negative.   Skin: Negative.   Allergic/Immunologic: Negative.   Neurological: Negative.   Hematological: Negative.   Psychiatric/Behavioral: Negative.   All other systems reviewed and are negative.     Objective:   Virtual encounter, vitals limited, only able to obtain the following There were  no vitals filed for this visit. There is no height or weight on file to calculate BMI. Nursing Note and Vital Signs reviewed.  Physical Exam Vitals and nursing note reviewed.  Constitutional:      Appearance: She is well-developed and well-groomed. She is obese. She is not ill-appearing, toxic-appearing or diaphoretic.  Pulmonary:     Effort: No respiratory distress.  Neurological:     Mental Status: She is alert.  Psychiatric:        Behavior: Behavior is cooperative.     PE limited by telephone encounter  No results found for this or any previous visit (from the past 72 hour(s)).  Assessment and Plan:     ICD-10-CM   1. Recurrent rhinosinusitis  J32.9 cefdinir (OMNICEF) 300 MG capsule    fexofenadine (ALLEGRA) 180 MG tablet    Ambulatory referral to ENT   Patient has waited 2 to 3 weeks, negative for COVID, higher risk for bacterial sinusitis treat with antibiotics 2 to 3 weeks  2. History of nasal septoplasty  Z98.890 Ambulatory referral to ENT  3. Environmental and seasonal allergies  J30.89 Ambulatory referral to ENT  4. Immunosuppression due to drug therapy [facility])  F68.127 Ambulatory referral to ENT   Z79.899   5. Hypercalcemia  E83.52    Reviewed her past labs, she has decreased her vitamin D supplement dose and labs will be rechecked at her next routine office visit  6. Vitamin D deficiency  E55.9    Encouraged to do a lower over-the-counter dose, will f/up on labs at next OV     -Red flags and when to present for emergency care or RTC including fever >101.43F, chest pain, shortness of breath, new/worsening/un-resolving symptoms, reviewed with patient at time of visit. Follow up and care instructions discussed and provided in AVS. - I discussed the assessment and treatment plan with the patient. The patient was provided an opportunity to ask questions and all were answered. The patient agreed with the plan and demonstrated an understanding of the instructions.  I  provided 25+ minutes of non-face-to-face time during this encounter.  Danelle Berry, PA-C 07/15/20 10:39 AM

## 2020-07-20 DIAGNOSIS — J301 Allergic rhinitis due to pollen: Secondary | ICD-10-CM | POA: Diagnosis not present

## 2020-07-20 DIAGNOSIS — J329 Chronic sinusitis, unspecified: Secondary | ICD-10-CM | POA: Diagnosis not present

## 2020-07-25 DIAGNOSIS — G43909 Migraine, unspecified, not intractable, without status migrainosus: Secondary | ICD-10-CM | POA: Diagnosis not present

## 2020-07-25 DIAGNOSIS — Z9689 Presence of other specified functional implants: Secondary | ICD-10-CM | POA: Diagnosis not present

## 2020-07-25 DIAGNOSIS — G40011 Localization-related (focal) (partial) idiopathic epilepsy and epileptic syndromes with seizures of localized onset, intractable, with status epilepticus: Secondary | ICD-10-CM | POA: Diagnosis not present

## 2020-07-25 DIAGNOSIS — R6250 Unspecified lack of expected normal physiological development in childhood: Secondary | ICD-10-CM | POA: Diagnosis not present

## 2020-08-04 ENCOUNTER — Ambulatory Visit
Admission: RE | Admit: 2020-08-04 | Discharge: 2020-08-04 | Disposition: A | Discharge status: Home / Self Care | Payer: Medicaid Other | Source: Ambulatory Visit | Attending: Family Medicine | Admitting: Family Medicine

## 2020-08-04 ENCOUNTER — Other Ambulatory Visit: Payer: Self-pay

## 2020-08-04 DIAGNOSIS — Z1231 Encounter for screening mammogram for malignant neoplasm of breast: Secondary | ICD-10-CM | POA: Diagnosis not present

## 2020-08-04 MED ORDER — HYDROCODONE 5 MG-ACETAMINOPHEN 325 MG TABLET
ORAL_TABLET | Freq: Four times a day (QID) | ORAL | 0 refills | 2.00000 days | Status: CP | PRN
Start: 2020-08-04 — End: 2020-08-09

## 2020-08-09 ENCOUNTER — Ambulatory Visit: Admit: 2020-08-09 | Discharge: 2020-08-10 | Payer: BLUE CROSS/BLUE SHIELD

## 2020-08-09 DIAGNOSIS — L732 Hidradenitis suppurativa: Principal | ICD-10-CM

## 2020-08-09 MED ORDER — SPIRONOLACTONE 100 MG TABLET
ORAL_TABLET | Freq: Every day | ORAL | 3 refills | 90.00000 days | Status: CP
Start: 2020-08-09 — End: 2021-08-09

## 2020-08-09 MED ORDER — CLOBETASOL 0.05 % TOPICAL OINTMENT
Freq: Two times a day (BID) | TOPICAL | 1 refills | 0.00000 days | Status: CP
Start: 2020-08-09 — End: 2021-08-09

## 2020-08-09 MED ORDER — HYDROCODONE 5 MG-ACETAMINOPHEN 325 MG TABLET
ORAL_TABLET | Freq: Four times a day (QID) | ORAL | 0 refills | 2.00000 days | Status: CP | PRN
Start: 2020-08-09 — End: ?

## 2020-08-10 MED ADMIN — triamcinolone acetonide (KENALOG-40) injection 40 mg: 40 mg | INTRALESIONAL | @ 04:00:00 | Stop: 2020-08-09

## 2020-08-10 NOTE — Unmapped (Signed)

## 2020-08-10 NOTE — Unmapped (Signed)
ASSESSMENT/PLAN:  Hidradenitis Suppurativa (HS): Hurley stage 2  - discussed the chronic, relapsing nature of this skin disease, characterized by recurring inflamed painful nodules with abscess and sinus formation, and scarring  - explained to patient that usually early lesion can remit with medical treatment, but once sinus tracts are established treatment options are limited and surgery is the only definitive treatment; however, recurrence rate can be up to 25% even with surgery  - Continue Humira 80 mg weekly  - continue Spironolactone 100 mg daily.   - On reserve:  Infliximab  -add clobetasol 0.05% ointment applied to affected areas twice daily as needed.  Appropriate use and side effects discussed for spot treatment for up to 2 weeks.  -norco 5/325mg  for pain prn q6 H    After the patient was informed of risks, benefits and side effects of intralesional steroid injection, the patient elected to undergo injection. Informed verbal consent was obtained. Risk of atrophy  and dyspigmentation with injection was explained. Kenalog 40 mg/ml was injected locally into the sites located R and L buttocks in a clean fashion following alcohol prep.   Total volume in ml=0.4.  Number of sites treated: 2   Wound care was explained to the patient      RTC: 3 months      SUBJECTIVE:    CC: Hidradenitis Suppurativa    Erica Norman is a 40 y.o. female  who is seen as a retuning patient for Hidradenitis Suppurativa. Has had a few particular areas in R and L groin and R perianal area that flare intermittently, and has mostly been stable until a very painful abscess arose on the L buttock in the last week.  It drained a few days ago but is still tender and now there is a mirror-image lesion growing on the R buttock Tolerating adalimumab well at 80 mg weekly dosing (increased 06/2020).  Continues spironolactone 100mg  daily started in spring 2021 and thinks it is somewhat helpful.   Self-reported severity (0-5): 4  VAS pain today: 3  VAS average pain for the last month: 8  Requiring pain medication? Yes.  If so, what type/frequency? Aleve and Tylenol  How often in pain?  few times a day  Level of odor (0-5): 3  Level of itching (0-5): 2  Dressing changes needed for drainage:Twice a day  How much drainage: a little drainage  Flare in the last month (Y/N)? No.  How long ago was the last flare? in last 6 months  Developing new lesions? less than monthly  Number of inflammatory lesions montly: 1-3  DLQI: 14  Current treatment: Humira, Spirolactone, Vicks vapor rub and organic baby wash  How helpful is the current treatment in managing the following aspects of your disease?  Not at all helpful Somewhat helpful Very helpful   Pain x     Decreasing length of flares  x    Decreasing new lesions  x    Drainage      Decreasing frequency of flares  x    Decreasing severity of flares  x    Odor  x          ROS: the balance of 10 systems is negative unless otherwise documented      OBJECTIVE:   Gen: Well-appearing patient, appropriate, interactive, in no acute distress  Skin: Examination of the scalp, face, neck, chest, back, abdomen, bilateral upper and lower extremities, hands, palms, soles, nails, buttocks, and external genitalia performed today and pertinent for:  location Abscess Inflamed nodule Non-inflamed nodule Draining sinus Non-draining Sinus Hurley % scar   R axilla          L axilla          R inframammary          L inframammary          Intermammary          Pubic          R inguinal  1   1     R thigh          L inguinal  1   1     L thigh          Scrotum/Vulva          Perianal          R buttock 1    1     L buttock  1        Other (list)                    AN count 4

## 2020-08-12 NOTE — Unmapped (Signed)
[facility] Refill Coordination Note    Specialty Medication(s) to be Shipped:   Inflammatory Disorders: Humira    Other medication(s) to be shipped: No additional medications requested for fill at this time     Erica Norman, DOB: 08/26/1980  Phone: [phone] (home)       All above HIPAA information was verified with patient.     Was a Nurse, learning disability used for this call? No    Completed refill call assessment today to schedule patient's medication shipment from the [facility] [phone]).       Specialty medication(s) and dose(s) confirmed: Regimen is correct and unchanged.   Changes to medications: [name] reports no changes at this time.  Changes to insurance: No  Questions for the pharmacist: No    Confirmed patient received Welcome Packet with first shipment. The patient will receive a drug information handout for each medication shipped and additional FDA Medication Guides as required.       DISEASE/MEDICATION-SPECIFIC INFORMATION        For patients on injectable medications: Patient currently has 0 doses left.  Next injection is scheduled for 02/15-asap.    SPECIALTY MEDICATION ADHERENCE                humira 80/0.8 mg/ml: 0 days of medicine on hand         SHIPPING     Shipping address confirmed in Epic.     Delivery Scheduled: Yes, Expected medication delivery date: 02/15.     Medication will be delivered via Same Day Courier to the prescription address in Epic WAM.    Antonietta Barcelona   [facility] Specialty Technician

## 2020-08-16 MED FILL — HUMIRA(CF) PEN 80 MG/0.8 ML SUBCUTANEOUS KIT: 28 days supply | Qty: 4 | Fill #3

## 2020-08-17 ENCOUNTER — Ambulatory Visit: Payer: Medicaid Other | Admitting: Family Medicine

## 2020-08-17 ENCOUNTER — Ambulatory Visit (INDEPENDENT_AMBULATORY_CARE_PROVIDER_SITE_OTHER): Payer: Medicaid Other | Admitting: Family Medicine

## 2020-08-17 ENCOUNTER — Other Ambulatory Visit: Payer: Self-pay

## 2020-08-17 ENCOUNTER — Encounter: Payer: Self-pay | Admitting: Family Medicine

## 2020-08-17 VITALS — BP 122/78 | HR 86 | Temp 98.2°F | Resp 16 | Ht 64.0 in | Wt 241.2 lb

## 2020-08-17 DIAGNOSIS — R079 Chest pain, unspecified: Secondary | ICD-10-CM

## 2020-08-17 DIAGNOSIS — D72829 Elevated white blood cell count, unspecified: Secondary | ICD-10-CM | POA: Diagnosis not present

## 2020-08-17 DIAGNOSIS — E785 Hyperlipidemia, unspecified: Secondary | ICD-10-CM | POA: Diagnosis not present

## 2020-08-17 DIAGNOSIS — Z6841 Body Mass Index (BMI) 40.0 and over, adult: Secondary | ICD-10-CM | POA: Diagnosis not present

## 2020-08-17 DIAGNOSIS — F419 Anxiety disorder, unspecified: Secondary | ICD-10-CM

## 2020-08-17 DIAGNOSIS — J3089 Other allergic rhinitis: Secondary | ICD-10-CM

## 2020-08-17 DIAGNOSIS — K219 Gastro-esophageal reflux disease without esophagitis: Secondary | ICD-10-CM

## 2020-08-17 DIAGNOSIS — E559 Vitamin D deficiency, unspecified: Secondary | ICD-10-CM

## 2020-08-17 NOTE — Patient Instructions (Signed)
Here are some resources to help you if you feel you are in a mental health crisis:  National Suicide Prevention Lifeline - Call 1-800-273-8255  for help - Website with more resources: https://suicidepreventionlifeline.org/  [facility] - Call 866-275-9552 for help. - Mobile Crisis Program available 24 hours a day, 365 days a year. - Available for anyone of any age in [place].  [facility] - Address: [address],  [place] - Telephone: [phone]  - Hours of Operation: Sunday - Saturday - 8:00 a.m. - 8:00 p.m. - Medicaid, Medicare (Government Issued Only), BCBS, and Cash - Pay - Crisis Management, Outpatient Individual & Group Therapy, Psychiatrists on-site to provide medication management, In-Home Psychiatric Care, and Peer Support Care.  National Mobile Crisis: 1-800-939-5911 - Mobile Crisis Program available 24 hours a day, 365 days a year. - Available for anyone of any age in [place]    

## 2020-08-17 NOTE — Progress Notes (Signed)
Name: Delfina Schreurs   MRN: [id number]    DOB: 1980-10-17   Date:08/17/2020       Progress Note  Chief Complaint  Patient presents with  . Follow-up  . Gastroesophageal Reflux  . Allergies     Subjective:   Destiny Flores is a 40 y.o. female, presents to clinic for f/up  GERD - better controlled with omeprazole  She wants to talk to a counselor/mental health referral, trouble getting in somewhere local   She is curious about weight management help or referrals with that  She had HA/slurred speech briefly yesterday upon waking, it caused anxiety, then CP and SOB - she felt panicked Her CP and SOB resolved quickly but she did worry all day and had a sense of dread.  No exertional sx She has seizures, hx of migraines and has neurologist - she has not followed up with them yet.  Hypercalcemia and low vit D - she held her high dose Vit D supplement, not on calcium supplement currently, was due to recheck of labs Lab Results  Component Value Date   CALCIUM 11.1 (H) 05/17/2020   Last vitamin D Lab Results  Component Value Date   VD25OH 61 05/17/2020   She mentions CP which woke her from sleep and caused anxiety -she felt very panicked for the last day and a half No shortness of breath syncope or diaphoresis but some palpitations      Current Outpatient Medications:  .  acetaminophen (TYLENOL) 500 MG tablet, Take 1,000 mg by mouth every 6 (six) hours as needed for moderate pain or headache., Disp: , Rfl:  .  Adalimumab (HUMIRA PEN-CD/UC/HS STARTER) 80 MG/0.8ML PNKT, Inject the contents of 1 pen (80mg ) under the skin once weekly., Disp: , Rfl:  .  ascorbic acid (VITAMIN C) 1000 MG tablet, Take by mouth., Disp: , Rfl:  .  aspirin-acetaminophen-caffeine (EXCEDRIN MIGRAINE) 250-250-65 MG tablet, Take 2 tablets by mouth every 6 (six) hours as needed for headache or migraine., Disp: , Rfl:  .  Brivaracetam 50 MG TABS, Take 50 mg by mouth daily., Disp: , Rfl:  .  clindamycin  (CLINDAGEL) 1 % gel, Apply 1 application topically daily as needed (flare up)., Disp: , Rfl:  .  clobetasol ointment (TEMOVATE) 0.05 %, Apply topically 2 (two) times daily., Disp: , Rfl:  .  felbamate (FELBATOL) 600 MG tablet, Take 600 mg by mouth 3 (three) times daily., Disp: , Rfl:  .  fexofenadine (ALLEGRA) 180 MG tablet, Take 1 tablet (180 mg total) by mouth daily., Disp: 90 tablet, Rfl: 1 .  HYDROcodone-acetaminophen (NORCO/VICODIN) 5-325 MG tablet, Take 1 tablet by mouth every 6 (six) hours as needed., Disp: , Rfl:  .  magnesium oxide (MAG-OX) 400 MG tablet, Take by mouth., Disp: , Rfl:  .  melatonin 5 MG TABS, Take by mouth., Disp: , Rfl:  .  naproxen sodium (ALEVE) 220 MG tablet, Take 440 mg by mouth at bedtime as needed (for pain or headache)., Disp: , Rfl:  .  omeprazole (PRILOSEC) 20 MG capsule, Take 1 capsule (20 mg total) by mouth daily., Disp: 90 capsule, Rfl: 3 .  spironolactone (ALDACTONE) 100 MG tablet, Take by mouth., Disp: , Rfl:  .  spironolactone (ALDACTONE) 100 MG tablet, Take 100 mg by mouth daily., Disp: , Rfl:  .  SUMAtriptan (IMITREX) 50 MG tablet, Take 50 mg by mouth daily as needed., Disp: , Rfl:  .  TURMERIC CURCUMIN PO, Take 2 capsules by mouth daily., Disp: ,  Rfl:   Patient Active Problem List   Diagnosis Date Noted  . Immunosuppression due to drug therapy (HCC) 05/17/2020  . Thrombocytosis 05/17/2020  . Hyperlipidemia 05/17/2020  . Gastroesophageal reflux disease without esophagitis 05/17/2020  . Chronic rhinosinusitis 05/17/2020  . Morbid obesity with BMI of 40.0-44.9, adult (HCC) 07/14/2019  . 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 Vitamin D deficiency 03/06/2018  . Cerebral palsy (HCC) 12/24/2017  . Epilepsy (HCC) 12/24/2017  . Hidradenitis suppurativa 12/24/2017  . 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 abscess surgery Bilateral 2006   armpits and groin.12 surgeries  overall.   Marland Kitchen. AXILLARY HIDRADENITIS EXCISION     MULTIPLE HYDRADENITIS SURGERIES IN [place]  . hidradenitis groin Left 04/2017   last surgery done on groin and the symptoms have returned  . HYDRADENITIS EXCISION N/A 01/16/2018   Procedure: EXCISION HIDRADENITIS GROIN;  Surgeon: Leafy RoPabon, Diego F, MD;  Location: [facility] ORS;  Service: General;  Laterality: N/A;  . KNEE SURGERY Left 2010   tendon and meniscus repair  . NOSE SURGERY  1998   deviated septum and sinus repair  . ROBOTIC ASSISTED LAPAROSCOPIC CHOLECYSTECTOMY N/A 07/24/2018   Procedure: ROBOTIC ASSISTED LAPAROSCOPIC CHOLECYSTECTOMY;  Surgeon: Leafy RoPabon, Diego F, MD;  Location: [facility] ORS;  Service: General;  Laterality: N/A;  . TONSILLECTOMY    . TYMPANOSTOMY TUBE PLACEMENT     as a kid  . VAGUS NERVE STIMULATOR GENERATOR CHANGE  03/2015    Family History  Problem Relation Age of Onset  . Hyperlipidemia Father   . Epilepsy Brother   . ADD / ADHD Brother   . Breast cancer Paternal Grandmother [id number]       Passed away at 36yo  . Asthma Mother     Social History   Tobacco Use  . Smoking status: Never Smoker  . Smokeless tobacco: Never Used  Vaping Use  . Vaping Use: Never used  Substance Use Topics  . Alcohol use: Never  . Drug use: Never     Allergies  Allergen Reactions  . Codeine Anaphylaxis  . Penicillins Anaphylaxis and Other (See Comments)    Has patient had a PCN reaction causing immediate rash, facial/tongue/throat swelling, SOB or lightheadedness with hypotension: Yes Has patient had a PCN reaction causing severe rash involving mucus membranes or skin necrosis: No Has patient had a PCN reaction that required hospitalization: Yes Has patient had a PCN reaction occurring within the last 10 years: No If all of the above answers are "NO", then may proceed with Cephalosporin use.  Has patient had a PCN reaction causing immediate rash, facial/tongue/throat swelling, SOB or lightheadedness with hypotension: Yes Has  patient had a PCN reaction causing severe rash involving mucus membranes or skin necrosis: No Has patient had a PCN reaction that required hospitalization: Yes Has patient had a PCN reaction occurring within the last 10 years: No If all of the above answers are "NO", then may proceed with Cephalosporin use. Has patient had a PCN reaction causing immediate rash, facial/tongue/throat swelling, SOB or lightheadedness with hypotension: Yes Has patient had a PCN reaction causing severe rash involving mucus membranes or skin necrosis: No Has patient had a PCN reaction that required hospitalization: Yes Has patient had a PCN reaction occurring within the last 10 years: No If all of the above answers are "NO", then may proceed with Cephalosporin use.  . Sulfa Antibiotics Hives  . Tape Other (See Comments)  Large Boils-PAPER TAPE OK TO USE Large Boils-PAPER TAPE OK TO USE    Health Maintenance  Topic Date Due  . PAP SMEAR-Modifier  Never done  . COVID-19 Vaccine (3 - Moderna risk 4-dose series) 11/17/2019  . MAMMOGRAM  08/04/2021  . TETANUS/TDAP  03/06/2028  . INFLUENZA VACCINE  Completed  . Hepatitis C Screening  Completed  . HIV Screening  Completed    Chart Review Today: I personally reviewed active problem list, medication list, allergies, family history, social history, health maintenance, notes from last encounter, lab results, imaging with the patient/caregiver today.   Review of Systems  Constitutional: Negative.   HENT: Negative.   Eyes: Negative.   Respiratory: Positive for chest tightness. Negative for cough and shortness of breath.   Cardiovascular: Positive for chest pain. Negative for leg swelling.  Gastrointestinal: Negative.   Endocrine: Negative.   Genitourinary: Negative.   Musculoskeletal: Negative.   Skin: Negative.  Negative for color change and pallor.  Allergic/Immunologic: Negative.   Neurological: Positive for headaches. Negative for syncope, weakness and  light-headedness.  Hematological: Negative.   Psychiatric/Behavioral: The patient is nervous/anxious.   All other systems reviewed and are negative.    Objective:   Vitals:   08/17/20 0901  BP: 122/78  Pulse: 86  Resp: 16  Temp: 98.2 F (36.8 C)  SpO2: 99%  Weight: 241 lb 3.2 oz (109.4 kg)  Height: 5\' 4"  (1.626 m)    Body mass index is 41.4 kg/m.  Physical Exam Vitals and nursing note reviewed.  Constitutional:      General: She is not in acute distress.    Appearance: Normal appearance. She is well-developed. She is obese. She is not ill-appearing, toxic-appearing or diaphoretic.     Interventions: Face mask in place.  HENT:     Head: Normocephalic and atraumatic.     Right Ear: External ear normal.     Left Ear: External ear normal.  Eyes:     General: Lids are normal. No scleral icterus.       Right eye: No discharge.        Left eye: No discharge.     Conjunctiva/sclera: Conjunctivae normal.  Neck:     Trachea: Phonation normal. No tracheal deviation.  Cardiovascular:     Rate and Rhythm: Normal rate and regular rhythm.  No extrasystoles are present.    Chest Wall: PMI is not displaced. No thrill.     Pulses: Normal pulses.          Radial pulses are 2+ on the right side and 2+ on the left side.       Posterior tibial pulses are 2+ on the right side and 2+ on the left side.     Heart sounds: Normal heart sounds. No murmur heard. No friction rub. No gallop.   Pulmonary:     Effort: Pulmonary effort is normal. No respiratory distress.     Breath sounds: Normal breath sounds. No stridor. No wheezing, rhonchi or rales.  Chest:     Chest wall: No tenderness.  Abdominal:     General: Bowel sounds are normal. There is no distension.     Palpations: Abdomen is soft.  Musculoskeletal:        General: No swelling.     Cervical back: Normal range of motion.     Right lower leg: No edema.     Left lower leg: No edema.  Skin:    General: Skin is warm and dry.  Capillary Refill: Capillary refill takes less than 2 seconds.     Coloration: Skin is not jaundiced or pale.     Findings: No rash.  Neurological:     Mental Status: She is alert. Mental status is at baseline.     Motor: No abnormal muscle tone.     Gait: Gait normal.  Psychiatric:        Attention and Perception: Attention normal.        Mood and Affect: Mood is anxious. Mood is not depressed. Affect is not labile, flat, tearful or inappropriate.        Speech: Speech normal.        Behavior: Behavior normal. Behavior is cooperative.        Thought Content: Thought content normal. Thought content does not include homicidal or suicidal ideation. Thought content does not include homicidal or suicidal plan.        Cognition and Memory: Cognition normal.      EKG: Sinus rhythm - no change from prior - see scan   Assessment & Plan:     ICD-10-CM   1. Gastroesophageal reflux disease without esophagitis  K21.9    Symptoms are better controlled now with PPI, continue omeprazole, wean off if able with pepcid BID, pt encouraged to f/up with any worsening  2. Chest pain, unspecified type  R07.9 EKG 12-Lead   EKG unremarkable, no sx currently, reviewed red flags, may be secondary to GERD or anxiety? if any sx return pt needs further cardiac work up  3. Hypercalcemia  E83.52    labs done - pt help her high dose OTC Vit D and is not on calcium supplement, calcium improved - like was from Vit D toxicity supplement D3 1000-2000 IU qd  4. Vitamin D deficiency  E55.9    pt vitamin D levels were normal she no longer needs to take high-dose vitamin D can do 1000 to 2000 IU daily to maintain  5. Leukocytosis, unspecified type  D72.829    History of persistent leukocytosis - monitoring  6. Hyperlipidemia, unspecified hyperlipidemia type  E78.5    see below  7. Anxiety  F41.9 Ambulatory referral to Psychiatry   needs to est with psych- referral placed for insurance but encouraged pt to contact clinics  directly - info/addresses given  8. Morbid obesity with BMI of 40.0-44.9, adult (HCC)  E66.01 Amb Ref to Medical Weight Management   Z68.41    pt requests referral to specialists      HLD - cholesterol high - ASCVD low - not currently on meds - monitor Lab Results  Component Value Date   CHOL 218 (H) 05/17/2020   HDL 54 05/17/2020   LDLCALC 141 (H) 05/17/2020   TRIG 110 05/17/2020   CHOLHDL 4.0 05/17/2020   The 10-year ASCVD risk score Denman George DC Jr., et al., 2013) is: 0.7%   Values used to calculate the score:     Age: 14 years     Sex: Female     Is Non-Hispanic African American: No     Diabetic: No     Tobacco smoker: No     Systolic Blood Pressure: 122 mmHg     Is BP treated: No     HDL Cholesterol: 54 mg/dL     Total Cholesterol: 218 mg/dL   Return in about 3 months (around 11/14/2020) for Routine follow-up.   Danelle Berry, PA-C 08/17/20 9:31 AM

## 2020-08-18 DIAGNOSIS — J310 Chronic rhinitis: Secondary | ICD-10-CM | POA: Diagnosis not present

## 2020-08-18 LAB — COMPLETE METABOLIC PANEL WITH GFR
Alkaline phosphatase (APISO): 78 U/L (ref 31–125)
Glucose, Bld: 81 mg/dL (ref 65–99)
Sodium: 139 mmol/L (ref 135–146)

## 2020-08-23 LAB — COMPLETE METABOLIC PANEL WITH GFR
AG Ratio: 1.3 (calc) (ref 1.0–2.5)
ALT: 21 U/L (ref 6–29)
AST: 17 U/L (ref 10–30)
Albumin: 3.9 g/dL (ref 3.6–5.1)
BUN: 10 mg/dL (ref 7–25)
CO2: 25 mmol/L (ref 20–32)
Calcium: 9.6 mg/dL (ref 8.6–10.2)
Chloride: 103 mmol/L (ref 98–110)
Creat: 0.58 mg/dL (ref 0.50–1.10)
GFR, Est African American: 133 mL/min/{1.73_m2} (ref >=60)
GFR, Est Non African American: 114 mL/min/{1.73_m2} (ref >=60)
Globulin: 3 g/dL (calc) (ref 1.9–3.7)
Potassium: 4.4 mmol/L (ref 3.5–5.3)
Total Bilirubin: 0.4 mg/dL (ref 0.2–1.2)
Total Protein: 6.9 g/dL (ref 6.1–8.1)

## 2020-08-23 LAB — VITAMIN D 1,25 DIHYDROXY
Vitamin D 1, 25 (OH)2 Total: 41 pg/mL (ref 18–72)
Vitamin D2 1, 25 (OH)2: <8 pg/mL
Vitamin D3 1, 25 (OH)2: 41 pg/mL

## 2020-08-23 LAB — CBC WITH DIFFERENTIAL/PLATELET
Absolute Monocytes: 785 cells/uL (ref 200–950)
Basophils Absolute: 71 cells/uL (ref 0–200)
Basophils Relative: 0.6 %
Eosinophils Absolute: 214 cells/uL (ref 15–500)
Eosinophils Relative: 1.8 %
HCT: 40.1 % (ref 35.0–45.0)
Hemoglobin: 13.5 g/dL (ref 11.7–15.5)
Lymphs Abs: 3856 cells/uL (ref 850–3900)
MCH: 30.3 pg (ref 27.0–33.0)
MCHC: 33.7 g/dL (ref 32.0–36.0)
MCV: 90.1 fL (ref 80.0–100.0)
MPV: 9.5 fL (ref 7.5–12.5)
Monocytes Relative: 6.6 %
Neutro Abs: 6973 cells/uL (ref 1500–7800)
Neutrophils Relative %: 58.6 %
Platelets: 541 10*3/uL — ABNORMAL HIGH (ref 140–400)
RBC: 4.45 10*6/uL (ref 3.80–5.10)
RDW: 12 % (ref 11.0–15.0)
Total Lymphocyte: 32.4 %
WBC: 11.9 10*3/uL — ABNORMAL HIGH (ref 3.8–10.8)

## 2020-08-24 ENCOUNTER — Ambulatory Visit: Payer: Medicaid Other | Admitting: Dietician

## 2020-09-08 DIAGNOSIS — J310 Chronic rhinitis: Secondary | ICD-10-CM | POA: Diagnosis not present

## 2020-09-09 ENCOUNTER — Other Ambulatory Visit: Payer: Self-pay

## 2020-09-09 ENCOUNTER — Encounter: Payer: Medicaid Other | Attending: Family Medicine | Admitting: Dietician

## 2020-09-09 ENCOUNTER — Encounter: Payer: Self-pay | Admitting: Dietician

## 2020-09-09 VITALS — Ht 64.0 in | Wt 240.0 lb

## 2020-09-09 DIAGNOSIS — Z6841 Body Mass Index (BMI) 40.0 and over, adult: Secondary | ICD-10-CM | POA: Insufficient documentation

## 2020-09-09 NOTE — Patient Instructions (Signed)
   Eat something every 4-5 hours during the day -- 3 meals and maybe 1 snack daily.  Plan to include 3 food groups with each meal: starch + protein + veg or fruit or both  Work to reduce portions of meats, while increasing portions of veggies. Using a small plate helps with portion control.

## 2020-09-09 NOTE — Progress Notes (Signed)
Medical Nutrition Therapy: Visit start time: 0830  end time: 0950  Assessment:  Diagnosis: obesity Past medical history: hidradenitis suppurativa, epilepsy, mild cerebral palsy, GERD, elevated cholesterol Psychosocial issues/ stress concerns: reports moderate stress level, but feels she is not dealing well with stress  Preferred learning method:  . Auditory . Visual . Hands-on #1  Current weight: 240.0lbs Height: 5'4" BMI: 41.2 Medications, supplements: reconciled list in medical record  Progress and evaluation:   Patient reports no significant weight changes recetnly. She did lose when participating in weight watchers, but had difficulty restarting recently. Tried on her own with limited success.   Wants lifestyle change and healthy habits.   Choleseterol elevated, not yet on any meds. Lab results from 05/17/20: total cholesterol 218, LDL 141, HDL 54, TG 110. Father and brothers also have hyperlipidemia.  Did follow ketogenic diet for a time when younger due to epilipsy some of the meds did lead to weight gain.  She is limiting sugar and avoiding gluten as they tend to be triggers for HS lesions. She does include dairy foods without issue.  Physical activity: no structured activity, does household chores and some care for mother with back problems.  Dietary Intake:  Usual eating pattern includes 2 meals and 1 snacks per day. Dining out frequency: 1-5 meals per week.  Breakfast: sometimes skips due to limited GF choices; often eggs with cheesein different ways with sausage patties, occ GF bread; occ cereal with milk (A2) Snack: none Lunch: usually none Snack: usually none; occ piece of cheese Supper: recently reduced restaurant meals; spaghetti; roast beef with sm amount gravy and mashed potatoes; GF chicken strips; rice and chicken + steamed veg Snack: popcorn Beverages: black coffee, water, 1-2 cans diet soda daily  Nutrition Care Education: Topics covered:  Basic  nutrition: basic food groups, appropriate nutrient balance, appropriate meal and snack schedule, general nutrition guidelines    Weight control: importance of low sugar and low fat choices; portion control strategies including using small plates, increasing low-carb vegetables; estimated energy needs for weight loss at 1300-1400kcal, provided guidance for 45% CHO, 25% protein, and 30% fat; benefits of eating at regular intervals; options for increasing physical activity Gluten free diet: meal and menu options; sources for GF foods  Nutritional Diagnosis:  [place]-2.1 Inpaired nutrition utilization and [place]-2.2 Altered nutrition-related laboratory As related to hidradenitis suppurativa triggers and hyperlipidemia.  As evidenced by patient reported HS symptoms, and eleveted total cholesterol and LDL. [place]-3.3 Overweight/obesity As related to eplilepsy treatments/ medications, limited physical activity, excess calories.  As evidenced by patient with current BMI of 41.  Intervention:  . Instruction and discussion as noted above. . Patient voices readiness to work on lifestyle changes to promote weight loss and overall health.  . Established nutrition goals with direction from patient.   Education Materials given:  . Plate Planner with food lists, sample meal pattern . Keys to Successful Weight Loss . Sample gluten free menus (NCM) . Visit summary with goals/ instructions   Learner/ who was taught:  . Patient   Level of understanding: Marland Kitchen Verbalizes/ demonstrates competency   Demonstrated degree of understanding via:   Teach back Learning barriers: . None  Willingness to learn/ readiness for change: . Acceptance, ready for change  Monitoring and Evaluation:  Dietary intake, exercise, and body weight      follow up: 10/20/20 at 8:30am

## 2020-09-09 NOTE — Unmapped (Signed)
Sarita reports her Humira 80mg  has helped to reduce her flares compared to her prior 40 mg dosage. She does continue to have flares around her period, but not all the time now.    She continues spironolactone and uses clobetasol to flares PRN.     [facility] Clinical Assessment & Refill Coordination Note    Rashidah [place], DOB: 04-30-1981  Phone: [phone] (home)     All above HIPAA information was verified with patient.     Was a Nurse, learning disability used for this call? No    Specialty Medication(s):   Inflammatory Disorders: Humira     Current Outpatient Medications   Medication Sig Dispense Refill   ??? adalimumab (HUMIRA,CF, PEN) 80 mg/0.8 mL PnKt Inject the contents of 1 pen (80mg ) under the skin once weekly. 4 each 11   ??? ADALIMUMAB PEN CITRATE FREE 40 MG/0.4 ML Inject the contents of 1 pen (40 mg) under the skin once weekly 4 each 11   ??? ascorbic acid, vitamin C, (VITAMIN C) 1000 MG tablet Take 1,000 mg by mouth.     ??? brivaracetam (BRIVIACT) 50 mg tablet Take 50 mg by mouth.     ??? cholecalciferol, vitamin D3, 1,250 mcg (50,000 unit) capsule Take 50,000 Units by mouth.     ??? clindamycin (CLEOCIN) 300 MG capsule 300mg  twice daily 63 capsule 2   ??? clobetasoL (TEMOVATE) 0.05 % ointment Apply topically Two (2) times a day. 60 g 1   ??? diazePAM (VALIUM) 10 MG tablet Take 10mg  30 minutes prior to procedure 1 tablet 0   ??? diazePAM (VALIUM) 5 MG tablet Take 5 mg by mouth.     ??? diazePAM (VALIUM) 5 MG tablet Take 5 mg by mouth every hour as needed.     ??? FELBATOL 600 mg tablet      ??? HYDROcodone-acetaminophen (NORCO) 5-325 mg per tablet Take 1-2 tablets by mouth every six (6) hours as needed for pain. 15 tablet 0   ??? magnesium oxide (MAG-OX) 400 mg (241.3 mg magnesium) tablet Take 400 mg by mouth daily.     ??? melatonin 5 mg tablet Take 7.5 mg by mouth.     ??? omeprazole (PRILOSEC) 20 MG capsule Take 20 mg by mouth.     ??? omeprazole (PRILOSEC) 20 MG capsule Take 20 mg by mouth.     ??? sodium chloride (AYR) 0.65 % Drop 1 spray into each nostril.     ??? spironolactone (ALDACTONE) 100 MG tablet Take 1 tablet (100 mg total) by mouth daily. 90 tablet 3   ??? turmeric, bulk, 95 % Powd Take 2 capsules by mouth.       No current facility-administered medications for this visit.        Changes to medications: [facility] reports no changes at this time.    Allergies   Allergen Reactions   ??? Codeine Anaphylaxis   ??? Penicillins Anaphylaxis and Other (See Comments)     Has patient had a PCN reaction causing immediate rash, facial/tongue/throat swelling, SOB or lightheadedness with hypotension: Yes  Has patient had a PCN reaction causing severe rash involving mucus membranes or skin necrosis: No  Has patient had a PCN reaction that required hospitalization: Yes  Has patient had a PCN reaction occurring within the last 10 years: No  If all of the above answers are NO, then may proceed with Cephalosporin use.  Has patient had a PCN reaction causing immediate rash, facial/tongue/throat swelling, SOB or lightheadedness with  hypotension: Yes  Has patient had a PCN reaction causing severe rash involving mucus membranes or skin necrosis: No  Has patient had a PCN reaction that required hospitalization: Yes  Has patient had a PCN reaction occurring within the last 10 years: No  If all of the above answers are NO, then may proceed with Cephalosporin use.     ??? Sulfa (Sulfonamide Antibiotics) Hives   ??? Adhesive Tape-Silicones Other (See Comments)     Large Boils-PAPER TAPE OK TO USE       Changes to allergies: No    SPECIALTY MEDICATION ADHERENCE     Humira 80/0.8 mg/ml: 1 left  Medication Adherence    Patient reported X missed doses in the last month: 0  Specialty Medication: Humira  Patient is on additional specialty medications: No          Specialty medication(s) dose(s) confirmed: Regimen is correct and unchanged.     Are there any concerns with adherence? No    Adherence counseling provided? Not needed    CLINICAL MANAGEMENT AND INTERVENTION      Clinical Benefit Assessment:    Do you feel the medicine is effective or helping your condition? Yes    Clinical Benefit counseling provided? Not needed    Adverse Effects Assessment:    Are you experiencing any side effects? No    Are you experiencing difficulty administering your medicine? No    Quality of Life Assessment:    How many days over the past month did your HS  keep you from your normal activities? For example, brushing your teeth or getting up in the morning. 0    Have you discussed this with your provider? Not needed    Therapy Appropriateness:    Is therapy appropriate? Yes, therapy is appropriate and should be continued    DISEASE/MEDICATION-SPECIFIC INFORMATION      For patients on injectable medications: Patient currently has 1 doses left.  Next injection is scheduled for 3/14.    PATIENT SPECIFIC NEEDS     - Does the patient have any physical, cognitive, or cultural barriers? No    - Is the patient high risk? No    - Does the patient require a Care Management Plan? No     - Does the patient require physician intervention or other additional services (i.e. nutrition, smoking cessation, social work)? No      SHIPPING     Specialty Medication(s) to be Shipped:   Inflammatory Disorders: Humira    Other medication(s) to be shipped: No additional medications requested for fill at this time     Changes to insurance: No    Delivery Scheduled: Yes, Expected medication delivery date: 3/15.     Medication will be delivered via Same Day Courier to the confirmed prescription address in [facility].    The patient will receive a drug information handout for each medication shipped and additional FDA Medication Guides as required.  Verified that patient has previously received a Conservation officer, historic buildings.    All of the patient's questions and concerns have been addressed.    Lanney Gins   [facility] Specialty Pharmacist

## 2020-09-12 ENCOUNTER — Encounter: Payer: Self-pay | Admitting: Family Medicine

## 2020-09-13 ENCOUNTER — Telehealth: Payer: Medicaid Other | Admitting: Physician Assistant

## 2020-09-13 ENCOUNTER — Encounter: Payer: Self-pay | Admitting: Physician Assistant

## 2020-09-13 DIAGNOSIS — S91331A Puncture wound without foreign body, right foot, initial encounter: Secondary | ICD-10-CM

## 2020-09-13 MED ORDER — DOXYCYCLINE HYCLATE 100 MG PO TABS: 100.0000 mg | ORAL_TABLET | Freq: Two times a day (BID) | ORAL | 0 refills | Status: DC

## 2020-09-13 MED FILL — HUMIRA(CF) PEN 80 MG/0.8 ML SUBCUTANEOUS KIT: 28 days supply | Qty: 4 | Fill #4

## 2020-09-13 NOTE — Patient Instructions (Signed)
Instructions sent to patients MyChart.

## 2020-09-13 NOTE — Progress Notes (Signed)
Ms. Destiny Flores, spalla are scheduled for a virtual visit with your provider today.    Just as we do with appointments in the office, we must obtain your consent to participate.  Your consent will be active for this visit and any virtual visit you may have with one of our providers in the next 365 days.    If you have a MyChart account, I can also send a copy of this consent to you electronically.  All virtual visits are billed to your insurance company just like a traditional visit in the office.  As this is a virtual visit, video technology does not allow for your provider to perform a traditional examination.  This may limit your provider's ability to fully assess your condition.  If your provider identifies any concerns that need to be evaluated in person or the need to arrange testing such as labs, EKG, etc, we will make arrangements to do so.    Although advances in technology are sophisticated, we cannot ensure that it will always work on either your end or our end.  If the connection with a video visit is poor, we may have to switch to a telephone visit.  With either a video or telephone visit, we are not always able to ensure that we have a secure connection.   I need to obtain your verbal consent now.   Are you willing to proceed with your visit today?   Destiny Flores has provided verbal consent on 09/13/2020 for a virtual visit (video or telephone).   Destiny Climes, PA-C 09/13/2020  1:39 PM     Virtual Visit via Video   I connected with patient on 09/13/20 at  1:45 PM EDT by a video enabled telemedicine application and verified that I am speaking with the correct person using two identifiers.  Location patient: Home Location provider: [facility] Persons participating in the virtual visit: Patient, Provider  I discussed the limitations of evaluation and management by telemedicine and the availability of in person appointments. The patient expressed understanding and  agreed to proceed.  Subjective:   HPI:   Patient presents via [facility] today complaining of a "sore" of the bottom of her right foot first noticed last night.  States the area is red and there is a defined area of scabbing.  Is tender to touch.  Denies any drainage.  Denies fever, chills, malaise or fatigue.  Denies any known trauma or injury but notes she was wearing some very poorly fitting and unsupportive shoes yesterday.  Denies similar symptoms elsewhere.  Notes she has never had this issue before.  States she has applied some Neosporin to the area.  Has not had to take/do anything else for symptoms.  Tdap is up-to-date.  ROS:   See pertinent positives and negatives per HPI.  Patient Active Problem List   Diagnosis Date Noted  . Immunosuppression due to drug therapy (HCC) 05/17/2020  . Thrombocytosis 05/17/2020  . Hyperlipidemia 05/17/2020  . Gastroesophageal reflux disease without esophagitis 05/17/2020  . Chronic rhinosinusitis 05/17/2020  . Morbid obesity with BMI of 40.0-44.9, adult (HCC) 07/14/2019  . 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 Vitamin D deficiency 03/06/2018  . Cerebral palsy (HCC) 12/24/2017  . Epilepsy (HCC) 12/24/2017  . Hidradenitis suppurativa 12/24/2017  . 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 Smoking status: Never Smoker  . Smokeless tobacco: Never Used  Substance Use Topics  .  Alcohol use: Never    Current Outpatient Medications:  .  acetaminophen (TYLENOL) 500 MG tablet, Take 1,000 mg by mouth every 6 (six) hours as needed for moderate pain or headache., Disp: , Rfl:  .  Adalimumab (HUMIRA PEN-CD/UC/HS STARTER) 80 MG/0.8ML PNKT, Inject the contents of 1 pen (80mg ) under the skin once weekly., Disp: , Rfl:  .  ascorbic acid (VITAMIN C) 1000 MG tablet, Take by mouth., Disp: , Rfl:  .  aspirin-acetaminophen-caffeine (EXCEDRIN MIGRAINE) 250-250-65 MG tablet, Take 2 tablets by mouth every  6 (six) hours as needed for headache or migraine., Disp: , Rfl:  .  Brivaracetam 50 MG TABS, Take 50 mg by mouth daily., Disp: , Rfl:  .  clindamycin (CLINDAGEL) 1 % gel, Apply 1 application topically daily as needed (flare up)., Disp: , Rfl:  .  clobetasol ointment (TEMOVATE) 0.05 %, Apply topically 2 (two) times daily., Disp: , Rfl:  .  felbamate (FELBATOL) 600 MG tablet, Take 600 mg by mouth 3 (three) times daily., Disp: , Rfl:  .  HYDROcodone-acetaminophen (NORCO/VICODIN) 5-325 MG tablet, Take 1 tablet by mouth every 6 (six) hours as needed., Disp: , Rfl:  .  magnesium oxide (MAG-OX) 400 MG tablet, Take by mouth., Disp: , Rfl:  .  melatonin 5 MG TABS, Take by mouth. (Patient not taking: Reported on 09/09/2020), Disp: , Rfl:  .  montelukast (SINGULAIR) 10 MG tablet, Take 10 mg by mouth daily., Disp: , Rfl:  .  naproxen sodium (ALEVE) 220 MG tablet, Take 440 mg by mouth at bedtime as needed (for pain or headache)., Disp: , Rfl:  .  omeprazole (PRILOSEC) 20 MG capsule, Take 1 capsule (20 mg total) by mouth daily., Disp: 90 capsule, Rfl: 3 .  pyridOXINE (VITAMIN B-6) 100 MG tablet, Take 100 mg by mouth daily., Disp: , Rfl:  .  spironolactone (ALDACTONE) 100 MG tablet, Take by mouth., Disp: , Rfl:  .  SUMAtriptan (IMITREX) 50 MG tablet, Take 50 mg by mouth daily as needed., Disp: , Rfl:  .  TURMERIC CURCUMIN PO, Take 2 capsules by mouth daily., Disp: , Rfl:   Allergies  Allergen Reactions  . Codeine Anaphylaxis  . Penicillins Anaphylaxis and Other (See Comments)    Has patient had a PCN reaction causing immediate rash, facial/tongue/throat swelling, SOB or lightheadedness with hypotension: Yes Has patient had a PCN reaction causing severe rash involving mucus membranes or skin necrosis: No Has patient had a PCN reaction that required hospitalization: Yes Has patient had a PCN reaction occurring within the last 10 years: No If all of the above answers are "NO", then may proceed with  Cephalosporin use.  Has patient had a PCN reaction causing immediate rash, facial/tongue/throat swelling, SOB or lightheadedness with hypotension: Yes Has patient had a PCN reaction causing severe rash involving mucus membranes or skin necrosis: No Has patient had a PCN reaction that required hospitalization: Yes Has patient had a PCN reaction occurring within the last 10 years: No If all of the above answers are "NO", then may proceed with Cephalosporin use. Has patient had a PCN reaction causing immediate rash, facial/tongue/throat swelling, SOB or lightheadedness with hypotension: Yes Has patient had a PCN reaction causing severe rash involving mucus membranes or skin necrosis: No Has patient had a PCN reaction that required hospitalization: Yes Has patient had a PCN reaction occurring within the last 10 years: No If all of the above answers are "NO", then may proceed with Cephalosporin use.  . Sulfa Antibiotics  Hives  . Tape Other (See Comments)    Large Boils-PAPER TAPE OK TO USE Large Boils-PAPER TAPE OK TO USE    Objective:   There were no vitals taken for this visit.  Patient is well-developed, well-nourished in no acute distress.  Resting comfortably at home.  Head is normocephalic, atraumatic.  No labored breathing.  Speech is clear and coherent with logical content.  Patient is alert and oriented at baseline.  Well delineated u-shaped area of scabbing of her medial plantar surface of R foot, about the width of pinky nail. Some mild surrounding erythema without any visible swelling. No streaking noted.   Assessment and Plan:   1. Puncture wound of right foot, initial encounter The area of concern is most consistent with a very small puncture wound of the foot of unknown source.  This is especially giving how well defined area of scabbing is.  There is evidence of healing in place but some noted redness.  Only mildly tender to touch.  No systemic symptoms.  Patient is  up-to-date on Tdap per record review.  Discussed supportive measures and skin care with patient.  Recommend she get a corn pad to place over the area so that she is placing weight/pressure on the corn pad and not the actual area that is trying to heal.  Did provide prescription for doxycycline to use as directed if she notes any worsening redness, tenderness or swelling, giving she is on Humira which puts her in an immunocompromised state.  Strict precautions given for when to follow-up with PCP and to be seen in urgent care or ER if indicated.  Patient voiced understanding and agreement with the plan.    Destiny Climes, PA-C 09/13/2020

## 2020-09-18 ENCOUNTER — Other Ambulatory Visit: Payer: Self-pay | Admitting: Family Medicine

## 2020-09-18 DIAGNOSIS — J329 Chronic sinusitis, unspecified: Secondary | ICD-10-CM

## 2020-09-19 DIAGNOSIS — G40909 Epilepsy, unspecified, not intractable, without status epilepticus: Secondary | ICD-10-CM | POA: Diagnosis not present

## 2020-09-23 ENCOUNTER — Telehealth: Payer: Medicaid Other | Admitting: Family Medicine

## 2020-09-28 ENCOUNTER — Other Ambulatory Visit: Payer: Self-pay

## 2020-09-28 ENCOUNTER — Telehealth (INDEPENDENT_AMBULATORY_CARE_PROVIDER_SITE_OTHER): Payer: Medicaid Other | Admitting: Physician Assistant

## 2020-09-28 ENCOUNTER — Encounter: Payer: Self-pay | Admitting: Physician Assistant

## 2020-09-28 DIAGNOSIS — J0110 Acute frontal sinusitis, unspecified: Secondary | ICD-10-CM

## 2020-09-28 DIAGNOSIS — Z0182 Encounter for allergy testing: Secondary | ICD-10-CM

## 2020-09-28 NOTE — Progress Notes (Addendum)
Virtual Visit via Video Note  I connected with Destiny Flores on 09/29/20 at 10:40 AM EDT by a video enabled telemedicine application and verified that I am speaking with the correct person using two identifiers.  Location: Patient: Home Provider: Office   I discussed the limitations of evaluation and management by telemedicine and the availability of in person appointments. The patient expressed understanding and agreed to proceed.  Established patient visit   Patient: Destiny Flores   DOB: 07/23/1980   40 y.o. Female  MRN: [id number] Visit Date: 09/28/2020  Today's healthcare provider: Trey Sailors, PA-C   Chief Complaint  Patient presents with  . Referral    Allergy Clinic at [facility].    Subjective    HPI HPI    Referral    Comments: Allergy Clinic at [facility].        Last edited by Riesa Pope, RMA on 09/28/2020 10:28 AM. (History)       Patient is requesting [facility] referral for allergy testing due to recurrent sinus infections. She has been seen previously by [facility] and she was told she does not have allergies. She would like a second opinion.      Medications: Outpatient Medications Prior to Visit  Medication Sig  . acetaminophen (TYLENOL) 500 MG tablet Take 1,000 mg by mouth every 6 (six) hours as needed for moderate pain or headache.  . Adalimumab (HUMIRA PEN-CD/UC/HS STARTER) 80 MG/0.8ML PNKT Inject the contents of 1 pen (80mg ) under the skin once weekly.  ascorbic acid (VITAMIN C) 1000 MG tablet Take by mouth.  Marland Kitchen aspirin-acetaminophen-caffeine (EXCEDRIN MIGRAINE) 250-250-65 MG tablet Take 2 tablets by mouth every 6 (six) hours as needed for headache or migraine.  . Brivaracetam 50 MG TABS Take 50 mg by mouth daily.  . clindamycin (CLINDAGEL) 1 % gel Apply 1 application topically daily as needed (flare up).  . clobetasol ointment (TEMOVATE) 0.05 % Apply topically 2 (two) times daily.  Marland Kitchen doxycycline (VIBRA-TABS) 100 MG tablet Take 1 tablet (100  mg total) by mouth 2 (two) times daily.  . felbamate (FELBATOL) 600 MG tablet Take 600 mg by mouth 3 (three) times daily.  Marland Kitchen HYDROcodone-acetaminophen (NORCO/VICODIN) 5-325 MG tablet Take 1 tablet by mouth every 6 (six) hours as needed.  . magnesium oxide (MAG-OX) 400 MG tablet Take by mouth.  . montelukast (SINGULAIR) 10 MG tablet Take 10 mg by mouth daily.  Marland Kitchen omeprazole (PRILOSEC) 20 MG capsule Take 1 capsule (20 mg total) by mouth daily.  Marland Kitchen pyridOXINE (VITAMIN B-6) 100 MG tablet Take 100 mg by mouth daily.  Marland Kitchen spironolactone (ALDACTONE) 100 MG tablet Take by mouth.  . SUMAtriptan (IMITREX) 50 MG tablet Take 50 mg by mouth daily as needed.  . TURMERIC CURCUMIN PO Take 2 capsules by mouth daily.   No facility-administered medications prior to visit.    Review of Systems  All other systems reviewed and are negative.      Objective    There were no vitals taken for this visit.    Physical Exam Constitutional:      Appearance: Normal appearance.  Pulmonary:     Effort: Pulmonary effort is normal. No respiratory distress.  Neurological:     Mental Status: She is alert.  Psychiatric:        Mood and Affect: Mood normal.        Behavior: Behavior normal.       No results found for any visits on 09/28/20.  Assessment &  Plan    1. Acute frontal sinusitis, recurrence not specified  - Ambulatory referral to Allergy  2. Encounter for allergy testing  - Ambulatory referral to Allergy   Return if symptoms worsen or fail to improve.         Trey Sailors, PA-C  [facility] [phone] (phone) [phone] (fax)  [facility]

## 2020-10-05 NOTE — Unmapped (Signed)
[facility] Refill Coordination Note    Specialty Medication(s) to be Shipped:   Inflammatory Disorders: Humira 80mg /0.49ml    Other medication(s) to be shipped: No additional medications requested for fill at this time     Erica Norman, DOB: Oct 01, 1980  Phone: [phone] (home)       All above HIPAA information was verified with patient.     Was a Nurse, learning disability used for this call? No    Completed refill call assessment today to schedule patient's medication shipment from the [facility] [phone]).       Specialty medication(s) and dose(s) confirmed: Regimen is correct and unchanged.   Changes to medications: Prisma reports no changes at this time.  Changes to insurance: No  Questions for the pharmacist: No    Confirmed patient received a Conservation officer, historic buildings and a Surveyor, mining with first shipment. The patient will receive a drug information handout for each medication shipped and additional FDA Medication Guides as required.       DISEASE/MEDICATION-SPECIFIC INFORMATION        For patients on injectable medications: Patient currently has 1 doses left.  Next injection is scheduled for 10/11/2020.    SPECIALTY MEDICATION ADHERENCE     Medication Adherence    Specialty Medication: humira 80mg /0.65ml  Patient is on additional specialty medications: No  Patient is on more than two specialty medications: No          Humira 80mg /0.61ml: 7 days of medicine on hand       SHIPPING     Shipping address confirmed in Epic.     Delivery Scheduled: Yes, Expected medication delivery date: 10/12/2020.     Medication will be delivered via Same Day Courier to the prescription address in Epic WAM.    Lupita Shutter   [facility] Specialty Pharmacist

## 2020-10-12 MED FILL — HUMIRA(CF) PEN 80 MG/0.8 ML SUBCUTANEOUS KIT: 28 days supply | Qty: 4 | Fill #5

## 2020-10-20 ENCOUNTER — Encounter: Payer: Medicaid Other | Attending: Family Medicine | Admitting: Dietician

## 2020-10-20 ENCOUNTER — Encounter: Payer: Self-pay | Admitting: Dietician

## 2020-10-20 ENCOUNTER — Other Ambulatory Visit: Payer: Self-pay

## 2020-10-20 VITALS — Ht 64.0 in | Wt 239.3 lb

## 2020-10-20 DIAGNOSIS — Z6841 Body Mass Index (BMI) 40.0 and over, adult: Secondary | ICD-10-CM | POA: Diagnosis not present

## 2020-10-20 NOTE — Patient Instructions (Signed)
   Continue to work on portion control -- eat slowly, chew foods thoroughly, continue to eat generous portions of low carb veggies, drink a few sips of water after every few bites to help with fulness.   Try drinking a small glass (4oz) juice when recovering from a migraine to ensure blood sugar is not low and to take the edge off hunger/ cravings.  Keep working towards eating every 4-5 hours during the day. A light meal or "balanced" snack can make do for a lunch if not very hungry.

## 2020-10-20 NOTE — Progress Notes (Signed)
Medical Nutrition Therapy: Visit start time: 0830  end time: 0900  Assessment:  Diagnosis: obesity Medical history changes: no changes Psychosocial issues/ stress concerns: no changes   Current weight: 239.3lbs Height: 5'4" BMI: 41.08 Medications, supplement changes: no changes  Progress and evaluation:  . Patient reports decrease in food portions until Easter week.  . Trying to eat 3-4 times daily, although challenging at times . Also drinking more water and eating more mindfully. . Sometimes skips a meal due to lack of hunger, which throws off the meal schedule for the rest of the day. Marland Kitchen Has been eating more candy in the past week, and doesn't feel as well, ie less energy. . She reports significant increase in hunger, cravings for high carb foods after she has a migraine   Physical activity: ADLs -- housework, caring for mother with back problems.  Dietary Intake:  Usual eating pattern includes 2-3 meals and 1-2 snacks per day. Dining out frequency: 1-3 meals per week.  Breakfast: 2 eggs with sm amount cheese and few sausage crumbles, OJ/ fruit Snack: none Lunch: often skips due to later breakfast (sleeping late); sometimes sandwich with deli meat; leftovers Snack: recently candy-- tries to limit portion; veggie straws; occ carrot sticks; usually none Supper: small- moderate portion meat + vegetables; 2 tacos with now increased portion of lettuce, tomato,etc Snack: sometimes small portion popcorn Beverages: water, black coffee, 0-1 can diet soda daily  Nutrition Care Education: Topics covered:  Basic nutrition: reviewed appropriate nutrient balance, appropriate meal and snack schedule Weight control: reviewed progress since previous visit; portion control strategies ie slow eating, drinking water during meal, incorporating more low carb vegetables Hyperlipidemia:  appropriate food choices; role of exercise Gluten free diet:  Discussed gluten free options and resources; discussed  cross-contamination of foods and whether likely to cause problems with hidradenitis suppurativa lesions  Nutritional Diagnosis:  Omar-2.1 Inpaired nutrition utilization and [place]-2.2 Altered nutrition-related laboratory As related to hidradenitis suppurativa triggers and hyperlipidemia.  As evidenced by patient reported HS symptoms, and eleveted total cholesterol and LDL. Corley-3.3 Overweight/obesity As related to eplilepsy treatments/ medications, limited physical activity, excess calories.  As evidenced by patient with current BMI of 41.  Intervention:  . Instruction and discussion as noted above. . Patient has been working on positive diet changes, and is motivated to continue.  Marland Kitchen Updated nutrition goals with direction from patient.  Education Materials given:  Marland Kitchen Snacking handout . Visit summary with goals/ instructions   Learner/ who was taught:  . Patient   Level of understanding: Marland Kitchen Verbalizes/ demonstrates competency   Demonstrated degree of understanding via:   Teach back Learning barriers: . None  Willingness to learn/ readiness for change: . Eager, change in progress   Monitoring and Evaluation:  Dietary intake, exercise, and body weight      follow up: 12/01/20 at 2:45pm

## 2020-10-31 DIAGNOSIS — J309 Allergic rhinitis, unspecified: Secondary | ICD-10-CM | POA: Insufficient documentation

## 2020-10-31 DIAGNOSIS — J329 Chronic sinusitis, unspecified: Secondary | ICD-10-CM | POA: Diagnosis not present

## 2020-10-31 DIAGNOSIS — T360X5A Adverse effect of penicillins, initial encounter: Secondary | ICD-10-CM | POA: Diagnosis not present

## 2020-11-08 NOTE — Unmapped (Signed)
The [facility] has made a second and final attempt to reach this patient to refill the following medication:Humira.      We have left voicemails on the following phone numbers: [phone] and have sent a MyChart message.    Dates contacted: 5/5 and 5/10  Last scheduled delivery: 4/13    The patient may be at risk of non-compliance with this medication. The patient should call the [facility] at [phone] (option 4) to refill medication.    Sissi Padia D Administrator [facility] Specialty Technician

## 2020-11-14 ENCOUNTER — Ambulatory Visit: Payer: Medicaid Other | Admitting: Family Medicine

## 2020-11-17 DIAGNOSIS — G40909 Epilepsy, unspecified, not intractable, without status epilepticus: Secondary | ICD-10-CM | POA: Diagnosis not present

## 2020-11-19 NOTE — Unmapped (Signed)
This encounter was created in error - please disregard.

## 2020-11-22 ENCOUNTER — Telehealth: Payer: Medicaid Other | Admitting: Nurse Practitioner

## 2020-11-22 ENCOUNTER — Encounter: Payer: Self-pay | Admitting: Nurse Practitioner

## 2020-11-22 DIAGNOSIS — L089 Local infection of the skin and subcutaneous tissue, unspecified: Secondary | ICD-10-CM | POA: Diagnosis not present

## 2020-11-22 DIAGNOSIS — T2111XA Burn of first degree of chest wall, initial encounter: Secondary | ICD-10-CM | POA: Diagnosis not present

## 2020-11-22 MED ORDER — DOXYCYCLINE HYCLATE 100 MG PO TABS: 100.0000 mg | ORAL_TABLET | Freq: Two times a day (BID) | ORAL | 0 refills | Status: AC
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
## 2020-12-01 ENCOUNTER — Ambulatory Visit: Payer: Medicaid Other | Admitting: Dietician

## 2020-12-08 ENCOUNTER — Ambulatory Visit: Payer: Medicaid Other | Admitting: Family Medicine

## 2020-12-20 ENCOUNTER — Ambulatory Visit: Payer: Self-pay

## 2020-12-22 MED ORDER — MONTELUKAST 10 MG TABLET
Freq: Every evening | ORAL | 0.00000 days
Start: 2020-12-22 — End: ?

## 2020-12-22 NOTE — Unmapped (Signed)
[facility] Refill Coordination Note    Specialty Medication(s) to be Shipped:   Inflammatory Disorders: Humira    Other medication(s) to be shipped: No additional medications requested for fill at this time     Erica Norman, DOB: Sep 17, 1980  Phone: [phone] (home)       All above HIPAA information was verified with patient.     Was a Nurse, learning disability used for this call? No    Completed refill call assessment today to schedule patient's medication shipment from the [facility] [phone]).  All relevant notes have been reviewed.     Specialty medication(s) and dose(s) confirmed: Regimen is correct and unchanged.   Changes to medications: Erica Norman reports no changes at this time.  Changes to insurance: No  New side effects reported not previously addressed with a pharmacist or physician: None reported  Questions for the pharmacist: No    Confirmed patient received a Conservation officer, historic buildings and a Surveyor, mining with first shipment. The patient will receive a drug information handout for each medication shipped and additional FDA Medication Guides as required.       DISEASE/MEDICATION-SPECIFIC INFORMATION        For patients on injectable medications: Patient currently has 0 doses left.  Next injection is scheduled for 12/27/20.    SPECIALTY MEDICATION ADHERENCE     Medication Adherence    Patient reported X missed doses in the last month: 0  Specialty Medication: adalimumab (HUMIRA,CF, PEN) 80 mg/0.8 mL PnKt  Patient is on additional specialty medications: No              Were doses missed due to medication being on hold? No        REFERRAL TO PHARMACIST     Referral to the pharmacist: Not needed      [facility]     Shipping address confirmed in Epic.     Delivery Scheduled: Yes, Expected medication delivery date: 12/26/20.     Medication will be delivered via Same Day Courier to the prescription address in Epic WAM.    Erica Norman   [facility] Specialty Technician

## 2020-12-26 MED FILL — HUMIRA(CF) PEN 80 MG/0.8 ML SUBCUTANEOUS KIT: 28 days supply | Qty: 4 | Fill #6

## 2020-12-30 ENCOUNTER — Ambulatory Visit: Payer: Medicaid Other | Admitting: Family Medicine

## 2021-01-17 ENCOUNTER — Ambulatory Visit: Payer: Medicaid Other | Admitting: Family Medicine

## 2021-01-17 ENCOUNTER — Other Ambulatory Visit: Payer: Self-pay

## 2021-01-17 ENCOUNTER — Encounter: Payer: Self-pay | Admitting: Family Medicine

## 2021-01-17 VITALS — BP 124/72 | HR 96 | Temp 98.1°F | Resp 16 | Ht 64.0 in | Wt 240.3 lb

## 2021-01-17 DIAGNOSIS — F439 Reaction to severe stress, unspecified: Secondary | ICD-10-CM | POA: Diagnosis not present

## 2021-01-17 DIAGNOSIS — E785 Hyperlipidemia, unspecified: Secondary | ICD-10-CM

## 2021-01-17 DIAGNOSIS — Z6841 Body Mass Index (BMI) 40.0 and over, adult: Secondary | ICD-10-CM

## 2021-01-17 DIAGNOSIS — G809 Cerebral palsy, unspecified: Secondary | ICD-10-CM | POA: Diagnosis not present

## 2021-01-17 DIAGNOSIS — G40909 Epilepsy, unspecified, not intractable, without status epilepticus: Secondary | ICD-10-CM

## 2021-01-17 DIAGNOSIS — F419 Anxiety disorder, unspecified: Secondary | ICD-10-CM

## 2021-01-17 DIAGNOSIS — K219 Gastro-esophageal reflux disease without esophagitis: Secondary | ICD-10-CM

## 2021-01-17 MED ORDER — PANTOPRAZOLE 40 MG TABLET,DELAYED RELEASE
0.00000 days
Start: 2021-01-17 — End: ?

## 2021-01-17 MED ORDER — PANTOPRAZOLE SODIUM 40 MG PO TBEC: 40.0000 mg | DELAYED_RELEASE_TABLET | Freq: Two times a day (BID) | ORAL | 1 refills | Status: DC

## 2021-01-17 NOTE — Progress Notes (Signed)
Name: Destiny Flores   MRN: [id number]    DOB: 03/12/1981   Date:01/17/2021       Progress Note  Chief Complaint  Patient presents with   Follow-up     Subjective:   Destiny FurnishRebecca Baillargeon is a 40 y.o. female, presents to clinic for routine f/up  Last appt with PCP pt had CP, episode of HA and dread - concerned for event - no episodes since  Vit D deficiency and hypercalcemia - vit d supplement dose adjusted and calcium normalized, she was taking 50000IU daily supplement the was buying online  Anxiety - pt did get est with psychiatry  Obesity - working with nutritionist  Pt with recurrent sinusitis - [facility] est [facility] and has seen ENT in the past, currently on singulair, antihistamine and steroid nasal sprays (budesonide and allegra)  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 Imogene BurnLuedke: 1) Continue Briviact at 100mg  twice a day 2) Continue felbatol 600mg  3 times a day for now 3) Continue your vitamin D3, vitamin B6 4) Please take your melatonin, 1.5 tablets (7.5mg ) nightly for sleep andmigraines  GERD- uncontrolled - burning in her stomach, reflux, worse at night waking her up, flares up with stress (a lot of stress lately) and food trigger.     Current Outpatient Medications:    acetaminophen (TYLENOL) 500 MG tablet, Take 1,000 mg by mouth every 6 (six) hours as needed for moderate pain or headache., Disp: , Rfl:    Adalimumab (HUMIRA PEN-CD/UC/HS STARTER) 80 MG/0.8ML PNKT, Inject the contents of 1 pen (80mg ) under the skin once weekly., Disp: , Rfl:    ascorbic acid (VITAMIN C) 1000 MG tablet, Take by mouth., Disp: , Rfl:    aspirin-acetaminophen-caffeine (EXCEDRIN MIGRAINE) 250-250-65 MG tablet, Take 2 tablets by mouth every 6 (six) hours as needed for  headache or migraine., Disp: , Rfl:    Brivaracetam (BRIVIACT) 100 MG TABS, Take 1 tablet by mouth 2 (two) times daily., Disp: , Rfl:    budesonide (RHINOCORT AQUA) 32 MCG/ACT nasal spray, Place into the nose., Disp: , Rfl:    clobetasol ointment (TEMOVATE) 0.05 %, Apply topically 2 (two) times daily., Disp: , Rfl:    felbamate (FELBATOL) 600 MG tablet, Take 600 mg by mouth 3 (three) times daily., Disp: , Rfl:    fexofenadine (ALLEGRA) 180 MG tablet, Take by mouth., Disp: , Rfl:    HYDROcodone-acetaminophen (NORCO/VICODIN) 5-325 MG tablet, Take 1 tablet by mouth every 6 (six) hours as needed., Disp: , Rfl:    magnesium oxide (MAG-OX) 400 MG tablet, Take by mouth., Disp: , Rfl:    Melatonin 3 MG CAPS, Take by mouth., Disp: , Rfl:    montelukast (SINGULAIR) 10 MG tablet, Take 10 mg by mouth daily., Disp: , Rfl:    omeprazole (PRILOSEC) 20 MG capsule, Take 1 capsule (20 mg total) by mouth daily., Disp: 90 capsule, Rfl: 3   pyridOXINE (VITAMIN B-6) 100 MG tablet, Take 100 mg by mouth daily., Disp: , Rfl:    spironolactone (ALDACTONE) 100 MG tablet, Take by mouth., Disp: , Rfl:    SUMAtriptan (IMITREX) 50 MG tablet, Take 50 mg by mouth daily as needed., Disp: , Rfl:    TURMERIC CURCUMIN PO, Take 2 capsules by mouth daily., Disp: , Rfl:   Patient Active Problem List  Diagnosis Date Noted   Immunosuppression due to drug therapy (HCC) 05/17/2020   Thrombocytosis 05/17/2020   Hyperlipidemia 05/17/2020   Gastroesophageal reflux disease without esophagitis 05/17/2020   Chronic rhinosinusitis 05/17/2020   Morbid obesity with BMI of 40.0-44.9, adult (HCC) 07/14/2019   Developmental delay, mild 03/10/2018   Vitamin D deficiency 03/06/2018   Cerebral palsy (HCC) 12/24/2017   Epilepsy (HCC) 12/24/2017   Hidradenitis suppurativa 12/24/2017   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    Past Surgical History:  Procedure Laterality Date   abscess surgery Bilateral 2006   armpits  and groin.12 surgeries overall.    AXILLARY HIDRADENITIS EXCISION     MULTIPLE HYDRADENITIS SURGERIES IN [place]   hidradenitis groin Left 04/2017   last surgery done on groin and the symptoms have returned   HYDRADENITIS EXCISION N/A 01/16/2018   Procedure: EXCISION HIDRADENITIS GROIN;  Surgeon: Leafy Ro, MD;  Location: [facility] ORS;  Service: General;  Laterality: N/A;   KNEE SURGERY Left 2010   tendon and meniscus repair   NOSE SURGERY  1998   deviated septum and sinus repair   ROBOTIC ASSISTED LAPAROSCOPIC CHOLECYSTECTOMY N/A 07/24/2018   Procedure: ROBOTIC ASSISTED LAPAROSCOPIC CHOLECYSTECTOMY;  Surgeon: Leafy Ro, MD;  Location: [facility] ORS;  Service: General;  Laterality: N/A;   TONSILLECTOMY     TYMPANOSTOMY TUBE PLACEMENT     as a kid   VAGUS NERVE STIMULATOR GENERATOR CHANGE  03/2015    Family History  Problem Relation Age of Onset   Hyperlipidemia Father    Epilepsy Brother    ADD / ADHD Brother    Breast cancer Paternal Grandmother 49       Passed away at 1yo   Asthma Mother     Social History   Tobacco Use   Smoking status: Never   Smokeless tobacco: Never  Vaping Use   Vaping Use: Never used  Substance Use Topics   Alcohol use: Never   Drug use: Never     Allergies  Allergen Reactions   Codeine Anaphylaxis   Penicillins Anaphylaxis and Other (See Comments)    Has patient had a PCN reaction causing immediate rash, facial/tongue/throat swelling, SOB or lightheadedness with hypotension: Yes Has patient had a PCN reaction causing severe rash involving mucus membranes or skin necrosis: No Has patient had a PCN reaction that required hospitalization: Yes Has patient had a PCN reaction occurring within the last 10 years: No If all of the above answers are "NO", then may proceed with Cephalosporin use.  Has patient had a PCN reaction causing immediate rash, facial/tongue/throat swelling, SOB or lightheadedness with hypotension: Yes Has patient had a  PCN reaction causing severe rash involving mucus membranes or skin necrosis: No Has patient had a PCN reaction that required hospitalization: Yes Has patient had a PCN reaction occurring within the last 10 years: No If all of the above answers are "NO", then may proceed with Cephalosporin use. Has patient had a PCN reaction causing immediate rash, facial/tongue/throat swelling, SOB or lightheadedness with hypotension: Yes Has patient had a PCN reaction causing severe rash involving mucus membranes or skin necrosis: No Has patient had a PCN reaction that required hospitalization: Yes Has patient had a PCN reaction occurring within the last 10 years: No If all of the above answers are "NO", then may proceed with Cephalosporin use.   Sulfa Antibiotics Hives   Tape Other (See Comments)    Large Boils-PAPER TAPE OK TO USE Large Boils-PAPER TAPE OK  TO USE    Health Maintenance  Topic Date Due   Pneumococcal Vaccine 9-10 Years old (1 - PCV) Never done   PAP SMEAR-Modifier  Never done   COVID-19 Vaccine (3 - Moderna risk series) 11/17/2019   INFLUENZA VACCINE  01/30/2021   MAMMOGRAM  08/04/2021   TETANUS/TDAP  03/06/2028   Hepatitis C Screening  Completed   HIV Screening  Completed   HPV VACCINES  Aged Out    Chart Review Today: I personally reviewed active problem list, medication list, allergies, family history, social history, health maintenance, notes from last encounter, lab results, imaging with the patient/caregiver today.   Review of Systems  Constitutional: Negative.   HENT: Negative.    Eyes: Negative.   Respiratory: Negative.    Cardiovascular: Negative.   Gastrointestinal: Negative.   Endocrine: Negative.   Genitourinary: Negative.   Musculoskeletal: Negative.   Skin: Negative.   Allergic/Immunologic: Negative.   Neurological: Negative.   Hematological: Negative.   Psychiatric/Behavioral: Negative.    All other systems reviewed and are negative.   Objective:    Vitals:   01/17/21 1035  BP: 124/72  Pulse: 96  Resp: 16  Temp: 98.1 F (36.7 C)  SpO2: 95%  Weight: 240 lb 4.8 oz (109 kg)  Height: 5\' 4"  (1.626 m)    Body mass index is 41.25 kg/m.  Physical Exam Vitals and nursing note reviewed.  Constitutional:      General: She is not in acute distress.    Appearance: Normal appearance. She is well-developed. She is obese. She is not ill-appearing, toxic-appearing or diaphoretic.     Interventions: Face mask in place.  HENT:     Head: Normocephalic and atraumatic.     Right Ear: External ear normal.     Left Ear: External ear normal.  Eyes:     General: Lids are normal. No scleral icterus.       Right eye: No discharge.        Left eye: No discharge.     Conjunctiva/sclera: Conjunctivae normal.  Neck:     Trachea: Phonation normal. No tracheal deviation.  Cardiovascular:     Rate and Rhythm: Normal rate and regular rhythm.     Pulses: Normal pulses.          Radial pulses are 2+ on the right side and 2+ on the left side.       Posterior tibial pulses are 2+ on the right side and 2+ on the left side.     Heart sounds: Normal heart sounds. No murmur heard.   No friction rub. No gallop.  Pulmonary:     Effort: Pulmonary effort is normal. No respiratory distress.     Breath sounds: Normal breath sounds. No stridor. No wheezing, rhonchi or rales.  Chest:     Chest wall: No tenderness.  Abdominal:     General: Bowel sounds are normal. There is no distension.     Palpations: Abdomen is soft.  Musculoskeletal:     Right lower leg: No edema.     Left lower leg: No edema.  Skin:    General: Skin is warm and dry.     Coloration: Skin is not jaundiced or pale.     Findings: No rash.  Neurological:     Mental Status: She is alert. Mental status is at baseline.     Motor: No abnormal muscle tone.     Gait: Gait normal.  Psychiatric:  Mood and Affect: Mood normal.        Speech: Speech normal.        Behavior: Behavior  normal.        Assessment & Plan:     ICD-10-CM   1. Gastroesophageal reflux disease without esophagitis  K21.9    worse sx, encouraged diet/lifestyle changes for GERD, trial of protonix    2. Hyperlipidemia, unspecified hyperlipidemia type  E78.5    monitoring, not currently on statin    3. Anxiety  F41.9    previously referred to psychiatry, symptomatic but coping    4. Cerebral palsy, unspecified type (HCC) Chronic G80.9    per neurology    5. 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 Situational stress  F43.9    increasing anxiety sx with recent increased stressors including her mother's health and recent hospitalization    7. Morbid obesity with BMI of 40.0-44.9, adult (HCC)  E66.01    Z68.41    referred to weight management/nutritionist       No follow-ups on file.   Danelle Berry, PA-C 01/17/21 10:41 AM

## 2021-01-17 NOTE — Patient Instructions (Signed)
Managing Stress, Adult Feeling a certain amount of stress is normal. Stress helps our body and mind get ready to deal with the demands of life. Stress hormones can motivate you to do well at work and meet your responsibilities. However severe or long-lasting (chronic) stress can affect your mental and physical health. Chronic stress puts you at higher risk for anxiety, depression, and other health problems like digestiveproblems, muscle aches, heart disease, high blood pressure, and stroke. What are the causes? Common causes of stress include: Demands from work, such as deadlines, feeling overworked, or having long hours. Pressures at home, such as money issues, disagreements with a spouse, or parenting issues. Pressures from major life changes, such as divorce, moving, loss of a loved one, or chronic illness. You may be at higher risk for stress-related problems if you do not get enough sleep, are in poor health, do not have emotional support, or have a mentalhealth disorder like anxiety or depression. How to recognize stress Stress can make you: Have trouble sleeping. Feel sad, anxious, irritable, or overwhelmed. Lose your appetite. Overeat or want to eat unhealthy foods. Want to use drugs or alcohol. Stress can also cause physical symptoms, such as: Sore, tense muscles, especially in the shoulders and neck. Headaches. Trouble breathing. A faster heart rate. Stomach pain, nausea, or vomiting. Diarrhea or constipation. Trouble concentrating. Follow these instructions at home: Lifestyle Identify the source of your stress and your reaction to it. See a therapist who can help you change your reactions. When there are stressful events: Talk about it with family, friends, or co-workers. Try to think realistically about stressful events and not ignore them or overreact. Try to find the positives in a stressful situation and not focus on the negatives. Cut back on responsibilities at work  and home, if possible. Ask for help from friends or family members if you need it. Find ways to cope with stress, such as: Meditation. Deep breathing. Yoga or tai chi. Progressive muscle relaxation. Doing art, playing music, or reading. Making time for fun activities. Spending time with family and friends. Get support from family, friends, or spiritual resources. Eating and drinking Eat a healthy diet. This includes: Eating foods that are high in fiber, such as beans, whole grains, and fresh fruits and vegetables. Limiting foods that are high in fat and processed sugars, such as fried and sweet foods. Do not skip meals or overeat. Drink enough fluid to keep your urine pale yellow. Alcohol use Do not drink alcohol if: Your health care provider tells you not to drink. You are pregnant, may be pregnant, or are planning to become pregnant. Drinking alcohol is a way some people try to ease their stress. This can be dangerous, so if you drink alcohol: Limit how much you use to: 0-1 drink a day for women. 0-2 drinks a day for men. Be aware of how much alcohol is in your drink. In the [place] one drink equals one 12 oz bottle of beer (355 mL), one 5 oz glass of wine (148 mL), or one 1 oz glass of hard liquor (44 mL). Activity  Include 30 minutes of exercise in your daily schedule. Exercise is a good stress reducer. Include time in your day for an activity that you find relaxing. Try taking a walk, going on a bike ride, reading a book, or listening to music. Schedule your time in a way that lowers stress, and keep a consistent schedule. Prioritize what is most important to get done.  General  instructions Get enough sleep. Try to go to sleep and get up at about the same time every day. Take over-the-counter and prescription medicines only as told by your health care provider. Do not use any products that contain nicotine or tobacco, such as cigarettes, e-cigarettes, and chewing tobacco. If you  need help quitting, ask your health care provider. Do not use drugs or smoke to cope with stress. Keep all follow-up visits as told by your health care provider. This is important. Where to find support Talk with your health care provider about stress management or finding a support group. Find a therapist to work with you on your stress management techniques. Contact a health care provider if: Your stress symptoms get worse. You are unable to manage your stress at home. You are struggling to stop using drugs or alcohol. Get help right away if: You may be a danger to yourself or others. You have any thoughts of death or suicide. If you ever feel like you may hurt yourself or others, or have thoughts about taking your own life, get help right away. You can go to your nearest emergency department or call: Your local emergency services (911 in the [place] A suicide crisis helpline, such as the [place] at 343-055-5805. This is open 24 hours a day. Summary Feeling a certain amount of stress is normal, but severe or long-lasting (chronic) stress can affect your mental and physical health. Chronic stress can put you at higher risk for anxiety, depression, and other health problems like digestive problems, muscle aches, heart disease, high blood pressure, and stroke. You may be at higher risk for stress-related problems if you do not get enough sleep, are in poor health, lack emotional support, or have a mental health disorder like anxiety or depression. Identify the source of your stress and your reaction to it. Try talking about stressful events with family, friends, or co-workers, finding a coping method, or getting support from spiritual resources. If you need more help, talk with your health care provider about finding a support group or a mental health therapist. This information is not intended to replace advice given to you by your health care provider. Make sure  you discuss any questions you have with your healthcare provider. Document Revised: 01/14/2019 Document Reviewed: 01/14/2019 Elsevier Patient Education  [employer].

## 2021-01-23 NOTE — Unmapped (Signed)
Dermatology Note     Assessment and Plan:      Hidradenitis Suppurativa (HS): [place] stage 2 flaring some today  - Discussed the chronic, relapsing nature of this skin disease, characterized by recurring inflamed painful nodules with abscess and sinus formation, and scarring  - Explained to patient that usually early lesion can remit with medical treatment, but once sinus tracts are established treatment options are limited and surgery is the only definitive treatment; however, recurrence rate can be up to 25% even with surgery  - Continue Humira 80 mg weekly  - Continue Spironolactone 100 mg daily.   - Will consider Infliximab in the future if needed.  - Continue clobetasol 0.05% ointment applied to affected areas twice daily as needed.  Appropriate use and side effects discussed for spot treatment for up to 2 weeks.  - Continue Norco 5/325mg  for pain PRN q6 H    Intralesional Kenalog Procedure Note: After the patient was informed of risks (including atrophy and dyspigmentation), benefits and side effects of intralesional steroid injection, the patient elected to undergo injection and verbal consent was obtained. Skin was cleaned with alcohol and injected intralesionally into the sites (below). The patient tolerated the procedure well without complications and was instructed on post-procedure care.  Location(s): L buttocks, R inguinal  Number of sites treated: 2  Kenalog (triamcinolone) Concentration: 40 mg/ml   Volume: 0.3 ml total        The patient was advised to call for an appointment should any new, changing, or symptomatic lesions develop.     RTC:as scheduled in November  _________________________________________________________________      Chief Complaint     Follow-up of HS    HPI     Erica Norman is a 40 y.o. female who presents as a returning patient (last seen 08/09/2020) to [facility] for follow-up of hidradenitis suppurativa. At last visit, patient was treated with ILK and was to continue Humira 80 mg weekly, continue spironolactone 100 mg daily, start clobetasol 0.05% ointment BID for two weeks, and start norco 5/325 mg PRN for her HS. Today, patient reports the following:     Patient reports new flares on her right flank and left buttocks. She believes the drive to clinic has aggravated her buttocks, but feels some tenderness on her flank as well. She notes her right flank is worse than her left, and it is a stubborn spot that flares with her menstrual cycle. Patient believes her right hip is separate from other flares that were previously treated. Endorses taking 80 mg Humira per week, with stable improvement.      The patient denies any other new or changing lesions or areas of concern.     Pertinent Past Medical History     No history of skin cancer    Disease course:  Year when symptoms first noticed: Age 36   Year of diagnosis: Age 21  Who diagnosed you? Dermatologist  Location of first symptoms: axillae  Typical involved areas include: groin  Typical number of inflammatory lesions each month at baseline (from first visit): three to five  Disease triggers: stress, sweat and menstrual cycle    Are menstrual cycles irregular when not on birth control? No.  Current form of contraception: None  Effect of hormonal contraception on disease: n/a  Flaring with menstrual cycle (before, during, or after?): during  Difficulty becoming pregnant? n/a  Pregnancy complications? never pregnant    How many children? 0  Better or worse with pregnancy?  never pregnant.  If so, worse during a specific trimester? never pregnant      Prior treatments:  Topical: Clobetasol  Systemic: Keflex, Doxycyline, Humira (helpful in the past), Clindamycin  Past surgical procedures: Yes -bilateral wide excisions of the axilla healed well since 2008 or 2009.  She had more recent excisions in the bilateral inguinal folds in 2016-2018.  These have mostly healed well but she has some persistent areas in particular along the infra abdominal fold and one in the right inguinal fold  Past laser procedures: No    Family History:   Negative for melanoma    Past Medical History, Family History, Social History, Medication List, Allergies, and Problem List were reviewed in the rooming section of Epic.     ROS: Other than symptoms mentioned in the HPI, no fevers, chills, or other skin complaints    Physical Examination     Gen: Well-appearing patient, appropriate, interactive, in no acute distress  Skin: Examination of the scalp, face, neck, chest, back, abdomen, bilateral upper and lower extremities, hands, palms, soles, nails, buttocks, and external genitalia performed today and pertinent for:     location Abscess Inflamed nodule Non-inflamed nodule Draining sinus Non-draining Sinus Hurley BSA at site Color change  Inflamed induration Open skin surface  Tunnels   R axilla              L axilla              R inframammary              L inframammary              Intermammary              Pubic              R inguinal  1   1         R thigh              L inguinal              L thigh              Scrotum/Vulva              Perianal              R buttock              L buttock  1            Other (list)                            *0=none, 1=mild, 2=moderate, 3=severe    AN count (total sum of abscess and inflammatory nodule): 2  -sites not commented on demonstrate normal findings.     Scribe's Attestation: Elsie Stain, MD obtained and performed the history, physical exam and medical decision making elements that were entered into the chart.  Signed by Catalina Antigua, Scribe, on January 24, 2021 at 8:32 AM.    ----------------------------------------------------------------------------------------------------------------------  January 24, 2021 8:57 AM. Documentation assistance provided by the Scribe. I was present during the time the encounter was recorded. The information recorded by the Scribe was done at my direction and has been reviewed and validated by me.  ----------------------------------------------------------------------------------------------------------------------       (Approved Template 03/14/2020)

## 2021-01-24 ENCOUNTER — Ambulatory Visit: Payer: Medicaid Other

## 2021-01-24 ENCOUNTER — Ambulatory Visit: Admit: 2021-01-24 | Discharge: 2021-01-25 | Payer: BLUE CROSS/BLUE SHIELD

## 2021-01-24 DIAGNOSIS — Z79899 Other long term (current) drug therapy: Principal | ICD-10-CM

## 2021-01-24 DIAGNOSIS — L732 Hidradenitis suppurativa: Principal | ICD-10-CM

## 2021-01-24 NOTE — Unmapped (Signed)
Self-reported severity (0-5): 3  VAS pain today: not answered  VAS average pain for the last month: 8  Requiring pain medication? Yes.  If so, what type/frequency? Aleve, motrin, and hydrocodone/tylenol  How often in pain?  few times a day  Level of odor (0-5): 3  Level of itching (0-5): 3  Dressing changes needed for drainage:Once a day  How much drainage: some drainage  Flare in the last month (Y/N)? No.  How long ago was the last flare? in the last year  Developing new lesions? less than monthly  Number of inflammatory lesions montly: 1-3  DLQI: 12  Current treatment: hydrocodone, aleve, motrin, Humira, and tylenol     How helpful is the current treatment in managing the following aspects of your disease?  Not at all helpful Somewhat helpful Very helpful   Pain x     Decreasing length of flares   x   Decreasing new lesions  x x   Drainage      Decreasing frequency of flares  x    Decreasing severity of flares   x   Odor x

## 2021-01-24 NOTE — Unmapped (Signed)
Today we did a steroid injection (triamcinolone injection). You can remove the bandaid at any time.     What is triamcinolone injection?  Triamcinolone is a steroid that prevents the release of substances in the body that cause inflammation.  Triamcinolone injection is used to treat many different types of inflammatory conditions, including severe allergic reactions, skin disorders, severe colitis, inflammation of the joints or tendons, blood cell disorders, inflammatory eye disorders, lung disorders, and problems caused by low adrenal gland hormones.  Triamcinolone is also used to treat certain skin disorders caused by autoimmune conditions such as lupus, psoriasis, lichen planus, and others.  How is triamcinolone injection given?  Triamcinolone injection is given through a needle and can be injected into different areas of the body: into a muscle, into the space around a joint or tendon, or into a lesion on the skin. A healthcare provider will give you this injection.  What are the possible side effects of triamcinolone injection?  Get emergency medical help if you have signs of an allergic reaction: hives; difficult breathing; swelling of your face, lips, tongue, or throat.  Call your doctor at once if you have:  blurred vision, tunnel vision, eye pain, or seeing halos around lights;  unusual changes in mood or behavior;    Common side effects of triamcinolone injections into skin lesions may include:  Skin changes (acne, redness, bruising, discoloration of the skin);  Local thinning of the skin and underlying fat (atrophy), this tends to improve with time  increased local hair growth, or thinning hair;  a wound that is slow to heal    This is not a complete list of side effects and others may occur. Call your doctor for medical advice about side effects. You may report side effects to FDA at 1-800-FDA-1088.

## 2021-01-24 NOTE — Unmapped (Signed)
The [facility] has made a second and final attempt to reach this patient to refill the following medication:Humira.      We have left voicemails on the following phone numbers: [phone]  and have sent a MyChart message.    Dates contacted: 7/21 and 7/26  Last scheduled delivery: 6/27    The patient may be at risk of non-compliance with this medication. The patient should call the [facility] at [phone] (option [phone]) to refill medication.    Basir Niven D Administrator [facility] Specialty Technician

## 2021-01-26 ENCOUNTER — Other Ambulatory Visit: Payer: Self-pay

## 2021-01-26 ENCOUNTER — Encounter: Payer: Self-pay | Admitting: Dietician

## 2021-01-26 ENCOUNTER — Encounter: Payer: Medicaid Other | Attending: Family Medicine | Admitting: Dietician

## 2021-01-26 DIAGNOSIS — Z6841 Body Mass Index (BMI) 40.0 and over, adult: Secondary | ICD-10-CM | POA: Diagnosis not present

## 2021-01-26 NOTE — Progress Notes (Signed)
Medical Nutrition Therapy: Visit start time: 8264  end time: 1520  Assessment:  Diagnosis: obesity Medical history changes: no changes per patient Psychosocial issues/ stress concerns: none  Current weight: 239.0lbs Height: 5'4" BMI: 41.02 Medications, supplement changes: updated list in medical record  Progress and evaluation:  Patient reports losing down to about 235lbs in recent weeks, when weighing in am.  She reports significant water intake today, has been trying to increase.  Had 2 steroid injections 2 days ago for Hidradenitis Suppurativa lesions.  Continues to have migraines, most recently felt different and did not have cravings/ hunger afterwards Has been using nutrition app to scan foods and determine health value/ presence of any harmful substances in effort to reduce processed foods, which is at times a financial challenge with using SNAP benefits and also relying on pricier gluten free products.     Physical activity: trying to increase steps and taking stairs more when not with her mother. Less outdoor activity during humid weather  Dietary Intake:  Usual eating pattern includes 2-3 meals and 0-2 snacks per day. Dining out frequency: 1-3 meals per week.  Breakfast: 2 eggs with sm amt cheese or sausage; apple with peanut butter; Oui yogurt with fruit; cheerios with small amount milk Snack: none Lunch: 1 sandwich (down from 2) on GF bread + veg; sometimes leftovers Snack: Lays chips 4 cans in past week, but none since Supper: small portion meat + veg+ sometimes sm amount potato; oc french fries if eating out; occ gf pizza or GF mac and cheese; chili; planning meatloaf for brother's birthday Snack: sometimes popcorn or string cheese, sometimes dill pickles Beverages: water, black coffee, occasional diet soda ie ginger ale for heartburn  Nutrition Care Education: Topics covered:     Weight control: reviewed progress since previous visit, non-food factors that can influence  weight; portion control strategies; physical activity options   Nutritional Diagnosis:  [place]-2.1 Inpaired nutrition utilization and [place]-2.2 Altered nutrition-related laboratory As related to hidradenitis suppurativa triggers and hyperlipidemia.  As evidenced by patient reported HS symptoms, and eleveted total cholesterol and LDL. [place]-3.3 Overweight/obesity As related to eplilepsy treatments/ medications, limited physical activity, excess calories.  As evidenced by patient with current BMI of 41.  Intervention:  Instruction and discussion as noted above. Patient continues to make progress with healthy lifestyle habits; reassured patient that body weight is not always a reflection of healthy habits.  Goal/plan is to continue with gradual reduction in portions of high calorie foods and increase low calorie vegetables, and to continue to increase physical activity.   Education Materials given:  Visit summary with goals/ instructions to be viewed via MyChart   Learner/ who was taught:  Patient   Level of understanding: Verbalizes/ demonstrates competency   Demonstrated degree of understanding via:   Teach back Learning barriers: None  Willingness to learn/ readiness for change: Eager, change in progress   Monitoring and Evaluation:  Dietary intake, exercise, and body weight      follow up:  03/22/21 at 8:30am

## 2021-01-26 NOTE — Patient Instructions (Addendum)
Continue to work on controlling food portions; build up portions of low carb veggies to help with feeling full and control portions of meats and starchy foods.  Keep increasing physical activity gradually as able.

## 2021-02-04 ENCOUNTER — Telehealth: Payer: Medicaid Other | Admitting: Nurse Practitioner

## 2021-02-04 DIAGNOSIS — U071 COVID-19: Secondary | ICD-10-CM | POA: Diagnosis not present

## 2021-02-04 MED ORDER — NIRMATRELVIR/RITONAVIR (PAXLOVID)TABLET: 3.0000 | ORAL_TABLET | Freq: Two times a day (BID) | ORAL | 0 refills | Status: AC

## 2021-02-04 NOTE — Progress Notes (Signed)
Virtual Visit Consent   Destiny Flores, you are scheduled for a virtual visit with a [facility] provider today.     Just as with appointments in the office, your consent must be obtained to participate.  Your consent will be active for this visit and any virtual visit you may have with one of our providers in the next 365 days.     If you have a MyChart account, a copy of this consent can be sent to you electronically.  All virtual visits are billed to your insurance company just like a traditional visit in the office.    As this is a virtual visit, video technology does not allow for your provider to perform a traditional examination.  This may limit your provider's ability to fully assess your condition.  If your provider identifies any concerns that need to be evaluated in person or the need to arrange testing (such as labs, EKG, etc.), we will make arrangements to do so.     Although advances in technology are sophisticated, we cannot ensure that it will always work on either your end or our end.  If the connection with a video visit is poor, the visit may have to be switched to a telephone visit.  With either a video or telephone visit, we are not always able to ensure that we have a secure connection.     I need to obtain your verbal consent now.   Are you willing to proceed with your visit today?    [name] has provided verbal consent on 02/04/2021 for a virtual visit (video or telephone).   Viviano Simas, FNP   Date: 02/04/2021 9:44 AM   Virtual Visit via Video Note   I, Viviano Simas, connected with  Palmyra Rogacki  ([id number], 10-19-1980) on 02/04/21 at  9:45 AM EDT by a video-enabled telemedicine application and verified that I am speaking with the correct person using two identifiers.  Location: Patient: Virtual Visit Location Patient: Home Provider: Virtual Visit Location Provider: Office/Clinic   I discussed the limitations of evaluation and management by telemedicine and  the availability of in person appointments. The patient expressed understanding and agreed to proceed.    History of Present Illness: Destiny Flores is a 40 y.o. who identifies as a female who was assigned female at birth, and is being seen today after testing positive for COVID-19.   She woke up this am with eye pain, fever of 101 and body aches. She took a home COVID-19 test and was positive. She feels body aches all over.   She has a history of a seizure disorder that is triggered by fever.  She has taken tylenol and motrin already this am and fever remains at 100.   She has not had COVID in the past.  She has been vaccinated for COVID x3 for Moderna    She denies a history of asthma  Last dose of Humira was 2 weeks ago.  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  Has  patient had a PCN reaction causing immediate rash, facial/tongue/throat swelling, SOB or lightheadedness with hypotension: Yes Has patient had a PCN reaction causing severe rash involving mucus membranes or skin necrosis: No Has patient had a PCN reaction that required hospitalization: Yes Has patient had a PCN reaction occurring within the last 10 years: No If all of the above answers are "NO", then may proceed with Cephalosporin use. Has patient had a PCN reaction causing immediate rash, facial/tongue/throat swelling, SOB or lightheadedness with hypotension: Yes Has patient had a PCN reaction causing severe rash involving mucus membranes or skin necrosis: No Has patient had a PCN reaction that required hospitalization: Yes Has patient had a PCN reaction occurring within the last 10 years: No If all of the above answers are "NO", then may proceed with Cephalosporin use.   Sulfa Antibiotics Hives   Tape Other (See Comments)    Large Boils-PAPER TAPE OK TO USE Large Boils-PAPER TAPE OK TO USE   Medications:  Current Outpatient Medications:    acetaminophen (TYLENOL) 500 MG tablet, Take 1,000 mg by mouth every 6 (six) hours as needed for moderate pain or headache., Disp: , Rfl:    Adalimumab (HUMIRA PEN-CD/UC/HS STARTER) 80 MG/0.8ML PNKT, Inject the contents of 1 pen (80mg ) under the skin once weekly., Disp: , Rfl:    ascorbic acid (VITAMIN C) 1000 MG tablet, Take by mouth., Disp: , Rfl:    aspirin-acetaminophen-caffeine (EXCEDRIN MIGRAINE) 250-250-65 MG tablet, Take 2 tablets by mouth every 6 (six) hours as needed for headache or migraine., Disp: , Rfl:    Brivaracetam (BRIVIACT) 100 MG TABS, Take 1 tablet by mouth 2 (two) times daily., Disp: , Rfl:    budesonide (RHINOCORT AQUA) 32 MCG/ACT nasal spray, Place into the nose., Disp: , Rfl:    clobetasol ointment (TEMOVATE) 0.05 %, Apply topically 2 (two) times daily., Disp: , Rfl:    felbamate (FELBATOL) 600 MG tablet, Take 600 mg by mouth 3  (three) times daily., Disp: , Rfl:    fexofenadine (ALLEGRA) 180 MG tablet, Take by mouth., Disp: , Rfl:    HYDROcodone-acetaminophen (NORCO/VICODIN) 5-325 MG tablet, Take 1 tablet by mouth every 6 (six) hours as needed., Disp: , Rfl:    magnesium oxide (MAG-OX) 400 MG tablet, Take by mouth., Disp: , Rfl:    Melatonin 3 MG CAPS, Take by mouth., Disp: , Rfl:    montelukast (SINGULAIR) 10 MG tablet, Take 10 mg by mouth daily., Disp: , Rfl:    pantoprazole (PROTONIX) 40 MG tablet, 20 mg., Disp: , Rfl:    pyridOXINE (VITAMIN B-6) 100 MG tablet, Take 100 mg by mouth daily., Disp: , Rfl:    spironolactone (ALDACTONE) 100 MG tablet, Take by mouth., Disp: , Rfl:    SUMAtriptan (IMITREX) 50 MG tablet, Take 50 mg by mouth daily as needed., Disp: , Rfl:    TURMERIC CURCUMIN PO, Take 2 capsules by mouth daily.,  Disp: , Rfl:   Observations/Objective: Patient is well-developed, well-nourished in no acute distress.  Resting comfortably at home.  Head is normocephalic, atraumatic.  No labored breathing.  Speech is clear and coherent with logical content.  Patient is alert and oriented at baseline.    Assessment and Plan: 1. COVID-19  - nirmatrelvir/ritonavir EUA (PAXLOVID) TABS; Take 3 tablets by mouth 2 (two) times daily for 5 days. (Take nirmatrelvir 150 mg two tablets twice daily for 5 days and ritonavir 100 mg one tablet twice daily for 5 days) Patient GFR is 114  Dispense: 30 tablet; Refill: 0    Rest, push fluids and assure adequate caloric intake  Continue to manage symptoms with over the counter medications.  Follow Up Instructions: I discussed the assessment and treatment plan with the patient. The patient was provided an opportunity to ask questions and all were answered. The patient agreed with the plan and demonstrated an understanding of the instructions.  A copy of instructions were sent to the patient via MyChart.  The patient was advised to call back or seek an in-person evaluation if  the symptoms worsen or if the condition fails to improve as anticipated.  Time:  I spent 20 minutes with the patient via telehealth technology discussing the above problems/concerns.    Viviano Simas, FNP

## 2021-02-04 NOTE — Patient Instructions (Signed)
You are being prescribed PAXLOVID for COVID-19 infection.   Please discuss all medications you are taking and Paxlovid with your pharmacist at the time of medication pick up    ADMINISTRATION INSTRUCTIONS: Take with or without food. Swallow the tablets whole. Don't chew, crush, or break the medications because it might not work as well  For each dose of the medication, you should be taking 3 tablets together  TWICE a day for FIVE days   Finish your full five-day course of Paxlovid even if you feel better before you're done. Stopping this medication too early can make it less effective to prevent severe illness related to COVID19.    Paxlovid is prescribed for YOU ONLY. Don't share it with others, even if they have similar symptoms as you. This medication might not be right for everyone.  Make sure to take steps to protect yourself and others while you're taking this medication in order to get well soon and to prevent others from getting sick with COVID-19.  Paxlovid (nirmatrelvir / ritonavir) can cause hormonal birth control medications to not work well. If you or your partner is currently taking hormonal birth control, use condoms or other birth control methods to prevent unintended pregnancies.    COMMON SIDE EFFECTS: Altered or bad taste in your mouth  Diarrhea  High blood pressure (1% of people) Muscle aches (1% of people)     If your COVID-19 symptoms get worse, get medical help right away. Call 911 if you experience symptoms such as worsening cough, trouble breathing, chest pain that doesn't go away, confusion, a hard time staying awake, and pale or blue-colored skin. This medication won't prevent all COVID-19 cases from getting worse.

## 2021-02-06 ENCOUNTER — Encounter: Payer: Self-pay | Admitting: Family Medicine

## 2021-02-14 ENCOUNTER — Ambulatory Visit: Payer: Medicaid Other

## 2021-02-15 ENCOUNTER — Ambulatory Visit: Payer: Medicaid Other | Admitting: Family Medicine

## 2021-02-16 ENCOUNTER — Telehealth: Payer: Self-pay

## 2021-02-16 NOTE — Telephone Encounter (Signed)
Copied from CRM [id number]. Topic: Referral - Question >> Feb 16, 2021 11:34 AM Pawlus, Maxine Glenn A wrote: Reason for CRM: [facility] called in regarding the referral sent over, caller stated they do not schedule appts, it is walk in only. Please advise.

## 2021-02-17 NOTE — Telephone Encounter (Signed)
Left detailed vm to patient to walk in

## 2021-02-24 NOTE — Unmapped (Signed)
Erica Norman is restarting Humira after missing a few weeks due to COVID, and she's had some HS flares. Her provider recommended she give 2 injections on weeks 1 and 2 before resuming normal maintenance dosing.     [facility] Clinical Assessment & Refill Coordination Note    Erica Norman, DOB: Aug 23, 1980  Phone: [phone] (home)     All above HIPAA information was verified with patient.     Was a Nurse, learning disability used for this call? No    Specialty Medication(s):   Inflammatory Disorders: Humira     Current Outpatient Medications   Medication Sig Dispense Refill   ??? adalimumab (HUMIRA,CF, PEN) 80 mg/0.8 mL PnKt Inject the contents of 1 pen (80mg ) under the skin once weekly. 4 each 11   ??? ascorbic acid, vitamin C, (VITAMIN C) 1000 MG tablet Take 1,000 mg by mouth.     ??? brivaracetam (BRIVIACT) 50 mg tablet Take 50 mg by mouth.     ??? clobetasoL (TEMOVATE) 0.05 % ointment Apply topically Two (2) times a day. 60 g 1   ??? FELBATOL 600 mg tablet      ??? HYDROcodone-acetaminophen (NORCO) 5-325 mg per tablet Take 1-2 tablets by mouth every six (6) hours as needed for pain. 15 tablet 0   ??? magnesium oxide (MAG-OX) 400 mg (241.3 mg magnesium) tablet Take 400 mg by mouth daily.     ??? melatonin 5 mg tablet Take 7.5 mg by mouth.     ??? montelukast (SINGULAIR) 10 mg tablet Take 10 mg by mouth every evening.     ??? pantoprazole (PROTONIX) 40 MG tablet 20 mg.     ??? sodium chloride (AYR) 0.65 % Drop 1 spray into each nostril.     ??? spironolactone (ALDACTONE) 100 MG tablet Take 1 tablet (100 mg total) by mouth daily. 90 tablet 3   ??? turmeric, bulk, 95 % Powd Take 2 capsules by mouth.       No current facility-administered medications for this visit.        Changes to medications: Ruhi reports no changes at this time.    Allergies   Allergen Reactions   ??? Codeine Anaphylaxis   ??? Penicillins Anaphylaxis and Other (See Comments)     Has patient had a PCN reaction causing immediate rash, facial/tongue/throat swelling, SOB or lightheadedness with hypotension: Yes  Has patient had a PCN reaction causing severe rash involving mucus membranes or skin necrosis: No  Has patient had a PCN reaction that required hospitalization: Yes  Has patient had a PCN reaction occurring within the last 10 years: No  If all of the above answers are NO, then may proceed with Cephalosporin use.  Has patient had a PCN reaction causing immediate rash, facial/tongue/throat swelling, SOB or lightheadedness with hypotension: Yes  Has patient had a PCN reaction causing severe rash involving mucus membranes or skin necrosis: No  Has patient had a PCN reaction that required hospitalization: Yes  Has patient had a PCN reaction occurring within the last 10 years: No  If all of the above answers are NO, then may proceed with Cephalosporin use.     ??? Sulfa (Sulfonamide Antibiotics) Hives   ??? Adhesive Tape-Silicones Other (See Comments)     Large Boils-PAPER TAPE OK TO USE       Changes to allergies: No    SPECIALTY MEDICATION ADHERENCE     Humira - unclear, has 1 from her supply, but then some from brother who also  took 80 mg.   Medication Adherence    Patient reported X missed doses in the last month: all  Specialty Medication: Humira          Specialty medication(s) dose(s) confirmed: Regimen is correct and unchanged.     Are there any concerns with adherence? Yes: missed multiple weeks, had COVID - unclear if all of these weeks were appropriate to skip, but provider aware    Adherence counseling provided? Not needed    CLINICAL MANAGEMENT AND INTERVENTION      Clinical Benefit Assessment:    Do you feel the medicine is effective or helping your condition? Yes    Clinical Benefit counseling provided? Not needed    Adverse Effects Assessment:    Are you experiencing any side effects? No    Are you experiencing difficulty administering your medicine? No    Quality of Life Assessment:    Quality of Life    Rheumatology  Oncology  Dermatology  1. What impact has your specialty medication had on the symptoms of your skin condition (i.e. itchiness, soreness, stinging)?: Some  2. What impact has your specialty medication had on your comfort level with your skin?: Some  Cystic Fibrosis        Have you discussed this with your provider? Yes    Acute Infection Status:    Acute infections noted within Epic:  No active infections  Patient reported infection: None    Therapy Appropriateness:    Is therapy appropriate? Yes, therapy is appropriate and should be continued    DISEASE/MEDICATION-SPECIFIC INFORMATION      For patients on injectable medications: Patient currently has unclear  doses left.  Next injection is scheduled for plans to start 8/27 with 2 injections, then 9/2 with 2 pens .    PATIENT SPECIFIC NEEDS     - Does the patient have any physical, cognitive, or cultural barriers? No    - Is the patient high risk? No    - Does the patient require a Care Management Plan? No     - Does the patient require physician intervention or other additional services (i.e. nutrition, smoking cessation, social work)? No      SHIPPING     Specialty Medication(s) to be Shipped:   Inflammatory Disorders: Humira    Other medication(s) to be shipped: No additional medications requested for fill at this time     Changes to insurance: No    Delivery Scheduled: Yes, Expected medication delivery date: Monday, 8/29.     Medication will be delivered via Same Day Courier to the confirmed prescription address in [facility].    The patient will receive a drug information handout for each medication shipped and additional FDA Medication Guides as required.  Verified that patient has previously received a Conservation officer, historic buildings and a Surveyor, mining.    The patient or caregiver noted above participated in the development of this care plan and knows that they can request review of or adjustments to the care plan at any time.      All of the patient's questions and concerns have been addressed.    Lanney Gins   [facility] Specialty Pharmacist

## 2021-02-26 ENCOUNTER — Telehealth: Payer: Medicaid Other | Admitting: Nurse Practitioner

## 2021-02-26 DIAGNOSIS — U099 Post covid-19 condition, unspecified: Secondary | ICD-10-CM | POA: Diagnosis not present

## 2021-02-26 DIAGNOSIS — R059 Cough, unspecified: Secondary | ICD-10-CM

## 2021-02-26 MED ORDER — FLUTICASONE PROPIONATE 50 MCG/ACT NA SUSP: 2.0000 | Freq: Every day | NASAL | 0 refills | Status: DC

## 2021-02-26 MED ORDER — PROMETHAZINE-DM 6.25-15 MG/5ML PO SYRP: 5.0000 mL | ORAL_SOLUTION | Freq: Every evening | ORAL | 0 refills | Status: AC | PRN

## 2021-02-26 MED ORDER — ALBUTEROL SULFATE HFA 108 (90 BASE) MCG/ACT IN AERS: 1.0000 | INHALATION_SPRAY | Freq: Four times a day (QID) | RESPIRATORY_TRACT | 0 refills | Status: DC | PRN

## 2021-02-26 NOTE — Patient Instructions (Signed)
[name], thank you for joining [name], NP for today's virtual visit.  While this provider is not your primary care provider (PCP), if your PCP is located in our provider database this encounter information will be shared with them immediately following your visit.  Consent: (Patient) Destiny Flores provided verbal consent for this virtual visit at the beginning of the encounter.  Current Medications:  Current Outpatient Medications:    acetaminophen (TYLENOL) 500 MG tablet, Take 1,000 mg by mouth every 6 (six) hours as needed for moderate pain or headache., Disp: , Rfl:    Adalimumab (HUMIRA PEN-CD/UC/HS STARTER) 80 MG/0.8ML PNKT, Inject the contents of 1 pen (80mg ) under the skin once weekly., Disp: , Rfl:    albuterol (VENTOLIN HFA) 108 (90 Base) MCG/ACT inhaler, Inhale 1-2 puffs into the lungs every 6 (six) hours as needed for up to 10 days for wheezing or shortness of breath., Disp: 8 g, Rfl: 0   ascorbic acid (VITAMIN C) 1000 MG tablet, Take by mouth., Disp: , Rfl:    aspirin-acetaminophen-caffeine (EXCEDRIN MIGRAINE) 250-250-65 MG tablet, Take 2 tablets by mouth every 6 (six) hours as needed for headache or migraine., Disp: , Rfl:    Brivaracetam (BRIVIACT) 100 MG TABS, Take 1 tablet by mouth 2 (two) times daily., Disp: , Rfl:    clobetasol ointment (TEMOVATE) 0.05 %, Apply topically 2 (two) times daily., Disp: , Rfl:    felbamate (FELBATOL) 600 MG tablet, Take 600 mg by mouth 3 (three) times daily., Disp: , Rfl:    fexofenadine (ALLEGRA) 180 MG tablet, Take by mouth., Disp: , Rfl:    fluticasone (FLONASE) 50 MCG/ACT nasal spray, Place 2 sprays into both nostrils daily for 10 days., Disp: 16 g, Rfl: 0   HYDROcodone-acetaminophen (NORCO/VICODIN) 5-325 MG tablet, Take 1 tablet by mouth every 6 (six) hours as needed., Disp: , Rfl:    magnesium oxide (MAG-OX) 400 MG tablet, Take by mouth., Disp: , Rfl:    Melatonin 3 MG CAPS, Take by mouth., Disp: , Rfl:    montelukast  (SINGULAIR) 10 MG tablet, Take 10 mg by mouth daily., Disp: , Rfl:    pantoprazole (PROTONIX) 40 MG tablet, 20 mg., Disp: , Rfl:    promethazine-dextromethorphan (PROMETHAZINE-DM) 6.25-15 MG/5ML syrup, Take 5 mLs by mouth at bedtime as needed for up to 10 days for cough., Disp: 75 mL, Rfl: 0   pyridOXINE (VITAMIN B-6) 100 MG tablet, Take 100 mg by mouth daily., Disp: , Rfl:    spironolactone (ALDACTONE) 100 MG tablet, Take by mouth., Disp: , Rfl:    SUMAtriptan (IMITREX) 50 MG tablet, Take 50 mg by mouth daily as needed., Disp: , Rfl:    TURMERIC CURCUMIN PO, Take 2 capsules by mouth daily., Disp: , Rfl:    budesonide (RHINOCORT AQUA) 32 MCG/ACT nasal spray, Place into the nose. (Patient not taking: Reported on 02/26/2021), Disp: , Rfl:    Medications ordered in this encounter:  Meds ordered this encounter  Medications   promethazine-dextromethorphan (PROMETHAZINE-DM) 6.25-15 MG/5ML syrup    Sig: Take 5 mLs by mouth at bedtime as needed for up to 10 days for cough.    Dispense:  75 mL    Refill:  0   fluticasone (FLONASE) 50 MCG/ACT nasal spray    Sig: Place 2 sprays into both nostrils daily for 10 days.    Dispense:  16 g    Refill:  0   albuterol (VENTOLIN HFA) 108 (90 Base) MCG/ACT inhaler    Sig: Inhale  1-2 puffs into the lungs every 6 (six) hours as needed for up to 10 days for wheezing or shortness of breath.    Dispense:  8 g    Refill:  0     *If you need refills on other medications prior to your next appointment, please contact your pharmacy*  Follow-Up: Call back or seek an in-person evaluation if the symptoms worsen or if the condition fails to improve as anticipated.  Other Instructions Symptoms consistent with long COVID. Will provide symptomatic treatment as this time. Patient does not complain of symptoms consistent with PNA to include fever, purulent drainage, wheezing. Encouraged increasing fluid intake, get plenty of rest. Instructions provided regarding medication  dosing, use. She will follow up within 3-5 days or sooner if symptoms worsen or do not improve. Follow up with PCP as needed.   If you have been instructed to have an in-person evaluation today at a local Urgent Care facility, please use the link below. It will take you to a list of all of our available [facility], including address, phone number and hours of operation. Please do not delay care.  [facility]  If you or a family member do not have a primary care provider, use the link below to schedule a visit and establish care. When you choose a [facility] primary care physician or advanced practice provider, you gain a long-term partner in health. Find a Primary Care Provider  Learn more about [facility] in-office and virtual care options:  - Get Care Now

## 2021-02-26 NOTE — Progress Notes (Signed)
Virtual Visit Consent   Destiny Flores, you are scheduled for a virtual visit with a [name] provider today.     Just as with appointments in the office, your consent must be obtained to participate.  Your consent will be active for this visit and any virtual visit you may have with one of our providers in the next 365 days.     If you have a MyChart account, a copy of this consent can be sent to you electronically.  All virtual visits are billed to your insurance company just like a traditional visit in the office.    As this is a virtual visit, video technology does not allow for your provider to perform a traditional examination.  This may limit your provider's ability to fully assess your condition.  If your provider identifies any concerns that need to be evaluated in person or the need to arrange testing (such as labs, EKG, etc.), we will make arrangements to do so.     Although advances in technology are sophisticated, we cannot ensure that it will always work on either your end or our end.  If the connection with a video visit is poor, the visit may have to be switched to a telephone visit.  With either a video or telephone visit, we are not always able to ensure that we have a secure connection.     I need to obtain your verbal consent now.   Are you willing to proceed with your visit today? Yes   [name] has provided verbal consent on 02/26/2021 for a virtual visit (video or telephone).   [name], NP   Date: 02/26/2021 1:15 PM   Virtual Visit via Video Note   I, [name], connected with  Destiny Flores  ([id number], 05-Jan-1981) on 02/26/21 at  1:15 PM EDT by a video-enabled telemedicine application and verified that I am speaking with the correct person using two identifiers.  Location: Patient: Virtual Visit Location Patient: Home, [name] Provider: Virtual Visit Location Provider: Other: [name], FNP-C   I discussed the limitations of evaluation  and management by telemedicine and the availability of in person appointments. The patient expressed understanding and agreed to proceed.    History of Present Illness: [name] is a 40 y.o. who identifies as a female who was assigned female at birth, and is being seen today for continued cough and mucous. Patient tested positive on 8/6, still tested positive on 8/16. She has developed mucous and cough approximately 3 days ago. Has SOB and wheezing with activity. Sputum is clear to yellow. Cough is worse in the mornings and at night. Denies fever, chills, HA, ear pain, sore throat, GI symptoms. Has been taking OTC mucinex. Has been vaccinated with 1 booster. Took antiviral for COVID.  HPI: HPI  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/10/2018   Vitamin D deficiency 03/06/2018   Cerebral palsy (HCC) 12/24/2017   Epilepsy (HCC) 12/24/2017   Hidradenitis suppurativa 12/24/2017   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    Allergies:  Allergies  Allergen Reactions   Codeine Anaphylaxis   Penicillins Anaphylaxis and Other (See Comments)  Has patient had a PCN reaction causing immediate rash, facial/tongue/throat swelling, SOB or lightheadedness with hypotension: Yes Has patient had a PCN reaction causing severe rash involving mucus membranes or skin necrosis: No Has patient had a PCN reaction that required hospitalization: Yes Has patient had a PCN reaction occurring within the last 10 years: No If all of the above answers are "NO", then may proceed with Cephalosporin use.  Has patient had a PCN reaction causing  immediate rash, facial/tongue/throat swelling, SOB or lightheadedness with hypotension: Yes Has patient had a PCN reaction causing severe rash involving mucus membranes or skin necrosis: No Has patient had a PCN reaction that required hospitalization: Yes Has patient had a PCN reaction occurring within the last 10 years: No If all of the above answers are "NO", then may proceed with Cephalosporin use. Has patient had a PCN reaction causing immediate rash, facial/tongue/throat swelling, SOB or lightheadedness with hypotension: Yes Has patient had a PCN reaction causing severe rash involving mucus membranes or skin necrosis: No Has patient had a PCN reaction that required hospitalization: Yes Has patient had a PCN reaction occurring within the last 10 years: No If all of the above answers are "NO", then may proceed with Cephalosporin use.   Sulfa Antibiotics Hives   Tape Other (See Comments)    Large Boils-PAPER TAPE OK TO USE Large Boils-PAPER TAPE OK TO USE   Medications:  Current Outpatient Medications:    acetaminophen (TYLENOL) 500 MG tablet, Take 1,000 mg by mouth every 6 (six) hours as needed for moderate pain or headache., Disp: , Rfl:    Adalimumab (HUMIRA PEN-CD/UC/HS STARTER) 80 MG/0.8ML PNKT, Inject the contents of 1 pen (80mg ) under the skin once weekly., Disp: , Rfl:    ascorbic acid (VITAMIN C) 1000 MG tablet, Take by mouth., Disp: , Rfl:    aspirin-acetaminophen-caffeine (EXCEDRIN MIGRAINE) 250-250-65 MG tablet, Take 2 tablets by mouth every 6 (six) hours as needed for headache or migraine., Disp: , Rfl:    Brivaracetam (BRIVIACT) 100 MG TABS, Take 1 tablet by mouth 2 (two) times daily., Disp: , Rfl:    budesonide (RHINOCORT AQUA) 32 MCG/ACT nasal spray, Place into the nose., Disp: , Rfl:    clobetasol ointment (TEMOVATE) 0.05 %, Apply topically 2 (two) times daily., Disp: , Rfl:    felbamate (FELBATOL) 600 MG tablet, Take 600 mg by mouth 3 (three) times daily., Disp: , Rfl:     fexofenadine (ALLEGRA) 180 MG tablet, Take by mouth., Disp: , Rfl:    HYDROcodone-acetaminophen (NORCO/VICODIN) 5-325 MG tablet, Take 1 tablet by mouth every 6 (six) hours as needed., Disp: , Rfl:    magnesium oxide (MAG-OX) 400 MG tablet, Take by mouth., Disp: , Rfl:    Melatonin 3 MG CAPS, Take by mouth., Disp: , Rfl:    montelukast (SINGULAIR) 10 MG tablet, Take 10 mg by mouth daily., Disp: , Rfl:    pantoprazole (PROTONIX) 40 MG tablet, 20 mg., Disp: , Rfl:    pyridOXINE (VITAMIN B-6) 100 MG tablet, Take 100 mg by mouth daily., Disp: , Rfl:    spironolactone (ALDACTONE) 100 MG tablet, Take by mouth., Disp: , Rfl:    SUMAtriptan (IMITREX) 50 MG tablet, Take 50 mg by mouth daily as needed., Disp: , Rfl:    TURMERIC CURCUMIN PO, Take 2 capsules by mouth daily., Disp: , Rfl:   Observations/Objective: Patient is well-developed, well-nourished in no acute distress.  Resting comfortably at home.  Head is normocephalic, atraumatic.  No labored breathing.  Cough observed during visit. Speech is clear and coherent with logical content.  Patient is alert and oriented at baseline.    Assessment and Plan: 1. COVID-19 long hauler - promethazine-dextromethorphan (PROMETHAZINE-DM) 6.25-15 MG/5ML syrup; Take 5 mLs by mouth at bedtime as needed for up to 10 days for cough.  Dispense: 75 mL; Refill: 0 - fluticasone (FLONASE) 50 MCG/ACT nasal spray; Place 2 sprays into both nostrils daily for 10 days.  Dispense: 16 g; Refill: 0 - albuterol (VENTOLIN HFA) 108 (90 Base) MCG/ACT inhaler; Inhale 1-2 puffs into the lungs every 6 (six) hours as needed for up to 10 days for wheezing or shortness of breath.  Dispense: 8 g; Refill: 0  2. Cough - promethazine-dextromethorphan (PROMETHAZINE-DM) 6.25-15 MG/5ML syrup; Take 5 mLs by mouth at bedtime as needed for up to 10 days for cough.  Dispense: 75 mL; Refill: 0 - fluticasone (FLONASE) 50 MCG/ACT nasal spray; Place 2 sprays into both nostrils daily for 10 days.   Dispense: 16 g; Refill: 0 - albuterol (VENTOLIN HFA) 108 (90 Base) MCG/ACT inhaler; Inhale 1-2 puffs into the lungs every 6 (six) hours as needed for up to 10 days for wheezing or shortness of breath.  Dispense: 8 g; Refill: 0  1,2. Symptoms consistent with long COVID. Will provide symptomatic treatment as this time. Patient does not complain of symptoms consistent with PNA to include fever, purulent drainage, wheezing. Encouraged increasing fluid intake, get plenty of rest. Instructions provided regarding medication dosing, use. She will follow up within 3-5 days or sooner if symptoms worsen or do not improve. Follow up with PCP as needed.  Follow Up Instructions: I discussed the assessment and treatment plan with the patient. The patient was provided an opportunity to ask questions and all were answered. The patient agreed with the plan and demonstrated an understanding of the instructions.  A copy of instructions were sent to the patient via MyChart.  The patient was advised to call back or seek an in-person evaluation if the symptoms worsen or if the condition fails to improve as anticipated.  Time:  I spent 20 minutes with the patient via telehealth technology discussing the above problems/concerns.    [name], NP

## 2021-02-27 MED FILL — HUMIRA(CF) PEN 80 MG/0.8 ML SUBCUTANEOUS KIT: 28 days supply | Qty: 4 | Fill #7

## 2021-02-28 ENCOUNTER — Ambulatory Visit: Payer: Self-pay | Admitting: *Deleted

## 2021-02-28 ENCOUNTER — Telehealth (INDEPENDENT_AMBULATORY_CARE_PROVIDER_SITE_OTHER): Payer: Medicaid Other | Admitting: Nurse Practitioner

## 2021-02-28 VITALS — Temp 97.0°F | Ht 65.0 in | Wt 234.0 lb

## 2021-02-28 DIAGNOSIS — R059 Cough, unspecified: Secondary | ICD-10-CM | POA: Diagnosis not present

## 2021-02-28 DIAGNOSIS — Z8616 Personal history of COVID-19: Secondary | ICD-10-CM | POA: Diagnosis not present

## 2021-02-28 DIAGNOSIS — R0981 Nasal congestion: Secondary | ICD-10-CM

## 2021-02-28 MED ORDER — AZELASTINE HCL 0.1 % NA SOLN: 2.0000 | NASAL | 0 refills | Status: DC

## 2021-02-28 MED ORDER — AZELASTINE HCL 0.1 % NA SOLN: 2.0000 | NASAL | Status: DC

## 2021-02-28 NOTE — Addendum Note (Signed)
Addended by: Della Goo F on: 02/28/2021 03:21 PM   Modules accepted: Orders

## 2021-02-28 NOTE — Progress Notes (Addendum)
Name: Destiny Flores   MRN: [id number]    DOB: 10-25-1980   Date:02/28/2021       Progress Note  Subjective  Chief Complaint  Chief Complaint  Patient presents with   Nasal Congestion    Pt tested positive for COVID 8/6. Pt has not had any fever, has experienced some SOB. All symptoms started 8/27   Cough    Pt states been coughing up phlegm either green or yellow.    I connected with  [name]  on 02/28/21 at  2:40 PM EDT by a video enabled telemedicine application and verified that I am speaking with the correct person using two identifiers.  I discussed the limitations of evaluation and management by telemedicine and the availability of in person appointments. The patient expressed understanding and agreed to proceed with a virtual visit  Staff also discussed with the patient that there may be a patient responsible charge related to this service. Patient Location: home Provider Location: [facility] Additional Individuals present: alone  HPI  History of Covid -19:  She had Covid-19 August 6,2022.  She took Paxlovid and she says she got better.  She said she then had a rebound of covid symptoms.  She said she was starting to feel better and then this weekend the symptoms seemed to increase.  She had a virtual on Sunday with the [facility] and says she is feeling a little better.   Nasal congestion:  She reports that she started having some nasal congestion this weekend.  She had a virtual visit on Sunday with covid clinic.  They prescribed Flonase, Albuterol and promethazine-dextromethorphan.  She says she is pushing fluids.  She says she has clear nasal drainage.  We will add a Astelin nasal spray in the am, to her current treatment plan.  Cough: She reports she still has a cough.  She says the inhaler helps and she feels a little better.  She also says the cough medication is helping.  She denies any fever or increased shortness of breath.  Discussed with patient to continue current  treatment plan and push fluids.     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/17/2020   Thrombocytosis 05/17/2020   Hyperlipidemia 05/17/2020   Gastroesophageal reflux disease without esophagitis 05/17/2020   Chronic sinusitis 05/17/2020   Morbid obesity with BMI of 40.0-44.9, adult (HCC) 07/14/2019   Developmental delay, mild 03/10/2018   Vitamin D deficiency 03/06/2018   Cerebral palsy (HCC) 12/24/2017   Epilepsy (HCC) 12/24/2017   Hidradenitis suppurativa 12/24/2017   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    Social History   Tobacco Use   Smoking status: Never   Smokeless tobacco: Never  Substance Use Topics   Alcohol use: Never     Current Outpatient Medications:    acetaminophen (TYLENOL) 500 MG tablet, Take 1,000 mg by mouth every 6 (six) hours as needed for moderate pain or headache., Disp: , Rfl:    Adalimumab (HUMIRA PEN-CD/UC/HS STARTER) 80 MG/0.8ML PNKT, Inject the contents of 1 pen (80mg ) under the skin once weekly., Disp: , Rfl:    albuterol (VENTOLIN HFA) 108 (90 Base) MCG/ACT inhaler, Inhale 1-2 puffs into the lungs every 6 (six) hours as needed for up to 10 days for wheezing or shortness of breath., Disp: 8 g, Rfl: 0   ascorbic acid (VITAMIN C) 1000 MG tablet, Take by mouth., Disp: ,  Rfl:    aspirin-acetaminophen-caffeine (EXCEDRIN MIGRAINE) 250-250-65 MG tablet, Take 2 tablets by mouth every 6 (six) hours as needed for headache or migraine., Disp: , Rfl:    Brivaracetam (BRIVIACT) 100 MG TABS, Take 1 tablet by mouth 2 (two) times daily., Disp: , Rfl:    clobetasol ointment (TEMOVATE) 0.05 %, Apply topically 2 (two) times daily., Disp: , Rfl:    felbamate (FELBATOL) 600 MG tablet, Take 600 mg by mouth 3 (three) times daily., Disp: , Rfl:    fexofenadine (ALLEGRA) 180 MG tablet, Take by mouth., Disp: , Rfl:    fluticasone (FLONASE) 50 MCG/ACT nasal  spray, Place 2 sprays into both nostrils daily for 10 days., Disp: 16 g, Rfl: 0   HYDROcodone-acetaminophen (NORCO/VICODIN) 5-325 MG tablet, Take 1 tablet by mouth every 6 (six) hours as needed., Disp: , Rfl:    magnesium oxide (MAG-OX) 400 MG tablet, Take by mouth., Disp: , Rfl:    Melatonin 3 MG CAPS, Take by mouth., Disp: , Rfl:    montelukast (SINGULAIR) 10 MG tablet, Take 10 mg by mouth daily., Disp: , Rfl:    pantoprazole (PROTONIX) 40 MG tablet, 20 mg., Disp: , Rfl:    promethazine-dextromethorphan (PROMETHAZINE-DM) 6.25-15 MG/5ML syrup, Take 5 mLs by mouth at bedtime as needed for up to 10 days for cough., Disp: 75 mL, Rfl: 0   pyridOXINE (VITAMIN B-6) 100 MG tablet, Take 100 mg by mouth daily., Disp: , Rfl:    spironolactone (ALDACTONE) 100 MG tablet, Take by mouth., Disp: , Rfl:    SUMAtriptan (IMITREX) 50 MG tablet, Take 50 mg by mouth daily as needed., Disp: , Rfl:    TURMERIC CURCUMIN PO, Take 2 capsules by mouth daily., Disp: , Rfl:    budesonide (RHINOCORT AQUA) 32 MCG/ACT nasal spray, Place into the nose. (Patient not taking: No sig reported), Disp: , Rfl:   Current Facility-Administered Medications:    [START ON 03/01/2021] azelastine (ASTELIN) 0.1 % nasal spray 2 spray, 2 spray, Each Nare, [id number], Berniece Salines, FNP  Allergies  Allergen Reactions   Codeine Anaphylaxis   Penicillins Anaphylaxis and Other (See Comments)    Has patient had a PCN reaction causing immediate rash, facial/tongue/throat swelling, SOB or lightheadedness with hypotension: Yes Has patient had a PCN reaction causing severe rash involving mucus membranes or skin necrosis: No Has patient had a PCN reaction that required hospitalization: Yes Has patient had a PCN reaction occurring within the last 10 years: No If all of the above answers are "NO", then may proceed with Cephalosporin use.  Has patient had a PCN reaction causing immediate rash, facial/tongue/throat swelling, SOB or lightheadedness with  hypotension: Yes Has patient had a PCN reaction causing severe rash involving mucus membranes or skin necrosis: No Has patient had a PCN reaction that required hospitalization: Yes Has patient had a PCN reaction occurring within the last 10 years: No If all of the above answers are "NO", then may proceed with Cephalosporin use. Has patient had a PCN reaction causing immediate rash, facial/tongue/throat swelling, SOB or lightheadedness with hypotension: Yes Has patient had a PCN reaction causing severe rash involving mucus membranes or skin necrosis: No Has patient had a PCN reaction that required hospitalization: Yes Has patient had a PCN reaction occurring within the last 10 years: No If all of the above answers are "NO", then may proceed with Cephalosporin use.   Sulfa Antibiotics Hives   Tape Other (See Comments)    Large Boils-PAPER TAPE OK TO USE  Large Boils-PAPER TAPE OK TO USE    I personally reviewed active problem list, medication list, allergies, social history, notes from last encounter with the patient/caregiver today.  ROS  Constitutional: Negative for fever or weight change.  HEENT: Positive for nasal congestion Respiratory: Positive for cough, negative for shortness of breath.   Cardiovascular: Negative for chest pain or palpitations.  Gastrointestinal: Negative for abdominal pain, no bowel changes.  Musculoskeletal: Negative for gait problem or joint swelling.  Skin: Negative for rash.  Neurological: Negative for dizziness or headache.  No other specific complaints in a complete review of systems (except as listed in HPI above).   Objective  Virtual encounter, vitals not obtained.  Body mass index is 38.94 kg/m.  Nursing Note and Vital Signs reviewed.  Physical Exam  Awake, alert and oriented.    No results found for this or any previous visit (from the past 72 hour(s)).  Assessment & Plan  1. History of COVID-19   2. Nasal congestion  She had a  virtual visit on Sunday with covid clinic.  They prescribed Flonase, Albuterol and promethazine-dextromethorphan.   - azelastine (ASTELIN) 0.1 % nasal spray; Place 2 sprays into both nostrils every morning. Use in each nostril as directed  Dispense: 30 mL; Refill: 0  3. Cough  She had a virtual visit on Sunday with covid clinic.  They prescribed Flonase, Albuterol and promethazine-dextromethorphan.    -Red flags and when to present for emergency care or RTC including fever >101.64F, chest pain, shortness of breath, new/worsening/un-resolving symptoms, reviewed with patient at time of visit. Follow up and care instructions discussed and provided in AVS. - I discussed the assessment and treatment plan with the patient. The patient was provided an opportunity to ask questions and all were answered. The patient agreed with the plan and demonstrated an understanding of the instructions.  I provided 15 minutes of non-face-to-face time during this encounter.  Berniece Salines, FNP

## 2021-02-28 NOTE — Telephone Encounter (Signed)
Summary: cough concerns   Patient has been previously diagnosed with COVID 19, quarantined and is now testing negative   The patient has previously been prescribed medication and also had a telehealth visit on Sunday 02/26/21 but shares that the medications they were prescribed have been ineffective so far   The patient would like to discuss this further when possible     Call to patient- patient states she has developed sinus headache today with her cough and congestion and she has also started nasal congestion. Patient is asking to be treated for sinus infection. Patient had virtual visit 8/28 for long COVID. Patient advised I would send request to provider- if she is required to have another visit- office will let her know. Reason for Disposition  [1] Caller has non-urgent question AND [2] triager unable to answer  Answer Assessment - Initial Assessment Questions 1. COVID-19 ONSET: "When did the symptoms of COVID-19 first start?"     02/04/21 2. DIAGNOSIS CONFIRMATION: "How were you diagnosed?" (e.g., COVID-19 oral or nasal viral test; COVID-19 antibody test; doctor visit)     Home test 3. MAIN SYMPTOM:  "What is your main concern or symptom right now?" (e.g., breathing difficulty, cough, fatigue. loss of smell)     Cough- chest congestion- sinus headache 4. SYMPTOM ONSET: "When did the  cough/congestion  start?"     Rebound to Paxlovid- has come and gone once 5. BETTER-SAME-WORSE: "Are you getting better, staying the same, or getting worse over the last 1 to 2 weeks?"     Same- good days and bad days 6. RECENT MEDICAL VISIT: "Have you been seen by a healthcare provider (doctor, NP, PA) for these persisting COVID-19 symptoms?" If Yes, ask: "When were you seen?" (e.g., date)     8/28- virtual visit 7. COUGH: "Do you have a cough?" If Yes, ask: "How bad is the cough?"       Yes- only with congestion- often 8. FEVER: "Do you have a fever?" If Yes, ask: "What is your temperature, how was it  measured, and when did it start?"     no 9. BREATHING DIFFICULTY: "Are you having any trouble breathing?" If Yes, ask: "How bad is your breathing?" (e.g., mild, moderate, severe)    - MILD: No SOB at rest, mild SOB with walking, speaks normally in sentences, can lie down, no retractions, pulse < 100.    - MODERATE: SOB at rest, SOB with minimal exertion and prefers to sit, cannot lie down flat, speaks in phrases, mild retractions, audible wheezing, pulse 100-120.    - SEVERE: Very SOB at rest, speaks in single words, struggling to breathe, sitting hunched forward, retractions, pulse > 120       Congestion related- otherwise-good O2sat normal-99% 10. HIGH RISK DISEASE: "Do you have any chronic medical problems?" (e.g., asthma, heart or lung disease, weak immune system, obesity, etc.)       Seizures, overweight, HS 11. VACCINE: "Have you gotten the COVID-19 vaccine?" If Yes, ask: "Which one, how many shots, when did you get it?"       Yes-Moderna 12. BOOSTER: "Have you received your COVID-19 booster?" If Yes, ask: "Which one and when did you get it?"       yes 13. PREGNANCY: "Is there any chance you are pregnant?" "When was your last menstrual period?"       No 14. OTHER SYMPTOMS: "Do you have any other symptoms?"  (e.g., fatigue, headache, muscle pain, weakness)  Sinus headache, cough-congestion in chest and some nasal congestion 15. O2 SATURATION MONITOR:  "Do you use an oxygen saturation monitor (pulse oximeter) at home?" If Yes, ask "What is your reading (oxygen level) today?" "What is your usual oxygen saturation reading?" (e.g., 95%)       99%  Protocols used: Coronavirus (COVID-19) Persisting Symptoms Follow-up Call-[facility]

## 2021-02-28 NOTE — Telephone Encounter (Signed)
Time Line:  COVID+ 02/04/21 Long COVID 8/28- Medications prescribed: Albuterol Sulfate 108 (90 Base) MCG/ACT 1-2 puffs Inhalation Every 6 hours PRN  Fluticasone Propionate 50 MCG/ACT 2 sprays Each Nare Daily  Promethazine-DM 6.25-15 MG/5ML 5 mLs Oral At bedtime PRN

## 2021-03-01 ENCOUNTER — Telehealth (INDEPENDENT_AMBULATORY_CARE_PROVIDER_SITE_OTHER): Payer: Medicaid Other | Admitting: Nurse Practitioner

## 2021-03-01 DIAGNOSIS — Z8616 Personal history of COVID-19: Secondary | ICD-10-CM

## 2021-03-01 NOTE — Telephone Encounter (Signed)
She will need virtual visit

## 2021-03-01 NOTE — Progress Notes (Signed)
Visit was cancelled.

## 2021-03-01 NOTE — Telephone Encounter (Signed)
Virtual appt sch'd with Della Goo for this afternoon

## 2021-03-02 DIAGNOSIS — J019 Acute sinusitis, unspecified: Secondary | ICD-10-CM | POA: Diagnosis not present

## 2021-03-06 ENCOUNTER — Other Ambulatory Visit: Payer: Self-pay | Admitting: Nurse Practitioner

## 2021-03-06 DIAGNOSIS — R0981 Nasal congestion: Secondary | ICD-10-CM

## 2021-03-07 NOTE — Telephone Encounter (Signed)
last RF 02/28/21 30 ml

## 2021-03-14 ENCOUNTER — Ambulatory Visit: Payer: Medicaid Other | Admitting: Family Medicine

## 2021-03-20 NOTE — Unmapped (Signed)
[facility] Pharm[facility] Refill Coordination Note    Specialty Medication(s) to be Shipped:   Inflammatory Disorders: Humira    Other medication(s) to be shipped: No additional medications requested for fill at this time     Erica Norman, DOB: [birth year]  Phone: [phone] (home)       All above HIPAA information was verified with patient.     Was a Nurse, learning disability used for this call? No    Completed refill call assessment today to schedule patient's medication shipment from the [facility] [phone]).  All relevant notes have been reviewed.     Specialty medication(s) and dose(s) confirmed: Regimen is correct and unchanged.   Changes to medications: Erica Norman reports no changes at this time.  Changes to insurance: No  New side effects reported not previously addressed with a pharmacist or physician: None reported  Questions for the pharmacist: No    Confirmed patient received a Conservation officer, historic buildings and a Surveyor, mining with first shipment. The patient will receive a drug information handout for each medication shipped and additional FDA Medication Guides as required.       DISEASE/MEDICATION-SPECIFIC INFORMATION        For patients on injectable medications: Patient currently has 2 doses left.  Next injection is scheduled for 03/20/2021.    SPECIALTY MEDICATION ADHERENCE     Medication Adherence    Patient reported X missed doses in the last month: 0  Specialty Medication: Humira CF 80 mg/0.8 ml  Patient is on additional specialty medications: No  Any gaps in refill history greater than 2 weeks in the last 3 months: no  Demonstrates understanding of importance of adherence: yes  Informant: patient  Reliability of informant: reliable  Confirmed plan for next specialty medication refill: delivery by pharmacy  Refills needed for supportive medications: not needed              Were doses missed due to medication being on hold? No        REFERRAL TO PHARMACIST     Referral to the pharmacist: Not needed      [facility]     Shipping address confirmed in Epic.     Delivery Scheduled: Yes, Expected medication delivery date: 03/24/2021.     Medication will be delivered via Same Day Courier to the prescription address in Epic WAM.    Erica Norman Erica Norman   [facility] Specialty Technician

## 2021-03-21 ENCOUNTER — Encounter: Payer: Self-pay | Admitting: Family Medicine

## 2021-03-21 ENCOUNTER — Other Ambulatory Visit: Payer: Self-pay

## 2021-03-21 ENCOUNTER — Ambulatory Visit: Payer: Medicaid Other | Admitting: Family Medicine

## 2021-03-21 VITALS — BP 112/68 | HR 97 | Temp 98.0°F | Resp 16 | Ht 65.0 in | Wt 237.1 lb

## 2021-03-21 DIAGNOSIS — R052 Subacute cough: Secondary | ICD-10-CM

## 2021-03-21 DIAGNOSIS — Z111 Encounter for screening for respiratory tuberculosis: Secondary | ICD-10-CM | POA: Diagnosis not present

## 2021-03-21 DIAGNOSIS — K219 Gastro-esophageal reflux disease without esophagitis: Secondary | ICD-10-CM

## 2021-03-21 DIAGNOSIS — R059 Cough, unspecified: Secondary | ICD-10-CM

## 2021-03-21 MED ORDER — FAMOTIDINE 40 MG PO TABS: 20.0000 mg | ORAL_TABLET | Freq: Two times a day (BID) | ORAL | 1 refills | Status: DC | PRN

## 2021-03-21 MED ORDER — PANTOPRAZOLE SODIUM 40 MG PO TBEC: DELAYED_RELEASE_TABLET | ORAL | 0 refills | Status: DC

## 2021-03-21 NOTE — Progress Notes (Signed)
Patient ID: Destiny Flores, female    DOB: 07/14/1980, 40 y.o.   MRN: [id number]  PCP: Danelle Berry, PA-C  Chief Complaint  Patient presents with   POST COVID    Pt states still having cough, very constant. Also experiencing too much heartburn. Pt is interested in TB skin done.    Subjective:   Destiny Flores is a 40 y.o. female, presents to clinic with CC of the following:  HPI  Patient presents for follow-up after COVID infection with upper respiratory and GI symptoms Her diarrhea has subsided but she has very upset stomach and acid reflux not well controlled with famotidine She is able to eat and drink but continues to have some discomfort and intermittent cramping after eating and decreased appetite relative to her baseline.  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   Thrombocytosis 05/17/2020   Hyperlipidemia 05/17/2020   Gastroesophageal reflux disease without esophagitis 05/17/2020   Chronic sinusitis 05/17/2020   Morbid obesity with BMI of 40.0-44.9, adult (HCC) 07/14/2019   Developmental delay, mild 03/10/2018   Vitamin D deficiency 03/06/2018   Cerebral palsy (HCC) 12/24/2017   Epilepsy (HCC) 12/24/2017   Hidradenitis suppurativa 12/24/2017   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      Current Outpatient Medications:    Adalimumab (HUMIRA PEN-CD/UC/HS STARTER) 80 MG/0.8ML PNKT, Inject the contents of 1 pen (80mg ) under the skin once weekly., Disp: , Rfl:    ascorbic acid (VITAMIN C) 1000 MG tablet, Take by mouth., Disp: , Rfl:    Brivaracetam (BRIVIACT) 100 MG TABS, Take 1 tablet by mouth 2  (two) times daily., Disp: , Rfl:    felbamate (FELBATOL) 600 MG tablet, Take 600 mg by mouth 3 (three) times daily., Disp: , Rfl:    fexofenadine (ALLEGRA) 180 MG tablet, Take by mouth., Disp: , Rfl:    HYDROcodone-acetaminophen (NORCO/VICODIN) 5-325 MG tablet, Take 1 tablet by mouth every 6 (six) hours as needed., Disp: , Rfl:    magnesium oxide (MAG-OX) 400 MG tablet, Take by mouth., Disp: , Rfl:    Melatonin 3 MG CAPS, Take by mouth., Disp: , Rfl:    montelukast (SINGULAIR) 10 MG tablet, Take 10 mg by mouth daily., Disp: , Rfl:    pantoprazole (PROTONIX) 40 MG tablet, 20 mg., Disp: , Rfl:    pyridOXINE (VITAMIN B-6) 100 MG tablet, Take 100 mg by mouth daily., Disp: , Rfl:    spironolactone (ALDACTONE) 100 MG tablet, Take by mouth., Disp: , Rfl:    SUMAtriptan (IMITREX) 50 MG tablet, Take 50 mg by mouth daily as needed., Disp: , Rfl:    TURMERIC CURCUMIN PO, Take 2 capsules by mouth daily., Disp: , Rfl:    acetaminophen (TYLENOL) 500 MG tablet, Take 1,000 mg by mouth every 6 (six) hours as needed for moderate pain or headache., Disp: , Rfl:    albuterol (VENTOLIN HFA) 108 (90 Base) MCG/ACT inhaler, Inhale 1-2 puffs into the lungs every 6 (six) hours as needed for up to 10 days for wheezing or shortness of breath., Disp: 8 g, Rfl: 0   aspirin-acetaminophen-caffeine (EXCEDRIN MIGRAINE) [phone]  MG tablet, Take 2 tablets by mouth every 6 (six) hours as needed for headache or migraine. (Patient not taking: Reported on 03/21/2021), Disp: , Rfl:    azelastine (ASTELIN) 0.1 % nasal spray, Place 2 sprays into both nostrils every morning. Use in each nostril as directed, Disp: 30 mL, Rfl: 0   budesonide (RHINOCORT AQUA) 32 MCG/ACT nasal spray, Place into the nose. (Patient not taking: No sig reported), Disp: , Rfl:    clobetasol ointment (TEMOVATE) 0.05 %, Apply topically 2 (two) times daily. (Patient not taking: Reported on 03/21/2021), Disp: , Rfl:    fluticasone (FLONASE) 50 MCG/ACT nasal spray, Place  2 sprays into both nostrils daily for 10 days., Disp: 16 g, Rfl: 0   Allergies  Allergen Reactions   Codeine Anaphylaxis   Penicillins Anaphylaxis and Other (See Comments)    Has patient had a PCN reaction causing immediate rash, facial/tongue/throat swelling, SOB or lightheadedness with hypotension: Yes Has patient had a PCN reaction causing severe rash involving mucus membranes or skin necrosis: No Has patient had a PCN reaction that required hospitalization: Yes Has patient had a PCN reaction occurring within the last 10 years: No If all of the above answers are "NO", then may proceed with Cephalosporin use.  Has patient had a PCN reaction causing immediate rash, facial/tongue/throat swelling, SOB or lightheadedness with hypotension: Yes Has patient had a PCN reaction causing severe rash involving mucus membranes or skin necrosis: No Has patient had a PCN reaction that required hospitalization: Yes Has patient had a PCN reaction occurring within the last 10 years: No If all of the above answers are "NO", then may proceed with Cephalosporin use. Has patient had a PCN reaction causing immediate rash, facial/tongue/throat swelling, SOB or lightheadedness with hypotension: Yes Has patient had a PCN reaction causing severe rash involving mucus membranes or skin necrosis: No Has patient had a PCN reaction that required hospitalization: Yes Has patient had a PCN reaction occurring within the last 10 years: No If all of the above answers are "NO", then may proceed with Cephalosporin use.   Sulfa Antibiotics Hives   Tape Other (See Comments)    Large Boils-PAPER TAPE OK TO USE Large Boils-PAPER TAPE OK TO USE     Social History   Tobacco Use   Smoking status: Never   Smokeless tobacco: Never  Vaping Use   Vaping Use: Never used  Substance Use Topics   Alcohol use: Never   Drug use: Never      Chart Review Today: I personally reviewed active problem list, medication list,  allergies, family history, social history, health maintenance, notes from last encounter, lab results, imaging with the patient/caregiver today.   Review of Systems  Constitutional: Negative.   HENT: Negative.    Eyes: Negative.   Respiratory: Negative.    Cardiovascular: Negative.   Gastrointestinal: Negative.   Endocrine: Negative.   Genitourinary: Negative.   Musculoskeletal: Negative.   Skin: Negative.   Allergic/Immunologic: Negative.   Neurological: Negative.   Hematological: Negative.   Psychiatric/Behavioral: Negative.    All other systems reviewed and are negative.     Objective:   Vitals:   03/21/21 1346  BP: 112/68  Pulse: 97  Resp: 16  Temp: 98 F (36.7 C)  TempSrc: Oral  SpO2: 98%  Weight: 237 lb 1.6 oz (107.5 kg)  Height: 5\' 5"  (1.651 m)    Body mass index is 39.46 kg/m.  Physical Exam Vitals and nursing note reviewed.  Constitutional:  General: She is not in acute distress.    Appearance: Normal appearance. She is well-developed. She is obese. She is not ill-appearing, toxic-appearing or diaphoretic.     Interventions: Face mask in place.  HENT:     Head: Normocephalic and atraumatic.     Right Ear: External ear normal.     Left Ear: External ear normal.  Eyes:     General: Lids are normal. No scleral icterus.       Right eye: No discharge.        Left eye: No discharge.     Conjunctiva/sclera: Conjunctivae normal.  Neck:     Trachea: Phonation normal. No tracheal deviation.  Cardiovascular:     Rate and Rhythm: Normal rate and regular rhythm.     Pulses: Normal pulses.          Radial pulses are 2+ on the right side and 2+ on the left side.       Posterior tibial pulses are 2+ on the right side and 2+ on the left side.     Heart sounds: Normal heart sounds. No murmur heard.   No friction rub. No gallop.  Pulmonary:     Effort: Pulmonary effort is normal. No respiratory distress.     Breath sounds: Normal breath sounds. No stridor. No  wheezing, rhonchi or rales.  Chest:     Chest wall: No tenderness.  Abdominal:     General: Bowel sounds are normal. There is no distension.     Palpations: Abdomen is soft.     Tenderness: There is no abdominal tenderness. There is no right CVA tenderness, left CVA tenderness, guarding or rebound.  Musculoskeletal:     Right lower leg: No edema.     Left lower leg: No edema.  Skin:    General: Skin is warm and dry.     Coloration: Skin is not jaundiced or pale.     Findings: No rash.  Neurological:     Mental Status: She is alert.     Motor: No abnormal muscle tone.     Gait: Gait normal.  Psychiatric:        Mood and Affect: Mood normal.        Speech: Speech normal.        Behavior: Behavior normal.     Results for orders placed or performed in visit on 05/25/20  CBC with Differential/Platelet  Result Value Ref Range   WBC 11.9 (H) 3.8 - 10.8 Thousand/uL   RBC 4.45 3.80 - 5.10 Million/uL   Hemoglobin 13.5 11.7 - 15.5 g/dL   HCT 43.3 29.5 - 18.8 %   MCV 90.1 80.0 - 100.0 fL   MCH 30.3 27.0 - 33.0 pg   MCHC 33.7 32.0 - 36.0 g/dL   RDW 41.6 60.6 - 30.1 %   Platelets 541 (H) 140 - 400 Thousand/uL   MPV 9.5 7.5 - 12.5 fL   Neutro Abs 6,973 1,500 - 7,800 cells/uL   Lymphs Abs 3,856 850 - 3,900 cells/uL   Absolute Monocytes 785 200 - 950 cells/uL   Eosinophils Absolute 214 15 - 500 cells/uL   Basophils Absolute 71 0 - 200 cells/uL   Neutrophils Relative % 58.6 %   Total Lymphocyte 32.4 %   Monocytes Relative 6.6 %   Eosinophils Relative 1.8 %   Basophils Relative 0.6 %  COMPLETE METABOLIC PANEL WITH GFR  Result Value Ref Range   Glucose, Bld 81 65 - 99 mg/dL   BUN  10 7 - 25 mg/dL   Creat 1.76 1.60 - 7.37 mg/dL   GFR, Est Non African American 114 > OR = 60 mL/min/1.25m2   GFR, Est African American 133 > OR = 60 mL/min/1.87m2   BUN/Creatinine Ratio NOT APPLICABLE 6 - 22 (calc)   Sodium 139 135 - 146 mmol/L   Potassium 4.4 3.5 - 5.3 mmol/L   Chloride 103 98 - 110  mmol/L   CO2 25 20 - 32 mmol/L   Calcium 9.6 8.6 - 10.2 mg/dL   Total Protein 6.9 6.1 - 8.1 g/dL   Albumin 3.9 3.6 - 5.1 g/dL   Globulin 3.0 1.9 - 3.7 g/dL (calc)   AG Ratio 1.3 1.0 - 2.5 (calc)   Total Bilirubin 0.4 0.2 - 1.2 mg/dL   Alkaline phosphatase (APISO) 78 31 - 125 U/L   AST 17 10 - 30 U/L   ALT 21 6 - 29 U/L  Vitamin D 1,25 dihydroxy  Result Value Ref Range   Vitamin D 1, 25 (OH)2 Total 41 18 - 72 pg/mL   Vitamin D3 1, 25 (OH)2 41 pg/mL   Vitamin D2 1, 25 (OH)2 <8 pg/mL       Assessment & Plan:     ICD-10-CM   1. Gastroesophageal reflux disease, unspecified whether esophagitis present  K21.9 pantoprazole (PROTONIX) 40 MG tablet    famotidine (PEPCID) 40 MG tablet   worse sx after GI SE with paxlovid - burning, reflux, indigestion, cramping, BID PPI and pepcid as needed for 2-4 weeks, reduce PPI to daily and f/up     2. Cough  R05.9    lungs clear on exam, VSS, post covid sx gradually improving, no CP, sweats, some cough i suspect is from much worse GERD/reflux    3. Screening-pulmonary TB  Z11.1 QuantiFERON-TB Gold Plus   needed TB screening for job     Close follow-up on her symptoms she may need a GI specialist if symptoms do not gradually improve May need inhalers if GI symptoms improve and her cough or respiratory symptoms do not return to her baseline -though I do suspect she may be having some prolonged post-COVID symptoms     Danelle Berry, PA-C 03/21/21 2:07 PM

## 2021-03-22 ENCOUNTER — Ambulatory Visit: Payer: Medicaid Other | Admitting: Dietician

## 2021-03-24 ENCOUNTER — Encounter: Payer: Self-pay | Admitting: Family Medicine

## 2021-03-24 LAB — QUANTIFERON-TB GOLD PLUS
Mitogen-NIL: 7.09 IU/mL
NIL: 0.06 IU/mL
QuantiFERON-TB Gold Plus: NEGATIVE
TB1-NIL: 0.01 IU/mL
TB2-NIL: 0.02 IU/mL

## 2021-03-24 MED FILL — HUMIRA(CF) PEN 80 MG/0.8 ML SUBCUTANEOUS KIT: 28 days supply | Qty: 4 | Fill #8

## 2021-03-24 NOTE — Progress Notes (Signed)
Patient called for results- notified negative and copy has been mailed.

## 2021-04-14 DIAGNOSIS — L732 Hidradenitis suppurativa: Principal | ICD-10-CM

## 2021-04-14 MED ORDER — HUMIRA(CF) PEN 80 MG/0.8 ML SUBCUTANEOUS KIT
11 refills | 0 days
Start: 2021-04-14 — End: ?

## 2021-04-14 NOTE — Unmapped (Signed)
[facility] Refill Coordination Note    Specialty Medication(s) to be Shipped:   Inflammatory Disorders: Humira    Other medication(s) to be shipped: No additional medications requested for fill at this time     Erica Norman, DOB: 04/28/1981  Phone: [phone] (home)       All above HIPAA information was verified with patient.     Was a Nurse, learning disability used for this call? No    Completed refill call assessment today to schedule patient's medication shipment from the [facility] [phone]).  All relevant notes have been reviewed.     Specialty medication(s) and dose(s) confirmed: Regimen is correct and unchanged.   Changes to medications: Sherl reports no changes at this time.  Changes to insurance: No  New side effects reported not previously addressed with a pharmacist or physician: None reported  Questions for the pharmacist: No    Confirmed patient received a Conservation officer, historic buildings and a Surveyor, mining with first shipment. The patient will receive a drug information handout for each medication shipped and additional FDA Medication Guides as required.       DISEASE/MEDICATION-SPECIFIC INFORMATION        For patients on injectable medications: Patient currently has 2 doses left.  Next injection is scheduled for Monday, 10/17.    SPECIALTY MEDICATION ADHERENCE              Were doses missed due to medication being on hold? No    Humira - 2 doses left     REFERRAL TO PHARMACIST     Referral to the pharmacist: Not needed      [facility]     Shipping address confirmed in Epic.     Delivery Scheduled: Yes, Expected medication delivery date: Friday, 10/21.  However, Rx request for refills was sent to the provider as there are none remaining.     Medication will be delivered via Same Day Courier to the prescription address in Epic WAM.    Shenia Alan A Desiree Lucy [facility] Specialty Pharmacist

## 2021-04-14 NOTE — Unmapped (Signed)
Refill request for adalimumab (HUMIRA,CF, PEN) 80 mg/0.8 mL PnKt.    LOV: 01/24/2021    Medication pended for your review.

## 2021-04-17 MED ORDER — HUMIRA(CF) PEN 80 MG/0.8 ML SUBCUTANEOUS KIT
SUBCUTANEOUS | 11 refills | 0.00000 days | Status: CP
Start: 2021-04-17 — End: ?
  Filled 2021-04-21: qty 4, 28d supply, fill #0

## 2021-04-24 ENCOUNTER — Other Ambulatory Visit: Payer: Self-pay

## 2021-04-24 ENCOUNTER — Ambulatory Visit
Admission: EM | Admit: 2021-04-24 | Discharge: 2021-04-24 | Disposition: A | Discharge status: Home / Self Care | Payer: Medicaid Other | Attending: Physician Assistant | Admitting: Physician Assistant

## 2021-04-24 ENCOUNTER — Encounter: Payer: Self-pay | Admitting: Dietician

## 2021-04-24 DIAGNOSIS — Z20822 Contact with and (suspected) exposure to covid-19: Secondary | ICD-10-CM | POA: Diagnosis not present

## 2021-04-24 DIAGNOSIS — J069 Acute upper respiratory infection, unspecified: Secondary | ICD-10-CM | POA: Insufficient documentation

## 2021-04-24 DIAGNOSIS — R051 Acute cough: Secondary | ICD-10-CM | POA: Insufficient documentation

## 2021-04-24 DIAGNOSIS — R0981 Nasal congestion: Secondary | ICD-10-CM | POA: Insufficient documentation

## 2021-04-24 NOTE — Progress Notes (Signed)
Have not heard back from patient to reschedule her cancelled appointment from 03/22/21. Sent notification to referring provider.

## 2021-04-24 NOTE — ED Triage Notes (Signed)
Pt states that she had covid a few months ago, test was negative at home.

## 2021-04-24 NOTE — Discharge Instructions (Addendum)

## 2021-04-24 NOTE — ED Provider Notes (Signed)
[facility]    CSN: [id number] Arrival date & time: 04/24/21  1525      History   Chief Complaint Chief Complaint  Patient presents with   Nasal Congestion    HPI Destiny Flores is a 40 y.o. female presenting for 4-day history of nasal congestion, sinus pressure and cough.  Patient denies fever, fatigue, aches, sore throat, breathing occultly, vomiting or diarrhea.  Her niece and nephew are ill with similar symptoms and she has been around them.  Patient has been careful to take decongestants then she has a seizure disorder and has been using nasal saline.  She is concerned about possible sinus infection at this time.  She has a personal history of COVID-19 almost 3 months ago.  No other complaints.  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   GERD (gastroesophageal reflux disease)    Headache    Hidradenitis suppurativa 12/24/2017   History of kidney stones    H/O   History of methicillin resistant staphylococcus aureus (MRSA) 2008   positive from axilla, left   Hx of Kawasaki's disease    Hydradenitis    Seizures (HCC)    LAST SEIZURE 2012   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   battery change in 2019, inserted in 2002    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    AXILLARY HIDRADENITIS EXCISION     MULTIPLE HYDRADENITIS SURGERIES IN [place]   hidradenitis groin Left 04/2017   last surgery done on groin and the symptoms have returned   HYDRADENITIS EXCISION N/A 01/16/2018   Procedure: EXCISION HIDRADENITIS GROIN;  Surgeon: Leafy Ro, MD;  Location: [facility];  Service: General;  Laterality: N/A;   KNEE SURGERY Left 2010   tendon and meniscus repair   NOSE SURGERY  1998   deviated septum and sinus repair   ROBOTIC ASSISTED LAPAROSCOPIC CHOLECYSTECTOMY N/A 07/24/2018   Procedure: ROBOTIC ASSISTED LAPAROSCOPIC CHOLECYSTECTOMY;  Surgeon: Leafy Ro, MD;  Location: [facility];  Service: General;  Laterality: N/A;   TONSILLECTOMY     TYMPANOSTOMY TUBE PLACEMENT     as a kid   VAGUS NERVE STIMULATOR GENERATOR CHANGE  03/2015    OB History   No obstetric history on file.      Home Medications    Prior to Admission medications   Medication Sig Start Date End Date Taking? Authorizing Provider  Adalimumab (HUMIRA PEN-CD/UC/HS STARTER) 80 MG/0.8ML PNKT Inject the contents of 1 pen (80mg ) under the skin once weekly. 04/01/20  Yes  [provider]  ascorbic acid (VITAMIN C) 1000 MG tablet Take by mouth.   Yes [provider]  Brivaracetam (BRIVIACT) 100 MG TABS Take 1 tablet by mouth 2 (two) times daily. 11/21/20  Yes [provider]  famotidine (PEPCID) 40 MG tablet Take 0.5 tablets (20 mg total) by mouth 2 (two) times daily as needed for heartburn or indigestion. 03/21/21  Yes Danelle Berry, PA-C  felbamate (FELBATOL) 600 MG tablet Take 600 mg by mouth 3 (three) times daily.   Yes [provider]  fexofenadine (ALLEGRA) 180 MG tablet Take by mouth. 10/31/20 10/31/21 Yes [provider]  HYDROcodone-acetaminophen (NORCO/VICODIN) 5-325 MG tablet Take 1 tablet by mouth every 6 (six) hours as needed. 08/09/20  Yes [provider]  magnesium oxide (MAG-OX) 400 MG tablet Take by mouth.   Yes [provider]  Melatonin 3 MG CAPS Take by mouth.   Yes [provider]  montelukast (SINGULAIR) 10 MG tablet Take 10 mg by mouth daily. 09/08/20  Yes [provider]  pantoprazole (PROTONIX) 40 MG tablet Take 1 tablet (40 mg total) by mouth 2 (two) times daily for 28 days, THEN 1 tablet (40 mg total) daily. 03/21/21 07/17/21 Yes Danelle Berry, PA-C  pyridOXINE (VITAMIN B-6) 100 MG tablet Take 100 mg by mouth daily.   Yes [provider]  spironolactone (ALDACTONE) 100 MG tablet Take by mouth. 12/10/19  Yes [provider]  SUMAtriptan (IMITREX) 50 MG tablet Take 50 mg by mouth daily as needed. 07/25/20 07/25/21 Yes [provider]  TURMERIC CURCUMIN PO Take 2 capsules by mouth daily.   Yes [provider]  albuterol (VENTOLIN HFA) 108 (90 Base) MCG/ACT inhaler Inhale 1-2 puffs into the lungs every 6 (six) hours as needed for up to 10 days for wheezing or shortness of breath. 02/26/21 03/08/21  Benay Pike, NP    Family History Family History  Problem Relation Age of Onset   Hyperlipidemia Father    Epilepsy Brother    ADD / ADHD Brother    Breast cancer Paternal Grandmother 82       Passed away at 52yo   Asthma Mother     Social History Social History   Tobacco Use   Smoking status: Never   Smokeless tobacco: Never  Vaping Use   Vaping Use: Never used  Substance Use Topics   Alcohol use: Never   Drug use: Never     Allergies   Codeine, Penicillins, Sulfa antibiotics, and Tape   Review of Systems Review of Systems  Constitutional:  Negative for chills, diaphoresis, fatigue and fever.  HENT:   Positive for congestion, rhinorrhea and sinus pressure. Negative for ear pain and sore throat.   Respiratory:  Positive for cough. Negative for shortness of breath.   Gastrointestinal:  Negative for abdominal pain, nausea and vomiting.  Musculoskeletal:  Negative for arthralgias and myalgias.  Skin:  Negative for rash.  Neurological:  Negative for weakness and headaches.  Hematological:  Negative for adenopathy.    Physical Exam Triage Vital Signs ED Triage Vitals  Enc Vitals Group     BP 04/24/21 1608 (!) 123/92     Pulse Rate 04/24/21 1608 84     Resp 04/24/21 1608 16     Temp 04/24/21 1608 98 F (36.7 C)     Temp Source 04/24/21 1608 Oral     SpO2 04/24/21 1608 100 %     Weight 04/24/21 1605 230 lb (104.3 kg)  Height 04/24/21 1605 5\' 4"  (1.626 m)     Head Circumference --      Peak Flow --      Pain Score 04/24/21 1605 5     Pain Loc --      Pain Edu? --      Excl. in GC? --    No data found.  Updated Vital Signs BP (!) 123/92 (BP Location: Left Arm)   Pulse 84   Temp 98 F (36.7 C) (Oral)   Resp 16   Ht 5\' 4"  (1.626 m)   Wt 230 lb (104.3 kg)   LMP 04/19/2021   SpO2 100%   BMI 39.48 kg/m     Physical Exam Vitals and nursing note reviewed.  Constitutional:      General: She is not in acute distress.    Appearance: Normal appearance. She is not ill-appearing or toxic-appearing.  HENT:     Head: Normocephalic and atraumatic.     Right Ear: Ear canal and external ear normal. A middle ear effusion is present.     Left Ear: Ear canal and external ear normal. A middle ear effusion is present.     Nose: Congestion and rhinorrhea present.     Mouth/Throat:     Mouth: Mucous membranes are moist.     Pharynx: Oropharynx is clear.  Eyes:     General: No scleral icterus.       Right eye: No discharge.        Left eye: No discharge.     Conjunctiva/sclera: Conjunctivae normal.  Cardiovascular:     Rate and Rhythm: Normal rate and regular rhythm.     Heart  sounds: Normal heart sounds.  Pulmonary:     Effort: Pulmonary effort is normal. No respiratory distress.     Breath sounds: Normal breath sounds.  Musculoskeletal:     Cervical back: Neck supple.  Skin:    General: Skin is dry.  Neurological:     General: No focal deficit present.     Mental Status: She is alert. Mental status is at baseline.     Motor: No weakness.     Gait: Gait normal.  Psychiatric:        Mood and Affect: Mood normal.        Behavior: Behavior normal.        Thought Content: Thought content normal.     UC Treatments / Results  Labs (all labs ordered are listed, but only abnormal results are displayed) Labs Reviewed - No data to display  EKG   Radiology No results found.  Procedures Procedures (including critical care time)  Medications Ordered in UC Medications - No data to display  Initial Impression / Assessment and Plan / UC Course  I have reviewed the triage vital signs and the nursing notes.  Pertinent labs & imaging results that were available during my care of the patient were reviewed by me and considered in my medical decision making (see chart for details).  40 year old female presenting for 4-day history of nasal congestion, sinus pressure and cough.  Also admits to bilateral ear pressure.  No fever or red flag signs/symptoms.  Personal history of COVID-19 almost 3 months ago.  Niece and nephew with similar symptoms.  Vitals are stable today.  She is afebrile and overall well-appearing.  She does have nasal congestion and clearish to light yellow rhinorrhea on exam.  Additionally mild bilateral TM effusions.  Chest clear to auscultation heart regular rate and  rhythm.  PCR COVID test obtained.  Current CDC guidelines, isolation protocol and ED precautions reviewed with patient.  Advised patient it is possible she could have COVID-19 again but I have low suspicion.  Advised her symptoms are consistent with viral URI.  Encouraged increasing  rest and fluids.  Advised nasal saline and careful use of decongestants.  Reviewed return and ED precautions.   Final Clinical Impressions(s) / UC Diagnoses   Final diagnoses:  Viral upper respiratory tract infection  Nasal congestion  Acute cough     Discharge Instructions      URI/COLD SYMPTOMS: Your exam today is consistent with a viral illness. Antibiotics are not indicated at this time. Use medications as directed, including cough syrup, nasal saline, and decongestants. Your symptoms should improve over the next few days and resolve within 7-10 days. Increase rest and fluids. F/u if symptoms worsen or predominate such as sore throat, ear pain, productive cough, shortness of breath, or if you develop high fevers or worsening fatigue over the next several days.    You have received COVID testing today either for positive exposure, concerning symptoms that could be related to COVID infection, screening purposes, or re-testing after confirmed positive.  Your test obtained today checks for active viral infection in the last 1-2 weeks. If your test is negative now, you can still test positive later. So, if you do develop symptoms you should either get re-tested and/or isolate x 5 days and then strict mask use x 5 days (unvaccinated) or mask use x 10 days (vaccinated). Please follow CDC guidelines.  While Rapid antigen tests come back in 15-20 minutes, send out PCR/molecular test results typically come back within 1-3 days. In the mean time, if you are symptomatic, assume this could be a positive test and treat/monitor yourself as if you do have COVID.   We will call with test results if positive. Please download the MyChart app and set up a profile to access test results.   If symptomatic, go home and rest. Push fluids. Take Tylenol as needed for discomfort. Gargle warm salt water. Throat lozenges. Take Mucinex DM or Robitussin for cough. Humidifier in bedroom to ease coughing. Warm showers.  Also review the COVID handout for more information.  COVID-19 INFECTION: The incubation period of COVID-19 is approximately 14 days after exposure, with most symptoms developing in roughly 4-5 days. Symptoms may range in severity from mild to critically severe. Roughly 80% of those infected will have mild symptoms. People of any age may become infected with COVID-19 and have the ability to transmit the virus. The most common symptoms include: fever, fatigue, cough, body aches, headaches, sore throat, nasal congestion, shortness of breath, nausea, vomiting, diarrhea, changes in smell and/or taste.    COURSE OF ILLNESS Some patients may begin with mild disease which can progress quickly into critical symptoms. If your symptoms are worsening please call ahead to the Emergency Department and proceed there for further treatment. Recovery time appears to be roughly 1-2 weeks for mild symptoms and 3-6 weeks for severe disease.   GO IMMEDIATELY TO ER FOR FEVER YOU ARE UNABLE TO GET DOWN WITH TYLENOL, BREATHING PROBLEMS, CHEST PAIN, FATIGUE, LETHARGY, INABILITY TO EAT OR DRINK, ETC  QUARANTINE AND ISOLATION: To help decrease the spread of COVID-19 please remain isolated if you have COVID infection or are highly suspected to have COVID infection. This means -stay home and isolate to one room in the home if you live with others. Do not share a  bed or bathroom with others while ill, sanitize and wipe down all countertops and keep common areas clean and disinfected. Stay home for 5 days. If you have no symptoms or your symptoms are resolving after 5 days, you can leave your house. Continue to wear a mask around others for 5 additional days. If you have been in close contact (within 6 feet) of someone diagnosed with COVID 19, you are advised to quarantine in your home for 14 days as symptoms can develop anywhere from 2-14 days after exposure to the virus. If you develop symptoms, you  must isolate.  Most current  guidelines for COVID after exposure -unvaccinated: isolate 5 days and strict mask use x 5 days. Test on day 5 is possible -vaccinated: wear mask x 10 days if symptoms do not develop -You do not necessarily need to be tested for COVID if you have + exposure and  develop symptoms. Just isolate at home x10 days from symptom onset During this global pandemic, CDC advises to practice social distancing, try to stay at least 62ft away from others at all times. Wear a face covering. Wash and sanitize your hands regularly and avoid going anywhere that is not necessary.  KEEP IN MIND THAT THE COVID TEST IS NOT 100% ACCURATE AND YOU SHOULD STILL DO EVERYTHING TO PREVENT POTENTIAL SPREAD OF VIRUS TO OTHERS (WEAR MASK, WEAR GLOVES, WASH HANDS AND SANITIZE REGULARLY). IF INITIAL TEST IS NEGATIVE, THIS MAY NOT MEAN YOU ARE DEFINITELY NEGATIVE. MOST ACCURATE TESTING IS DONE 5-7 DAYS AFTER EXPOSURE.   It is not advised by CDC to get re-tested after receiving a positive COVID test since you can still test positive for weeks to months after you have already cleared the virus.   *If you have not been vaccinated for COVID, I strongly suggest you consider getting vaccinated as long as there are no contraindications.     ED Prescriptions   None    PDMP not reviewed this encounter.   Shirlee Latch, PA-C 04/24/21 1658

## 2021-04-24 NOTE — ED Triage Notes (Signed)
Pt states that she is having sinus pain pressure, nasal congestion, cough at night. Tried otc cold and sinus. Sxs started 4 days ago.

## 2021-04-25 LAB — SARS CORONAVIRUS 2 (TAT 6-24 HRS): SARS Coronavirus 2: NEGATIVE

## 2021-04-27 ENCOUNTER — Encounter: Payer: Self-pay | Admitting: Family Medicine

## 2021-04-27 ENCOUNTER — Telehealth: Payer: Medicaid Other | Admitting: Physician Assistant

## 2021-04-27 DIAGNOSIS — B9689 Other specified bacterial agents as the cause of diseases classified elsewhere: Secondary | ICD-10-CM | POA: Diagnosis not present

## 2021-04-27 DIAGNOSIS — J019 Acute sinusitis, unspecified: Secondary | ICD-10-CM | POA: Diagnosis not present

## 2021-04-27 DIAGNOSIS — J329 Chronic sinusitis, unspecified: Secondary | ICD-10-CM

## 2021-04-27 DIAGNOSIS — D84821 Immunodeficiency due to drugs: Secondary | ICD-10-CM

## 2021-04-27 DIAGNOSIS — Z79899 Other long term (current) drug therapy: Secondary | ICD-10-CM

## 2021-04-27 MED ORDER — AZITHROMYCIN 250 MG PO TABS: ORAL_TABLET | ORAL | 0 refills | Status: AC

## 2021-04-27 MED ORDER — CEFIXIME 400 MG PO CAPS: 400.0000 mg | ORAL_CAPSULE | Freq: Every day | ORAL | 0 refills | Status: DC

## 2021-04-27 MED ORDER — BENZONATATE 100 MG PO CAPS: 100.0000 mg | ORAL_CAPSULE | Freq: Three times a day (TID) | ORAL | 0 refills | Status: DC | PRN

## 2021-04-27 MED ORDER — PREDNISONE 10 MG (21) PO TBPK: ORAL_TABLET | ORAL | 0 refills | Status: DC

## 2021-04-27 MED ORDER — AZELASTINE HCL 0.1 % NA SOLN: 1.0000 | Freq: Two times a day (BID) | NASAL | 12 refills | Status: DC

## 2021-04-27 NOTE — Telephone Encounter (Signed)
Reviewed virtual encounter today, zpak unlikely to help with pt history, I called and discussed with pt - alternative abx send in per uptodate algorithm  Pt should f/up with ENT  4th antibiotic this year for sinusitis     ICD-10-CM   1. Recurrent rhinosinusitis  J32.9 predniSONE (STERAPRED UNI-PAK 21 TAB) 10 MG (21) TBPK tablet    cefixime (SUPRAX) 400 MG CAPS capsule   recurrent sinusitis, immunocompromised/higher risk for bacterial sinusitis, antibiotics 3 weeks + steroid burst    2. Immunosuppression due to drug therapy (HCC)  D84.821 cefixime (SUPRAX) 400 MG CAPS capsule   Z79.899      Meds ordered this encounter  Trial of alternative nasal spray since pt has not tolerated intranasal steroid sprays - trigger migraines        azelastine (ASTELIN) 0.1 % nasal spray    Sig: Place 1-2 sprays into both nostrils 2 (two) times daily. Use in each nostril as directed    Dispense:  30 mL    Refill:  12    Chronic and severe rhinosinusitis and seasonal allergies             Danelle Berry, PA-C

## 2021-04-27 NOTE — Progress Notes (Signed)
Virtual Visit Consent   Destiny Flores, you are scheduled for a virtual visit with a [facility] provider today.     Just as with appointments in the office, your consent must be obtained to participate.  Your consent will be active for this visit and any virtual visit you may have with one of our providers in the next 365 days.     If you have a MyChart account, a copy of this consent can be sent to you electronically.  All virtual visits are billed to your insurance company just like a traditional visit in the office.    As this is a virtual visit, video technology does not allow for your provider to perform a traditional examination.  This may limit your provider's ability to fully assess your condition.  If your provider identifies any concerns that need to be evaluated in person or the need to arrange testing (such as labs, EKG, etc.), we will make arrangements to do so.     Although advances in technology are sophisticated, we cannot ensure that it will always work on either your end or our end.  If the connection with a video visit is poor, the visit may have to be switched to a telephone visit.  With either a video or telephone visit, we are not always able to ensure that we have a secure connection.     I need to obtain your verbal consent now.   Are you willing to proceed with your visit today?    Destiny Flores has provided verbal consent on 04/27/2021 for a virtual visit (video or telephone).   Destiny Flores, [name]   Date: 04/27/2021 2:38 PM   Virtual Visit via Video Note   I, Destiny Flores, connected with  Destiny Flores  ([id number], 12-Jul-1980) on 04/27/21 at  2:30 PM EDT by a video-enabled telemedicine application and verified that I am speaking with the correct person using two identifiers.  Location: Patient: Virtual Visit Location Patient: Home Provider: Virtual Visit Location Provider: Home Office   I discussed the limitations of evaluation and management by  telemedicine and the availability of in person appointments. The patient expressed understanding and agreed to proceed.    History of Present Illness: Destiny Flores is a 40 y.o. who identifies as a female who was assigned female at birth, and is being seen today for possible sinusitis. Symptoms started Monday last week with cold symptoms that persisted and progressed. Was seen this Monday and diagnosed with viral sinusitis and started on supportive measures which have not helps. Symptoms have progressed. Is having substantial sinus pain and tooth pain. Denies fever, chills, aches. 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/10/2018   Vitamin D deficiency 03/06/2018   Cerebral palsy (HCC) 12/24/2017   Epilepsy (HCC) 12/24/2017   Hidradenitis suppurativa 12/24/2017   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  Has patient had a PCN reaction causing immediate rash, facial/tongue/throat swelling, SOB or lightheadedness  with hypotension: Yes Has patient had a PCN reaction causing severe rash involving mucus membranes or skin necrosis: No Has patient had a PCN reaction that required hospitalization: Yes Has patient had a PCN reaction occurring within the last 10 years: No If all of the above answers are "NO", then may proceed with Cephalosporin use. Has patient had a PCN reaction causing immediate rash, facial/tongue/throat swelling, SOB or lightheadedness with hypotension: Yes Has patient had a PCN reaction causing severe rash involving mucus membranes or skin necrosis: No Has patient had a PCN reaction that required hospitalization: Yes Has patient had a PCN reaction occurring within the last 10 years: No If all of the above answers are "NO", then may proceed with Cephalosporin use.   Sulfa Antibiotics Hives   Tape Other (See Comments)    Large Boils-PAPER TAPE OK TO USE Large Boils-PAPER TAPE OK TO USE   Medications:  Current Outpatient Medications:    azithromycin (ZITHROMAX) 250 MG tablet, Take 2 tablets on day 1, then 1 tablet daily on days 2 through 5, Disp: 6 tablet, Rfl: 0   benzonatate (TESSALON) 100 MG capsule, Take 1 capsule (100 mg total) by mouth 3 (three) times daily as needed for cough., Disp: 30 capsule, Rfl: 0   Adalimumab (HUMIRA PEN-CD/UC/HS STARTER) 80 MG/0.8ML PNKT, Inject the contents of 1 pen (80mg ) under the skin once weekly., Disp: , Rfl:    albuterol (VENTOLIN HFA) 108 (90 Base) MCG/ACT inhaler, Inhale 1-2 puffs into the lungs every 6 (six) hours as needed for up to 10 days for wheezing or shortness of breath., Disp: 8 g, Rfl: 0   ascorbic acid (VITAMIN C) 1000 MG tablet, Take by mouth., Disp: , Rfl:    Brivaracetam (BRIVIACT) 100 MG TABS, Take 1 tablet by mouth 2 (two) times daily., Disp: , Rfl:    famotidine (PEPCID) 40 MG tablet, Take 0.5 tablets (20 mg total) by mouth 2 (two) times daily as needed for heartburn or indigestion., Disp: 180 tablet, Rfl: 1   felbamate (FELBATOL) 600  MG tablet, Take 600 mg by mouth 3 (three) times daily., Disp: , Rfl:    fexofenadine (ALLEGRA) 180 MG tablet, Take by mouth., Disp: , Rfl:    HYDROcodone-acetaminophen (NORCO/VICODIN) 5-325 MG tablet, Take 1 tablet by mouth every 6 (six) hours as needed., Disp: , Rfl:    magnesium oxide (MAG-OX) 400 MG tablet, Take by mouth., Disp: , Rfl:    Melatonin 3 MG CAPS, Take by mouth., Disp: , Rfl:    montelukast (SINGULAIR) 10 MG tablet, Take 10 mg by mouth daily., Disp: , Rfl:    pantoprazole (PROTONIX) 40 MG tablet, Take 1 tablet (40 mg total) by mouth 2 (two) times daily for 28 days, THEN 1 tablet (40 mg total) daily., Disp: 146 tablet, Rfl: 0   pyridOXINE (VITAMIN B-6) 100 MG tablet, Take 100 mg by mouth daily., Disp: , Rfl:    spironolactone (ALDACTONE) 100 MG tablet, Take by mouth., Disp: , Rfl:    SUMAtriptan (IMITREX) 50 MG tablet, Take 50 mg by mouth daily as needed., Disp: , Rfl:  TURMERIC CURCUMIN PO, Take 2 capsules by mouth daily., Disp: , Rfl:   Observations/Objective: Patient is well-developed, well-nourished in no acute distress.  Resting comfortably at home.  Head is normocephalic, atraumatic.  No labored breathing. Speech is clear and coherent with logical content.  Patient is alert and oriented at baseline.   Assessment and Plan: 1. Acute bacterial sinusitis - azithromycin (ZITHROMAX) 250 MG tablet; Take 2 tablets on day 1, then 1 tablet daily on days 2 through 5  Dispense: 6 tablet; Refill: 0 - benzonatate (TESSALON) 100 MG capsule; Take 1 capsule (100 mg total) by mouth 3 (three) times daily as needed for cough.  Dispense: 30 capsule; Refill: 0 Penicillin an Sulfa allergic. Cannot tolerate Doxycycline. Can take Azithromycin. Will rx Z-pack as want to avoid FQ if possible. Rx sent to pharmacy. Tessalon per orders. Supportive measures, OTC medications reviewed with patient.   Follow Up Instructions: I discussed the assessment and treatment plan with the patient. The patient  was provided an opportunity to ask questions and all were answered. The patient agreed with the plan and demonstrated an understanding of the instructions.  A copy of instructions were sent to the patient via MyChart unless otherwise noted below.   The patient was advised to call back or seek an in-person evaluation if the symptoms worsen or if the condition fails to improve as anticipated.  Time:  I spent 12 minutes with the patient via telehealth technology discussing the above problems/concerns.    Destiny Climes, PA-C

## 2021-04-27 NOTE — Patient Instructions (Addendum)
Destiny Flores, thank you for joining Destiny Climes, PA-C for today's virtual visit.  While this provider is not your primary care provider (PCP), if your PCP is located in our provider database this encounter information will be shared with them immediately following your visit.  Consent: (Patient) Destiny Flores provided verbal consent for this virtual visit at the beginning of the encounter.  Current Medications:  Current Outpatient Medications:    Adalimumab (HUMIRA PEN-CD/UC/HS STARTER) 80 MG/0.8ML PNKT, Inject the contents of 1 pen (80mg ) under the skin once weekly., Disp: , Rfl:    albuterol (VENTOLIN HFA) 108 (90 Base) MCG/ACT inhaler, Inhale 1-2 puffs into the lungs every 6 (six) hours as needed for up to 10 days for wheezing or shortness of breath., Disp: 8 g, Rfl: 0   ascorbic acid (VITAMIN C) 1000 MG tablet, Take by mouth., Disp: , Rfl:    Brivaracetam (BRIVIACT) 100 MG TABS, Take 1 tablet by mouth 2 (two) times daily., Disp: , Rfl:    famotidine (PEPCID) 40 MG tablet, Take 0.5 tablets (20 mg total) by mouth 2 (two) times daily as needed for heartburn or indigestion., Disp: 180 tablet, Rfl: 1   felbamate (FELBATOL) 600 MG tablet, Take 600 mg by mouth 3 (three) times daily., Disp: , Rfl:    fexofenadine (ALLEGRA) 180 MG tablet, Take by mouth., Disp: , Rfl:    HYDROcodone-acetaminophen (NORCO/VICODIN) 5-325 MG tablet, Take 1 tablet by mouth every 6 (six) hours as needed., Disp: , Rfl:    magnesium oxide (MAG-OX) 400 MG tablet, Take by mouth., Disp: , Rfl:    Melatonin 3 MG CAPS, Take by mouth., Disp: , Rfl:    montelukast (SINGULAIR) 10 MG tablet, Take 10 mg by mouth daily., Disp: , Rfl:    pantoprazole (PROTONIX) 40 MG tablet, Take 1 tablet (40 mg total) by mouth 2 (two) times daily for 28 days, THEN 1 tablet (40 mg total) daily., Disp: 146 tablet, Rfl: 0   pyridOXINE (VITAMIN B-6) 100 MG tablet, Take 100 mg by mouth daily., Disp: , Rfl:    spironolactone (ALDACTONE) 100 MG  tablet, Take by mouth., Disp: , Rfl:    SUMAtriptan (IMITREX) 50 MG tablet, Take 50 mg by mouth daily as needed., Disp: , Rfl:    TURMERIC CURCUMIN PO, Take 2 capsules by mouth daily., Disp: , Rfl:    Medications ordered in this encounter:  No orders of the defined types were placed in this encounter.    *If you need refills on other medications prior to your next appointment, please contact your pharmacy*  Follow-Up: Call back or seek an in-person evaluation if the symptoms worsen or if the condition fails to improve as anticipated.  Other Instructions Please take antibiotic as directed.  Increase fluid intake.  Use Saline nasal spray.  Take a daily multivitamin. Continue allergy medications. Take the tessalon for cough.  Place a humidifier in the bedroom.  Please call or return clinic if symptoms are not improving.  Sinusitis Sinusitis is redness, soreness, and swelling (inflammation) of the paranasal sinuses. Paranasal sinuses are air pockets within the bones of your face (beneath the eyes, the middle of the forehead, or above the eyes). In healthy paranasal sinuses, mucus is able to drain out, and air is able to circulate through them by way of your nose. However, when your paranasal sinuses are inflamed, mucus and air can become trapped. This can allow bacteria and other germs to grow and cause infection. Sinusitis can develop quickly and  last only a short time (acute) or continue over a long period (chronic). Sinusitis that lasts for more than 12 weeks is considered chronic.  CAUSES  Causes of sinusitis include: Allergies. Structural abnormalities, such as displacement of the cartilage that separates your nostrils (deviated septum), which can decrease the air flow through your nose and sinuses and affect sinus drainage. Functional abnormalities, such as when the small hairs (cilia) that line your sinuses and help remove mucus do not work properly or are not present. SYMPTOMS  Symptoms  of acute and chronic sinusitis are the same. The primary symptoms are pain and pressure around the affected sinuses. Other symptoms include: Upper toothache. Earache. Headache. Bad breath. Decreased sense of smell and taste. A cough, which worsens when you are lying flat. Fatigue. Fever. Thick drainage from your nose, which often is green and may contain pus (purulent). Swelling and warmth over the affected sinuses. DIAGNOSIS  Your caregiver will perform a physical exam. During the exam, your caregiver may: Look in your nose for signs of abnormal growths in your nostrils (nasal polyps). Tap over the affected sinus to check for signs of infection. View the inside of your sinuses (endoscopy) with a special imaging device with a light attached (endoscope), which is inserted into your sinuses. If your caregiver suspects that you have chronic sinusitis, one or more of the following tests may be recommended: Allergy tests. Nasal culture A sample of mucus is taken from your nose and sent to a lab and screened for bacteria. Nasal cytology A sample of mucus is taken from your nose and examined by your caregiver to determine if your sinusitis is related to an allergy. TREATMENT  Most cases of acute sinusitis are related to a viral infection and will resolve on their own within 10 days. Sometimes medicines are prescribed to help relieve symptoms (pain medicine, decongestants, nasal steroid sprays, or saline sprays).  However, for sinusitis related to a bacterial infection, your caregiver will prescribe antibiotic medicines. These are medicines that will help kill the bacteria causing the infection.  Rarely, sinusitis is caused by a fungal infection. In theses cases, your caregiver will prescribe antifungal medicine. For some cases of chronic sinusitis, surgery is needed. Generally, these are cases in which sinusitis recurs more than 3 times per year, despite other treatments. HOME CARE INSTRUCTIONS   Drink plenty of water. Water helps thin the mucus so your sinuses can drain more easily. Use a humidifier. Inhale steam 3 to 4 times a day (for example, sit in the bathroom with the shower running). Apply a warm, moist washcloth to your face 3 to 4 times a day, or as directed by your caregiver. Use saline nasal sprays to help moisten and clean your sinuses. Take over-the-counter or prescription medicines for pain, discomfort, or fever only as directed by your caregiver. SEEK IMMEDIATE MEDICAL CARE IF: You have increasing pain or severe headaches. You have nausea, vomiting, or drowsiness. You have swelling around your face. You have vision problems. You have a stiff neck. You have difficulty breathing. MAKE SURE YOU:  Understand these instructions. Will watch your condition. Will get help right away if you are not doing well or get worse. Document Released: 06/18/2005 Document Revised: 09/10/2011 Document Reviewed: 07/03/2011 [facility] Patient Information 2014 [place], [place].    If you have been instructed to have an in-person evaluation today at a local Urgent Care facility, please use the link below. It will take you to a list of all of our available [facility], including address, phone number and hours of operation. Please do not delay care.  [facility]  If you or a family member do not have a primary care provider, use the link below to schedule a visit and establish care. When you choose a [facility] primary care physician or advanced practice provider, you gain a long-term partner in health. Find a Primary Care Provider  Learn more about 's in-office and virtual care options: [facility]

## 2021-05-11 NOTE — Unmapped (Signed)
PA initiated for Humira 80 mg/0.18mL through [facility]. Key: [id number]

## 2021-05-12 NOTE — Unmapped (Unsigned)
Dermatology Note     Assessment and Plan:      Hidradenitis Suppurativa (HS): Hurley stage 2 - flaring some today  - Discussed the chronic, relapsing nature of this skin disease, characterized by recurring inflamed painful nodules with abscess and sinus formation, and scarring  - Explained to patient that usually early lesion can remit with medical treatment, but once sinus tracts are established treatment options are limited and surgery is the only definitive treatment; however, recurrence rate can be up to 25% even with surgery  - Continue Humira 80 mg weekly  - Continue Spironolactone 100 mg daily.  - Continue clobetasol 0.05% ointment applied to affected areas twice daily as needed.  Appropriate use and side effects discussed for spot treatment for up to 2 weeks.  - Continue Norco 5/325mg  for pain PRN q6 H  - Will consider Infliximab in the future if needed.    The patient was advised to call for an appointment should any new, changing, or symptomatic lesions develop.     RTC: ***  _________________________________________________________________      Chief Complaint     Follow-up of HS    HPI     Erica Norman is a 40 y.o. female who presents as a returning patient (last seen 01/24/2021) to [facility] for follow-up of hidradenitis suppurativa. At last visit, patient was treated with ILK-40 to the L buttock and R inguinal areas, continued Humira 80 mg weekly, spironolactone 100 mg daily, and clobetasol 0.05% ointment.    Patient reports ***    Self-reported severity (0-5): {0-5:36085}  VAS pain today: {numbers 0-10:5044}  VAS average pain for the last month: {numbers 0-10:5044}  Requiring pain medication? {YES/NO:21013}  If so, what type/frequency? ***  How often in pain?  {hspain:53295}  Level of odor (0-5): {0-5 selection:53296}  Level of itching (0-5): {0-5 selection:53296}  Dressing changes needed for drainage:{hs dressings:52675}  How much drainage: {hsdrainage:52676}  Flare in the last month (Y/N)? {YES/NO:21013}  How long ago was the last flare? {last flare:53297}  Developing new lesions? {hsnewlesion:52677}  Number of inflammatory lesions montly: {number of lesions monthly:53298}  DLQI: ***  Current treatment: ***     How helpful is the current treatment in managing the following aspects of your disease?  Not at all helpful Somewhat helpful Very helpful   Pain      Decreasing length of flares      Decreasing new lesions      Drainage      Decreasing frequency of flares      Decreasing severity of flares      Odor          The patient denies any other new or changing lesions or areas of concern.     Pertinent Past Medical History     No history of skin cancer    Disease course:  Year when symptoms first noticed: Age 52   Year of diagnosis: Age 14  Who diagnosed you? Dermatologist  Location of first symptoms: axillae  Typical involved areas include: groin  Typical number of inflammatory lesions each month at baseline (from first visit): three to five  Disease triggers: stress, sweat and menstrual cycle    Are menstrual cycles irregular when not on birth control? No.  Current form of contraception: None  Effect of hormonal contraception on disease: n/a  Flaring with menstrual cycle (before, during, or after?): during  Difficulty becoming pregnant? n/a  Pregnancy complications? never pregnant    How many children? 0  Better or worse with pregnancy?  never pregnant.  If so, worse during a specific trimester? never pregnant      Prior treatments:  Topical: Clobetasol  Systemic: Keflex, Doxycyline, Humira (helpful in the past), Clindamycin  Past surgical procedures: Yes -bilateral wide excisions of the axilla healed well since 2008 or 2009.  She had more recent excisions in the bilateral inguinal folds in 2016-2018.  These have mostly healed well but she has some persistent areas in particular along the infra abdominal fold and one in the right inguinal fold  Past laser procedures: No    Family History:   Negative for melanoma    Past Medical History, Family History, Social History, Medication List, Allergies, and Problem List were reviewed in the rooming section of Epic.     ROS: Other than symptoms mentioned in the HPI, no fevers, chills, or other skin complaints    Physical Examination     Gen: Well-appearing patient, appropriate, interactive, in no acute distress  Skin: Examination of the scalp, face, neck, chest, back, abdomen, bilateral upper and lower extremities, hands, palms, soles, nails, buttocks, and external genitalia performed today and pertinent for:       location Abscess Inflamed nodule Non-inflamed nodule Draining sinus Non-draining Sinus Hurley % scar   R axilla          L axilla          R inframammary          L inframammary          Intermammary          Pubic          R inguinal          R thigh          L inguinal          L thigh          Scrotum/Vulva          Perianal          R buttock          L buttock          Other (list)                      AN count (total sum of abscess and inflammatory nodule):  Pilonidal sinus? ***    -sites not commented on demonstrate normal findings.     Scribe's Attestation: Elsie Stain, MD obtained and performed the history, physical exam and medical decision making elements that were entered into the chart.  Signed by Minette Brine, Scribe, on ***.    {*** NOTE TO PROVIDER: PLEASE ADD ATTESTATION NOTING YOU AGREE WITH SCRIBE DOCUMENTATION}     (Approved Template 03/14/2020)

## 2021-05-17 NOTE — Unmapped (Signed)
This encounter was created in error - please disregard.

## 2021-05-17 NOTE — Unmapped (Signed)
[facility] has made a second and final attempt to reach this patient to refill the following medication:Humira.      We have left voicemails on the following phone numbers: [phone] and have sent a MyChart message.    Dates contacted: 11/11 and 11/15  Last scheduled delivery: 10/21    The patient may be at risk of non-compliance with this medication. The patient should call the [facility] at [phone]  Option 4, then Option 2 (all other specialty patients) to refill medication.    Unk Lightning   [facility] Specialty Technician

## 2021-05-23 ENCOUNTER — Encounter: Payer: Medicaid Other | Admitting: Family Medicine

## 2021-06-09 ENCOUNTER — Encounter: Payer: Medicaid Other | Admitting: Family Medicine

## 2021-07-05 ENCOUNTER — Other Ambulatory Visit: Payer: Self-pay | Admitting: Family Medicine

## 2021-07-05 DIAGNOSIS — K219 Gastro-esophageal reflux disease without esophagitis: Secondary | ICD-10-CM

## 2021-07-05 NOTE — Unmapped (Signed)
[facility] Refill Coordination Note    Specialty Medication(s) to be Shipped:   Inflammatory Disorders: Humira    Other medication(s) to be shipped: No additional medications requested for fill at this time     Erica Norman, DOB: October 09, 1980  Phone: [phone] (home)       All above HIPAA information was verified with patient.     Was a Nurse, learning disability used for this call? No    Completed refill call assessment today to schedule patient's medication shipment from the [facility] [phone]).  All relevant notes have been reviewed.     Specialty medication(s) and dose(s) confirmed: Regimen is correct and unchanged.   Changes to medications: [name] reports no changes at this time.  Changes to insurance: No  New side effects reported not previously addressed with a pharmacist or physician: None reported  Questions for the pharmacist: No    Confirmed patient received a Conservation officer, historic buildings and a Surveyor, mining with first shipment. The patient will receive a drug information handout for each medication shipped and additional FDA Medication Guides as required.       DISEASE/MEDICATION-SPECIFIC INFORMATION        For patients on injectable medications: Patient currently has 0 doses left.  Next injection is scheduled for 1/5.    SPECIALTY MEDICATION ADHERENCE     Medication Adherence    Patient reported X missed doses in the last month: 1  Specialty Medication: humira cf pen 80mg /0.50ml              Were doses missed due to medication being on hold? No        REFERRAL TO PHARMACIST     Referral to the pharmacist: Yes - routine compliance concerns. Patient has missed 1-3 doses of medication. Refills were scheduled and concern routed to pharmacist for evaluation.      SHIPPING     Shipping address confirmed in Epic.     Delivery Scheduled: Yes, Expected medication delivery date: 1/5.     Medication will be delivered via Same Day Courier to the prescription address in Epic WAM.    [name]   [facility] Specialty Technician

## 2021-07-06 DIAGNOSIS — L732 Hidradenitis suppurativa: Principal | ICD-10-CM

## 2021-07-06 MED FILL — HUMIRA(CF) PEN 80 MG/0.8 ML SUBCUTANEOUS KIT: 28 days supply | Qty: 4 | Fill #1

## 2021-07-25 ENCOUNTER — Ambulatory Visit: Admit: 2021-07-25 | Discharge: 2021-07-26 | Payer: BLUE CROSS/BLUE SHIELD

## 2021-07-25 DIAGNOSIS — Z79899 Other long term (current) drug therapy: Secondary | ICD-10-CM | POA: Diagnosis not present

## 2021-07-25 DIAGNOSIS — L732 Hidradenitis suppurativa: Secondary | ICD-10-CM | POA: Diagnosis not present

## 2021-07-25 MED ORDER — CLINDAMYCIN HCL 300 MG CAPSULE
ORAL_CAPSULE | Freq: Two times a day (BID) | ORAL | 0 refills | 30.00000 days | Status: CP
Start: 2021-07-25 — End: 2021-08-24

## 2021-07-25 MED ORDER — HYDROCODONE 5 MG-ACETAMINOPHEN 325 MG TABLET
ORAL_TABLET | Freq: Four times a day (QID) | ORAL | 0 refills | 2.00000 days | Status: CP | PRN
Start: 2021-07-25 — End: ?

## 2021-07-25 MED ORDER — SPIRONOLACTONE 100 MG TABLET
ORAL_TABLET | Freq: Every day | ORAL | 3 refills | 90.00000 days | Status: CP
Start: 2021-07-25 — End: 2022-07-25

## 2021-07-25 MED ADMIN — triamcinolone acetonide (KENALOG-40) injection 40 mg: 40 mg | INTRA_ARTICULAR | @ 22:00:00 | Stop: 2021-07-25

## 2021-07-25 NOTE — Unmapped (Signed)
[facility] Refill Coordination Note    Specialty Medication(s) to be Shipped:   Inflammatory Disorders: Humira    Other medication(s) to be shipped: No additional medications requested for fill at this time     Erica Norman, DOB: 04-18-1981  Phone: [phone] (home)       All above HIPAA information was verified with patient's family member, mom.     Was a Nurse, learning disability used for this call? No    Completed refill call assessment today to schedule patient's medication shipment from the [facility] [phone]).  All relevant notes have been reviewed.     Specialty medication(s) and dose(s) confirmed: Regimen is correct and unchanged.   Changes to medications: Ahliyah reports no changes at this time.  Changes to insurance: No  New side effects reported not previously addressed with a pharmacist or physician: None reported  Questions for the pharmacist: No    Confirmed patient received a Conservation officer, historic buildings and a Surveyor, mining with first shipment. The patient will receive a drug information handout for each medication shipped and additional FDA Medication Guides as required.       DISEASE/MEDICATION-SPECIFIC INFORMATION        For patients on injectable medications: Patient currently has 1 doses left.  Next injection is scheduled for 07/27/2021.    SPECIALTY MEDICATION ADHERENCE     Medication Adherence    Patient reported X missed doses in the last month: 0  Specialty Medication: Humira  Patient is on additional specialty medications: No  Any gaps in refill history greater than 2 weeks in the last 3 months: no  Demonstrates understanding of importance of adherence: yes  Informant: mother  Reliability of informant: reliable  Confirmed plan for next specialty medication refill: delivery by pharmacy  Refills needed for supportive medications: not needed              Were doses missed due to medication being on hold? No        REFERRAL TO PHARMACIST     Referral to the pharmacist: Not needed      [facility]     Shipping address confirmed in Epic.     Delivery Scheduled: Yes, Expected medication delivery date: 07/31/2021.     Medication will be delivered via Same Day Courier to the prescription address in Epic WAM.    Deniah Saia D Kiwanna Spraker   [facility] Specialty Technician

## 2021-07-26 ENCOUNTER — Other Ambulatory Visit: Payer: Self-pay | Admitting: Nurse Practitioner

## 2021-07-26 DIAGNOSIS — U099 Post covid-19 condition, unspecified: Secondary | ICD-10-CM

## 2021-07-26 DIAGNOSIS — R059 Cough, unspecified: Secondary | ICD-10-CM

## 2021-07-26 NOTE — Unmapped (Signed)
Dermatology Note     Assessment and Plan:      Hidradenitis Suppurativa (HS): Hurley stage 2 flaring some today  - Discussed the chronic, relapsing nature of this skin disease, characterized by recurring inflamed painful nodules with abscess and sinus formation, and scarring  - Explained to patient that usually early lesion can remit with medical treatment, but once sinus tracts are established treatment options are limited and surgery is the only definitive treatment; however, recurrence rate can be up to 25% even with surgery  - Continue Humira 80 mg weekly  - Continue Spironolactone 100 mg daily.   - Will possible start Infliximab in the future if needed.  - Continue clobetasol 0.05% ointment applied to affected areas twice daily as needed.  Appropriate use and side effects discussed for spot treatment for up to 2 weeks.  - Continue Norco 5/325mg  for pain PRN q6 H (refilled today)  - Start Clindamycin 300 mg twice daily for 14 days (prescribed a month supply)     Intralesional Kenalog Procedure Note: After the patient was informed of risks (including atrophy and dyspigmentation), benefits and side effects of intralesional steroid injection, the patient elected to undergo injection and verbal consent was obtained. Skin was cleaned with alcohol and injected intralesionally into the sites (below). The patient tolerated the procedure well without complications and was instructed on post-procedure care.  Location(s): Left inguinal crease, left labium major  Number of sites treated: 3  Kenalog (triamcinolone) Concentration: 40 mg/ml   Volume: 0.8 ml total    The patient was advised to call for an appointment should any new, changing, or symptomatic lesions develop.     RTC: As scheduled   _________________________________________________________________      Chief Complaint     Follow-up of HS    HPI     Erica Norman is a 41 y.o. female who presents as a returning patient (last seen 01/24/2021) to [facility] for follow-up of hidradenitis suppurativa. At [facility] the patient was continued on Humira and Spirnolactone. The patient states that she flares once a month and the lesions go away within 1-2 weeks. This past flare the lesions have persisted for about 3 weeks. States that she recently ran out of pain medications. States that she is having trouble moving given the lesions within her groin.  Previously she has received  ILK injections.     The patient denies any other new or changing lesions or areas of concern.     Pertinent Past Medical History     No history of skin cancer    Disease course:  Year when symptoms first noticed: Age 51   Year of diagnosis: Age 82  Who diagnosed you? Dermatologist  Location of first symptoms: axillae  Typical involved areas include: groin  Typical number of inflammatory lesions each month at baseline (from first visit): three to five  Disease triggers: stress, sweat and menstrual cycle    Are menstrual cycles irregular when not on birth control? No.  Current form of contraception: None  Effect of hormonal contraception on disease: n/a  Flaring with menstrual cycle (before, during, or after?): during  Difficulty becoming pregnant? n/a  Pregnancy complications? never pregnant    How many children? 0  Better or worse with pregnancy?  never pregnant.  If so, worse during a specific trimester? never pregnant      Prior treatments:  Topical: Clobetasol  Systemic: Keflex, Doxycyline, Humira (helpful in the past), Clindamycin  Past surgical procedures: Yes -bilateral wide  excisions of the axilla healed well since 2008 or 2009.  She had more recent excisions in the bilateral inguinal folds in 2016-2018.  These have mostly healed well but she has some persistent areas in particular along the infra abdominal fold and one in the right inguinal fold  Past laser procedures: No    Family History:   Negative for melanoma    Past Medical History, Family History, Social History, Medication List, Allergies, and Problem List were reviewed in the rooming section of Epic.     ROS: Other than symptoms mentioned in the HPI, no fevers, chills, or other skin complaints    Physical Examination     Gen: Well-appearing patient, appropriate, interactive, in no acute distress  Skin: Examination of the inguinal creases and external genitalia performed today and pertinent for:     location Abscess Inflamed nodule Non-inflamed nodule Draining sinus Non-draining Sinus Hurley BSA at site Color change  Inflamed induration Open skin surface  Tunnels   R axilla              L axilla              R inframammary              L inframammary              Intermammary              Pubic              R inguinal              R thigh              L inguinal  3            L thigh              Scrotum/Vulva  1            Perianal              R buttock              L buttock              Other (list)                            *0=none, 1=mild, 2=moderate, 3=severe    AN count (total sum of abscess and inflammatory nodule): 4  -sites not commented on demonstrate normal findings.     (Approved Template 03/14/2020)

## 2021-07-26 NOTE — Unmapped (Signed)
I saw and evaluated the patient, participating in the key elements of the service.  I discussed the findings, assessment and plan with the resident and agree with resident’s findings and plan as documented in the resident's note.  I was immediately available for the entirety of the procedure(s) and present for the key and critical portions. Dominic Rhome J Jonathan Kirkendoll, MD

## 2021-07-31 MED FILL — HUMIRA(CF) PEN 80 MG/0.8 ML SUBCUTANEOUS KIT: 28 days supply | Qty: 4 | Fill #2

## 2021-08-01 DIAGNOSIS — R058 Other specified cough: Secondary | ICD-10-CM | POA: Diagnosis not present

## 2021-08-01 DIAGNOSIS — J3489 Other specified disorders of nose and nasal sinuses: Secondary | ICD-10-CM | POA: Diagnosis not present

## 2021-08-01 DIAGNOSIS — Z9689 Presence of other specified functional implants: Secondary | ICD-10-CM | POA: Diagnosis not present

## 2021-08-01 DIAGNOSIS — R051 Acute cough: Secondary | ICD-10-CM | POA: Diagnosis not present

## 2021-08-01 DIAGNOSIS — G40119 Localization-related (focal) (partial) symptomatic epilepsy and epileptic syndromes with simple partial seizures, intractable, without status epilepticus: Secondary | ICD-10-CM | POA: Diagnosis not present

## 2021-08-01 DIAGNOSIS — Z9682 Presence of neurostimulator: Secondary | ICD-10-CM | POA: Diagnosis not present

## 2021-08-01 DIAGNOSIS — Z4549 Encounter for adjustment and management of other implanted nervous system device: Secondary | ICD-10-CM | POA: Diagnosis not present

## 2021-08-01 DIAGNOSIS — Z880 Allergy status to penicillin: Secondary | ICD-10-CM | POA: Diagnosis not present

## 2021-08-01 DIAGNOSIS — Z20822 Contact with and (suspected) exposure to covid-19: Secondary | ICD-10-CM | POA: Diagnosis not present

## 2021-08-02 ENCOUNTER — Other Ambulatory Visit: Payer: Self-pay | Admitting: Physician Assistant

## 2021-08-02 ENCOUNTER — Telehealth: Payer: Self-pay | Admitting: *Deleted

## 2021-08-02 ENCOUNTER — Encounter: Payer: Self-pay | Admitting: Family Medicine

## 2021-08-02 DIAGNOSIS — Z79899 Other long term (current) drug therapy: Secondary | ICD-10-CM

## 2021-08-02 DIAGNOSIS — D84821 Immunodeficiency due to drugs: Secondary | ICD-10-CM

## 2021-08-02 DIAGNOSIS — J0191 Acute recurrent sinusitis, unspecified: Secondary | ICD-10-CM

## 2021-08-02 MED ORDER — CEFIXIME 400 MG PO CAPS: 400.0000 mg | ORAL_CAPSULE | Freq: Every day | ORAL | 0 refills | Status: DC

## 2021-08-02 NOTE — Telephone Encounter (Signed)
Transition Care Management Follow-up Telephone Call Date of discharge and from where: 08/01/2021 [facility] ED How have you been since you were released from the hospital? "About the same" Any questions or concerns? No  Items Reviewed: Did the pt receive and understand the discharge instructions provided? Yes  Medications obtained and verified? Yes  Other? No  Any new allergies since your discharge? No  Dietary orders reviewed? No Do you have support at home? Yes   Home Care and Equipment/Supplies: Were home health services ordered? no If so, what is the name of the agency? N/A  Has the agency set up a time to come to the patient's home? not applicable Were any new equipment or medical supplies ordered?  No What is the name of the medical supply agency? N/A Were you able to get the supplies/equipment? not applicable Do you have any questions related to the use of the equipment or supplies? No  Functional Questionnaire: (I = Independent and D = Dependent) ADLs: I  Bathing/Dressing- I  Meal Prep- I  Eating- I  Maintaining continence- I  Transferring/Ambulation- I  Managing Meds- I  Follow up appointments reviewed:  PCP Hospital f/u appt confirmed? No  - advised pt that if she does not improve, she should call for an appointment Specialist Hospital f/u appt confirmed? No   Are transportation arrangements needed? No  If their condition worsens, is the pt aware to call PCP or go to the Emergency Dept.? Yes Was the patient provided with contact information for the PCP's office or ED? Yes Was to pt encouraged to call back with questions or concerns? Yes

## 2021-08-02 NOTE — Progress Notes (Signed)
Patient was seen in ED for concerns for coughing- she thought this was due to her VNS implant but this was cleared ED provided watch and wait rx for Doxycycline for patient's concerns for sinusitis. Patient states she cannot take Doxycycline due to GI issues and states she needs another abx for her symptoms that have been present since Friday Will provide 7 day course of Cefixime 400mg  PO QD - she is already taking clindamycin for hidradenitis suppurativa suppression daily per chart.  If her symptoms have not improved after this 7 day course, will require an office apt to evaluate and likely referral to otolaryngology or ENT for evaluation and potential culture to determine best course of action. This is in accordance with accepted recommendations found on Uptodate for management of acute uncomplicated rhinosinusitis.  Referral is preferable due to recurrent concerns for sinus infection within the last year.

## 2021-08-24 NOTE — Unmapped (Signed)
[facility] Refill Coordination Note    Specialty Medication(s) to be Shipped:   Inflammatory Disorders: Humira    Other medication(s) to be shipped: No additional medications requested for fill at this time     Erica Norman, DOB: [birth year]  Phone: [phone] (home)       All above HIPAA information was verified with patient's family member, mom.     Was a Nurse, learning disability used for this call? No    Completed refill call assessment today to schedule patient's medication shipment from the [facility] [phone]).  All relevant notes have been reviewed.     Specialty medication(s) and dose(s) confirmed: Regimen is correct and unchanged.   Changes to medications: Erica Norman reports no changes at this time.  Changes to insurance: No  New side effects reported not previously addressed with a pharmacist or physician: None reported  Questions for the pharmacist: No    Confirmed patient received a Conservation officer, historic buildings and a Surveyor, mining with first shipment. The patient will receive a drug information handout for each medication shipped and additional FDA Medication Guides as required.       DISEASE/MEDICATION-SPECIFIC INFORMATION        For patients on injectable medications: Patient currently has 1 doses left.  Next injection is scheduled for 08/24/2021.    SPECIALTY MEDICATION ADHERENCE     Medication Adherence    Patient reported X missed doses in the last month: 0  Specialty Medication: Humira  Patient is on additional specialty medications: No  Any gaps in refill history greater than 2 weeks in the last 3 months: no  Demonstrates understanding of importance of adherence: yes  Informant: mother  Reliability of informant: reliable  Confirmed plan for next specialty medication refill: delivery by pharmacy  Refills needed for supportive medications: not needed              Were doses missed due to medication being on hold? No        REFERRAL TO PHARMACIST     Referral to the pharmacist: Not needed      [facility]     Shipping address confirmed in Epic.     Delivery Scheduled: Yes, Expected medication delivery date: 08/25/2021.     Medication will be delivered via Same Day Courier to the prescription address in Epic WAM.    Erica Norman   [facility] Shared [facility] Specialty Technician

## 2021-08-25 MED FILL — HUMIRA(CF) PEN 80 MG/0.8 ML SUBCUTANEOUS KIT: 28 days supply | Qty: 4 | Fill #3

## 2021-09-19 NOTE — Unmapped (Signed)
The [facility] has made a second and final attempt to reach this patient to refill the following medication:Humira.      We have left voicemails on the following phone numbers: [phone] and [phone](sent texts as well).    Dates contacted: 3/17 3/21  Last scheduled delivery: 2/24    The patient may be at risk of non-compliance with this medication. The patient should call the [facility] at [phone]  Option 4, then Option 2 (all other specialty patients) to refill medication.    Bowden Boody D Administrator Shared [facility] Specialty Technician

## 2021-09-21 NOTE — Patient Instructions (Signed)

## 2021-09-25 ENCOUNTER — Encounter: Payer: Self-pay | Admitting: Family Medicine

## 2021-09-25 ENCOUNTER — Ambulatory Visit: Payer: Medicaid Other | Admitting: Family Medicine

## 2021-09-25 VITALS — BP 112/70 | HR 96 | Temp 98.0°F | Resp 16 | Ht 65.0 in | Wt 236.0 lb

## 2021-09-25 DIAGNOSIS — E785 Hyperlipidemia, unspecified: Secondary | ICD-10-CM

## 2021-09-25 DIAGNOSIS — Z0000 Encounter for general adult medical examination without abnormal findings: Secondary | ICD-10-CM | POA: Diagnosis not present

## 2021-09-25 DIAGNOSIS — Z124 Encounter for screening for malignant neoplasm of cervix: Secondary | ICD-10-CM

## 2021-09-25 DIAGNOSIS — R0683 Snoring: Secondary | ICD-10-CM | POA: Diagnosis not present

## 2021-09-25 DIAGNOSIS — Z1231 Encounter for screening mammogram for malignant neoplasm of breast: Secondary | ICD-10-CM | POA: Diagnosis not present

## 2021-09-25 DIAGNOSIS — N895 Stricture and atresia of vagina: Secondary | ICD-10-CM

## 2021-09-25 DIAGNOSIS — G43909 Migraine, unspecified, not intractable, without status migrainosus: Secondary | ICD-10-CM | POA: Diagnosis not present

## 2021-09-25 DIAGNOSIS — M25571 Pain in right ankle and joints of right foot: Secondary | ICD-10-CM

## 2021-09-25 DIAGNOSIS — G40909 Epilepsy, unspecified, not intractable, without status epilepticus: Secondary | ICD-10-CM | POA: Diagnosis not present

## 2021-09-25 DIAGNOSIS — K219 Gastro-esophageal reflux disease without esophagitis: Secondary | ICD-10-CM

## 2021-09-25 DIAGNOSIS — Z9689 Presence of other specified functional implants: Secondary | ICD-10-CM | POA: Diagnosis not present

## 2021-09-25 LAB — COMPLETE METABOLIC PANEL WITH GFR
AG Ratio: 1.2 (calc) (ref 1.0–2.5)
ALT: 15 U/L (ref 6–29)
AST: 19 U/L (ref 10–30)
Albumin: 4.1 g/dL (ref 3.6–5.1)
Alkaline phosphatase (APISO): 88 U/L (ref 31–125)
BUN: 13 mg/dL (ref 7–25)
CO2: 28 mmol/L (ref 20–32)
Calcium: 9.7 mg/dL (ref 8.6–10.2)
Chloride: 103 mmol/L (ref 98–110)
Creat: 0.65 mg/dL (ref 0.50–0.99)
Globulin: 3.3 g/dL (calc) (ref 1.9–3.7)
Glucose, Bld: 87 mg/dL (ref 65–99)
Potassium: 4.8 mmol/L (ref 3.5–5.3)
Sodium: 140 mmol/L (ref 135–146)
Total Bilirubin: 0.4 mg/dL (ref 0.2–1.2)
Total Protein: 7.4 g/dL (ref 6.1–8.1)
eGFR: 113 mL/min/{1.73_m2} (ref >=60)

## 2021-09-25 LAB — CBC WITH DIFFERENTIAL/PLATELET
Absolute Monocytes: 819 cells/uL (ref 200–950)
Basophils Absolute: 76 cells/uL (ref 0–200)
Basophils Relative: 0.6 %
Eosinophils Absolute: 466 cells/uL (ref 15–500)
Eosinophils Relative: 3.7 %
HCT: 40.9 % (ref 35.0–45.0)
Hemoglobin: 13.5 g/dL (ref 11.7–15.5)
Lymphs Abs: 3037 cells/uL (ref 850–3900)
MCH: 29.7 pg (ref 27.0–33.0)
MCHC: 33 g/dL (ref 32.0–36.0)
MCV: 90.1 fL (ref 80.0–100.0)
MPV: 9.8 fL (ref 7.5–12.5)
Monocytes Relative: 6.5 %
Neutro Abs: 8203 cells/uL — ABNORMAL HIGH (ref 1500–7800)
Neutrophils Relative %: 65.1 %
Platelets: 544 10*3/uL — ABNORMAL HIGH (ref 140–400)
RBC: 4.54 10*6/uL (ref 3.80–5.10)
RDW: 12.7 % (ref 11.0–15.0)
Total Lymphocyte: 24.1 %
WBC: 12.6 10*3/uL — ABNORMAL HIGH (ref 3.8–10.8)

## 2021-09-25 LAB — LIPID PANEL
Cholesterol: 226 mg/dL — ABNORMAL HIGH (ref <200)
HDL: 47 mg/dL — ABNORMAL LOW (ref >=50)
LDL Cholesterol (Calc): 150 mg/dL (calc) — ABNORMAL HIGH
Non-HDL Cholesterol (Calc): 179 mg/dL (calc) — ABNORMAL HIGH (ref <130)
Total CHOL/HDL Ratio: 4.8 (calc) (ref <5.0)
Triglycerides: 154 mg/dL — ABNORMAL HIGH (ref <150)

## 2021-09-25 NOTE — Progress Notes (Signed)
? ? ?Patient: Destiny Flores, Female    DOB: 23-Oct-1980, 41 y.o.   MRN: [id number] ?Delsa Grana, PA-C ?Visit Date: 09/25/2021 ? ?Today's Provider: Delsa Grana, PA-C  ? ?Chief Complaint  ?Patient presents with  ? Annual Exam  ? ?Subjective:  ? ?Annual physical exam: ? ?Destiny Flores is a 41 y.o. female who presents today for health maintenance and annual & complete physical exam.  ? ?Exercise/Activity:  5 d 20 min trying to ?Diet/nutrition:  trying to watch what she eats- did nutritional consult- working on portions ?Sleep:  sleeping well no concerns ? ?Breast CA screening due ?Regular menses - monthly, lasts a week/7d, heaviest days changes pads every 3-4 hours ?No urinary incontinence - pelvic exam in the past uncomfortable and past trauma - no recent pap ? ?Paternal grandmother breast CA, no other CA in family, denies ovarian, uterine, colon and breast CA ? ? ?USPSTF grade A and B recommendations - reviewed and addressed today ? ?Depression:  ?Phq 9 completed today by patient, was reviewed by me with patient in the room, score is  negative, pt feels mood is good ? ?  09/25/2021  ? 10:35 AM 03/21/2021  ?  1:50 PM 02/28/2021  ?  1:12 PM  ?Depression screen PHQ 2/9  ?Decreased Interest 0 1 0  ?Down, Depressed, Hopeless 0 1 0  ?PHQ - 2 Score 0 2 0  ?Altered sleeping 0 1 0  ?Tired, decreased energy 0 0 0  ?Change in appetite 0 0 0  ?Feeling bad or failure about yourself  0 0 0  ?Trouble concentrating 0 0 0  ?Moving slowly or fidgety/restless 0 0 0  ?Suicidal thoughts 0 0 0  ?PHQ-9 Score 0 3 0  ?Difficult doing work/chores Not difficult at all Not difficult at all Not difficult at all  ? ? ?Hep C Screening: done in the past ? ?STD testing and prevention (HIV/chl/gon/syphilis): done - no risk or exposure ? ?Intimate partner violence:safe at home ? ?Advanced Care Planning:  ?A voluntary discussion about advance care planning including the explanation and discussion of advance directives.  Discussed health care proxy and Living  will, and the patient was able to identify a health care proxy as mother - .  Patient does not have a living will at present time. If patient does have living will, I have requested they bring this to the clinic to be scanned in to their chart. ?Packet given to pt today ? ?Health Maintenance  ?Topic Date Due  ? PAP SMEAR-Modifier  Never done  ? MAMMOGRAM  08/04/2021  ? COVID-19 Vaccine (5 - Booster for Moderna series) 10/11/2021 (Originally 06/10/2021)  ? TETANUS/TDAP  03/06/2028  ? INFLUENZA VACCINE  Completed  ? Hepatitis C Screening  Completed  ? HIV Screening  Completed  ? HPV VACCINES  Aged Out  ? ? ?Skin cancer:  sees dermatology  Pt reports no hx of skin cancer, suspicious lesions/biopsies in the past. ? ?Colorectal cancer:  colonoscopy is not indicated per age ?Discussed USPSTF and different ways to screen ( Cologuard, Colonoscopy versus hemoccult stools) ?[employer] [facility] recommends screen of AA at age 42  ?Pt denies melena, hematochezia, change in BM pattern or caliber  ? ? ?Lung cancer:   Low Dose CT Chest recommended if Age 16-80 years, 20 pack-year currently smoking OR have quit w/in 15years. Patient does not qualify.   ?Social History  ? ?Tobacco Use  ? Smoking status: Never  ? Smokeless tobacco: Never  ?Substance Use  Topics  ? Alcohol use: Never  ?  ? ?Alcohol screening: ?Flowsheet Row Video Visit from 09/28/2020 in [facility]  ?AUDIT-C Score 0  ? ?  ? ? ? ? ?Blood pressure/Hypertension: ?BP Readings from Last 3 Encounters:  ?09/25/21 112/70  ?04/24/21 (!) 123/92  ?03/21/21 112/68  ? ?Weight/Obesity: ?Wt Readings from Last 3 Encounters:  ?09/25/21 236 lb (107 kg)  ?04/24/21 230 lb (104.3 kg)  ?03/21/21 237 lb 1.6 oz (107.5 kg)  ? ?BMI Readings from Last 3 Encounters:  ?09/25/21 39.27 kg/m?  ?04/24/21 39.48 kg/m?  ?03/21/21 39.46 kg/m?  ?  ?Lipids:  ?Lab Results  ?Component Value Date  ? CHOL 218 (H) 05/17/2020  ? CHOL 206 (H) 04/10/2019  ? CHOL 215 (H)  03/06/2018  ? ?Lab Results  ?Component Value Date  ? HDL 54 05/17/2020  ? HDL 48 (L) 04/10/2019  ? HDL 51 03/06/2018  ? ?Lab Results  ?Component Value Date  ? LDLCALC 141 (H) 05/17/2020  ? LDLCALC 132 (H) 04/10/2019  ? LDLCALC 134 (H) 03/06/2018  ? ?Lab Results  ?Component Value Date  ? TRIG 110 05/17/2020  ? TRIG 146 04/10/2019  ? TRIG 160 (H) 03/06/2018  ? ?Lab Results  ?Component Value Date  ? CHOLHDL 4.0 05/17/2020  ? CHOLHDL 4.3 04/10/2019  ? CHOLHDL 4.2 03/06/2018  ? ?No results found for: LDLDIRECT ?Based on the results of lipid panel his/her cardiovascular risk factor ( using [facility] )  in the next 10 years is : ?The 10-year ASCVD risk score (Arnett DK, et al., 2019) is: 0.6% ?  Values used to calculate the score: ?    Age: 41 years ?    Sex: Female ?    Is Non-Hispanic African American: No ?    Diabetic: No ?    Tobacco smoker: No ?    Systolic Blood Pressure: [id number] mmHg ?    Is BP treated: No ?    HDL Cholesterol: 54 mg/dL ?    Total Cholesterol: 218 mg/dL ?Glucose:  ?Glucose, Bld  ?Date Value Ref Range Status  ?08/17/2020 81 65 - 99 mg/dL Final  ?  Comment:  ?  . ?           Fasting reference interval ?. ?  ?05/17/2020 83 65 - 99 mg/dL Final  ?  Comment:  ?  . ?           Fasting reference interval ?. ?  ?04/10/2019 87 65 - 99 mg/dL Final  ?  Comment:  ?  . ?           Fasting reference interval ?. ?  ? ? ?Social History ?      ?Social History  ? ?Socioeconomic History  ? Marital status: Single  ?  Spouse name: Not on file  ? Number of children: 0  ? Years of education: 58  ? Highest education level: Bachelor's degree (e.g., BA, AB, BS)  ?Occupational History  ? Occupation: unemployeed  ?Tobacco Use  ? Smoking status: Never  ? Smokeless tobacco: Never  ?Vaping Use  ? Vaping Use: Never used  ?Substance and Sexual Activity  ? Alcohol use: Never  ? Drug use: Never  ? Sexual activity: Never  ?  Partners: Male  ?Other Topics Concern  ? Not on file  ?Social History Narrative  ? Not on file  ? ?Social  Determinants of Health  ? ?Financial Resource Strain: Low Risk   ? Difficulty of Paying Living  Expenses: Not hard at all  ?Food Insecurity: No Food Insecurity  ? Worried About Charity fundraiser in the Last Year: Never true  ? Ran Out of Food in the Last Year: Never true  ?Transportation Needs: No Transportation Needs  ? Lack of Transportation (Medical): No  ? Lack of Transportation (Non-Medical): No  ?Physical Activity: Insufficiently Active  ? Days of Exercise per Week: 5 days  ? Minutes of Exercise per Session: 20 min  ?Stress: Stress Concern Present  ? Feeling of Stress : To some extent  ?Social Connections: Moderately Isolated  ? Frequency of Communication with Friends and Family: Twice a week  ? Frequency of Social Gatherings with Friends and Family: Twice a week  ? Attends Religious Services: 1 to 4 times per year  ? Active Member of Clubs or Organizations: No  ? Attends Archivist Meetings: Never  ? Marital Status: Never married  ? ? ?Family History  ?      ?Family History  ?Problem Relation Age of Onset  ? Hyperlipidemia Father   ? Epilepsy Brother   ? ADD / ADHD Brother   ? Breast cancer Paternal Grandmother 2  ?     Passed away at 74yo  ? Asthma Mother   ? ? ?Patient Active Problem List  ? Diagnosis Date Noted  ? Allergic rhinitis 10/31/2020  ? 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 Thrombocytosis 05/17/2020  ? Hyperlipidemia 05/17/2020  ? Gastroesophageal reflux disease without esophagitis 05/17/2020  ? Chronic sinusitis 05/17/2020  ? Morbid obesity with BMI of 40.0-44.9, adult ([place]) 07/14/2019  ? 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 Vitamin D deficiency 03/06/2018  ? Cerebral palsy ([place]) 12/24/2017  ? Epilepsy ([place]) 12/24/2017  ? Hidradenitis suppurativa 12/24/2017  ? 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 ? ?Past Surgical History:  ?Procedure Laterality Date  ? abscess surgery Bilateral 2006  ? armpits and groin.12 surgeries overall.   ?  AXILLARY HIDRADENITIS EXCISION    ? MULTIPLE HYDRADENITIS SURGERIES IN [place]  ? hidradenitis groin Left 04/2017  ? last surgery done on groin and the symptoms have returned  ? HYDRADENITIS EXCISION N/A 01/16/2018

## 2021-09-28 ENCOUNTER — Ambulatory Visit
Admission: RE | Admit: 2021-09-28 | Discharge: 2021-09-28 | Disposition: A | Discharge status: Home / Self Care | Payer: Medicaid Other | Attending: Family Medicine | Admitting: Family Medicine

## 2021-09-28 ENCOUNTER — Ambulatory Visit
Admission: RE | Admit: 2021-09-28 | Discharge: 2021-09-28 | Disposition: A | Discharge status: Home / Self Care | Payer: Medicaid Other | Source: Ambulatory Visit | Attending: Family Medicine | Admitting: Family Medicine

## 2021-09-28 DIAGNOSIS — M25571 Pain in right ankle and joints of right foot: Secondary | ICD-10-CM | POA: Insufficient documentation

## 2021-09-28 DIAGNOSIS — M7989 Other specified soft tissue disorders: Secondary | ICD-10-CM | POA: Diagnosis not present

## 2021-09-29 ENCOUNTER — Telehealth: Payer: Self-pay

## 2021-09-29 NOTE — Telephone Encounter (Signed)
CALLED PATIENT NO ANSWER LEFT VOICEMAIL FOR A CALL BACK LETTER SENT 

## 2021-10-02 ENCOUNTER — Ambulatory Visit: Admit: 2021-10-02 | Discharge: 2021-10-03 | Payer: BLUE CROSS/BLUE SHIELD

## 2021-10-02 DIAGNOSIS — L732 Hidradenitis suppurativa: Principal | ICD-10-CM

## 2021-10-02 DIAGNOSIS — Z79899 Other long term (current) drug therapy: Principal | ICD-10-CM

## 2021-10-02 MED ORDER — CLINDAMYCIN HCL 300 MG CAPSULE
ORAL_CAPSULE | Freq: Two times a day (BID) | ORAL | 5 refills | 14 days | Status: CP
Start: 2021-10-02 — End: 2021-10-16

## 2021-10-02 MED ORDER — SPIRONOLACTONE 100 MG TABLET
ORAL_TABLET | Freq: Every day | ORAL | 3 refills | 90.00000 days | Status: CP
Start: 2021-10-02 — End: 2022-10-02

## 2021-10-02 MED ADMIN — triamcinolone acetonide (KENALOG-40) injection 40 mg: 40 mg | @ 12:00:00 | Stop: 2021-10-02

## 2021-10-02 NOTE — Unmapped (Signed)
I saw and evaluated the patient, participating in the key elements of the service.  I discussed the findings, assessment and plan with the resident and agree with resident’s findings and plan as documented in the resident's note.  I was immediately available for the entirety of the procedure(s) and present for the key and critical portions. Promise Bushong J Palmer Fahrner, MD

## 2021-10-02 NOTE — Unmapped (Signed)
Dermatology Note     Assessment and Plan:      Hidradenitis Suppurativa (HS): Hurley stage 2, chronic, improved from last visit but still flaring, not at patient goal  - Discussed the chronic, relapsing nature of this skin disease, characterized by recurring inflamed painful nodules with abscess and sinus formation, and scarring  - Explained to patient that usually early lesion can remit with medical treatment, but once sinus tracts are established treatment options are limited and surgery is the only definitive treatment; however, recurrence rate can be up to 25% even with surgery  - patient prefers to avoid infliximab due to side effects  - jointly decided to wait on surgery for now  - Continue Humira 80 mg weekly, no side effects noted  - given flares with menses, Increase spironolactone (ALDACTONE) 100 MG tablet; Take 2 tablets (200 mg total) by mouth daily. If you develop lightheadedness, decrease to 1.5 tablets daily  - Continue clobetasol 0.05% ointment applied to affected areas twice daily as needed.  Appropriate use and side effects discussed for spot treatment for up to 2 weeks. No refill needed  - For flares: clindamycin (CLEOCIN) 300 MG capsule; Take 1 capsule (300 mg total) by mouth two (2) times a day for 14 days. Take for flares of HS. Take with probiotic. Stop if diarrhea develops.    Intralesional Kenalog Procedure Note: After the patient was informed of risks (including atrophy and dyspigmentation), benefits and side effects of intralesional steroid injection, the patient elected to undergo injection and verbal consent was obtained. Skin was cleaned with alcohol and injected intralesionally into the sites (below). The patient tolerated the procedure well without complications and was instructed on post-procedure care.  Location(s): left inguinal crease, left medial buttock just lateral to left inferior vulva  Number of sites treated: 2  Kenalog (triamcinolone) Concentration: 40 mg/ml   Volume: 0.2 x 2 ml total    High risk medication use, adalimumab  - quantiferon gold negative 03/21/21    The patient was advised to call for an appointment should any new, changing, or symptomatic lesions develop.     RTC: Return in about 4 months (around 02/01/2022). or sooner as needed   _________________________________________________________________      Chief Complaint     Chief Complaint   Patient presents with   ??? Hidradenitis suppurativa      HS follow up.  Pt currently having flares in groin area.        HPI     Erica Norman is a 41 y.o. female who presents as a returning patient (last seen 07/25/2021) to Dermatology for follow up of HS. HS is doing ok. Having flaring in the same spots in the groin. 2/10 in severity. Flare around menses twice per month. No side effects from adalimumab and it is helping suppress the HS. No side effects from spironolactone (caused polyuria when first started, but no longer); it is also helpful. No kidney problems or medications that raised potassium. Took clindamycin with no GI upset (took it with probiotic). It was helpful to reduce the flare. Uses clobetasol on flaring areas. Patient investigated infliximab and prefers to avoid it due to side effects.    Self-reported severity (0-5): 2  VAS pain today: 2  VAS average pain for the last month: 2  Requiring pain medication? Yes.  If so, what type/frequency? Tylenol, motrin  How often in pain?  few times a month  Level of odor (0-5): 1  Level of itching (0-5):  2  Dressing changes needed for drainage:Once a week  How much drainage: no drainage  Flare in the last month (Y/N)? No.  How long ago was the last flare? n/a  Developing new lesions? less than monthly  Number of inflammatory lesions montly: 1-3  DLQI: 9  Current treatment: adalimumab, clindamycin PO, clobetasol, spironolactone     How helpful is the current treatment in managing the following aspects of your disease?  Not at all helpful Somewhat helpful Very helpful   Pain  x Decreasing length of flares  x    Decreasing new lesions  x    Drainage x     Decreasing frequency of flares  x    Decreasing severity of flares  x    Odor  x        The patient denies any other new or changing lesions or areas of concern.     Pertinent Past Medical History       Problem List        Musculoskeletal and Integument    Hidradenitis suppurativa     Disease course:  Year when symptoms first noticed: Age 41   Year of diagnosis: Age 41  Who diagnosed you? Dermatologist  Location of first symptoms: axillae  Typical involved areas include: groin  Typical number of inflammatory lesions each month at baseline (from first visit): three to five  Disease triggers: stress, sweat and menstrual cycle  ??  Are menstrual cycles irregular when not on birth control? No.  Current form of contraception: None  Effect of hormonal contraception on disease: n/a  Flaring with menstrual cycle (before, during, or after?): during  Difficulty becoming pregnant? n/a  Pregnancy complications? never pregnant    How many children? 0  Better or worse with pregnancy?  never pregnant.  If so, worse during a specific trimester? never pregnant    ??  Prior treatments:  Topical: Clobetasol  Systemic: Keflex, Doxycyline, Humira (helpful in the past), Clindamycin  Past surgical procedures: Yes -bilateral wide excisions of the axilla healed well since 2008 or 2009.  She had more recent excisions in the bilateral inguinal folds in 2016-2018.  These have mostly healed well but she has some persistent areas in particular along the infra abdominal fold and one in the right inguinal fold  Past laser procedures: No         Relevant Medications    spironolactone (ALDACTONE) 100 MG tablet    clindamycin (CLEOCIN) 300 MG capsule    triamcinolone acetonide (KENALOG-40) injection 40 mg (Completed) (Start on 10/02/2021  9:00 AM)     Past Medical History, Family History, Social History, Medication List, Allergies, and Problem List were reviewed in the rooming section of Epic.     ROS: Other than symptoms mentioned in the HPI, no fevers, chills, or other skin complaints    Physical Examination     GENERAL: Well-appearing female in no acute distress, resting comfortably.  NEURO: Alert and oriented, answers questions appropriately  SKIN (Focal Skin Exam): Per patient request, examination of groin was performed    two inflammatory nodules on left inguinal crease (within scar from past procedure) and left medial buttock lateral to left inferior vulva  Bilateral inguinal crease scars  All areas not commented on are within normal limits or unremarkable      (Approved Template 03/14/2020)

## 2021-10-02 NOTE — Unmapped (Signed)
Self-reported severity (0-5): 2  VAS pain today: 2  VAS average pain for the last month: 2  Requiring pain medication? Yes.  If so, what type/frequency? Tylenol, motrin  How often in pain?  few times a month  Level of odor (0-5): 1  Level of itching (0-5): 2  Dressing changes needed for drainage:Once a week  How much drainage: no drainage  Flare in the last month (Y/N)? No.  How long ago was the last flare? -  Developing new lesions? less than monthly  Number of inflammatory lesions montly: 1-3  DLQI: 9  Current treatment: -  How helpful is the current treatment in managing the following aspects of your disease?  Not at all helpful Somewhat helpful Very helpful   Pain  x    Decreasing length of flares  x    Decreasing new lesions  x    Drainage x     Decreasing frequency of flares  x    Decreasing severity of flares  x    Odor  x

## 2021-10-11 ENCOUNTER — Encounter: Payer: Self-pay | Admitting: Obstetrics and Gynecology

## 2021-10-16 ENCOUNTER — Emergency Department: Payer: Medicaid Other

## 2021-10-16 ENCOUNTER — Other Ambulatory Visit: Payer: Self-pay

## 2021-10-16 ENCOUNTER — Emergency Department
Admission: EM | Admit: 2021-10-16 | Discharge: 2021-10-16 | Disposition: A | Discharge status: Home / Self Care | Payer: Medicaid Other | Attending: Emergency Medicine | Admitting: Emergency Medicine

## 2021-10-16 ENCOUNTER — Encounter: Payer: Self-pay | Admitting: Emergency Medicine

## 2021-10-16 DIAGNOSIS — N201 Calculus of ureter: Secondary | ICD-10-CM | POA: Insufficient documentation

## 2021-10-16 DIAGNOSIS — N281 Cyst of kidney, acquired: Secondary | ICD-10-CM | POA: Diagnosis not present

## 2021-10-16 DIAGNOSIS — N200 Calculus of kidney: Secondary | ICD-10-CM | POA: Diagnosis not present

## 2021-10-16 DIAGNOSIS — M545 Low back pain, unspecified: Secondary | ICD-10-CM | POA: Diagnosis present

## 2021-10-16 DIAGNOSIS — K5730 Diverticulosis of large intestine without perforation or abscess without bleeding: Secondary | ICD-10-CM | POA: Diagnosis not present

## 2021-10-16 DIAGNOSIS — I878 Other specified disorders of veins: Secondary | ICD-10-CM | POA: Diagnosis not present

## 2021-10-16 LAB — URINALYSIS, ROUTINE W REFLEX MICROSCOPIC
Bilirubin Urine: NEGATIVE
Glucose, UA: NEGATIVE mg/dL
Ketones, ur: NEGATIVE mg/dL
Leukocytes,Ua: NEGATIVE
Nitrite: NEGATIVE
Protein, ur: NEGATIVE mg/dL
Specific Gravity, Urine: 1.002 — ABNORMAL LOW (ref 1.005–1.030)
pH: 7 (ref 5.0–8.0)

## 2021-10-16 LAB — BASIC METABOLIC PANEL
Anion gap: 9 (ref 5–15)
BUN: 11 mg/dL (ref 6–20)
CO2: 21 mmol/L — ABNORMAL LOW (ref 22–32)
Calcium: 9 mg/dL (ref 8.9–10.3)
Chloride: 105 mmol/L (ref 98–111)
Creatinine, Ser: 0.63 mg/dL (ref 0.44–1.00)
GFR, Estimated: >60 mL/min (ref >60)
Glucose, Bld: 99 mg/dL (ref 70–99)
Potassium: 3.7 mmol/L (ref 3.5–5.1)
Sodium: 135 mmol/L (ref 135–145)

## 2021-10-16 LAB — CBC
HCT: 40 % (ref 36.0–46.0)
Hemoglobin: 13.2 g/dL (ref 12.0–15.0)
MCH: 29.5 pg (ref 26.0–34.0)
MCHC: 33 g/dL (ref 30.0–36.0)
MCV: 89.3 fL (ref 80.0–100.0)
Platelets: 517 10*3/uL — ABNORMAL HIGH (ref 150–400)
RBC: 4.48 MIL/uL (ref 3.87–5.11)
RDW: 13.2 % (ref 11.5–15.5)
WBC: 14.3 10*3/uL — ABNORMAL HIGH (ref 4.0–10.5)
nRBC: 0 % (ref 0.0–0.2)

## 2021-10-16 LAB — POC URINE PREG, ED: Preg Test, Ur: NEGATIVE

## 2021-10-16 MED ORDER — OXYCODONE-ACETAMINOPHEN 7.5-325 MG PO TABS
1.0000 | ORAL_TABLET | Freq: Once | ORAL | Status: AC
  Administered 2021-10-16: 1 via ORAL
  Filled 2021-10-16: qty 1

## 2021-10-16 MED ORDER — KETOROLAC TROMETHAMINE 30 MG/ML IJ SOLN
30.0000 mg | Freq: Once | INTRAMUSCULAR | Status: AC
  Administered 2021-10-16: 30 mg via INTRAMUSCULAR
  Filled 2021-10-16: qty 1

## 2021-10-16 MED ORDER — METHOCARBAMOL 500 MG PO TABS
1000.0000 mg | ORAL_TABLET | Freq: Once | ORAL | Status: AC
  Administered 2021-10-16: 1000 mg via ORAL
  Filled 2021-10-16: qty 2

## 2021-10-16 NOTE — Discharge Instructions (Signed)
Continue with Vicodin as needed for your pain.  Follow-up with your primary care provider if any continued problems or concerns.  They may also want to refer you to a urologist if you continue to have kidney stones. ?

## 2021-10-16 NOTE — ED Provider Notes (Signed)
? ?[facility] ?Provider Note ? ? ? Event Date/Time  ? First MD Initiated Contact with Patient 10/16/21 [phone]   ?  (approximate) ? ? ?History  ? ?Flank Pain ? ? ?HPI ? ?Destiny Flores is a 41 y.o. female   presents to the ED with complaint of left lower back pain that started yesterday afternoon when she was on the floor chasing her nephew that was in a diaper.  Patient has history of kidney stones.  She states yesterday she noticed that her urine was getting very dark and cloudy.  She denies any nausea, vomiting, fever or chills.  Patient has taken a Vicodin that she had at home with minimal relief of her pain.  Currently she rates it at as an 8 out of 10.  In addition to kidney stones patient has a history of cerebral palsy, GERD, epilepsy, developmental delay mild, thrombocytosis ? ?  ? ? ?Physical Exam  ? ?Triage Vital Signs: ?ED Triage Vitals  ?Enc Vitals Group  ?   BP 10/16/21 0928 124/64  ?   Pulse Rate 10/16/21 0928 91  ?   Resp 10/16/21 0928 18  ?   Temp 10/16/21 0928 (!) 97.5 ?F (36.4 ?C)  ?   Temp Source 10/16/21 0928 Oral  ?   SpO2 10/16/21 0928 97 %  ?   Weight 10/16/21 0929 229 lb (103.9 kg)  ?   Height 10/16/21 0929 5\' 4"  (1.626 m)  ?   Head Circumference --   ?   Peak Flow --   ?   Pain Score 10/16/21 0928 8  ?   Pain Loc --   ?   Pain Edu? --   ?   Excl. in [place]? --   ? ? ?Most recent vital signs: ?Vitals:  ? 10/16/21 0928 10/16/21 1159  ?BP: 124/64 128/60  ?Pulse: 91 88  ?Resp: 18 18  ?Temp: (!) 97.5 ?F (36.4 ?C)   ?SpO2: 97% 98%  ? ? ? ?General: Awake, no distress.  Slow with range of motion and guarding left lower back and hip area. ?CV:  Good peripheral perfusion.  Heart regular rate and rhythm. ?Resp:  Normal effort.  Lungs are clear bilaterally. ?Abd:  No distention.  Soft, nontender, bowel sounds normoactive x4 quadrants. ?Other:  On examination of the back there is no gross deformity.  No CVA tenderness is appreciated.  There is tenderness on palpation of the left sacral  and SI joint area. ? ? ?ED Results / Procedures / Treatments  ? ?Labs ?(all labs ordered are listed, but only abnormal results are displayed) ?Labs Reviewed  ?URINALYSIS, ROUTINE W REFLEX MICROSCOPIC - Abnormal; Notable for the following components:  ?    Result Value  ? Color, Urine COLORLESS (*)   ? APPearance CLEAR (*)   ? Specific Gravity, Urine 1.002 (*)   ? Hgb urine dipstick MODERATE (*)   ? Bacteria, UA RARE (*)   ? All other components within normal limits  ?BASIC METABOLIC PANEL - Abnormal; Notable for the following components:  ? CO2 21 (*)   ? All other components within normal limits  ?CBC - Abnormal; Notable for the following components:  ? WBC 14.3 (*)   ? Platelets 517 (*)   ? All other components within normal limits  ?POC URINE PREG, ED  ? ? ? ?RADIOLOGY ?CT renal stone study shows most likely that patient has recently passed a stone on the left.  Bilateral ovarian cyst and  bilateral nephrolithiasis per radiologist. ? ? ? ?PROCEDURES: ? ?Critical Care performed:  ? ?Procedures ? ? ?MEDICATIONS ORDERED IN ED: ?Medications  ?ketorolac (TORADOL) 30 MG/ML injection 30 mg (30 mg Intramuscular Given 10/16/21 1055)  ?oxyCODONE-acetaminophen (PERCOCET) 7.5-325 MG per tablet 1 tablet (1 tablet Oral Given 10/16/21 1055)  ?methocarbamol (ROBAXIN) tablet 1,000 mg (1,000 mg Oral Given 10/16/21 1055)  ? ? ? ?IMPRESSION / MDM / ASSESSMENT AND PLAN / ED COURSE  ?I reviewed the triage vital signs and the nursing notes. ? ? ?Differential diagnosis includes, but is not limited to, left flank pain, left urolithiasis, musculoskeletal strain, urinary tract infection. ? ?41 year old female presents to the ED with complaint of left flank pain.  Patient states she has history of kidney stones in the past.  She has taken Vicodin last night with minimal relief.  She states that she is unable to get comfortable.  She is tender on palpation of the lower lumbar and left SI joint musculoskeletal area.  Range of motion is guarded  secondary to increased pain.  Patient was given Toradol 30 mg IM, Percocet 7.5 and methocarbamol 1000 mg p.o.  Urinalysis was unremarkable, pregnancy test negative, BMP negative.  CBC was slightly elevated at 14.3.  CT renal study indicates that patient most likely had a recent stone that passed on the left.  Patient was also made aware that she has bilateral nephrolithiasis and bilateral ovarian cyst.  She has a bottle of Vicodin with her.  She is encouraged to continue taking that if needed and to increase fluids to stay hydrated.  She is to follow-up with her PCP if any continued problems. ? ? ? ?FINAL CLINICAL IMPRESSION(S) / ED DIAGNOSES  ? ?Final diagnoses:  ?Ureterolithiasis  ? ? ? ?Rx / DC Orders  ? ?ED Discharge Orders   ? ? None  ? ?  ? ? ? ?Note:  This document was prepared using Dragon voice recognition software and may include unintentional dictation errors. ?  ?Johnn Hai, PA-C ?10/16/21 1335 ? ?  ?Lavonia Drafts, MD ?10/16/21 1554 ? ?

## 2021-10-16 NOTE — ED Triage Notes (Signed)
Pt via POV from home. Pt c/o L sided flank pain that started yesterday afternoon. Pt has a hx of kidney. Denies NVD. Denies urinary symptoms. Pt is A&OX4 and NAD.  ?

## 2021-10-17 ENCOUNTER — Ambulatory Visit: Payer: Medicaid Other | Admitting: Family Medicine

## 2021-10-17 ENCOUNTER — Encounter: Payer: Self-pay | Admitting: Family Medicine

## 2021-10-17 VITALS — BP 102/64 | HR 101 | Temp 97.7°F | Resp 16 | Ht 65.0 in | Wt 232.9 lb

## 2021-10-17 DIAGNOSIS — Z87442 Personal history of urinary calculi: Secondary | ICD-10-CM | POA: Diagnosis not present

## 2021-10-17 DIAGNOSIS — N83201 Unspecified ovarian cyst, right side: Secondary | ICD-10-CM

## 2021-10-17 DIAGNOSIS — Z09 Encounter for follow-up examination after completed treatment for conditions other than malignant neoplasm: Secondary | ICD-10-CM

## 2021-10-17 DIAGNOSIS — R109 Unspecified abdominal pain: Secondary | ICD-10-CM

## 2021-10-17 DIAGNOSIS — R319 Hematuria, unspecified: Secondary | ICD-10-CM | POA: Diagnosis not present

## 2021-10-17 DIAGNOSIS — N83202 Unspecified ovarian cyst, left side: Secondary | ICD-10-CM

## 2021-10-17 MED ORDER — TAMSULOSIN HCL 0.4 MG PO CAPS: 0.4000 mg | ORAL_CAPSULE | Freq: Every day | ORAL | 3 refills | Status: DC

## 2021-10-17 NOTE — Progress Notes (Signed)
? ? ?Patient ID: Destiny Flores, female    DOB: Sep 29, 1980, 41 y.o.   MRN: [id number] ? ?PCP: Danelle Berry, PA-C ? ?Chief Complaint  ?Patient presents with  ? Hospitalization Follow-up  ?  Kidney stones pt states feels much better  ? ? ?Subjective:  ? ?Destiny Flores is a 41 y.o. female, presents to clinic with CC of the following: ? ?HPI  ?Patient presented to the ER yesterday with bilateral flank pain that was severe coming in waves, pain moved down from bilateral flanks wrapping around her low back.  The ER blood work was unremarkable, urinalysis showed hematuria with negative leukocytes nitrites and rare bacteria, there was no culture sent.  Renal stones study showed punctate bilateral renal calculi and no ureteral calculi some mild left renal pelvis fullness -noted to be nonspecific but potentially related to recently passed calculus.  Incidental finding of bilateral ovarian cysts.  Patient has no family history of ovarian, uterine, colon or breast cancer but both her mother and grandmother had large ovarian cyst leading to hysterectomy and oophorectomy.  Patient is unable to use a tampon and we have been unable to perform a Pap here in office due to vaginismus.  She has upcoming GYN appointment next month with that OB/GYN.  She has mild suprapelvic discomfort with no dysuria, hematuria, urinary frequency or urgency.  Her menstrual cycles have not changed. ? ?Patient Active Problem List  ? Diagnosis Date Noted  ? Allergic rhinitis 10/31/2020  ? 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 Thrombocytosis 05/17/2020  ? Hyperlipidemia 05/17/2020  ? Gastroesophageal reflux disease without esophagitis 05/17/2020  ? Chronic sinusitis 05/17/2020  ? Morbid obesity with BMI of 40.0-44.9, adult (HCC) 07/14/2019  ? 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 Vitamin D deficiency 03/06/2018  ? Cerebral palsy (HCC) 12/24/2017  ? Epilepsy (HCC) 12/24/2017  ? Hidradenitis suppurativa 12/24/2017  ? 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 ? ? ? ?Current Outpatient Medications:  ?  Adalimumab (HUMIRA PEN-CD/UC/HS STARTER) 80 MG/0.8ML PNKT, Inject the contents of 1 pen (80mg ) under the skin once weekly., Disp: , Rfl:  ?  ascorbic acid (VITAMIN C) 1000 MG tablet, Take by mouth., Disp: , Rfl:  ?  azelastine (ASTELIN) 0.1 % nasal spray, Place 1-2 sprays into both nostrils 2 (two) times daily. Use in each nostril as directed, Disp: 30 mL, Rfl: 12 ?  Brivaracetam (BRIVIACT) 100 MG TABS, Take 1 tablet by mouth 2 (two) times daily., Disp: , Rfl:  ?  clindamycin (CLEOCIN) 300 MG capsule, Take 300 mg by mouth 2 (two) times daily., Disp: , Rfl:  ?  famotidine (PEPCID) 40 MG tablet, Take 0.5 tablets (20 mg total) by mouth 2 (two) times daily as needed for heartburn or indigestion., Disp: 180 tablet, Rfl: 1 ?  felbamate (FELBATOL) 600 MG tablet, Take 600 mg by mouth 3 (three) times daily., Disp: , Rfl:  ?  HYDROcodone-acetaminophen (NORCO/VICODIN) 5-325 MG tablet, Take 1 tablet by mouth every 6 (six) hours as needed., Disp: , Rfl:  ?  levETIRAcetam (KEPPRA) 750 MG tablet, Take 1,500 mg by mouth 2 (two) times daily., Disp: , Rfl:  ?  magnesium oxide (MAG-OX) 400 MG tablet, Take by mouth., Disp: , Rfl:  ?  Melatonin 3 MG CAPS, Take by mouth., Disp: , Rfl:  ?  pyridOXINE (VITAMIN B-6) 100 MG tablet, Take 100 mg by mouth daily., Disp: , Rfl:  ?  spironolactone (ALDACTONE) 100 MG tablet, Take by mouth., Disp: ,  Rfl:  ?  TURMERIC CURCUMIN PO, Take 2 capsules by mouth daily., Disp: , Rfl:  ?  albuterol (VENTOLIN HFA) 108 (90 Base) MCG/ACT inhaler, Inhale 1-2 puffs into the lungs every 6 (six) hours as needed for up to 10 days for wheezing or shortness of breath., Disp: 8 g, Rfl: 0 ?  benzonatate (TESSALON) 100 MG capsule, Take 1 capsule (100 mg total) by mouth 3 (three) times daily as needed for cough. (Patient not taking: Reported on 09/25/2021), Disp: 30 capsule, Rfl: 0 ?  cefixime (SUPRAX) 400 MG CAPS capsule, Take 1 capsule  (400 mg total) by mouth daily., Disp: 7 capsule, Rfl: 0 ?  fexofenadine (ALLEGRA) 180 MG tablet, Take by mouth. (Patient not taking: Reported on 09/25/2021), Disp: , Rfl:  ?  montelukast (SINGULAIR) 10 MG tablet, Take 10 mg by mouth daily. (Patient not taking: Reported on 09/25/2021), Disp: , Rfl:  ?  pantoprazole (PROTONIX) 40 MG tablet, Take 1 tablet (40 mg total) by mouth 2 (two) times daily for 28 days, THEN 1 tablet (40 mg total) daily., Disp: 146 tablet, Rfl: 0 ?  predniSONE (STERAPRED UNI-PAK 21 TAB) 10 MG (21) TBPK tablet, Take as directed on package.  (60 mg po on day 1, 50 mg po on day 2...), Disp: 21 tablet, Rfl: 0 ?  SUMAtriptan (IMITREX) 50 MG tablet, Take 50 mg by mouth daily as needed., Disp: , Rfl:  ?  topiramate (TOPAMAX) 25 MG tablet, Take 50 mg by mouth at bedtime., Disp: , Rfl:  ? ? ?Allergies  ?Allergen Reactions  ? Codeine Anaphylaxis  ? Penicillins Anaphylaxis and Other (See Comments)  ?  Has patient had a PCN reaction causing immediate rash, facial/tongue/throat swelling, SOB or lightheadedness with hypotension: Yes ?Has patient had a PCN reaction causing severe rash involving mucus membranes or skin necrosis: No ?Has patient had a PCN reaction that required hospitalization: Yes ?Has patient had a PCN reaction occurring within the last 10 years: No ?If all of the above answers are "NO", then may proceed with Cephalosporin use. ? ?Has patient had a PCN reaction causing immediate rash, facial/tongue/throat swelling, SOB or lightheadedness with hypotension: Yes ?Has patient had a PCN reaction causing severe rash involving mucus membranes or skin necrosis: No ?Has patient had a PCN reaction that required hospitalization: Yes ?Has patient had a PCN reaction occurring within the last 10 years: No ?If all of the above answers are "NO", then may proceed with Cephalosporin use. ?Has patient had a PCN reaction causing immediate rash, facial/tongue/throat swelling, SOB or lightheadedness with hypotension:  Yes ?Has patient had a PCN reaction causing severe rash involving mucus membranes or skin necrosis: No ?Has patient had a PCN reaction that required hospitalization: Yes ?Has patient had a PCN reaction occurring within the last 10 years: No ?If all of the above answers are "NO", then may proceed with Cephalosporin use.  ? Sulfa Antibiotics Hives  ? Tape Other (See Comments)  ?  Large Boils-PAPER TAPE OK TO USE ?Large Boils-PAPER TAPE OK TO USE  ? ? ? ?Social History  ? ?Tobacco Use  ? Smoking status: Never  ? Smokeless tobacco: Never  ?Vaping Use  ? Vaping Use: Never used  ?Substance Use Topics  ? Alcohol use: Never  ? Drug use: Never  ?  ? ? ?Chart Review Today: ?I personally reviewed active problem list, medication list, allergies, family history, social history, health maintenance, notes from last encounter, lab results, imaging with the patient/caregiver today. ? ? ?Review  of Systems  ?Constitutional: Negative.   ?HENT: Negative.    ?Eyes: Negative.   ?Respiratory: Negative.    ?Cardiovascular: Negative.   ?Gastrointestinal: Negative.   ?Endocrine: Negative.   ?Genitourinary: Negative.   ?Musculoskeletal: Negative.   ?Skin: Negative.   ?Allergic/Immunologic: Negative.   ?Neurological: Negative.   ?Hematological: Negative.   ?Psychiatric/Behavioral: Negative.    ?All other systems reviewed and are negative. ? ?   ?Objective:  ? ?Vitals:  ? 10/17/21 0848  ?BP: 102/64  ?Pulse: (!) 101  ?Resp: 16  ?Temp: 97.7 ?F (36.5 ?C)  ?TempSrc: Oral  ?SpO2: 97%  ?Weight: 232 lb 14.4 oz (105.6 kg)  ?Height: 5\' 5"  (1.651 m)  ?  ?Body mass index is 38.76 kg/m?. ? ?Physical Exam ?Vitals and nursing note reviewed.  ?Constitutional:   ?   General: She is not in acute distress. ?   Appearance: She is obese. She is not ill-appearing, toxic-appearing or diaphoretic.  ?HENT:  ?   Head: Normocephalic and atraumatic.  ?   Right Ear: External ear normal.  ?   Left Ear: External ear normal.  ?Eyes:  ?   General: No scleral icterus.    ?    Right eye: No discharge.     ?   Left eye: No discharge.  ?   Conjunctiva/sclera: Conjunctivae normal.  ?Cardiovascular:  ?   Rate and Rhythm: Regular rhythm. Tachycardia present.  ?   Pulses: Normal pul

## 2021-10-17 NOTE — Patient Instructions (Signed)

## 2021-10-18 ENCOUNTER — Encounter: Payer: Self-pay | Admitting: Family Medicine

## 2021-10-19 LAB — URINE CULTURE
MICRO NUMBER:: 13280910
SPECIMEN QUALITY:: ADEQUATE

## 2021-10-20 ENCOUNTER — Encounter: Payer: Self-pay | Admitting: Family Medicine

## 2021-10-20 ENCOUNTER — Ambulatory Visit: Payer: Medicaid Other | Admitting: Family Medicine

## 2021-10-20 VITALS — BP 128/76 | HR 111 | Temp 98.2°F | Resp 16 | Ht 65.0 in

## 2021-10-20 DIAGNOSIS — R109 Unspecified abdominal pain: Secondary | ICD-10-CM | POA: Diagnosis not present

## 2021-10-20 DIAGNOSIS — R1011 Right upper quadrant pain: Secondary | ICD-10-CM | POA: Diagnosis not present

## 2021-10-20 DIAGNOSIS — Z87442 Personal history of urinary calculi: Secondary | ICD-10-CM

## 2021-10-20 MED ORDER — KETOROLAC TROMETHAMINE 60 MG/2ML IM SOLN
60.0000 mg | Freq: Once | INTRAMUSCULAR | Status: AC
Start: 2021-10-20 — End: 2021-10-20
  Administered 2021-10-20: 30 mg via INTRAMUSCULAR

## 2021-10-20 NOTE — Progress Notes (Signed)
? ? ?Patient ID: Destiny Flores, female    DOB: 1981-06-22, 41 y.o.   MRN: [id number] ? ?PCP: Danelle Berry, PA-C ? ?Chief Complaint  ?Patient presents with  ? Flank Pain  ?  Bilateral Worse since last visit on 4/18  ? ? ?Subjective:  ? ?Destiny Flores is a 41 y.o. female, presents to clinic with CC of the following: ? ?HPI  ?F/up from OV 3 d ago following ER encounter ?She has been taking flomax and she tolerated, but no improvement on pain ?Pain is severe, today much worse and coming in waves with no alleviating or aggravating factors ?She feels severe pain to right flank, more mild to left flank, and severe RUQ abd pain ?She has loss of appetite ?No N/V ?No diarrhea or constipation ?Denies fever, sweats ?Urine checked and negative ?She feels just generally worse, fatigued, dozed off in the waiting room and that's not like her ?She has no hot/cold chills.  ? ?Patient Active Problem List  ? Diagnosis Date Noted  ? History of kidney stones 10/17/2021  ? Allergic rhinitis 10/31/2020  ? 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 Thrombocytosis 05/17/2020  ? Hyperlipidemia 05/17/2020  ? Gastroesophageal reflux disease without esophagitis 05/17/2020  ? Chronic sinusitis 05/17/2020  ? Morbid obesity with BMI of 40.0-44.9, adult (HCC) 07/14/2019  ? 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 Vitamin D deficiency 03/06/2018  ? Cerebral palsy (HCC) 12/24/2017  ? Epilepsy (HCC) 12/24/2017  ? Hidradenitis suppurativa 12/24/2017  ? 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 ? ? ? ?Current Outpatient Medications:  ?  Adalimumab (HUMIRA PEN-CD/UC/HS STARTER) 80 MG/0.8ML PNKT, Inject the contents of 1 pen (80mg ) under the skin once weekly., Disp: , Rfl:  ?  ascorbic acid (VITAMIN C) 1000 MG tablet, Take by mouth., Disp: , Rfl:  ?  azelastine (ASTELIN) 0.1 % nasal spray, Place 1-2 sprays into both nostrils 2 (two) times daily. Use in each nostril as directed, Disp: 30 mL, Rfl: 12 ?  benzonatate  (TESSALON) 100 MG capsule, Take 1 capsule (100 mg total) by mouth 3 (three) times daily as needed for cough., Disp: 30 capsule, Rfl: 0 ?  Brivaracetam (BRIVIACT) 100 MG TABS, Take 1 tablet by mouth 2 (two) times daily., Disp: , Rfl:  ?  clindamycin (CLEOCIN) 300 MG capsule, Take 300 mg by mouth 2 (two) times daily., Disp: , Rfl:  ?  famotidine (PEPCID) 40 MG tablet, Take 0.5 tablets (20 mg total) by mouth 2 (two) times daily as needed for heartburn or indigestion., Disp: 180 tablet, Rfl: 1 ?  felbamate (FELBATOL) 600 MG tablet, Take 600 mg by mouth 3 (three) times daily., Disp: , Rfl:  ?  fexofenadine (ALLEGRA) 180 MG tablet, Take by mouth., Disp: , Rfl:  ?  HYDROcodone-acetaminophen (NORCO/VICODIN) 5-325 MG tablet, Take 1 tablet by mouth every 6 (six) hours as needed., Disp: , Rfl:  ?  levETIRAcetam (KEPPRA) 750 MG tablet, Take 1,500 mg by mouth 2 (two) times daily., Disp: , Rfl:  ?  magnesium oxide (MAG-OX) 400 MG tablet, Take by mouth., Disp: , Rfl:  ?  Melatonin 3 MG CAPS, Take by mouth., Disp: , Rfl:  ?  pyridOXINE (VITAMIN B-6) 100 MG tablet, Take 100 mg by mouth daily., Disp: , Rfl:  ?  spironolactone (ALDACTONE) 100 MG tablet, Take by mouth., Disp: , Rfl:  ?  tamsulosin (FLOMAX) 0.4 MG CAPS capsule, Take 1 capsule (0.4 mg total) by mouth daily., Disp: 30 capsule, Rfl:  3 ?  topiramate (TOPAMAX) 25 MG tablet, Take 50 mg by mouth at bedtime., Disp: , Rfl:  ?  TURMERIC CURCUMIN PO, Take 2 capsules by mouth daily., Disp: , Rfl:  ?  albuterol (VENTOLIN HFA) 108 (90 Base) MCG/ACT inhaler, Inhale 1-2 puffs into the lungs every 6 (six) hours as needed for up to 10 days for wheezing or shortness of breath., Disp: 8 g, Rfl: 0 ?  cefixime (SUPRAX) 400 MG CAPS capsule, Take 1 capsule (400 mg total) by mouth daily., Disp: 7 capsule, Rfl: 0 ?  pantoprazole (PROTONIX) 40 MG tablet, Take 1 tablet (40 mg total) by mouth 2 (two) times daily for 28 days, THEN 1 tablet (40 mg total) daily., Disp: 146 tablet, Rfl: 0 ?  predniSONE  (STERAPRED UNI-PAK 21 TAB) 10 MG (21) TBPK tablet, Take as directed on package.  (60 mg po on day 1, 50 mg po on day 2...), Disp: 21 tablet, Rfl: 0 ?  SUMAtriptan (IMITREX) 50 MG tablet, Take 50 mg by mouth daily as needed., Disp: , Rfl:  ? ? ?Allergies  ?Allergen Reactions  ? Codeine Anaphylaxis  ? Penicillins Anaphylaxis and Other (See Comments)  ?  Has patient had a PCN reaction causing immediate rash, facial/tongue/throat swelling, SOB or lightheadedness with hypotension: Yes ?Has patient had a PCN reaction causing severe rash involving mucus membranes or skin necrosis: No ?Has patient had a PCN reaction that required hospitalization: Yes ?Has patient had a PCN reaction occurring within the last 10 years: No ?If all of the above answers are "NO", then may proceed with Cephalosporin use. ? ?Has patient had a PCN reaction causing immediate rash, facial/tongue/throat swelling, SOB or lightheadedness with hypotension: Yes ?Has patient had a PCN reaction causing severe rash involving mucus membranes or skin necrosis: No ?Has patient had a PCN reaction that required hospitalization: Yes ?Has patient had a PCN reaction occurring within the last 10 years: No ?If all of the above answers are "NO", then may proceed with Cephalosporin use. ?Has patient had a PCN reaction causing immediate rash, facial/tongue/throat swelling, SOB or lightheadedness with hypotension: Yes ?Has patient had a PCN reaction causing severe rash involving mucus membranes or skin necrosis: No ?Has patient had a PCN reaction that required hospitalization: Yes ?Has patient had a PCN reaction occurring within the last 10 years: No ?If all of the above answers are "NO", then may proceed with Cephalosporin use.  ? Sulfa Antibiotics Hives  ? Tape Other (See Comments)  ?  Large Boils-PAPER TAPE OK TO USE ?Large Boils-PAPER TAPE OK TO USE  ? ? ? ?Social History  ? ?Tobacco Use  ? Smoking status: Never  ? Smokeless tobacco: Never  ?Vaping Use  ? Vaping Use:  Never used  ?Substance Use Topics  ? Alcohol use: Never  ? Drug use: Never  ?  ? ? ?Chart Review Today: ?I personally reviewed active problem list, medication list, allergies, family history, social history, health maintenance, notes from last encounter, lab results, imaging with the patient/caregiver today. ? ? ?Review of Systems  ?Constitutional:  Positive for activity change, appetite change and fatigue. Negative for chills, diaphoresis, fever and unexpected weight change.  ?HENT: Negative.    ?Eyes: Negative.   ?Respiratory: Negative.    ?Cardiovascular: Negative.   ?Gastrointestinal:  Positive for abdominal pain. Negative for abdominal distention, blood in stool, constipation, diarrhea, nausea, rectal pain and vomiting.  ?Endocrine: Negative.   ?Genitourinary:  Positive for flank pain. Negative for decreased urine volume, difficulty  urinating, frequency, hematuria, menstrual problem, pelvic pain, urgency, vaginal bleeding and vaginal discharge.  ?Skin: Negative.   ?Allergic/Immunologic: Negative.   ?Neurological: Negative.   ?Hematological: Negative.   ?Psychiatric/Behavioral: Negative.    ?All other systems reviewed and are negative. ? ?   ?Objective:  ? ?Vitals:  ? 10/20/21 1444  ?BP: 128/76  ?Pulse: (!) 111  ?Resp: 16  ?Temp: 98.2 ?F (36.8 ?C)  ?SpO2: 99%  ?Height: 5\' 5"  (1.651 m)  ?  ?Body mass index is 38.76 kg/m?. ? ?Physical Exam ?Vitals and nursing note reviewed.  ?Constitutional:   ?   General: She is not in acute distress. ?   Appearance: Normal appearance. She is well-developed and well-groomed. She is ill-appearing. She is not toxic-appearing or diaphoretic.  ?HENT:  ?   Head: Normocephalic and atraumatic.  ?   Right Ear: External ear normal.  ?   Left Ear: External ear normal.  ?   Nose: Nose normal.  ?Eyes:  ?   General: No scleral icterus.    ?   Right eye: No discharge.     ?   Left eye: No discharge.  ?   Conjunctiva/sclera: Conjunctivae normal.  ?Cardiovascular:  ?   Rate and Rhythm: Regular  rhythm. Tachycardia present.  ?   Pulses: Normal pulses.  ?   Heart sounds: Normal heart sounds. No murmur heard. ?  No friction rub. No gallop.  ?Pulmonary:  ?   Effort: Pulmonary effort is normal.  ?   

## 2021-10-21 ENCOUNTER — Emergency Department (HOSPITAL_BASED_OUTPATIENT_CLINIC_OR_DEPARTMENT_OTHER): Payer: Medicaid Other

## 2021-10-21 ENCOUNTER — Emergency Department (HOSPITAL_BASED_OUTPATIENT_CLINIC_OR_DEPARTMENT_OTHER)
Admission: EM | Admit: 2021-10-21 | Discharge: 2021-10-21 | Disposition: A | Discharge status: Home / Self Care | Payer: Medicaid Other | Attending: Emergency Medicine | Admitting: Emergency Medicine

## 2021-10-21 ENCOUNTER — Encounter (HOSPITAL_BASED_OUTPATIENT_CLINIC_OR_DEPARTMENT_OTHER): Payer: Self-pay | Admitting: Emergency Medicine

## 2021-10-21 ENCOUNTER — Other Ambulatory Visit: Payer: Self-pay

## 2021-10-21 DIAGNOSIS — R110 Nausea: Secondary | ICD-10-CM | POA: Insufficient documentation

## 2021-10-21 DIAGNOSIS — R1011 Right upper quadrant pain: Secondary | ICD-10-CM | POA: Insufficient documentation

## 2021-10-21 DIAGNOSIS — R109 Unspecified abdominal pain: Secondary | ICD-10-CM

## 2021-10-21 DIAGNOSIS — M545 Low back pain, unspecified: Secondary | ICD-10-CM | POA: Diagnosis not present

## 2021-10-21 DIAGNOSIS — K5730 Diverticulosis of large intestine without perforation or abscess without bleeding: Secondary | ICD-10-CM | POA: Diagnosis not present

## 2021-10-21 DIAGNOSIS — R1012 Left upper quadrant pain: Secondary | ICD-10-CM | POA: Insufficient documentation

## 2021-10-21 DIAGNOSIS — N200 Calculus of kidney: Secondary | ICD-10-CM | POA: Diagnosis not present

## 2021-10-21 LAB — URINALYSIS, ROUTINE W REFLEX MICROSCOPIC
Bilirubin Urine: NEGATIVE
Glucose, UA: NEGATIVE mg/dL
Ketones, ur: NEGATIVE mg/dL
Nitrite: NEGATIVE
Protein, ur: NEGATIVE mg/dL
Specific Gravity, Urine: 1.014 (ref 1.005–1.030)
pH: 7 (ref 5.0–8.0)

## 2021-10-21 LAB — CBC WITH DIFFERENTIAL/PLATELET
Abs Immature Granulocytes: 0.03 K/uL (ref 0.00–0.07)
Basophils Absolute: 0.1 K/uL (ref 0.0–0.1)
Basophils Relative: 1 %
Eosinophils Absolute: 0.4 K/uL (ref 0.0–0.5)
Eosinophils Relative: 4 %
HCT: 36.8 % (ref 36.0–46.0)
Hemoglobin: 12.1 g/dL (ref 12.0–15.0)
Immature Granulocytes: 0 %
Lymphocytes Relative: 31 %
Lymphs Abs: 3.2 K/uL (ref 0.7–4.0)
MCH: 29.4 pg (ref 26.0–34.0)
MCHC: 32.9 g/dL (ref 30.0–36.0)
MCV: 89.3 fL (ref 80.0–100.0)
Monocytes Absolute: 0.5 K/uL (ref 0.1–1.0)
Monocytes Relative: 5 %
Neutro Abs: 5.9 K/uL (ref 1.7–7.7)
Neutrophils Relative %: 59 %
Platelets: 475 K/uL — ABNORMAL HIGH (ref 150–400)
RBC: 4.12 MIL/uL (ref 3.87–5.11)
RDW: 13.3 % (ref 11.5–15.5)
WBC: 10.1 K/uL (ref 4.0–10.5)
nRBC: 0 % (ref 0.0–0.2)

## 2021-10-21 LAB — COMPREHENSIVE METABOLIC PANEL WITH GFR
ALT: 16 U/L (ref 0–44)
AST: 15 U/L (ref 15–41)
Albumin: 3.9 g/dL (ref 3.5–5.0)
Alkaline Phosphatase: 65 U/L (ref 38–126)
Anion gap: 10 (ref 5–15)
BUN: 11 mg/dL (ref 6–20)
CO2: 24 mmol/L (ref 22–32)
Calcium: 9.4 mg/dL (ref 8.9–10.3)
Chloride: 103 mmol/L (ref 98–111)
Creatinine, Ser: 0.67 mg/dL (ref 0.44–1.00)
GFR, Estimated: >60 mL/min
Glucose, Bld: 82 mg/dL (ref 70–99)
Potassium: 3.6 mmol/L (ref 3.5–5.1)
Sodium: 137 mmol/L (ref 135–145)
Total Bilirubin: 0.3 mg/dL (ref 0.3–1.2)
Total Protein: 7.1 g/dL (ref 6.5–8.1)

## 2021-10-21 LAB — LIPASE, BLOOD: Lipase: 19 U/L (ref 11–51)

## 2021-10-21 LAB — LACTIC ACID, PLASMA: Lactic Acid, Venous: 1.1 mmol/L (ref 0.5–1.9)

## 2021-10-21 LAB — PREGNANCY, URINE: Preg Test, Ur: NEGATIVE

## 2021-10-21 MED ORDER — METHOCARBAMOL 500 MG PO TABS: 500.0000 mg | ORAL_TABLET | Freq: Four times a day (QID) | ORAL | 0 refills | Status: DC | PRN

## 2021-10-21 MED ORDER — HYDROMORPHONE HCL 1 MG/ML IJ SOLN
1.0000 mg | Freq: Once | INTRAMUSCULAR | Status: AC
  Administered 2021-10-21: 1 mg via INTRAVENOUS
  Filled 2021-10-21: qty 1

## 2021-10-21 MED ORDER — CIPROFLOXACIN IN D5W 400 MG/200ML IV SOLN
400.0000 mg | Freq: Once | INTRAVENOUS | Status: AC
  Administered 2021-10-21: 400 mg via INTRAVENOUS
  Filled 2021-10-21: qty 200

## 2021-10-21 MED ORDER — SODIUM CHLORIDE 0.9 % IV BOLUS
1000.0000 mL | Freq: Once | INTRAVENOUS | Status: AC
  Administered 2021-10-21: 1000 mL via INTRAVENOUS

## 2021-10-21 NOTE — ED Triage Notes (Signed)
Pt h/o kidney stones and c/o bilateral lower back pain since Sunday - states it feels similar to past episodes. Seen on Monday for same and since Monday pain has gotten worse and starting on Thursday began to have abdominal pain as well. ?

## 2021-10-21 NOTE — ED Notes (Signed)
Patient transported to CT 

## 2021-10-21 NOTE — ED Provider Notes (Signed)
?[facility]PT ?Provider Note ? ? ?CSN: [id number] ?Arrival date & time: 10/21/21  1639 ? ?  ? ?History ? ?Chief Complaint  ?Patient presents with  ? Flank Pain  ? ? ?Destiny Flores is a 41 y.o. female. ? ?HPI ?Patient reports she started getting flank pain last week.  At first it was left-sided lower back pain.  She had a strong history of kidney stones.  Patient was seen by her doctor and a CT scan was done.  She did not have any stones in the ureters.  Patient reports that she was treated with Toradol and had some temporary improvement.  Over the past couple of days now however she started to get pain in her lower back on both sides.  She reports it is kind of in the flank area and then she also now feels that in the front of her abdomen.  She reports is very painful and achy.  She has had some nausea but no vomiting.  She denies pain or burning with urination. ?  ? ?Home Medications ?Prior to Admission medications   ?Medication Sig Start Date End Date Taking? Authorizing Provider  ?ciprofloxacin (CIPRO) 250 MG tablet Take 1 tablet (250 mg total) by mouth 2 (two) times daily for 7 days. 10/23/21 10/30/21  Alba Cory, MD  ?Adalimumab (HUMIRA PEN-CD/UC/HS STARTER) 80 MG/0.8ML PNKT Inject the contents of 1 pen (80mg ) under the skin once weekly. 04/01/20   [provider]  ?albuterol (VENTOLIN HFA) 108 (90 Base) MCG/ACT inhaler Inhale 1-2 puffs into the lungs every 6 (six) hours as needed for up to 10 days for wheezing or shortness of breath. 02/26/21 03/08/21  Leath-Warren, 05/08/21, NP  ?ascorbic acid (VITAMIN C) 1000 MG tablet Take by mouth.    [provider]  ?azelastine (ASTELIN) 0.1 % nasal spray Place 1-2 sprays into both nostrils 2 (two) times daily. Use in each nostril as directed 04/27/21   04/29/21, PA-C  ?benzonatate (TESSALON) 100 MG capsule Take 1 capsule (100 mg total) by mouth 3 (three) times daily as needed for cough. 04/27/21   04/29/21, PA-C   ?Brivaracetam (BRIVIACT) 100 MG TABS Take 1 tablet by mouth 2 (two) times daily. 11/21/20   [provider]  ?famotidine (PEPCID) 40 MG tablet Take 0.5 tablets (20 mg total) by mouth 2 (two) times daily as needed for heartburn or indigestion. 03/21/21   03/23/21, PA-C  ?felbamate (FELBATOL) 600 MG tablet Take 600 mg by mouth 3 (three) times daily.    [provider]  ?fexofenadine (ALLEGRA) 180 MG tablet Take by mouth. 10/31/20 10/31/21  [provider]  ?HYDROcodone-acetaminophen (NORCO/VICODIN) 5-325 MG tablet Take 1 tablet by mouth every 6 (six) hours as needed. 08/09/20   [provider]  ?levETIRAcetam (KEPPRA) 750 MG tablet Take 1,500 mg by mouth 2 (two) times daily. 05/07/21   [provider]  ?magnesium oxide (MAG-OX) 400 MG tablet Take by mouth.    [provider]  ?Melatonin 3 MG CAPS Take by mouth.    [provider]  ?pantoprazole (PROTONIX) 40 MG tablet Take 1 tablet (40 mg total) by mouth 2 (two) times daily for 28 days, THEN 1 tablet (40 mg total) daily. 03/21/21 07/17/21  07/19/21, PA-C  ?predniSONE (STERAPRED UNI-PAK 21 TAB) 10 MG (21) TBPK tablet Take as directed on package.  (60 mg po on day 1, 50 mg po on day 2...) 04/27/21   04/29/21, PA-C  ?pyridOXINE (VITAMIN  B-6) 100 MG tablet Take 100 mg by mouth daily.    [provider]  ?spironolactone (ALDACTONE) 100 MG tablet Take by mouth. 12/10/19   [provider]  ?SUMAtriptan (IMITREX) 50 MG tablet Take 50 mg by mouth daily as needed. 07/25/20 07/25/21  [provider]  ?tamsulosin (FLOMAX) 0.4 MG CAPS capsule Take 1 capsule (0.4 mg total) by mouth daily. 10/17/21   Danelle Berryapia, Leisa, PA-C  ?topiramate (TOPAMAX) 25 MG tablet Take 50 mg by mouth at bedtime. 09/25/21   [provider]  ?TURMERIC CURCUMIN PO Take 2 capsules by mouth daily.    [provider]  ?   ? ?Allergies    ?Codeine, Penicillins, Sulfa antibiotics, and Tape   ? ?Review of Systems    ?Review of Systems ?10 systems reviewed negative except as per HPI ?Physical Exam ?Updated Vital Signs ?BP (!) 137/92 (BP Location: Right Arm)   Pulse 75   Temp 97.8 ?F (36.6 ?C)   Resp 16   LMP 10/17/2021 (Exact Date)   SpO2 100%  ?Physical Exam ?Constitutional:   ?   Comments: Alert nontoxic no respiratory distress  ?HENT:  ?   Mouth/Throat:  ?   Pharynx: Oropharynx is clear.  ?Eyes:  ?   Extraocular Movements: Extraocular movements intact.  ?Cardiovascular:  ?   Rate and Rhythm: Normal rate and regular rhythm.  ?Pulmonary:  ?   Effort: Pulmonary effort is normal.  ?   Breath sounds: Normal breath sounds.  ?Abdominal:  ?   Comments: Abdomen soft.  Moderate to significant pain in the right upper quadrant as well as the left upper quadrant symmetrically.  Also pain to palpation suprapubic area.  No significant CVA tenderness.  Mildly uncomfortable.  ?Musculoskeletal:     ?   General: No swelling or tenderness. Normal range of motion.  ?   Right lower leg: No edema.  ?   Left lower leg: No edema.  ?Skin: ?   General: Skin is warm and dry.  ?Neurological:  ?   General: No focal deficit present.  ?   Mental Status: She is oriented to person, place, and time.  ?   Coordination: Coordination normal.  ?Psychiatric:     ?   Mood and Affect: Mood normal.  ? ? ?ED Results / Procedures / Treatments   ?Labs ?(all labs ordered are listed, but only abnormal results are displayed) ?Labs Reviewed  ?URINE CULTURE - Abnormal; Notable for the following components:  ?    Result Value  ? Culture MULTIPLE SPECIES PRESENT, SUGGEST RECOLLECTION (*)   ? All other components within normal limits  ?URINALYSIS, ROUTINE W REFLEX MICROSCOPIC - Abnormal; Notable for the following components:  ? Hgb urine dipstick LARGE (*)   ? Leukocytes,Ua MODERATE (*)   ? Bacteria, UA FEW (*)   ? All other components within normal limits  ?CBC WITH DIFFERENTIAL/PLATELET - Abnormal; Notable for the following components:  ? Platelets 475 (*)   ? All other  components within normal limits  ?PREGNANCY, URINE  ?COMPREHENSIVE METABOLIC PANEL  ?LACTIC ACID, PLASMA  ?LIPASE, BLOOD  ? ? ?EKG ?None ? ?Radiology ?CT Renal Stone Study ? ?Result Date: 10/21/2021 ?CLINICAL DATA:  Flank pain EXAM: CT ABDOMEN AND PELVIS WITHOUT CONTRAST TECHNIQUE: Multidetector CT imaging of the abdomen and pelvis was performed following the standard protocol without IV contrast. RADIATION DOSE REDUCTION: This exam was performed according to the departmental dose-optimization program which includes automated exposure control, adjustment of the mA and/or  kV according to patient size and/or use of iterative reconstruction technique. COMPARISON:  CT examination dated October 16, 2021 FINDINGS: Lower chest: No acute abnormality. Hepatobiliary: No focal liver abnormality is seen. Status post cholecystectomy. No biliary dilatation. Pancreas: Unremarkable. No pancreatic ductal dilatation or surrounding inflammatory changes. Spleen: Normal in size without focal abnormality. Adrenals/Urinary Tract: Adrenal glands are unremarkable. Punctate nonobstructing calculus in the interpolar region of the left kidney. No hydronephrosis or appreciable ureteral calculus. No perinephric fat stranding. Bladder is unremarkable. Stomach/Bowel: Stomach is within normal limits. Appendix not identified. No evidence of bowel wall thickening, distention, or inflammatory changes. Scattered colonic diverticuli without evidence of acute diverticulitis. Vascular/Lymphatic: No significant vascular findings are present. No enlarged abdominal or pelvic lymph nodes. Reproductive: Uterus is normal in size. Enlarged bilateral ovarian cysts, the right measuring 3.7 x 3.0 cm and the left measuring 5.2 x 3.3 cm. Other: No abdominal wall hernia or abnormality. No abdominopelvic ascites. Musculoskeletal: No acute or significant osseous findings. IMPRESSION: 1. Punctate nonobstructing calculus in the interpolar region of the left kidney. No  evidence of hydronephrosis or ureteral calculus. 2. Scattered colonic diverticulosis without evidence of acute diverticulitis. 3. Bilateral ovarian cysts, further evaluation with pelvic sonogram would be helpful.

## 2021-10-22 ENCOUNTER — Encounter: Payer: Self-pay | Admitting: Family Medicine

## 2021-10-23 ENCOUNTER — Other Ambulatory Visit: Payer: Self-pay | Admitting: Family Medicine

## 2021-10-23 ENCOUNTER — Other Ambulatory Visit: Payer: Self-pay

## 2021-10-23 DIAGNOSIS — N83201 Unspecified ovarian cyst, right side: Secondary | ICD-10-CM

## 2021-10-23 DIAGNOSIS — R109 Unspecified abdominal pain: Secondary | ICD-10-CM

## 2021-10-23 LAB — URINE CULTURE

## 2021-10-23 MED ORDER — CIPROFLOXACIN HCL 250 MG PO TABS: 250.0000 mg | ORAL_TABLET | Freq: Two times a day (BID) | ORAL | 0 refills | Status: AC

## 2021-10-25 ENCOUNTER — Encounter: Payer: Self-pay | Admitting: *Deleted

## 2021-10-25 LAB — CULTURE, URINE COMPREHENSIVE
MICRO NUMBER:: 13304019
SPECIMEN QUALITY:: ADEQUATE

## 2021-10-27 ENCOUNTER — Ambulatory Visit: Payer: Self-pay | Admitting: *Deleted

## 2021-10-27 ENCOUNTER — Encounter: Payer: Medicaid Other | Admitting: Obstetrics and Gynecology

## 2021-10-27 NOTE — Telephone Encounter (Signed)
Reason for Disposition ? [1] Follow-up call to recent contact AND [2] information only call, no triage required ?   Pt returned call from Dr. Carlynn Purl ? ?Answer Assessment - Initial Assessment Questions ?1. REASON FOR CALL or QUESTION: "What is your reason for calling today?" or "How can I best help you?" or "What question do you have that I can help answer?" ? ? ?    Pt received a call from Dr. Carlynn Purl regarding how she is feeling.   She states she is tired by the end of the day but the side pain is almost completely gone.   Currently still taking Cipro but she feels like the medication is helping. ? ?I sent this information to [facility] for Dr. Carlynn Purl. ? ?Protocols used: Information Only Call - No Triage-A-AH ? ?

## 2021-10-30 NOTE — Telephone Encounter (Signed)
Lvm to return call to schedule an appointment with Sheliah Mends

## 2021-10-31 ENCOUNTER — Ambulatory Visit: Payer: Medicaid Other | Admitting: Family Medicine

## 2021-10-31 ENCOUNTER — Encounter: Payer: Self-pay | Admitting: Family Medicine

## 2021-10-31 VITALS — BP 118/70 | HR 89 | Resp 16 | Ht 65.0 in | Wt 234.0 lb

## 2021-10-31 DIAGNOSIS — Z87442 Personal history of urinary calculi: Secondary | ICD-10-CM

## 2021-10-31 DIAGNOSIS — R319 Hematuria, unspecified: Secondary | ICD-10-CM | POA: Diagnosis not present

## 2021-10-31 DIAGNOSIS — N390 Urinary tract infection, site not specified: Secondary | ICD-10-CM | POA: Diagnosis not present

## 2021-10-31 NOTE — Progress Notes (Signed)
? ? ?Patient ID: Destiny Flores, female    DOB: 10/08/1980, 41 y.o.   MRN: [id number] ? ?PCP: Danelle Berryapia, Markeia Harkless, PA-C ? ?Chief Complaint  ?Patient presents with  ? Follow-up  ? ? ?Subjective:  ? ?Destiny FurnishRebecca Kitson is a 41 y.o. female, presents to clinic with CC of the following: ? ?HPI  ?Back pain much better  ?7 d cipro ?Feeling much better ?Stopped flomax ?Has urology f/up and gyn this month ? ? ? ?Patient Active Problem List  ? Diagnosis Date Noted  ? History of kidney stones 10/17/2021  ? Allergic rhinitis 10/31/2020  ? 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 Thrombocytosis 05/17/2020  ? Hyperlipidemia 05/17/2020  ? Gastroesophageal reflux disease without esophagitis 05/17/2020  ? Chronic sinusitis 05/17/2020  ? Morbid obesity with BMI of 40.0-44.9, adult (HCC) 07/14/2019  ? 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 Vitamin D deficiency 03/06/2018  ? Cerebral palsy (HCC) 12/24/2017  ? Epilepsy (HCC) 12/24/2017  ? Hidradenitis suppurativa 12/24/2017  ? 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 ? ? ? ?Current Outpatient Medications:  ?  Adalimumab (HUMIRA PEN-CD/UC/HS STARTER) 80 MG/0.8ML PNKT, Inject the contents of 1 pen (80mg ) under the skin once weekly., Disp: , Rfl:  ?  ascorbic acid (VITAMIN C) 1000 MG tablet, Take by mouth., Disp: , Rfl:  ?  azelastine (ASTELIN) 0.1 % nasal spray, Place 1-2 sprays into both nostrils 2 (two) times daily. Use in each nostril as directed, Disp: 30 mL, Rfl: 12 ?  benzonatate (TESSALON) 100 MG capsule, Take 1 capsule (100 mg total) by mouth 3 (three) times daily as needed for cough., Disp: 30 capsule, Rfl: 0 ?  Brivaracetam (BRIVIACT) 100 MG TABS, Take 1 tablet by mouth 2 (two) times daily., Disp: , Rfl:  ?  famotidine (PEPCID) 40 MG tablet, Take 0.5 tablets (20 mg total) by mouth 2 (two) times daily as needed for heartburn or indigestion., Disp: 180 tablet, Rfl: 1 ?  felbamate (FELBATOL) 600 MG tablet, Take 600 mg by mouth 3 (three) times daily.,  Disp: , Rfl:  ?  fexofenadine (ALLEGRA) 180 MG tablet, Take by mouth., Disp: , Rfl:  ?  HYDROcodone-acetaminophen (NORCO/VICODIN) 5-325 MG tablet, Take 1 tablet by mouth every 6 (six) hours as needed., Disp: , Rfl:  ?  levETIRAcetam (KEPPRA) 750 MG tablet, Take 1,500 mg by mouth 2 (two) times daily., Disp: , Rfl:  ?  magnesium oxide (MAG-OX) 400 MG tablet, Take by mouth., Disp: , Rfl:  ?  Melatonin 3 MG CAPS, Take by mouth., Disp: , Rfl:  ?  pyridOXINE (VITAMIN B-6) 100 MG tablet, Take 100 mg by mouth daily., Disp: , Rfl:  ?  spironolactone (ALDACTONE) 100 MG tablet, Take by mouth., Disp: , Rfl:  ?  tamsulosin (FLOMAX) 0.4 MG CAPS capsule, Take 1 capsule (0.4 mg total) by mouth daily., Disp: 30 capsule, Rfl: 3 ?  topiramate (TOPAMAX) 25 MG tablet, Take 50 mg by mouth at bedtime., Disp: , Rfl:  ?  TURMERIC CURCUMIN PO, Take 2 capsules by mouth daily., Disp: , Rfl:  ?  pantoprazole (PROTONIX) 40 MG tablet, Take 1 tablet (40 mg total) by mouth 2 (two) times daily for 28 days, THEN 1 tablet (40 mg total) daily., Disp: 146 tablet, Rfl: 0 ?  SUMAtriptan (IMITREX) 50 MG tablet, Take 50 mg by mouth daily as needed., Disp: , Rfl:  ? ? ?Allergies  ?Allergen Reactions  ? Codeine Anaphylaxis  ? Penicillins Anaphylaxis and Other (See Comments)  ?  Has patient had a PCN reaction causing immediate rash, facial/tongue/throat swelling, SOB or lightheadedness with hypotension: Yes ?Has patient had a PCN reaction causing severe rash involving mucus membranes or skin necrosis: No ?Has patient had a PCN reaction that required hospitalization: Yes ?Has patient had a PCN reaction occurring within the last 10 years: No ?If all of the above answers are "NO", then may proceed with Cephalosporin use. ? ?Has patient had a PCN reaction causing immediate rash, facial/tongue/throat swelling, SOB or lightheadedness with hypotension: Yes ?Has patient had a PCN reaction causing severe rash involving mucus membranes or skin necrosis: No ?Has patient  had a PCN reaction that required hospitalization: Yes ?Has patient had a PCN reaction occurring within the last 10 years: No ?If all of the above answers are "NO", then may proceed with Cephalosporin use. ?Has patient had a PCN reaction causing immediate rash, facial/tongue/throat swelling, SOB or lightheadedness with hypotension: Yes ?Has patient had a PCN reaction causing severe rash involving mucus membranes or skin necrosis: No ?Has patient had a PCN reaction that required hospitalization: Yes ?Has patient had a PCN reaction occurring within the last 10 years: No ?If all of the above answers are "NO", then may proceed with Cephalosporin use.  ? Sulfa Antibiotics Hives  ? Tape Other (See Comments)  ?  Large Boils-PAPER TAPE OK TO USE ?Large Boils-PAPER TAPE OK TO USE  ? ? ? ?Social History  ? ?Tobacco Use  ? Smoking status: Never  ? Smokeless tobacco: Never  ?Vaping Use  ? Vaping Use: Never used  ?Substance Use Topics  ? Alcohol use: Never  ? Drug use: Never  ?  ? ? ?Chart Review Today: ?I personally reviewed active problem list, medication list, allergies, family history, social history, health maintenance, notes from last encounter, lab results, imaging with the patient/caregiver today. ? ? ?Review of Systems  ?Constitutional: Negative.   ?HENT: Negative.    ?Eyes: Negative.   ?Respiratory: Negative.    ?Cardiovascular: Negative.   ?Gastrointestinal: Negative.   ?Endocrine: Negative.   ?Genitourinary: Negative.   ?Musculoskeletal: Negative.   ?Skin: Negative.   ?Allergic/Immunologic: Negative.   ?Neurological: Negative.   ?Hematological: Negative.   ?Psychiatric/Behavioral: Negative.    ?All other systems reviewed and are negative. ? ?   ?Objective:  ? ?Vitals:  ? 10/31/21 7353  ?BP: 118/70  ?Pulse: 89  ?Resp: 16  ?SpO2: 99%  ?Weight: 234 lb (106.1 kg)  ?Height: 5\' 5"  (1.651 m)  ?  ?Body mass index is 38.94 kg/m?. ? ?Physical Exam ?Vitals and nursing note reviewed.  ?Constitutional:   ?   General: She is not in  acute distress. ?   Appearance: Normal appearance. She is well-developed and well-groomed. She is obese. She is not ill-appearing, toxic-appearing or diaphoretic.  ?HENT:  ?   Head: Normocephalic and atraumatic.  ?   Right Ear: External ear normal.  ?   Left Ear: External ear normal.  ?   Nose: Nose normal.  ?Eyes:  ?   General: No scleral icterus.    ?   Right eye: No discharge.     ?   Left eye: No discharge.  ?   Conjunctiva/sclera: Conjunctivae normal.  ?Cardiovascular:  ?   Rate and Rhythm: Normal rate and regular rhythm.  ?   Pulses: Normal pulses.  ?   Heart sounds: Normal heart sounds. No murmur heard. ?  No friction rub. No gallop.  ?Pulmonary:  ?   Effort: Pulmonary effort is normal.  No respiratory distress.  ?   Breath sounds: Normal breath sounds. No wheezing or rales.  ?Abdominal:  ?   General: Bowel sounds are normal. There is no distension.  ?   Palpations: Abdomen is soft. There is no mass or pulsatile mass.  ?   Tenderness: There is no abdominal tenderness. There is no right CVA tenderness, left CVA tenderness, guarding or rebound. Negative signs include Rovsing's sign.  ?   Hernia: No hernia is present.  ?   Comments: Soft obese abd  ?Musculoskeletal:  ?   Cervical back: Normal.  ?   Thoracic back: No tenderness or bony tenderness.  ?   Lumbar back: No tenderness or bony tenderness.  ?Skin: ?   General: Skin is warm and dry.  ?   Coloration: Skin is not jaundiced or pale.  ?Neurological:  ?   Mental Status: She is alert. Mental status is at baseline.  ?   Gait: Gait normal.  ?Psychiatric:     ?   Mood and Affect: Mood normal.     ?   Behavior: Behavior normal. Behavior is cooperative.  ?  ? ?Results for orders placed or performed in visit on 10/23/21  ?CULTURE, URINE COMPREHENSIVE  ? Specimen: Urine  ?Result Value Ref Range  ? MICRO NUMBER: [id number]   ? SPECIMEN QUALITY: Adequate   ? Source OTHER (SPECIFY)   ? STATUS: FINAL   ? ISOLATE 1: Gram positive cocci isolated (A)   ? ? ?   ?Assessment &  Plan:  ? ?1. Urinary tract infection with hematuria, site unspecified ?Patient was seen last week and treated for UTI, she took Cipro x5 to 7 days and has started to feel better, her flank pain and episodic

## 2021-11-02 LAB — URINE CULTURE
MICRO NUMBER:: 13341606
SPECIMEN QUALITY:: ADEQUATE

## 2021-11-02 LAB — URINALYSIS, ROUTINE W REFLEX MICROSCOPIC
Bacteria, UA: NONE SEEN /HPF
Bilirubin Urine: NEGATIVE
Glucose, UA: NEGATIVE
Hyaline Cast: NONE SEEN /LPF
Ketones, ur: NEGATIVE
Leukocytes,Ua: NEGATIVE
Nitrite: NEGATIVE
Protein, ur: NEGATIVE
Specific Gravity, Urine: 1.023 (ref 1.001–1.035)
WBC, UA: NONE SEEN /HPF (ref 0–5)
pH: 6 (ref 5.0–8.0)

## 2021-11-02 LAB — MICROSCOPIC MESSAGE

## 2021-11-06 ENCOUNTER — Ambulatory Visit: Payer: Medicaid Other

## 2021-11-10 ENCOUNTER — Encounter: Payer: Self-pay | Admitting: Urology

## 2021-11-10 ENCOUNTER — Ambulatory Visit: Payer: Medicaid Other | Admitting: Urology

## 2021-11-10 VITALS — BP 130/72 | HR 80 | Ht 65.0 in | Wt 229.0 lb

## 2021-11-10 DIAGNOSIS — N200 Calculus of kidney: Secondary | ICD-10-CM

## 2021-11-10 DIAGNOSIS — R8271 Bacteriuria: Secondary | ICD-10-CM | POA: Diagnosis not present

## 2021-11-10 LAB — URINALYSIS, COMPLETE
Bilirubin, UA: NEGATIVE
Glucose, UA: NEGATIVE
Ketones, UA: NEGATIVE
Nitrite, UA: NEGATIVE
Protein,UA: NEGATIVE
Specific Gravity, UA: 1.02 (ref 1.005–1.030)
Urobilinogen, Ur: 0.2 mg/dL (ref 0.2–1.0)
pH, UA: 7.5 (ref 5.0–7.5)

## 2021-11-10 LAB — MICROSCOPIC EXAMINATION

## 2021-11-10 NOTE — Progress Notes (Signed)
? ?11/10/2021 ?8:55 AM  ? ?Destiny Flores ?Oct 24, 1980 ?[id number] ? ?Referring provider: Danelle Berry, PA-C ?[address] ?[address] ?[place],  [place] [address] ? ?Chief Complaint  ?Patient presents with  ? Nephrolithiasis  ? ? ?HPI: ?Destiny Flores is a 41 y.o. female referred for evaluation of nephrolithiasis ? ?Destiny Flores [facility] ED visit 10/16/2021 with a 24-hour history of left flank pain which was severe and rated 8/10 ?Stone protocol CT showed mild fullness of the renal pelvis but no ureteral calculi.  Punctate nonobstructing bilateral renal calculi were noted ?She received parenteral analgesics and was discharged ?Follow-up ED visit 4/22 complaining of persistent flank pain ?UA with 21-50 RBC/11-20 WBC ?CT repeated which showed no obstructing ureteral calculi and she was discharged on Robaxin ?Flank pain resolved and she has no complaints today ?Prior history of recurrent stones, the last in 2018 before this more recent episode ?No bothersome LUTS ? ? ?PMH: ?Past Medical History:  ?Diagnosis Date  ? Cerebral palsy (HCC) 12/24/2017  ? Complication of anesthesia   ? SEVERE MIGRAINES AFTER ANESTHESIA  ? Epilepsy (HCC) 12/24/2017  ? stress, heat and random things bring on seizures  ? Epilepsy associated with specific stimuli (HCC) 12/24/2017  ? 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 GERD (gastroesophageal reflux disease)   ? Headache   ? Hidradenitis suppurativa 12/24/2017  ? History of kidney stones   ? H/O  ? History of methicillin resistant staphylococcus aureus (MRSA) 2008  ? positive from axilla, left  ? Hx of Kawasaki's disease   ? Hydradenitis   ? Seizures (HCC)   ? LAST SEIZURE 2012  ? 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 battery change in 2019, inserted in 2002  ? ? ?Surgical History: ?Past Surgical History:  ?Procedure Laterality Date  ? abscess surgery Bilateral 2006  ? armpits and groin.12 surgeries overall.   ? AXILLARY HIDRADENITIS EXCISION    ?  MULTIPLE [place]  ? hidradenitis groin Left 04/2017  ? last surgery done on groin and the symptoms have returned  ? HYDRADENITIS EXCISION N/A 01/16/2018  ? Procedure: EXCISION HIDRADENITIS GROIN;  Surgeon: Destiny Ro, MD;  Location: [facility] ORS;  Service: General;  Laterality: N/A;  ? KNEE SURGERY Left 2010  ? tendon and meniscus repair  ? NOSE SURGERY  1998  ? deviated septum and sinus repair  ? ROBOTIC ASSISTED LAPAROSCOPIC CHOLECYSTECTOMY N/A 07/24/2018  ? Procedure: ROBOTIC ASSISTED LAPAROSCOPIC CHOLECYSTECTOMY;  Surgeon: Destiny Ro, MD;  Location: [facility] ORS;  Service: General;  Laterality: N/A;  ? TONSILLECTOMY    ? TYMPANOSTOMY TUBE PLACEMENT    ? as a kid  ? VAGUS NERVE STIMULATOR GENERATOR CHANGE  03/2015  ? ? ?Home Medications:  ?Allergies as of 11/10/2021   ? ?   Reactions  ? Codeine Anaphylaxis  ? Penicillins Anaphylaxis, Other (See Comments)  ? Has patient had a PCN reaction causing immediate rash, facial/tongue/throat swelling, SOB or lightheadedness with hypotension: Yes ?Has patient had a PCN reaction causing severe rash involving mucus membranes or skin necrosis: No ?Has patient had a PCN reaction that required hospitalization: Yes ?Has patient had a PCN reaction occurring within the last 10 years: No ?If all of the above answers are "NO", then may proceed with Cephalosporin use. ?Has patient had a PCN reaction causing immediate rash, facial/tongue/throat swelling, SOB or lightheadedness with hypotension: Yes ?Has patient had a PCN reaction causing severe rash involving mucus membranes or skin necrosis: No ?Has  patient had a PCN reaction that required hospitalization: Yes ?Has patient had a PCN reaction occurring within the last 10 years: No ?If all of the above answers are "NO", then may proceed with Cephalosporin use. ?Has patient had a PCN reaction causing immediate rash, facial/tongue/throat swelling, SOB or lightheadedness with hypotension: Yes ?Has patient had a  PCN reaction causing severe rash involving mucus membranes or skin necrosis: No ?Has patient had a PCN reaction that required hospitalization: Yes ?Has patient had a PCN reaction occurring within the last 10 years: No ?If all of the above answers are "NO", then may proceed with Cephalosporin use.  ? Sulfa Antibiotics Hives  ? Tape Other (See Comments)  ? Large Boils-PAPER TAPE OK TO USE ?Large Boils-PAPER TAPE OK TO USE  ? ?  ? ?  ?Medication List  ?  ? ?  ? Accurate as of Nov 10, 2021  8:55 AM. If you have any questions, ask your nurse or doctor.  ?  ?  ? ?  ? ?ascorbic acid 1000 MG tablet ?Commonly known as: VITAMIN C ?Take by mouth. ?  ?azelastine 0.1 % nasal spray ?Commonly known as: ASTELIN ?Place 1-2 sprays into both nostrils 2 (two) times daily. Use in each nostril as directed ?  ?benzonatate 100 MG capsule ?Commonly known as: TESSALON ?Take 1 capsule (100 mg total) by mouth 3 (three) times daily as needed for cough. ?  ?Briviact 100 MG Tabs tablet ?Generic drug: brivaracetam ?Take 1 tablet by mouth 2 (two) times daily. ?  ?famotidine 40 MG tablet ?Commonly known as: PEPCID ?Take 0.5 tablets (20 mg total) by mouth 2 (two) times daily as needed for heartburn or indigestion. ?  ?felbamate 600 MG tablet ?Commonly known as: FELBATOL ?Take 600 mg by mouth 3 (three) times daily. ?  ?Humira Pen-CD/UC/HS Starter 80 MG/0.8ML Pnkt ?Generic drug: Adalimumab ?Inject the contents of 1 pen (80mg ) under the skin once weekly. ?  ?HYDROcodone-acetaminophen 5-325 MG tablet ?Commonly known as: NORCO/VICODIN ?Take 1 tablet by mouth every 6 (six) hours as needed. ?  ?levETIRAcetam 750 MG tablet ?Commonly known as: KEPPRA ?Take 1,500 mg by mouth 2 (two) times daily. ?  ?magnesium oxide 400 MG tablet ?Commonly known as: MAG-OX ?Take by mouth. ?  ?Melatonin 3 MG Caps ?Take by mouth. ?  ?pantoprazole 40 MG tablet ?Commonly known as: PROTONIX ?Take 1 tablet (40 mg total) by mouth 2 (two) times daily for 28 days, THEN 1 tablet (40 mg  total) daily. ?Start taking on: March 21, 2021 ?  ?pyridOXINE 100 MG tablet ?Commonly known as: VITAMIN B-6 ?Take 100 mg by mouth daily. ?  ?spironolactone 100 MG tablet ?Commonly known as: ALDACTONE ?Take by mouth. ?  ?SUMAtriptan 50 MG tablet ?Commonly known as: IMITREX ?Take 50 mg by mouth daily as needed. ?  ?tamsulosin 0.4 MG Caps capsule ?Commonly known as: FLOMAX ?Take 1 capsule (0.4 mg total) by mouth daily. ?  ?topiramate 25 MG tablet ?Commonly known as: TOPAMAX ?Take 50 mg by mouth at bedtime. ?  ?TURMERIC CURCUMIN PO ?Take 2 capsules by mouth daily. ?  ? ?  ? ? ?Allergies:  ?Allergies  ?Allergen Reactions  ? Codeine Anaphylaxis  ? Penicillins Anaphylaxis and Other (See Comments)  ?  Has patient had a PCN reaction causing immediate rash, facial/tongue/throat swelling, SOB or lightheadedness with hypotension: Yes ?Has patient had a PCN reaction causing severe rash involving mucus membranes or skin necrosis: No ?Has patient had a PCN reaction that required hospitalization: Yes ?  Has patient had a PCN reaction occurring within the last 10 years: No ?If all of the above answers are "NO", then may proceed with Cephalosporin use. ? ?Has patient had a PCN reaction causing immediate rash, facial/tongue/throat swelling, SOB or lightheadedness with hypotension: Yes ?Has patient had a PCN reaction causing severe rash involving mucus membranes or skin necrosis: No ?Has patient had a PCN reaction that required hospitalization: Yes ?Has patient had a PCN reaction occurring within the last 10 years: No ?If all of the above answers are "NO", then may proceed with Cephalosporin use. ?Has patient had a PCN reaction causing immediate rash, facial/tongue/throat swelling, SOB or lightheadedness with hypotension: Yes ?Has patient had a PCN reaction causing severe rash involving mucus membranes or skin necrosis: No ?Has patient had a PCN reaction that required hospitalization: Yes ?Has patient had a PCN reaction occurring  within the last 10 years: No ?If all of the above answers are "NO", then may proceed with Cephalosporin use.  ? Sulfa Antibiotics Hives  ? Tape Other (See Comments)  ?  Large Boils-PAPER TAPE OK TO USE ?Large

## 2021-11-10 NOTE — Patient Instructions (Signed)
[facility] Instructions ?[facility] ?  ?You will receive a box/kit in the mail that will have a urine jug and instructions in the kit.  When the box arrives you will need to call our office [phone] to schedule a LAB appointment. ?  ?You will need to do a 24hour urine and this should be done during the days that our office will be open.  For example any day from Sunday through Thursday. ?  ?If you take Vitamin C 186m or greater please stop this 5 days prior to collection. ?  ?How to collect the urine sample: On the day you start the urine sample this 1st morning urine should NOT be collected.  For the rest of the day including all night urines should be collected.  On the next morning the 1st urine should be collected and then you will be finished with the urine collections. ?  ?You will need to bring the box with you on your LAB appointment day after urine has been collected and all instructions are complete in the box.  Your blood will be drawn and the box will be collected by our Lab employee to be sent off for analysis. ?  ?When urine and blood is complete you will need to schedule a follow up appointment for lab results.  ?

## 2021-11-13 LAB — CULTURE, URINE COMPREHENSIVE

## 2021-11-15 ENCOUNTER — Encounter: Payer: Self-pay | Admitting: Family Medicine

## 2021-11-15 ENCOUNTER — Ambulatory Visit: Payer: Medicaid Other | Admitting: Obstetrics and Gynecology

## 2021-11-15 ENCOUNTER — Other Ambulatory Visit (HOSPITAL_COMMUNITY)
Admission: RE | Admit: 2021-11-15 | Discharge: 2021-11-15 | Disposition: A | Discharge status: Home / Self Care | Payer: Medicaid Other | Source: Ambulatory Visit | Attending: Obstetrics and Gynecology | Admitting: Obstetrics and Gynecology

## 2021-11-15 ENCOUNTER — Encounter: Payer: Self-pay | Admitting: Obstetrics and Gynecology

## 2021-11-15 VITALS — BP 120/80 | Ht 65.0 in | Wt 231.0 lb

## 2021-11-15 DIAGNOSIS — Z01419 Encounter for gynecological examination (general) (routine) without abnormal findings: Secondary | ICD-10-CM

## 2021-11-15 DIAGNOSIS — N83202 Unspecified ovarian cyst, left side: Secondary | ICD-10-CM

## 2021-11-15 DIAGNOSIS — N83201 Unspecified ovarian cyst, right side: Secondary | ICD-10-CM

## 2021-11-15 NOTE — Progress Notes (Signed)
Subjective:  ?  ? Destiny Flores is a 41 y.o. female P0 with LMP 11/14/21 who is here for a comprehensive physical exam. The patient reports some bilateral lower pelvic pain. She has a history of kidney stone and a renal CT incidentally found bilateral simple ovarian cyst. Patient is not sexually active. She reports a monthly period of 3-7 days. She denies any abnormal discharge. She denies urinary incontinence. ? ?Past Medical History:  ?Diagnosis Date  ? Cerebral palsy ([place]) 12/24/2017  ? Complication of anesthesia   ? SEVERE MIGRAINES AFTER ANESTHESIA  ? Epilepsy ([place]) 12/24/2017  ? stress, heat and random things bring on seizures  ? Epilepsy associated with specific stimuli ([place]) 12/24/2017  ? 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 GERD (gastroesophageal reflux disease)   ? Headache   ? Hidradenitis suppurativa 12/24/2017  ? History of kidney stones   ? H/O  ? History of methicillin resistant staphylococcus aureus (MRSA) 2008  ? positive from axilla, left  ? Hx of Kawasaki's disease   ? Hydradenitis   ? Seizures ([place])   ? LAST SEIZURE 2012  ? 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 battery change in 2019, inserted in 2002  ? ?Past Surgical History:  ?Procedure Laterality Date  ? abscess surgery Bilateral 2006  ? armpits and groin.12 surgeries overall.   ? AXILLARY HIDRADENITIS EXCISION    ? MULTIPLE [place]IES [place]  ? hidradenitis groin Left 04/2017  ? last surgery done on groin and the symptoms have returned  ? HYDRADENITIS EXCISION N/A 01/16/2018  ? Procedure: EXCISION HIDRADENITIS GROIN;  Surgeon: Jules Husbands, MD;  Location: [facility] ORS;  Service: General;  Laterality: N/A;  ? KNEE SURGERY Left 2010  ? tendon and meniscus repair  ? NOSE SURGERY  1998  ? deviated septum and sinus repair  ? ROBOTIC ASSISTED LAPAROSCOPIC CHOLECYSTECTOMY N/A 07/24/2018  ? Procedure: ROBOTIC ASSISTED LAPAROSCOPIC CHOLECYSTECTOMY;  Surgeon: Jules Husbands, MD;  Location: [facility] ORS;  Service: General;  Laterality: N/A;  ? TONSILLECTOMY    ? TYMPANOSTOMY TUBE PLACEMENT    ? as a kid  ? VAGUS NERVE STIMULATOR GENERATOR CHANGE  03/2015  ? ?Family History  ?Problem Relation Age of Onset  ? Hyperlipidemia Father   ? Epilepsy Brother   ? ADD / ADHD Brother   ? Breast cancer Paternal Grandmother 82  ?     Passed away at 23yo  ? Asthma Mother   ? ? ?Social History  ? ?Socioeconomic History  ? Marital status: Single  ?  Spouse name: Not on file  ? Number of children: 0  ? Years of education: 75  ? Highest education level: Bachelor's degree (e.g., BA, AB, BS)  ?Occupational History  ? Occupation: unemployeed  ?Tobacco Use  ? Smoking status: Never  ? Smokeless tobacco: Never  ?Vaping Use  ? Vaping Use: Never used  ?Substance and Sexual Activity  ? Alcohol use: Never  ? Drug use: Never  ? Sexual activity: Never  ?  Partners: Male  ?Other Topics Concern  ? Not on file  ?Social History Narrative  ? Not on file  ? ?Social Determinants of Health  ? ?Financial Resource Strain: Low Risk   ? Difficulty of Paying Living Expenses: Not hard at all  ?Food Insecurity: No Food Insecurity  ? Worried About Charity fundraiser in the Last Year: Never true  ? Ran Out of Food in the Last Year:  Never true  ?Transportation Needs: No Transportation Needs  ? Lack of Transportation (Medical): No  ? Lack of Transportation (Non-Medical): No  ?Physical Activity: Insufficiently Active  ? Days of Exercise per Week: 5 days  ? Minutes of Exercise per Session: 20 min  ?Stress: Stress Concern Present  ? Feeling of Stress : To some extent  ?Social Connections: Moderately Isolated  ? Frequency of Communication with Friends and Family: Twice a week  ? Frequency of Social Gatherings with Friends and Family: Twice a week  ? Attends Religious Services: 1 to 4 times per year  ? Active Member of Clubs or Organizations: No  ? Attends Archivist Meetings: Never  ? Marital Status: Never married  ?Intimate  Partner Violence: Not At Risk  ? Fear of Current or Ex-Partner: No  ? Emotionally Abused: No  ? Physically Abused: No  ? Sexually Abused: No  ? ?Health Maintenance  ?Topic Date Due  ? PAP SMEAR-Modifier  Never done  ? COVID-19 Vaccine (5 - Booster for Moderna series) 06/10/2021  ? MAMMOGRAM  08/04/2021  ? INFLUENZA VACCINE  01/30/2022  ? TETANUS/TDAP  03/06/2028  ? Hepatitis C Screening  Completed  ? HIV Screening  Completed  ? HPV VACCINES  Aged Out  ? ? ?  ? ?Review of Systems ?Pertinent items noted in HPI and remainder of comprehensive ROS otherwise negative.  ? ?Objective:  ?Blood pressure 120/80, height 5\' 5"  (1.651 m), weight 231 lb (104.8 kg), last menstrual period 11/14/2021. ? ? GENERAL: Well-developed, well-nourished female in no acute distress.  ?HEENT: Normocephalic, atraumatic. Sclerae anicteric.  ?NECK: Supple. Normal thyroid.  ?LUNGS: Clear to auscultation bilaterally.  ?HEART: Regular rate and rhythm. ?BREASTS: Symmetric in size. No palpable masses or lymphadenopathy, skin changes, or nipple drainage. ?ABDOMEN: Soft, nontender, nondistended. No organomegaly. ?PELVIC: Normal external female genitalia. Vagina is pink and rugated.  Normal discharge. Normal appearing cervix. Bimanual limited secondary to body habitus. Chaperone present during the pelvic exam ?EXTREMITIES: No cyanosis, clubbing, or edema, 2+ distal pulses. ?  ?  ?Assessment:  ? ? Healthy female exam.    ?  ?Plan:  ? ? Pap smear collected ?Pelvic ultrasound ordered to evaluate ovarian cyst ?Patient will be contacted with abnormal results ?See After Visit Summary for Counseling Recommendations  ? ?

## 2021-11-17 LAB — CYTOLOGY - PAP
Adequacy: ABSENT
Comment: NEGATIVE
Diagnosis: NEGATIVE
High risk HPV: NEGATIVE

## 2021-11-20 ENCOUNTER — Encounter: Payer: Self-pay | Admitting: Obstetrics and Gynecology

## 2021-11-24 ENCOUNTER — Ambulatory Visit
Admission: RE | Admit: 2021-11-24 | Discharge: 2021-11-24 | Disposition: A | Discharge status: Home / Self Care | Payer: Medicaid Other | Source: Ambulatory Visit | Attending: Obstetrics and Gynecology | Admitting: Obstetrics and Gynecology

## 2021-11-24 DIAGNOSIS — N83202 Unspecified ovarian cyst, left side: Secondary | ICD-10-CM | POA: Insufficient documentation

## 2021-11-24 DIAGNOSIS — N83201 Unspecified ovarian cyst, right side: Secondary | ICD-10-CM | POA: Diagnosis not present

## 2021-12-02 ENCOUNTER — Other Ambulatory Visit: Payer: Self-pay

## 2021-12-02 ENCOUNTER — Emergency Department
Admission: EM | Admit: 2021-12-02 | Discharge: 2021-12-02 | Disposition: A | Discharge status: Home / Self Care | Payer: Medicaid Other | Attending: Emergency Medicine | Admitting: Emergency Medicine

## 2021-12-02 ENCOUNTER — Emergency Department: Payer: Medicaid Other

## 2021-12-02 ENCOUNTER — Encounter: Payer: Self-pay | Admitting: Emergency Medicine

## 2021-12-02 DIAGNOSIS — W19XXXA Unspecified fall, initial encounter: Secondary | ICD-10-CM

## 2021-12-02 DIAGNOSIS — W010XXA Fall on same level from slipping, tripping and stumbling without subsequent striking against object, initial encounter: Secondary | ICD-10-CM | POA: Insufficient documentation

## 2021-12-02 DIAGNOSIS — S93401A Sprain of unspecified ligament of right ankle, initial encounter: Secondary | ICD-10-CM | POA: Insufficient documentation

## 2021-12-02 DIAGNOSIS — Y92838 Other recreation area as the place of occurrence of the external cause: Secondary | ICD-10-CM | POA: Diagnosis not present

## 2021-12-02 DIAGNOSIS — S99911A Unspecified injury of right ankle, initial encounter: Secondary | ICD-10-CM | POA: Diagnosis present

## 2021-12-02 DIAGNOSIS — S80219A Abrasion, unspecified knee, initial encounter: Secondary | ICD-10-CM | POA: Insufficient documentation

## 2021-12-02 DIAGNOSIS — M25571 Pain in right ankle and joints of right foot: Secondary | ICD-10-CM | POA: Diagnosis not present

## 2021-12-02 MED ORDER — ACETAMINOPHEN 500 MG PO TABS: 1000.0000 mg | ORAL_TABLET | Freq: Four times a day (QID) | ORAL | 2 refills | Status: DC | PRN

## 2021-12-02 MED ORDER — OXYCODONE HCL 5 MG PO TABS
10.0000 mg | ORAL_TABLET | Freq: Once | ORAL | Status: AC
  Administered 2021-12-02: 10 mg via ORAL
  Filled 2021-12-02: qty 2

## 2021-12-02 MED ORDER — NAPROXEN 500 MG PO TABS: 500.0000 mg | ORAL_TABLET | Freq: Two times a day (BID) | ORAL | 11 refills | Status: AC

## 2021-12-02 NOTE — ED Provider Notes (Signed)
[facility] Provider Note    Event Date/Time   First MD Initiated Contact with Patient 12/02/21 2056     (approximate)   History   Chief Complaint Fall, Ankle Pain, and Leg Pain   HPI Destiny Flores is a 41 y.o. female, history of epilepsy, several palsy, hyperlipidemia, GERD, morbid obesity, presents to the emergency department for evaluation of ankle pain.  Patient states that she was leaving her brother's graduation party when she stepped wrong on the road causing her to fall and twist her right ankle.  She suffered from a minor abrasion on the knee as well.  She is still able to walk, though with significant difficulty.  Denies any head injury or LOC.  Denies pain anywhere else on the body.  History Limitations: No limitations.        Physical Exam  Triage Vital Signs: ED Triage Vitals  Enc Vitals Group     BP 12/02/21 1848 136/70     Pulse Rate 12/02/21 1759 (!) 110     Resp 12/02/21 1759 20     Temp 12/02/21 1759 98.6 F (37 C)     Temp Source 12/02/21 1759 Oral     SpO2 12/02/21 1759 96 %     Weight 12/02/21 1757 229 lb 4.5 oz (104 kg)     Height 12/02/21 1757 5\' 5"  (1.651 m)     Head Circumference --      Peak Flow --      Pain Score 12/02/21 1757 10     Pain Loc --      Pain Edu? --      Excl. in GC? --     Most recent vital signs: Vitals:   12/02/21 1848 12/02/21 2245  BP: 136/70 130/74  Pulse:  98  Resp:  18  Temp:  98 F (36.7 C)  SpO2:  95%    General: Awake, NAD.  Skin: Warm, dry. No rashes or lesions.  Eyes: PERRL. Conjunctivae normal.  CV: Good peripheral perfusion.  Resp: Normal effort.  Abd: Soft, non-tender. No distention.  Neuro: At baseline. No gross neurological deficits.   Focused Exam: Diffuse swelling appreciated along the right foot/ankle.  Ecchymosis appreciated along the metatarsals.  No laxity in the ankle joint appreciated.  Flexion and extension maintained, there was significant difficulty due to  pain.  Pulse, motor, sensation intact distally.  Bony tenderness along the lateral malleolus and the midshaft metatarsals.  Physical Exam    ED Results / Procedures / Treatments  Labs (all labs ordered are listed, but only abnormal results are displayed) Labs Reviewed - No data to display   EKG N/A   RADIOLOGY  ED Provider Interpretation: I personally viewed and interpreted this plain film.  No evidence of any fractures or dislocations  DG Ankle Complete Right  Result Date: 12/02/2021 CLINICAL DATA:  Acute RIGHT ankle pain and swelling following fall. Initial encounter. EXAM: RIGHT ANKLE - COMPLETE 3+ VIEW COMPARISON:  09/28/2021 radiographs FINDINGS: No acute fracture, subluxation or dislocation identified. Anterior and lateral soft tissue swelling is noted. No focal bony lesions are present. The joint spaces are unremarkable. IMPRESSION: Soft tissue swelling without acute bony abnormality. Electronically Signed   By: 09/30/2021 M.D.   On: 12/02/2021 18:33    PROCEDURES:  Critical Care performed: None.  Procedures    MEDICATIONS ORDERED IN ED: Medications  oxyCODONE (Oxy IR/ROXICODONE) immediate release tablet 10 mg (10 mg Oral Given 12/02/21 2242)  IMPRESSION / MDM / ASSESSMENT AND PLAN / ED COURSE  I reviewed the triage vital signs and the nursing notes.                              Differential diagnosis includes, but is not limited to, ankle sprain, stress fracture, metatarsal fracture, malleolus fracture  ED Course Patient appears well, vitals within normal limits.  We will go ahead treat pain with oxycodone 10 mg p.o.  Assessment/Plan Presentation consistent with ankle sprain.  X-ray shows mild soft tissue swelling, but otherwise no evidence of fractures or dislocations.  Given the significant pain along her metatarsals, I suspect she potentially has stress fractures as well.  We will provide her with a cam boot and crutches.  We will additionally provide her  with a referral for orthopedics for follow-up.  Prescribed her acetaminophen and naproxen to control pain at home.  We will plan to discharge.  Patient's presentation is most consistent with acute complicated illness / injury requiring diagnostic workup.   Provided the patient with anticipatory guidance, return precautions, and educational material. Encouraged the patient to return to the emergency department at any time if they begin to experience any new or worsening symptoms. Patient expressed understanding and agreed with the plan.       FINAL CLINICAL IMPRESSION(S) / ED DIAGNOSES   Final diagnoses:  Sprain of right ankle, unspecified ligament, initial encounter  Fall, initial encounter     Rx / DC Orders   ED Discharge Orders          Ordered    naproxen (NAPROSYN) 500 MG tablet  2 times daily with meals        12/02/21 2233    acetaminophen (TYLENOL) 500 MG tablet  Every 6 hours PRN        12/02/21 2233             Note:  This document was prepared using Dragon voice recognition software and may include unintentional dictation errors.   Varney Daily, [place] 12/03/21 3810    Georga Hacking, MD 12/03/21 [phone]

## 2021-12-02 NOTE — Discharge Instructions (Addendum)
-  Keep the boot on while walking.  You may remove it while at rest.  It may take up to 4 to 6 weeks to fully recover.  You may utilize the crutches as needed.  -Take Tylenol and/or naproxen as needed for pain.  -Follow-up with the orthopedist listed above if your symptoms fail to improve after 4 to 6 weeks  -Return to the emergency department anytime if you begin to experience any new or worsening symptoms.

## 2021-12-02 NOTE — ED Triage Notes (Signed)
Pt reports was leaving her brothers graduation party walking to get the car and stepped wrong on the road falling and twisting her right ankle and hurting her left knee. Redness and swelling noted to right ankle, Abrasion noted to left leg below knee

## 2021-12-04 ENCOUNTER — Telehealth: Payer: Self-pay

## 2021-12-04 NOTE — Telephone Encounter (Signed)
Transition Care Management Follow-up Telephone Call Date of discharge and from where: 12/02/2021 from [facility] How have you been since you were released from the hospital? Patient stated that she is feeling well. Patient did not have any questions or concerns at this time.  Any questions or concerns? No  Items Reviewed: Did the pt receive and understand the discharge instructions provided? Yes  Medications obtained and verified? Yes  Other? No  Any new allergies since your discharge? No  Dietary orders reviewed? No Do you have support at home? Yes   Functional Questionnaire: (I = Independent and D = Dependent) ADLs: I  Bathing/Dressing- I  Meal Prep- I  Eating- I  Maintaining continence- I  Transferring/Ambulation- I  Managing Meds- I   Follow up appointments reviewed:  PCP Hospital f/u appt confirmed? No   Specialist Hospital f/u appt confirmed? Yes  Scheduled to see Ortho on 12/06/2021. Are transportation arrangements needed? No  If their condition worsens, is the pt aware to call PCP or go to the Emergency Dept.? Yes Was the patient provided with contact information for the PCP's office or ED? Yes Was to pt encouraged to call back with questions or concerns? Yes

## 2021-12-06 DIAGNOSIS — S93401A Sprain of unspecified ligament of right ankle, initial encounter: Secondary | ICD-10-CM | POA: Insufficient documentation

## 2021-12-11 DIAGNOSIS — G43909 Migraine, unspecified, not intractable, without status migrainosus: Secondary | ICD-10-CM | POA: Diagnosis not present

## 2021-12-11 DIAGNOSIS — G4733 Obstructive sleep apnea (adult) (pediatric): Secondary | ICD-10-CM | POA: Diagnosis not present

## 2021-12-11 DIAGNOSIS — Z9689 Presence of other specified functional implants: Secondary | ICD-10-CM | POA: Diagnosis not present

## 2021-12-11 DIAGNOSIS — R0683 Snoring: Secondary | ICD-10-CM | POA: Diagnosis not present

## 2021-12-20 DIAGNOSIS — S93401A Sprain of unspecified ligament of right ankle, initial encounter: Secondary | ICD-10-CM | POA: Diagnosis not present

## 2021-12-25 ENCOUNTER — Other Ambulatory Visit: Payer: Self-pay | Admitting: Family Medicine

## 2021-12-25 DIAGNOSIS — K219 Gastro-esophageal reflux disease without esophagitis: Secondary | ICD-10-CM

## 2022-01-01 DIAGNOSIS — Z9689 Presence of other specified functional implants: Secondary | ICD-10-CM | POA: Diagnosis not present

## 2022-01-01 DIAGNOSIS — G4730 Sleep apnea, unspecified: Secondary | ICD-10-CM | POA: Diagnosis not present

## 2022-01-01 DIAGNOSIS — G43909 Migraine, unspecified, not intractable, without status migrainosus: Secondary | ICD-10-CM | POA: Diagnosis not present

## 2022-01-01 DIAGNOSIS — G40909 Epilepsy, unspecified, not intractable, without status epilepticus: Secondary | ICD-10-CM | POA: Diagnosis not present

## 2022-01-16 ENCOUNTER — Telehealth: Payer: Self-pay

## 2022-01-16 NOTE — Patient Outreach (Signed)
Care Coordination  01/16/2022  [name] [birth year] [id number]   Medicaid Managed Care   Unsuccessful Outreach Note  01/16/2022 Name: Destiny Flores MRN: [id number] DOB: 27-Sep-1980  Referred by: Danelle Berry, PA-C Reason for referral : High Risk Managed Medicaid (MM Social Work [employer])   An unsuccessful telephone outreach was attempted today. The patient was referred to the case management team for assistance with care management and care coordination.   Follow Up Plan: The care management team will reach out to the patient again over the next 7 days.   Gus Puma, BSW, [facility]  [place]  High Risk Managed Medicaid Team  [phone]

## 2022-01-16 NOTE — Patient Instructions (Signed)
Visit Information  Ms. Destiny Flores  - as a part of your Medicaid benefit, you are eligible for care management and care coordination services at no cost or copay. I was unable to reach you by phone today but would be happy to help you with your health related needs. Please feel free to call me @ [phone].   A member of the Managed Medicaid care management team will reach out to you again over the next 7 days.   Gus Puma, BSW, [facility]  [place]  High Risk Managed Medicaid Team  [phone]

## 2022-01-19 NOTE — Unmapped (Signed)
Specialty Medication(s): Humira    Ms.Horseman has been dis-enrolled from the [facility] specialty pharmacy services due to multiple unsuccessful outreach attempts by the pharmacy.    Additional information provided to the patient: Left additional message at [phone]). Previously had tried several times in March with no response. Visit in April showed medication was to be continued. Also sending mychart message.     Lanney Gins  [facility] Specialty Pharmacist

## 2022-01-31 ENCOUNTER — Telehealth: Payer: Self-pay

## 2022-01-31 NOTE — Patient Outreach (Signed)
Care Coordination  01/31/2022  Destiny Flores 1981/05/13 [id number]   Medicaid Managed Care   Unsuccessful Outreach Note  01/31/2022 Name: Destiny Flores MRN: [id number] DOB: 04-18-1981  Referred by: Danelle Berry, PA-C Reason for referral : High Risk Managed Medicaid (MM Social Work Unsuccessful telephone outreach)   A second unsuccessful telephone outreach was attempted today. The patient was referred to the case management team for assistance with care management and care coordination.   Follow Up Plan: The care management team will reach out to the patient again over the next 7 days.   Gus Puma, BSW, [facility]  [place]  High Risk Managed Medicaid Team  [phone]

## 2022-01-31 NOTE — Patient Instructions (Signed)
Visit Information  Ms. Destiny Flores  - as a part of your Medicaid benefit, you are eligible for care management and care coordination services at no cost or copay. I was unable to reach you by phone today but would be happy to help you with your health related needs. Please feel free to call me @ [phone].   A member of the Managed Medicaid care management team will reach out to you again over the next 7 days.   Gus Puma, BSW, [facility]  [place]  High Risk Managed Medicaid Team  [phone]

## 2022-03-29 ENCOUNTER — Ambulatory Visit: Payer: Medicaid Other | Admitting: Family Medicine

## 2022-03-29 ENCOUNTER — Encounter: Payer: Self-pay | Admitting: Family Medicine

## 2022-03-29 VITALS — BP 110/76 | HR 91 | Temp 98.1°F | Resp 16 | Ht 65.0 in | Wt 231.8 lb

## 2022-03-29 DIAGNOSIS — Z79899 Other long term (current) drug therapy: Secondary | ICD-10-CM | POA: Diagnosis not present

## 2022-03-29 DIAGNOSIS — D84821 Immunodeficiency due to drugs: Secondary | ICD-10-CM | POA: Diagnosis not present

## 2022-03-29 DIAGNOSIS — E785 Hyperlipidemia, unspecified: Secondary | ICD-10-CM

## 2022-03-29 DIAGNOSIS — Z9689 Presence of other specified functional implants: Secondary | ICD-10-CM | POA: Diagnosis not present

## 2022-03-29 DIAGNOSIS — L732 Hidradenitis suppurativa: Secondary | ICD-10-CM | POA: Diagnosis not present

## 2022-03-29 DIAGNOSIS — E559 Vitamin D deficiency, unspecified: Secondary | ICD-10-CM

## 2022-03-29 DIAGNOSIS — Z5181 Encounter for therapeutic drug level monitoring: Secondary | ICD-10-CM | POA: Diagnosis not present

## 2022-03-29 DIAGNOSIS — Z23 Encounter for immunization: Secondary | ICD-10-CM | POA: Diagnosis not present

## 2022-03-29 DIAGNOSIS — D75839 Thrombocytosis, unspecified: Secondary | ICD-10-CM

## 2022-03-29 DIAGNOSIS — G809 Cerebral palsy, unspecified: Secondary | ICD-10-CM

## 2022-03-29 DIAGNOSIS — G40909 Epilepsy, unspecified, not intractable, without status epilepticus: Secondary | ICD-10-CM

## 2022-03-29 DIAGNOSIS — K219 Gastro-esophageal reflux disease without esophagitis: Secondary | ICD-10-CM | POA: Diagnosis not present

## 2022-03-29 NOTE — Assessment & Plan Note (Signed)
still taking protonix once daily, cannot wean off and she is still symptomatic with diet and lifestyle triggers She as not been to GI recently, overall sx are better than they were Triggers - certain foods, eating late at night  Discussed avoiding triggers, using protonix preventatively, can still use pepcid prn Recommend GI consult to est locally

## 2022-03-29 NOTE — Assessment & Plan Note (Signed)
Chronic, stable 

## 2022-03-29 NOTE — Assessment & Plan Note (Signed)
Chronic, has been stable, previously consulted hematology, monitoring

## 2022-03-29 NOTE — Assessment & Plan Note (Signed)
labs checked in March,, elevated and not on meds She has lost weight and worked on diet/lifestyle efforts She is low risk, will recheck labs again next year with CPE The 10-year ASCVD risk score (Arnett DK, et al., 2019) is: 0.8%

## 2022-03-29 NOTE — Assessment & Plan Note (Signed)
Pt overdue to f/up with dermatology She is on high dose spironolactone - will recheck labs today PT BP in normal range, she hasn't been having neary syncope or hypotension

## 2022-03-29 NOTE — Assessment & Plan Note (Signed)
No recent seizures Stable Per neurology

## 2022-03-29 NOTE — Progress Notes (Signed)
Name: Destiny Flores   MRN: [id number]    DOB: 1981/06/25   Date:03/29/2022       Progress Note  Chief Complaint  Patient presents with   Follow-up   Hyperlipidemia   Gastroesophageal Reflux     Subjective:   Destiny Flores is a 41 y.o. female, presents to clinic for routine f/up  HLD, labs checked in March,, elevated and not on meds She has lost weight and worked on diet/lifestyle efforts She is low risk, will recheck labs again next year with CPE The 10-year ASCVD risk score (Arnett DK, et al., 2019) is: 0.8%   Values used to calculate the score:     Age: 66 years     Sex: Female     Is Non-Hispanic African American: No     Diabetic: No     Tobacco smoker: No     Systolic Blood Pressure: 110 mmHg     Is BP treated: No     HDL Cholesterol: 47 mg/dL     Total Cholesterol: 226 mg/dL   GERD - still taking protonix once daily, cannot wean off and she is still symptomatic with diet and lifestyle triggers She as not been to GI recently, overall sx are better than they were Triggers - certain foods, eating late at night   Last vitamin D  Labs not checked in a while, Vit D supplement dose was sig decreased in the past 1-2 years due to abnormal calcium and suspected overdosing of vit d Recheck today Lab Results  Component Value Date   VD25OH 61 05/17/2020   HS - per dermatology She has held the humira - it wasn't helping On clinda for flares and she had spironolactone dose increased to 200 mg daily No labs since that time (April or march this year) she was instructed to take 150 mg if she experienced lightheadedness She has pushed fluids with spironolactone due to recently and previously this med causing kidney stones, she has not had trouble since being more careful with fluids and electrolytes  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 Use in each nostril as directed, Disp: 30 mL, Rfl: 12   Brivaracetam (BRIVIACT) 100 MG TABS, Take 1 tablet by mouth 2 (two) times daily., Disp: , Rfl:    famotidine (PEPCID) 40 MG tablet, Take 0.5 tablets (20 mg total) by mouth 2 (two) times daily as needed for heartburn or indigestion., Disp: 180 tablet, Rfl: 1   felbamate (FELBATOL) 600 MG tablet, Take 600 mg by mouth 3 (three) times daily., Disp: , Rfl:    levETIRAcetam (KEPPRA) 750 MG tablet, Take 1,500 mg by mouth 2 (two) times daily., Disp: , Rfl:    magnesium oxide (MAG-OX) 400 MG tablet, Take by mouth., Disp: , Rfl:    Melatonin 3 MG CAPS, Take by mouth., Disp: , Rfl:    naproxen (NAPROSYN) 500 MG tablet, Take  1 tablet (500 mg total) by mouth 2 (two) times daily with a meal., Disp: 60 tablet, Rfl: 11   pantoprazole (PROTONIX) 40 MG tablet, TAKE 1 TABLET BY MOUTH TWICE A DAY, Disp: 180 tablet, Rfl: 1   pyridOXINE (VITAMIN B-6) 100 MG tablet, Take 100 mg by mouth daily., Disp: , Rfl:    spironolactone (ALDACTONE) 100 MG tablet, Take by mouth., Disp: , Rfl:    topiramate (TOPAMAX) 25 MG tablet, Take 50 mg by mouth at bedtime., Disp: , Rfl:    TURMERIC CURCUMIN PO, Take 2 capsules by mouth daily., Disp: , Rfl:    benzonatate (TESSALON) 100 MG capsule, Take 1 capsule (100 mg total) by mouth 3 (three) times daily as needed for cough., Disp: 30 capsule, Rfl: 0   SUMAtriptan (IMITREX) 50 MG tablet, Take 50 mg by mouth daily as needed., Disp: , Rfl:    tamsulosin (FLOMAX) 0.4 MG CAPS capsule, Take 1 capsule (0.4 mg total) by mouth daily. 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    AXILLARY HIDRADENITIS EXCISION     MULTIPLE HYDRADENITIS SURGERIES IN [place]   hidradenitis groin Left 04/2017   last surgery done on groin and the symptoms have returned   HYDRADENITIS EXCISION N/A 01/16/2018   Procedure: EXCISION HIDRADENITIS GROIN;  Surgeon: Leafy Ro, MD;  Location: [facility] ORS;  Service: General;  Laterality: N/A;   KNEE SURGERY Left 2010   tendon and meniscus repair   NOSE SURGERY  1998   deviated septum and sinus repair   ROBOTIC ASSISTED LAPAROSCOPIC CHOLECYSTECTOMY N/A 07/24/2018   Procedure: ROBOTIC ASSISTED LAPAROSCOPIC CHOLECYSTECTOMY;  Surgeon: Leafy Ro, MD;  Location: [facility] ORS;  Service: General;  Laterality: N/A;   TONSILLECTOMY     TYMPANOSTOMY TUBE PLACEMENT     as a kid   VAGUS NERVE STIMULATOR GENERATOR CHANGE  03/2015    Family History  Problem Relation Age of Onset   Hyperlipidemia Father    Epilepsy Brother    ADD / ADHD Brother    Breast cancer Paternal Grandmother 36       Passed away at 11yo   Asthma Mother     Social History   Tobacco Use   Smoking status: Never   Smokeless tobacco: Never  Vaping Use    Vaping Use: Never used  Substance Use Topics   Alcohol use: Never   Drug use: Never     Allergies  Allergen Reactions   Codeine Anaphylaxis   Penicillins Anaphylaxis and Other (See Comments)    Has patient had a PCN reaction causing immediate rash, facial/tongue/throat swelling, SOB or lightheadedness with hypotension: Yes Has patient had a PCN reaction causing severe rash involving mucus membranes or skin necrosis: No Has patient had a  PCN reaction that required hospitalization: Yes Has patient had a PCN reaction occurring within the last 10 years: No If all of the above answers are "NO", then may proceed with Cephalosporin use.  Has patient had a PCN reaction causing immediate rash, facial/tongue/throat swelling, SOB or lightheadedness with hypotension: Yes Has patient had a PCN reaction causing severe rash involving mucus membranes or skin necrosis: No Has patient had a PCN reaction that required hospitalization: Yes Has patient had a PCN reaction occurring within the last 10 years: No If all of the above answers are "NO", then may proceed with Cephalosporin use. Has patient had a PCN reaction causing immediate rash, facial/tongue/throat swelling, SOB or lightheadedness with hypotension: Yes Has patient had a PCN reaction causing severe rash involving mucus membranes or skin necrosis: No Has patient had a PCN reaction that required hospitalization: Yes Has patient had a PCN reaction occurring within the last 10 years: No If all of the above answers are "NO", then may proceed with Cephalosporin use.   Sulfa Antibiotics Hives   Tape Other (See Comments)    Large Boils-PAPER TAPE OK TO USE Large Boils-PAPER TAPE OK TO USE    Health Maintenance  Topic Date Due   MAMMOGRAM  08/04/2021   COVID-19 Vaccine (5 - Moderna risk series) 04/14/2022 (Originally 06/10/2021)   INFLUENZA VACCINE  09/30/2022 (Originally 01/30/2022)   PAP SMEAR-Modifier  11/15/2024   TETANUS/TDAP  03/06/2028    Hepatitis C Screening  Completed   HIV Screening  Completed   HPV VACCINES  Aged Out    Chart Review Today: I personally reviewed active problem list, medication list, allergies, family history, social history, health maintenance, notes from last encounter, lab results, imaging with the patient/caregiver today.   Review of Systems  Constitutional: Negative.   HENT: Negative.    Eyes: Negative.   Respiratory: Negative.    Cardiovascular: Negative.   Gastrointestinal: Negative.   Endocrine: Negative.   Genitourinary: Negative.   Musculoskeletal: Negative.   Skin: Negative.   Allergic/Immunologic: Negative.   Neurological: Negative.   Hematological: Negative.   Psychiatric/Behavioral: Negative.    All other systems reviewed and are negative.    Objective:   Vitals:   03/29/22 0816  BP: 110/76  Pulse: 91  Resp: 16  Temp: 98.1 F (36.7 C)  TempSrc: Oral  SpO2: 99%  Weight: 231 lb 12.8 oz (105.1 kg)  Height: 5\' 5"  (1.651 m)    Body mass index is 38.57 kg/m.  Physical Exam Vitals and nursing note reviewed.  Constitutional:      General: She is not in acute distress.    Appearance: Normal appearance. She is well-developed. She is obese. She is not ill-appearing, toxic-appearing or diaphoretic.  HENT:     Head: Normocephalic and atraumatic.     Nose: Nose normal.  Eyes:     General:        Right eye: No discharge.        Left eye: No discharge.     Conjunctiva/sclera: Conjunctivae normal.  Neck:     Trachea: No tracheal deviation.  Cardiovascular:     Rate and Rhythm: Normal rate and regular rhythm.     Pulses: Normal pulses.     Heart sounds: Normal heart sounds.  Pulmonary:     Effort: Pulmonary effort is normal. No respiratory distress.     Breath sounds: No stridor.  Musculoskeletal:        General: Normal range of motion.  Skin:  General: Skin is warm and dry.     Findings: Bruising (left forearm) present. No rash.  Neurological:     Mental  Status: She is alert.     Motor: No abnormal muscle tone.     Coordination: Coordination normal.  Psychiatric:        Behavior: Behavior normal.         Assessment & Plan:   Problem List Items Addressed This Visit       Digestive   Gastroesophageal reflux disease without esophagitis - Primary    still taking protonix once daily, cannot wean off and she is still symptomatic with diet and lifestyle triggers She as not been to GI recently, overall sx are better than they were Triggers - certain foods, eating late at night  Discussed avoiding triggers, using protonix preventatively, can still use pepcid prn Recommend GI consult to est locally         Nervous and Auditory   Cerebral palsy (HCC)    Chronic, stable      Epilepsy (HCC)    No recent seizures Stable Per neurology        Musculoskeletal and Integument   Hidradenitis suppurativa    Pt overdue to f/up with dermatology She is on high dose spironolactone - will recheck labs today PT BP in normal range, she hasn't been having neary syncope or hypotension        Hematopoietic and Hemostatic   Thrombocytosis    Chronic, has been stable, previously consulted hematology, monitoring      Relevant Orders   CBC with Differential/Platelet     Other   Status post VNS (vagus nerve stimulator) placement   Vitamin D deficiency    Supplement doses have decreased and changed so we will recheck labs      Relevant Orders   COMPLETE METABOLIC PANEL WITH GFR   VITAMIN D 25 Hydroxy (Vit-D Deficiency, Fractures)   Immunosuppression due to drug therapy (HCC)   Relevant Orders   CBC with Differential/Platelet   COMPLETE METABOLIC PANEL WITH GFR   Hyperlipidemia    labs checked in March,, elevated and not on meds She has lost weight and worked on diet/lifestyle efforts She is low risk, will recheck labs again next year with CPE The 10-year ASCVD risk score (Arnett DK, et al., 2019) is: 0.8%      Relevant Orders    COMPLETE METABOLIC PANEL WITH GFR   Other Visit Diagnoses     Need for influenza vaccination       not done today   Encounter for medication monitoring       monitoring renal function, electrolytes, cbc for all her other specialists meds, no labs done in ~6 months   Relevant Orders   CBC with Differential/Platelet   COMPLETE METABOLIC PANEL WITH GFR   VITAMIN D 25 Hydroxy (Vit-D Deficiency, Fractures)        No follow-ups on file.   Danelle Berry, PA-C 03/29/22 8:39 AM

## 2022-03-29 NOTE — Assessment & Plan Note (Signed)
Supplement doses have decreased and changed so we will recheck labs

## 2022-03-30 LAB — CBC WITH DIFFERENTIAL/PLATELET
Absolute Monocytes: 822 cells/uL (ref 200–950)
Basophils Absolute: 96 cells/uL (ref 0–200)
Basophils Relative: 0.7 %
Eosinophils Absolute: 301 cells/uL (ref 15–500)
Eosinophils Relative: 2.2 %
HCT: 39.9 % (ref 35.0–45.0)
Hemoglobin: 13.4 g/dL (ref 11.7–15.5)
Lymphs Abs: 3562 cells/uL (ref 850–3900)
MCH: 30.9 pg (ref 27.0–33.0)
MCHC: 33.6 g/dL (ref 32.0–36.0)
MCV: 92.1 fL (ref 80.0–100.0)
MPV: 9.3 fL (ref 7.5–12.5)
Monocytes Relative: 6 %
Neutro Abs: 8919 cells/uL — ABNORMAL HIGH (ref 1500–7800)
Neutrophils Relative %: 65.1 %
Platelets: 527 10*3/uL — ABNORMAL HIGH (ref 140–400)
RBC: 4.33 10*6/uL (ref 3.80–5.10)
RDW: 12.7 % (ref 11.0–15.0)
Total Lymphocyte: 26 %
WBC: 13.7 10*3/uL — ABNORMAL HIGH (ref 3.8–10.8)

## 2022-03-30 LAB — COMPLETE METABOLIC PANEL WITH GFR
AG Ratio: 1.4 (calc) (ref 1.0–2.5)
ALT: 16 U/L (ref 6–29)
AST: 13 U/L (ref 10–30)
Albumin: 4 g/dL (ref 3.6–5.1)
Alkaline phosphatase (APISO): 80 U/L (ref 31–125)
BUN: 12 mg/dL (ref 7–25)
CO2: 25 mmol/L (ref 20–32)
Calcium: 9.6 mg/dL (ref 8.6–10.2)
Chloride: 104 mmol/L (ref 98–110)
Creat: 0.74 mg/dL (ref 0.50–0.99)
Globulin: 2.9 g/dL (calc) (ref 1.9–3.7)
Glucose, Bld: 84 mg/dL (ref 65–99)
Potassium: 4.6 mmol/L (ref 3.5–5.3)
Sodium: 136 mmol/L (ref 135–146)
Total Bilirubin: 0.3 mg/dL (ref 0.2–1.2)
Total Protein: 6.9 g/dL (ref 6.1–8.1)
eGFR: 104 mL/min/{1.73_m2} (ref >=60)

## 2022-03-30 LAB — VITAMIN D 25 HYDROXY (VIT D DEFICIENCY, FRACTURES): Vit D, 25-Hydroxy: 42 ng/mL (ref 30–100)

## 2022-04-06 ENCOUNTER — Emergency Department
Admission: EM | Admit: 2022-04-06 | Discharge: 2022-04-06 | Disposition: A | Discharge status: Home / Self Care | Payer: Medicaid Other | Attending: Emergency Medicine | Admitting: Emergency Medicine

## 2022-04-06 ENCOUNTER — Other Ambulatory Visit: Payer: Self-pay

## 2022-04-06 ENCOUNTER — Emergency Department: Payer: Medicaid Other

## 2022-04-06 DIAGNOSIS — K5730 Diverticulosis of large intestine without perforation or abscess without bleeding: Secondary | ICD-10-CM | POA: Diagnosis not present

## 2022-04-06 DIAGNOSIS — R109 Unspecified abdominal pain: Secondary | ICD-10-CM | POA: Diagnosis not present

## 2022-04-06 DIAGNOSIS — N12 Tubulo-interstitial nephritis, not specified as acute or chronic: Secondary | ICD-10-CM | POA: Diagnosis not present

## 2022-04-06 DIAGNOSIS — M545 Low back pain, unspecified: Secondary | ICD-10-CM | POA: Diagnosis present

## 2022-04-06 DIAGNOSIS — N83201 Unspecified ovarian cyst, right side: Secondary | ICD-10-CM | POA: Diagnosis not present

## 2022-04-06 DIAGNOSIS — N200 Calculus of kidney: Secondary | ICD-10-CM | POA: Diagnosis not present

## 2022-04-06 DIAGNOSIS — K439 Ventral hernia without obstruction or gangrene: Secondary | ICD-10-CM | POA: Diagnosis not present

## 2022-04-06 LAB — URINALYSIS, ROUTINE W REFLEX MICROSCOPIC
Bilirubin Urine: NEGATIVE
Glucose, UA: NEGATIVE mg/dL
Ketones, ur: NEGATIVE mg/dL
Nitrite: NEGATIVE
Protein, ur: NEGATIVE mg/dL
RBC / HPF: >50 RBC/hpf — ABNORMAL HIGH (ref 0–5)
Specific Gravity, Urine: 1.008 (ref 1.005–1.030)
pH: 6 (ref 5.0–8.0)

## 2022-04-06 LAB — CBC WITH DIFFERENTIAL/PLATELET
Abs Immature Granulocytes: 0.04 10*3/uL (ref 0.00–0.07)
Basophils Absolute: 0.1 10*3/uL (ref 0.0–0.1)
Basophils Relative: 1 %
Eosinophils Absolute: 0.2 10*3/uL (ref 0.0–0.5)
Eosinophils Relative: 2 %
HCT: 41.8 % (ref 36.0–46.0)
Hemoglobin: 13.7 g/dL (ref 12.0–15.0)
Immature Granulocytes: 0 %
Lymphocytes Relative: 24 %
Lymphs Abs: 3 10*3/uL (ref 0.7–4.0)
MCH: 30 pg (ref 26.0–34.0)
MCHC: 32.8 g/dL (ref 30.0–36.0)
MCV: 91.5 fL (ref 80.0–100.0)
Monocytes Absolute: 0.6 10*3/uL (ref 0.1–1.0)
Monocytes Relative: 5 %
Neutro Abs: 8.4 10*3/uL — ABNORMAL HIGH (ref 1.7–7.7)
Neutrophils Relative %: 68 %
Platelets: 578 10*3/uL — ABNORMAL HIGH (ref 150–400)
RBC: 4.57 MIL/uL (ref 3.87–5.11)
RDW: 13 % (ref 11.5–15.5)
WBC: 12.3 10*3/uL — ABNORMAL HIGH (ref 4.0–10.5)
nRBC: 0 % (ref 0.0–0.2)

## 2022-04-06 LAB — COMPREHENSIVE METABOLIC PANEL
ALT: 19 U/L (ref 0–44)
AST: 22 U/L (ref 15–41)
Albumin: 4.3 g/dL (ref 3.5–5.0)
Alkaline Phosphatase: 80 U/L (ref 38–126)
Anion gap: 6 (ref 5–15)
BUN: 13 mg/dL (ref 6–20)
CO2: 24 mmol/L (ref 22–32)
Calcium: 9.4 mg/dL (ref 8.9–10.3)
Chloride: 106 mmol/L (ref 98–111)
Creatinine, Ser: 0.56 mg/dL (ref 0.44–1.00)
GFR, Estimated: >60 mL/min (ref >60)
Glucose, Bld: 92 mg/dL (ref 70–99)
Potassium: 4 mmol/L (ref 3.5–5.1)
Sodium: 136 mmol/L (ref 135–145)
Total Bilirubin: 0.4 mg/dL (ref 0.3–1.2)
Total Protein: 8.4 g/dL — ABNORMAL HIGH (ref 6.5–8.1)

## 2022-04-06 LAB — POC URINE PREG, ED: Preg Test, Ur: NEGATIVE

## 2022-04-06 MED ORDER — FOSFOMYCIN TROMETHAMINE 3 G PO PACK: 3.0000 g | PACK | ORAL | 0 refills | Status: AC

## 2022-04-06 MED ORDER — FLUCONAZOLE 150 MG PO TABS: 150.0000 mg | ORAL_TABLET | Freq: Every day | ORAL | 0 refills | Status: DC

## 2022-04-06 MED ORDER — SODIUM CHLORIDE 0.9 % IV SOLN
1.0000 g | Freq: Once | INTRAVENOUS | Status: AC
  Administered 2022-04-06: 1 g via INTRAVENOUS
  Filled 2022-04-06: qty 5

## 2022-04-06 MED ORDER — SODIUM CHLORIDE 0.9 % IV BOLUS
1000.0000 mL | Freq: Once | INTRAVENOUS | Status: AC
  Administered 2022-04-06: 1000 mL via INTRAVENOUS

## 2022-04-06 MED ORDER — HYDROCODONE-ACETAMINOPHEN 5-325 MG PO TABS: 1.0000 | ORAL_TABLET | ORAL | 0 refills | Status: DC | PRN

## 2022-04-06 MED ORDER — OXYCODONE-ACETAMINOPHEN 5-325 MG PO TABS
1.0000 | ORAL_TABLET | Freq: Once | ORAL | Status: AC
  Administered 2022-04-06: 1 via ORAL
  Filled 2022-04-06: qty 1

## 2022-04-06 MED ORDER — PHENAZOPYRIDINE HCL 100 MG PO TABS: 100.0000 mg | ORAL_TABLET | Freq: Three times a day (TID) | ORAL | 0 refills | Status: AC | PRN

## 2022-04-06 NOTE — ED Triage Notes (Signed)
Pt coming from home today with lower back pain starting last night. Pt has PMH of kidney stones, but is not endorsing flank pain today, pt stating pain starting centralized in spine and radiating laterally.   Pt denies any injury to back  related. PT denies any urinary symptoms at this time. But states their previous kidney stones felt similar. Pt also endorsing they are on their period at this time but does not get cramps/back pain.

## 2022-04-06 NOTE — ED Provider Notes (Signed)
[facility] Provider Note  Patient Contact: 5:54 PM (approximate)   History   Back Pain   HPI  Destiny Flores is a 41 y.o. female who presents the emergency department complaining of lower back pain worse on the right than left.  She does have a history of kidney stones, similar pain with her kidney stones.  Patient denies any hematuria, dysuria, vaginal discharge.  Patient is currently on clindamycin therapy for hidradenitis support above.  Patient with no fevers, chills, nausea, vomiting, diarrhea, constipation.     Physical Exam   Triage Vital Signs: ED Triage Vitals  Enc Vitals Group     BP 04/06/22 1450 138/76     Pulse Rate 04/06/22 1450 100     Resp 04/06/22 1450 18     Temp 04/06/22 1450 98.1 F (36.7 C)     Temp src --      SpO2 04/06/22 1450 95 %     Weight 04/06/22 1450 228 lb (103.4 kg)     Height 04/06/22 1544 5\' 5"  (1.651 m)     Head Circumference --      Peak Flow --      Pain Score 04/06/22 1450 10     Pain Loc --      Pain Edu? --      Excl. in GC? --     Most recent vital signs: Vitals:   04/06/22 1450  BP: 138/76  Pulse: 100  Resp: 18  Temp: 98.1 F (36.7 C)  SpO2: 95%     General: Alert and in no acute distress.  Cardiovascular:  Good peripheral perfusion Respiratory: Normal respiratory effort without tachypnea or retractions. Lungs CTAB. Gastrointestinal: Bowel sounds 4 quadrants. Soft and nontender to palpation. No guarding or rigidity. No palpable masses. No distention.  Positive for right-sided CVA tenderness. Musculoskeletal: Full range of motion to all extremities.  Neurologic:  No gross focal neurologic deficits are appreciated.  Skin:   No rash noted Other:   ED Results / Procedures / Treatments   Labs (all labs ordered are listed, but only abnormal results are displayed) Labs Reviewed  COMPREHENSIVE METABOLIC PANEL - Abnormal; Notable for the following components:      Result Value   Total  Protein 8.4 (*)    All other components within normal limits  CBC WITH DIFFERENTIAL/PLATELET - Abnormal; Notable for the following components:   WBC 12.3 (*)    Platelets 578 (*)    Neutro Abs 8.4 (*)    All other components within normal limits  URINALYSIS, ROUTINE W REFLEX MICROSCOPIC - Abnormal; Notable for the following components:   Color, Urine STRAW (*)    APPearance CLEAR (*)    Hgb urine dipstick LARGE (*)    Leukocytes,Ua SMALL (*)    RBC / HPF >50 (*)    Bacteria, UA RARE (*)    All other components within normal limits  POC URINE PREG, ED     EKG     RADIOLOGY  I personally viewed, evaluated, and interpreted these images as part of my medical decision making, as well as reviewing the written report by the radiologist.  ED Provider Interpretation: Punctate nonobstructing calculus in the left kidney.  Small fat-containing supraumbilical hernia.  Right ovarian cyst.  CT Renal Stone Study  Result Date: 04/06/2022 CLINICAL DATA:  Flank pain, suspected kidney stone EXAM: CT ABDOMEN AND PELVIS WITHOUT CONTRAST TECHNIQUE: Multidetector CT imaging of the abdomen and pelvis was performed following the standard  protocol without IV contrast. RADIATION DOSE REDUCTION: This exam was performed according to the departmental dose-optimization program which includes automated exposure control, adjustment of the mA and/or kV according to patient size and/or use of iterative reconstruction technique. COMPARISON:  10/21/2021 FINDINGS: Lower chest: Lung bases clear Hepatobiliary: Gallbladder surgically absent. Liver normal appearance Pancreas: Normal appearance Spleen: Normal appearance Adrenals/Urinary Tract: Adrenal glands normal appearance. RIGHT kidney normal appearance without mass or calculus. Small cyst lateral mid LEFT kidney 10 mm diameter unchanged. Tiny nonobstructing calculus mid LEFT kidney sagittal image 82. Minimally prominent RIGHT renal pelvis though no obstructing calculus or  ureteral dilatation identified. Bladder and ureters normal appearance. Stomach/Bowel: Appendix not visualized, no pericecal inflammatory process seen. Mild diverticulosis of descending colon without evidence of diverticulitis. Stomach and remaining bowel loops normal appearance. Vascular/Lymphatic: Aorta normal caliber.  No adenopathy. Reproductive: Uterus normal appearance. LEFT ovary normal appearance with resolution of large cyst seen on previous exam. 4.3 x 4.0 x 3.6 cm cyst arising from cranial aspect of RIGHT ovary, previously 4.1 x 3.2 x 4.0 cm; no follow-up imaging recommended. Other: No free air or free fluid. Tiny supraumbilical ventral hernia containing fat. No definite inflammatory process. Musculoskeletal: Unremarkable IMPRESSION: Tiny nonobstructing LEFT renal calculus. Mild diverticulosis of descending colon without evidence of diverticulitis. Stable RIGHT ovarian cyst and resolution of previously seen LEFT ovarian cyst; no follow-up imaging recommended. Tiny supraumbilical ventral hernia containing fat. No acute intra-abdominal or intrapelvic abnormalities. Electronically Signed   By: Lavonia Dana M.D.   On: 04/06/2022 18:35    PROCEDURES:  Critical Care performed: No  Procedures   MEDICATIONS ORDERED IN ED: Medications  aztreonam (AZACTAM) 1 g in sodium chloride 0.9 % 100 mL IVPB (1 g Intravenous New Bag/Given 04/06/22 2028)  oxyCODONE-acetaminophen (PERCOCET/ROXICET) 5-325 MG per tablet 1 tablet (1 tablet Oral Given 04/06/22 1831)  sodium chloride 0.9 % bolus 1,000 mL (1,000 mLs Intravenous New Bag/Given 04/06/22 2027)     IMPRESSION / MDM / ASSESSMENT AND PLAN / ED COURSE  I reviewed the triage vital signs and the nursing notes.                              Differential diagnosis includes, but is not limited to, pyelonephritis, nephrolithiasis, lumbar strain, cholecystitis, colitis   Patient's presentation is most consistent with acute presentation with potential threat to  life or bodily function.   Patient's diagnosis is consistent with pyelonephritis.  Patient presented to the emergency department with back pain worse on the right than left.  Does have a history of nephrolithiasis.  Had a small amount of hematuria, leukocytes and bacteria in the urine.  Positive right-sided CVA tenderness.  CT scan revealed no evidence of nephrolithiasis on the right.  Patient has slight elevation in white blood cells consistent with suspected diagnosis of pyelonephritis.  At this time patient will receive aztreonam as she is allergic to sulfa and penicillins with anaphylaxis.  Patient will have fosfomycin as she is currently on clindamycin for hidradenitis suppurative.  Patient will have prescription for Diflucan after finishing her antibiotics.  Highly recommended probiotics at home.  Return precautions discussed with the patient..  Follow-up primary care.  Patient is given ED precautions to return to the ED for any worsening or new symptoms.        FINAL CLINICAL IMPRESSION(S) / ED DIAGNOSES   Final diagnoses:  Pyelonephritis     Rx / DC Orders   ED Discharge  Orders          Ordered    fosfomycin (MONUROL) 3 g PACK  Every 3 DAYS        04/06/22 1957    HYDROcodone-acetaminophen (NORCO/VICODIN) 5-325 MG tablet  Every 4 hours PRN        04/06/22 1957    phenazopyridine (PYRIDIUM) 100 MG tablet  3 times daily PRN        04/06/22 1957    fluconazole (DIFLUCAN) 150 MG tablet  Daily        04/06/22 2027             Note:  This document was prepared using Dragon voice recognition software and may include unintentional dictation errors.   Lanette Hampshire 04/06/22 2043    Merwyn Katos, MD 04/09/22 [phone]

## 2022-04-06 NOTE — ED Notes (Signed)
ED Provider at bedside. 

## 2022-04-09 ENCOUNTER — Encounter: Payer: Self-pay | Admitting: Family Medicine

## 2022-04-10 ENCOUNTER — Telehealth: Payer: Medicaid Other | Admitting: Family Medicine

## 2022-04-10 DIAGNOSIS — Z09 Encounter for follow-up examination after completed treatment for conditions other than malignant neoplasm: Secondary | ICD-10-CM

## 2022-04-10 DIAGNOSIS — M545 Low back pain, unspecified: Secondary | ICD-10-CM

## 2022-04-10 DIAGNOSIS — N390 Urinary tract infection, site not specified: Secondary | ICD-10-CM

## 2022-04-10 DIAGNOSIS — N12 Tubulo-interstitial nephritis, not specified as acute or chronic: Secondary | ICD-10-CM | POA: Diagnosis not present

## 2022-04-10 MED ORDER — NAPROXEN 500 MG PO TABS: 500.0000 mg | ORAL_TABLET | Freq: Two times a day (BID) | ORAL | 0 refills | Status: DC | PRN

## 2022-04-10 MED ORDER — TRAMADOL HCL 50 MG PO TABS: 50.0000 mg | ORAL_TABLET | Freq: Two times a day (BID) | ORAL | 0 refills | Status: AC | PRN

## 2022-04-10 MED ORDER — ACETAMINOPHEN 500 MG PO TABS: 500.0000 mg | ORAL_TABLET | Freq: Four times a day (QID) | ORAL | 0 refills | Status: DC | PRN

## 2022-04-10 MED ORDER — BACLOFEN 5 MG PO TABS: 5.0000 mg | ORAL_TABLET | Freq: Three times a day (TID) | ORAL | 0 refills | Status: DC | PRN

## 2022-04-10 NOTE — Progress Notes (Signed)
Name: Destiny Flores   MRN: [id number]    DOB: [birth year]   Date:04/10/2022       Progress Note  Subjective:    Chief Complaint  Chief Complaint  Patient presents with   Hospitalization Follow-up    Dx: Pyelonephritis. No vital sign taken at home    I connected with  Georgette Shell  on 04/10/22 at 10:40 AM EDT by a video enabled telemedicine application and verified that I am speaking with the correct person using two identifiers.  I discussed the limitations of evaluation and management by telemedicine and the availability of in person appointments. The patient expressed understanding and agreed to proceed. Staff also discussed with the patient that there may be a patient responsible charge related to this service. Patient Location: home Provider Location: [facility] Additional Individuals present: pts mother is present in her home  HPI ER visit over the weekend dx with pyelo- given IV abx and then fosfomycin to take at home, she continued to have severe pain to low back, using pain meds and following abx instructions. She had nausea only with severe pain, no N/V otherwise, no fever, sweats, chills, bowel changes Pain to low back still moderate to severe, only slight improvement since ED, still bad enough to be unable to move or go to work, pain worse at night, pain at its worst did radiate down legs, but not currently She was trying naproxen and tylenol prior to going to ED and it didn't help so she hadn't tried it since, she got norco and has been taking that and pyridium Hx of kidney stones she has urology f/up and testing this week and soon No prior low back injury or MRI- it does hurt worse with movement, she states not reproducible with palpation of back     Patient Active Problem List   Diagnosis Date Noted   History of kidney stones 10/17/2021   Allergic rhinitis 10/31/2020   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   Thrombocytosis 05/17/2020    Hyperlipidemia 05/17/2020   Gastroesophageal reflux disease without esophagitis 05/17/2020   Chronic sinusitis 05/17/2020   Vitamin D deficiency 03/06/2018   Cerebral palsy ([place]) 12/24/2017   Epilepsy ([place]) 12/24/2017   Hidradenitis suppurativa 12/24/2017   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    Social History   Tobacco Use   Smoking status: Never   Smokeless tobacco: Never  Substance Use Topics   Alcohol use: Never     Current Outpatient Medications:    acetaminophen (TYLENOL) 500 MG tablet, Take 2 tablets (1,000 mg total) by mouth every 6 (six) hours as needed., Disp: 100 tablet, Rfl: 2   Adalimumab (HUMIRA PEN-CD/UC/HS STARTER) 80 MG/0.8ML PNKT, Inject the contents of 1 pen (80mg ) under the skin once weekly., Disp: , Rfl:    ascorbic acid (VITAMIN C) 1000 MG tablet, Take by mouth., Disp: , Rfl:    azelastine (ASTELIN) 0.1 % nasal spray, Place 1-2 sprays into both nostrils 2 (two) times daily. Use in each nostril as directed, Disp: 30 mL, Rfl: 12   Brivaracetam (BRIVIACT) 100 MG TABS, Take 1 tablet by mouth 2 (two) times daily., Disp: , Rfl:    famotidine (PEPCID) 40 MG tablet, Take 0.5 tablets (20 mg total) by mouth 2 (two) times daily as needed for heartburn or indigestion., Disp: 180 tablet, Rfl: 1   felbamate (FELBATOL) 600 MG tablet, Take 600 mg by mouth 3 (three) times daily., Disp: , Rfl:    fluconazole (  DIFLUCAN) 150 MG tablet, Take 1 tablet (150 mg total) by mouth daily. After finishing antibiotics, Disp: 1 tablet, Rfl: 0   fosfomycin (MONUROL) 3 g PACK, Take 3 g by mouth every 3 (three) days for 2 doses., Disp: 6 g, Rfl: 0   HYDROcodone-acetaminophen (NORCO/VICODIN) 5-325 MG tablet, Take 1 tablet by mouth every 4 (four) hours as needed for moderate pain., Disp: 10 tablet, Rfl: 0   levETIRAcetam (KEPPRA) 750 MG tablet, Take 1,500 mg by mouth 2 (two) times daily., Disp: , Rfl:    magnesium oxide (MAG-OX) 400 MG tablet, Take by mouth., Disp: , Rfl:     Melatonin 3 MG CAPS, Take by mouth., Disp: , Rfl:    naproxen (NAPROSYN) 500 MG tablet, Take 1 tablet (500 mg total) by mouth 2 (two) times daily with a meal., Disp: 60 tablet, Rfl: 11   pantoprazole (PROTONIX) 40 MG tablet, TAKE 1 TABLET BY MOUTH TWICE A DAY, Disp: 180 tablet, Rfl: 1   pyridOXINE (VITAMIN B-6) 100 MG tablet, Take 100 mg by mouth daily., Disp: , Rfl:    spironolactone (ALDACTONE) 100 MG tablet, Take by mouth., Disp: , Rfl:    topiramate (TOPAMAX) 25 MG tablet, Take 50 mg by mouth at bedtime., Disp: , Rfl:    TURMERIC CURCUMIN PO, Take 2 capsules by mouth daily., Disp: , Rfl:    SUMAtriptan (IMITREX) 50 MG tablet, Take 50 mg by mouth daily as needed., Disp: , Rfl:   Allergies  Allergen Reactions   Codeine Anaphylaxis   Penicillins Anaphylaxis and Other (See Comments)    Has patient had a PCN reaction causing immediate rash, facial/tongue/throat swelling, SOB or lightheadedness with hypotension: Yes Has patient had a PCN reaction causing severe rash involving mucus membranes or skin necrosis: No Has patient had a PCN reaction that required hospitalization: Yes Has patient had a PCN reaction occurring within the last 10 years: No If all of the above answers are "NO", then may proceed with Cephalosporin use.  Has patient had a PCN reaction causing immediate rash, facial/tongue/throat swelling, SOB or lightheadedness with hypotension: Yes Has patient had a PCN reaction causing severe rash involving mucus membranes or skin necrosis: No Has patient had a PCN reaction that required hospitalization: Yes Has patient had a PCN reaction occurring within the last 10 years: No If all of the above answers are "NO", then may proceed with Cephalosporin use. Has patient had a PCN reaction causing immediate rash, facial/tongue/throat swelling, SOB or lightheadedness with hypotension: Yes Has patient had a PCN reaction causing severe rash involving mucus membranes or skin necrosis: No Has  patient had a PCN reaction that required hospitalization: Yes Has patient had a PCN reaction occurring within the last 10 years: No If all of the above answers are "NO", then may proceed with Cephalosporin use.   Sulfa Antibiotics Hives   Tape Other (See Comments)    Large Boils-PAPER TAPE OK TO USE Large Boils-PAPER TAPE OK TO USE    I personally reviewed active problem list, medication list, allergies, family history, social history, health maintenance, notes from last encounter, lab results, imaging with the patient/caregiver today.   Review of Systems  Constitutional: Negative.   HENT: Negative.    Eyes: Negative.   Respiratory: Negative.    Cardiovascular: Negative.   Gastrointestinal: Negative.   Endocrine: Negative.   Genitourinary: Negative.   Musculoskeletal: Negative.   Skin: Negative.   Allergic/Immunologic: Negative.   Neurological: Negative.   Hematological: Negative.   Psychiatric/Behavioral: Negative.  All other systems reviewed and are negative.     Objective:   Virtual encounter, vitals limited, only able to obtain the following There were no vitals filed for this visit. There is no height or weight on file to calculate BMI. Nursing Note and Vital Signs reviewed.  Physical Exam Vitals reviewed.  Constitutional:      General: She is not in acute distress.    Appearance: She is obese. She is not ill-appearing or diaphoretic.  Pulmonary:     Effort: No respiratory distress.  Neurological:     Mental Status: She is alert.  Psychiatric:        Mood and Affect: Mood normal.     PE limited by virtual encounter  No results found for this or any previous visit (from the past 72 hour(s)).  Assessment and Plan:   1. Recurrent UTI Reviewed ER visit - hematuria and rare bacteria w/o urine culture - would have expected improvement by now - urine culture for C&S - to focus abx tx - Urine Culture  2. Pyelonephritis Continued sx, pain bad enough to  prevent her from going to work Advised she should come in for urine culture since pain not much improved Restart tylenol and NSAIDs prior to any pain med Possible MSK aspect? - traMADol (ULTRAM) 50 MG tablet; Take 1 tablet (50 mg total) by mouth every 12 (twelve) hours as needed for up to 5 days for severe pain.  Dispense: 10 tablet; Refill: 0 - Urine Culture  3. Low back pain, unspecified back pain laterality, unspecified chronicity, unspecified whether sciatica present Not 100% its pyelo - may me MSK with radiation to legs, pain worse with moving, and not much improvement on abx Start tylenol and nsaids, try baclofen esp at night with worse pain at night, pain meds only for very severe pain If urine culture neg or no improvement with abx may need to eval her for low back pain of MSK origin - traMADol (ULTRAM) 50 MG tablet; Take 1 tablet (50 mg total) by mouth every 12 (twelve) hours as needed for up to 5 days for severe pain.  Dispense: 10 tablet; Refill: 0 - Baclofen 5 MG TABS; Take 5-10 mg by mouth 3 (three) times daily as needed (msk pain or spasms).  Dispense: 60 tablet; Refill: 0 - Urine Culture  4. Encounter for examination following treatment at hospital ED visit and results reviewed thoroughly - Urine Culture    -Red flags and when to present for emergency care or RTC including fever >101.56F, chest pain, shortness of breath, new/worsening/un-resolving symptoms, reviewed with patient at time of visit. Follow up and care instructions discussed and provided in AVS. - I discussed the assessment and treatment plan with the patient. The patient was provided an opportunity to ask questions and all were answered. The patient agreed with the plan and demonstrated an understanding of the instructions.  I provided 25+ minutes of non-face-to-face time during this encounter.  Danelle Berry, PA-C 04/10/22 10:50 AM

## 2022-04-11 DIAGNOSIS — N390 Urinary tract infection, site not specified: Secondary | ICD-10-CM | POA: Diagnosis not present

## 2022-04-11 DIAGNOSIS — N12 Tubulo-interstitial nephritis, not specified as acute or chronic: Secondary | ICD-10-CM | POA: Diagnosis not present

## 2022-04-11 DIAGNOSIS — Z09 Encounter for follow-up examination after completed treatment for conditions other than malignant neoplasm: Secondary | ICD-10-CM | POA: Diagnosis not present

## 2022-04-11 DIAGNOSIS — M545 Low back pain, unspecified: Secondary | ICD-10-CM | POA: Diagnosis not present

## 2022-04-13 ENCOUNTER — Other Ambulatory Visit: Payer: Medicaid Other

## 2022-04-13 LAB — URINE CULTURE
MICRO NUMBER:: 14041344
Result:: NO GROWTH
SPECIMEN QUALITY:: ADEQUATE

## 2022-04-18 ENCOUNTER — Other Ambulatory Visit: Payer: Self-pay | Admitting: Urology

## 2022-04-18 ENCOUNTER — Other Ambulatory Visit: Payer: Medicaid Other

## 2022-04-18 DIAGNOSIS — N200 Calculus of kidney: Secondary | ICD-10-CM | POA: Diagnosis not present

## 2022-04-19 LAB — LITHOLINK SERUM PANEL
CO2: 20 mmol/L (ref 20–29)
Calcium: 9.7 mg/dL (ref 8.7–10.2)
Chloride: 102 mmol/L (ref 96–106)
Creatinine, Ser: 0.7 mg/dL (ref 0.57–1.00)
Magnesium: 1.8 mg/dL (ref 1.6–2.3)
Phosphorus: 4 mg/dL (ref 3.0–4.3)
Potassium: 4.5 mmol/L (ref 3.5–5.2)
Sodium: 139 mmol/L (ref 134–144)
Uric Acid: 5.2 mg/dL (ref 2.6–6.2)
eGFR: 111 mL/min/{1.73_m2} (ref >59)

## 2022-04-20 LAB — LITHOLINK SERUM PANEL
CO2: 21 mmol/L (ref 20–29)
Calcium: 9.9 mg/dL (ref 8.7–10.2)
Chloride: 103 mmol/L (ref 96–106)
Creatinine, Ser: 0.69 mg/dL (ref 0.57–1.00)
Magnesium: 1.7 mg/dL (ref 1.6–2.3)
Phosphorus: 4.1 mg/dL (ref 3.0–4.3)
Potassium: 4.6 mmol/L (ref 3.5–5.2)
Sodium: 140 mmol/L (ref 134–144)
Uric Acid: 5.6 mg/dL (ref 2.6–6.2)
eGFR: 111 mL/min/{1.73_m2} (ref >59)

## 2022-04-25 LAB — LITHOLINK 24HR URINE PANEL
Ammonium, Urine: 25 mmol/24 hr (ref 15–60)
Calcium Oxalate Saturation: 3.91 — ABNORMAL LOW (ref 6.00–10.00)
Calcium Phosphate Saturation: 1.77 (ref 0.50–2.00)
Calcium, Urine: 239 mg/24 hr — ABNORMAL HIGH (ref <200)
Calcium/Creatinine Ratio: 182 mg/g creat (ref 51–262)
Calcium/Kg Body Weight: 2.3 mg/24 hr/kg (ref <4.0)
Chloride, Urine: 117 mmol/24 hr (ref 70–250)
Citrate, Urine: 467 mg/24 hr — ABNORMAL LOW (ref >550)
Creatinine, Urine: 1315 mg/24 hr
Creatinine/Kg Body Weight: 12.9 mg/24 hr/kg (ref 8.7–20.3)
Cystine, Urine, Qualitative: NEGATIVE
Magnesium, Urine: 88 mg/24 hr (ref 30–120)
Oxalate, Urine: 22 mg/24 hr (ref 20–40)
Phosphorus, Urine: 628 mg/24 hr (ref 600–1200)
Potassium, Urine: 24 mmol/24 hr (ref 20–100)
Protein Catabolic Rate: 0.6 g/kg/24 hr — ABNORMAL LOW (ref 0.8–1.4)
Sodium, Urine: 145 mmol/24 hr (ref 50–150)
Sulfate, Urine: 16 meq/24 hr — ABNORMAL LOW (ref 20–80)
Urea Nitrogen, Urine: 6.16 g/24 hr (ref 6.00–14.00)
Uric Acid Saturation: 0.1 (ref <1.00)
Uric Acid, Urine: 648 mg/24 hr (ref <750)
Urine Volume (Preserved): 2450 mL/24 hr (ref 500–4000)
pH, 24 hr, Urine: 6.81 — ABNORMAL HIGH (ref 5.800–6.200)

## 2022-04-30 ENCOUNTER — Ambulatory Visit
Admission: EM | Admit: 2022-04-30 | Discharge: 2022-04-30 | Disposition: A | Discharge status: Home / Self Care | Payer: Medicaid Other | Attending: Emergency Medicine | Admitting: Emergency Medicine

## 2022-04-30 DIAGNOSIS — J0100 Acute maxillary sinusitis, unspecified: Secondary | ICD-10-CM | POA: Diagnosis not present

## 2022-04-30 MED ORDER — AZITHROMYCIN 250 MG PO TABS: 250.0000 mg | ORAL_TABLET | Freq: Every day | ORAL | 0 refills | Status: DC

## 2022-04-30 NOTE — ED Triage Notes (Signed)
Patient to Urgent Care with complaints of  productive cough (yellow mucus), congestion, and sneezing. Symptoms started two weeks ago. Denies any known fevers.

## 2022-04-30 NOTE — ED Provider Notes (Signed)
Destiny Flores    CSN: [id number] Arrival date & time: 04/30/22  1548      History   Chief Complaint Chief Complaint  Patient presents with   Cough    HPI Destiny Flores is a 41 y.o. female. Patient presents with 2 week history of congestion, sneezing, sinus drainage, cough productive of yellow phlegm.  She denies fever, chills, rash, chest pain, shortness of breath, vomiting, diarrhea, or other symptoms.  Treatment at home with OTC cough medication.  Her medical history includes cerebral palsy, epilepsy, GERD, chronic sinusitis, allergic rhinitis.  The history is provided by the patient and medical records.    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   Family history of breast cancer    5/23 cancer genetic testing letter sent   GERD (gastroesophageal reflux disease)    Headache    Hidradenitis suppurativa 12/24/2017   History of kidney stones    H/O   History of methicillin resistant staphylococcus aureus (MRSA) 2008   positive from axilla, left   Hx of Kawasaki's disease    Hydradenitis    Seizures ([facility]CC)    LAST SEIZURE 2012   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   battery change in 2019, inserted in 2002    Patient Active Problem List   Diagnosis Date Noted   History of kidney stones 10/17/2021   Allergic rhinitis 10/31/2020   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    AXILLARY HIDRADENITIS EXCISION     MULTIPLE HYDRADENITIS SURGERIES IN [place]   hidradenitis groin Left 04/2017   last surgery done on groin and the symptoms have returned   HYDRADENITIS EXCISION N/A 01/16/2018   Procedure: EXCISION HIDRADENITIS GROIN;  Surgeon: Leafy Ro, MD;  Location: [facility] ORS;  Service: General;  Laterality: N/A;   KNEE SURGERY Left 2010   tendon and meniscus repair   NOSE SURGERY  1998   deviated septum and sinus repair   ROBOTIC ASSISTED LAPAROSCOPIC CHOLECYSTECTOMY N/A 07/24/2018   Procedure: ROBOTIC ASSISTED LAPAROSCOPIC CHOLECYSTECTOMY;  Surgeon: Leafy Ro, MD;  Location: [facility] OR[facility];  Service: General;  Laterality: N/A;   TONSILLECTOMY     TYMPANOSTOMY TUBE PLACEMENT     as a kid   VAGUS NERVE STIMULATOR GENERATOR CHANGE  03/2015    OB History   No obstetric history on file.      Home Medications    Prior to Admission medications   Medication Sig Start Date End Date Taking? Authorizing Provider  azithromycin (ZITHROMAX) 250 MG tablet Take 1 tablet (250 mg total) by mouth daily. Take first 2 tablets together, then 1 every day until finished. 04/30/22  Yes Mickie Bail, NP  acetaminophen (TYLENOL) 500 MG tablet Take 2  tablets (1,000 mg total) by mouth every 6 (six) hours as needed. 12/02/21 12/02/22  Varney Daily, PA  acetaminophen (TYLENOL) 500 MG tablet Take 1-2 tablets (500-1,000 mg total) by mouth every 6 (six) hours as needed for moderate pain. 04/10/22   Danelle Berry, PA-C  Adalimumab (HUMIRA PEN-CD/UC/HS STARTER) 80 MG/0.8ML PNKT Inject the contents of 1 pen (80mg ) under the skin once weekly. 04/01/20   [provider]  ascorbic acid (VITAMIN C) 1000 MG tablet Take by mouth.     [provider]  azelastine (ASTELIN) 0.1 % nasal spray Place 1-2 sprays into both nostrils 2 (two) times daily. Use in each nostril as directed 04/27/21   04/29/21, PA-C  Baclofen 5 MG TABS Take 5-10 mg by mouth 3 (three) times daily as needed (msk pain or spasms). 04/10/22   06/10/22, PA-C  Brivaracetam (BRIVIACT) 100 MG TABS Take 1 tablet by mouth 2 (two) times daily. 11/21/20   [provider]  famotidine (PEPCID) 40 MG tablet Take 0.5 tablets (20 mg total) by mouth 2 (two) times daily as needed for heartburn or indigestion. 03/21/21   03/23/21, PA-C  felbamate (FELBATOL) 600 MG tablet Take 600 mg by mouth 3 (three) times daily.    [provider]  fluconazole (DIFLUCAN) 150 MG tablet Take 1 tablet (150 mg total) by mouth daily. After finishing antibiotics 04/06/22   Cuthriell, 06/06/22, PA-C  HYDROcodone-acetaminophen (NORCO/VICODIN) 5-325 MG tablet Take 1 tablet by mouth every 4 (four) hours as needed for moderate pain. 04/06/22 04/06/23  Cuthriell, 06/06/23, PA-C  levETIRAcetam (KEPPRA) 750 MG tablet Take 1,500 mg by mouth 2 (two) times daily. 05/07/21   [provider]  magnesium oxide (MAG-OX) 400 MG tablet Take by mouth.    [provider]  Melatonin 3 MG CAPS Take by mouth.    [provider]  naproxen (NAPROSYN) 500 MG tablet Take 1 tablet (500 mg total) by mouth 2 (two) times daily with a meal. 12/02/21 12/02/22  02/01/23, PA  naproxen (NAPROSYN) 500 MG tablet Take 1 tablet (500 mg total) by mouth 2 (two) times daily as needed for moderate pain. 04/10/22   06/10/22, PA-C  pantoprazole (PROTONIX) 40 MG tablet TAKE 1 TABLET BY MOUTH TWICE A DAY 12/26/21   12/28/21, PA-C  pyridOXINE (VITAMIN B-6) 100 MG tablet Take 100 mg by mouth daily.    [provider]  spironolactone (ALDACTONE) 100 MG tablet Take by mouth. 12/10/19   [provider]  SUMAtriptan (IMITREX) 50 MG tablet Take 50 mg by mouth  daily as needed. 07/25/20 07/25/21  [provider]  topiramate (TOPAMAX) 25 MG tablet Take 50 mg by mouth at bedtime. 09/25/21   [provider]  TURMERIC CURCUMIN PO Take 2 capsules by mouth daily.    [provider]    Family History Family History  Problem Relation Age of Onset   Hyperlipidemia Father    Epilepsy Brother    ADD / ADHD Brother    Breast cancer Paternal Grandmother 55       Passed away at 53yo   Asthma Mother     Social History Social History   Tobacco Use   Smoking status: Never   Smokeless tobacco: Never  Vaping Use   Vaping Use: Never used  Substance Use Topics   Alcohol use: Never   Drug use: Never     Allergies   Codeine, Penicillins, Sulfa antibiotics, and Tape  Review of Systems Review of Systems  Constitutional:  Negative for chills and fever.  HENT:  Positive for congestion and postnasal drip. Negative for ear pain and sore throat.   Respiratory:  Positive for cough. Negative for shortness of breath.   Cardiovascular:  Negative for chest pain and palpitations.  Gastrointestinal:  Negative for diarrhea and vomiting.  Skin:  Negative for rash.  All other systems reviewed and are negative.    Physical Exam Triage Vital Signs ED Triage Vitals  Enc Vitals Group     BP      Pulse      Resp      Temp      Temp src      SpO2      Weight      Height      Head Circumference      Peak Flow      Pain Score      Pain Loc      Pain Edu?      Excl. in [place]?    No data found.  Updated Vital Signs BP 116/84   Pulse 97   Temp 98.4 F (36.9 C)   Resp 18   Ht 5\' 5"  (1.651 m)   Wt 225 lb (102.1 kg)   LMP 04/01/2022   SpO2 99%   BMI 37.44 kg/m   Visual Acuity Right Eye Distance:   Left Eye Distance:   Bilateral Distance:    Right Eye Near:   Left Eye Near:    Bilateral Near:     Physical Exam Vitals and nursing note reviewed.  Constitutional:      General: She is not in acute distress.     Appearance: Normal appearance. She is well-developed. She is not ill-appearing.  HENT:     Right Ear: Tympanic membrane normal.     Left Ear: Tympanic membrane normal.     Nose: Congestion present.     Mouth/Throat:     Mouth: Mucous membranes are moist.     Pharynx: Oropharynx is clear.  Cardiovascular:     Rate and Rhythm: Normal rate and regular rhythm.     Heart sounds: Normal heart sounds.  Pulmonary:     Effort: Pulmonary effort is normal. No respiratory distress.     Breath sounds: Normal breath sounds.  Musculoskeletal:     Cervical back: Neck supple.  Skin:    General: Skin is warm and dry.  Neurological:     Mental Status: She is alert.  Psychiatric:        Mood and Affect: Mood normal.        Behavior: Behavior normal.      UC Treatments / Results  Labs (all labs ordered are listed, but only abnormal results are displayed) Labs Reviewed - No data to display  EKG   Radiology No results found.  Procedures Procedures (including critical care time)  Medications Ordered in UC Medications - No data to display  Initial Impression / Assessment and Plan / UC Course  I have reviewed the triage vital signs and the nursing notes.  Pertinent labs & imaging results that were available during my care of the patient were reviewed by me and considered in my medical decision making (see chart for details).    Acute sinusitis.  Patient has been symptomatic for 2 weeks.  Treating with Zithromax.  Discussed symptomatic treatment with Tylenol or ibuprofen as needed.  Instructed patient to follow up with her PCP if  her symptoms are not improving.  She agrees to plan of care.    Final Clinical Impressions(s) / UC Diagnoses   Final diagnoses:  Acute non-recurrent maxillary sinusitis     Discharge Instructions      Take the Zithromax as directed.  Follow up with your primary care provider if your symptoms are not improving.        ED Prescriptions      Medication Sig Dispense Auth. Provider   azithromycin (ZITHROMAX) 250 MG tablet Take 1 tablet (250 mg total) by mouth daily. Take first 2 tablets together, then 1 every day until finished. 6 tablet Mickie Bail, NP      PDMP not reviewed this encounter.   Mickie Bail, NP 04/30/22 [phone]

## 2022-04-30 NOTE — Discharge Instructions (Addendum)
Take the Zithromax as directed.  Follow up with your primary care provider if your symptoms are not improving.    

## 2022-05-14 ENCOUNTER — Telehealth: Payer: Self-pay | Admitting: Family Medicine

## 2022-05-14 NOTE — Telephone Encounter (Signed)
LMOM for patient to return call and schedule a follow up for 24 hour urine results.

## 2022-05-14 NOTE — Telephone Encounter (Signed)
-----   Message from Honor Loh, [place] sent at 05/11/2022  3:57 PM EST -----  ----- Message ----- From: Riki Altes, MD Sent: 05/11/2022   3:39 PM EST To: Levada Schilling, CMA  Recommend follow-up appointment to discuss abnormalities noted on 24-hour urine study.

## 2022-05-15 NOTE — Telephone Encounter (Signed)
Patient notified and appointment has been made.  

## 2022-05-16 ENCOUNTER — Ambulatory Visit: Payer: Medicaid Other | Admitting: Urology

## 2022-05-16 ENCOUNTER — Encounter: Payer: Self-pay | Admitting: Urology

## 2022-05-16 VITALS — BP 106/76 | HR 102 | Ht 67.0 in | Wt 222.0 lb

## 2022-05-16 DIAGNOSIS — L732 Hidradenitis suppurativa: Principal | ICD-10-CM

## 2022-05-16 DIAGNOSIS — N200 Calculus of kidney: Secondary | ICD-10-CM

## 2022-05-16 MED ORDER — RIFAMPIN 300 MG CAPSULE
ORAL_CAPSULE | Freq: Two times a day (BID) | ORAL | 1 refills | 30.00000 days | Status: CP
Start: 2022-05-16 — End: 2022-07-15

## 2022-05-16 NOTE — Progress Notes (Signed)
05/16/2022 8:20 PM   [name] 01/18/1981 [id number]  Referring provider: Danelle Berry, PA-C [address] [place],  [place] [address]  Chief Complaint  Patient presents with   Follow-up    HPI: 41 y.o. female presents for follow-up to review her metabolic stone evaluation  Refer to my previous note 11/10/2021 No complaints since her last visit No prior stone analysis Serum studies showed no abnormalities including a normal serum calcium 24-hour urine study showed Volume at 2450 mL.  Mild hypercalciuria at 230 mg per 24 hours however her calcium oxalate supersaturation was low at 3.91.  Mild hypocitraturia at 467 mg per 24 hours.  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   Family history of breast cancer    5/23 cancer genetic testing letter sent   GERD (gastroesophageal reflux disease)    Headache    Hidradenitis suppurativa 12/24/2017   History of kidney stones    H/O   History of methicillin resistant staphylococcus aureus (MRSA) 2008   positive from axilla, left   Hx of Kawasaki's disease    Hydradenitis    Seizures (HCC)    LAST SEIZURE 2012   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    AXILLARY HIDRADENITIS EXCISION     MULTIPLE HY[place]ADENITIS SURGERIES IN [place]   hidradenitis groin Left 04/2017   last surgery done  on groin and the symptoms have returned   HYDRADENITIS EXCISION N/A 01/16/2018   Procedure: EXCISION HIDRADENITIS GROIN;  Surgeon: Leafy Ro, MD;  Location: [facility];  Service: General;  Laterality: N/A;   KNEE SURGERY Left 2010   tendon and meniscus repair   NOSE SURGERY  1998   deviated septum and sinus repair   ROBOTIC ASSISTED LAPAROSCOPIC CHOLECYSTECTOMY N/A 07/24/2018   Procedure: ROBOTIC ASSISTED LAPAROSCOPIC CHOLECYSTECTOMY;  Surgeon: Leafy Ro, MD;  Location: [facility];  Service: General;  Laterality: N/A;   TONSILLECTOMY     TYMPANOSTOMY TUBE PLACEMENT     as a kid   VAGUS NERVE STIMULATOR GENERATOR CHANGE  03/2015    Home Medications:  Allergies as of 05/16/2022       Reactions   Codeine Anaphylaxis   Penicillins Anaphylaxis, Other (See Comments)   Has patient had a PCN reaction causing immediate rash, facial/tongue/throat swelling, SOB or lightheadedness with hypotension: Yes Has patient had a PCN reaction causing severe rash involving mucus membranes or skin necrosis: No Has patient had a PCN reaction that required hospitalization: Yes Has patient had a PCN reaction occurring within the last 10 years: No If all of the above answers are "NO", then may proceed with Cephalosporin use. Has patient had a PCN reaction causing immediate rash, facial/tongue/throat swelling, SOB or lightheadedness with hypotension: Yes Has patient had a PCN reaction causing severe rash involving mucus membranes or skin necrosis: No Has patient had a PCN reaction that required hospitalization: Yes  Has patient had a PCN reaction occurring within the last 10 years: No If all of the above answers are "NO", then may proceed with Cephalosporin use. Has patient had a PCN reaction causing immediate rash, facial/tongue/throat swelling, SOB or lightheadedness with hypotension: Yes Has patient had a PCN reaction causing severe rash involving mucus membranes or skin necrosis: No Has patient had a PCN  reaction that required hospitalization: Yes Has patient had a PCN reaction occurring within the last 10 years: No If all of the above answers are "NO", then may proceed with Cephalosporin use.   Sulfa Antibiotics Hives   Tape Other (See Comments)   Large Boils-PAPER TAPE OK TO USE Large Boils-PAPER TAPE OK TO USE        Medication List        Accurate as of May 16, 2022  8:20 PM. If you have any questions, ask your nurse or doctor.          STOP taking these medications    acetaminophen 500 MG tablet Commonly known as: TYLENOL Stopped by: Riki Altes, MD   azelastine 0.1 % nasal spray Commonly known as: ASTELIN Stopped by: Riki Altes, MD   azithromycin 250 MG tablet Commonly known as: ZITHROMAX Stopped by: Riki Altes, MD   Baclofen 5 MG Tabs Stopped by: Riki Altes, MD   fluconazole 150 MG tablet Commonly known as: Diflucan Stopped by: Riki Altes, MD   Humira Pen-CD/UC/HS Starter 80 MG/0.8ML Pnkt Generic drug: Adalimumab Stopped by: Riki Altes, MD   HYDROcodone-acetaminophen 5-325 MG tablet Commonly known as: NORCO/VICODIN Stopped by: Riki Altes, MD       TAKE these medications    ascorbic acid 1000 MG tablet Commonly known as: VITAMIN C Take by mouth.   Briviact 100 MG Tabs tablet Generic drug: brivaracetam Take 1 tablet by mouth 2 (two) times daily.   famotidine 40 MG tablet Commonly known as: PEPCID Take 0.5 tablets (20 mg total) by mouth 2 (two) times daily as needed for heartburn or indigestion.   felbamate 600 MG tablet Commonly known as: FELBATOL Take 600 mg by mouth 3 (three) times daily.   levETIRAcetam 750 MG tablet Commonly known as: KEPPRA Take 1,500 mg by mouth 2 (two) times daily.   magnesium oxide 400 MG tablet Commonly known as: MAG-OX Take by mouth.   Melatonin 3 MG Caps Take by mouth.   naproxen 500 MG tablet Commonly known as: Naprosyn Take 1 tablet (500 mg total) by mouth 2  (two) times daily with a meal. What changed: Another medication with the same name was removed. Continue taking this medication, and follow the directions you see here. Changed by: Riki Altes, MD   pantoprazole 40 MG tablet Commonly known as: PROTONIX TAKE 1 TABLET BY MOUTH TWICE A DAY   pyridOXINE 100 MG tablet Commonly known as: VITAMIN B6 Take 100 mg by mouth daily.   rifampin 300 MG capsule Commonly known as: RIFADIN Take 300 mg by mouth 2 (two) times daily.   spironolactone 100 MG tablet Commonly known as: ALDACTONE Take by mouth.   SUMAtriptan 50 MG tablet Commonly known as: IMITREX Take 50 mg by mouth daily as needed.   topiramate 25 MG tablet Commonly known as: TOPAMAX Take 50 mg by mouth at bedtime.   TURMERIC CURCUMIN PO Take 2 capsules by mouth daily.        Allergies:  Allergies  Allergen Reactions   Codeine Anaphylaxis  Penicillins Anaphylaxis and Other (See Comments)    Has patient had a PCN reaction causing immediate rash, facial/tongue/throat swelling, SOB or lightheadedness with hypotension: Yes Has patient had a PCN reaction causing severe rash involving mucus membranes or skin necrosis: No Has patient had a PCN reaction that required hospitalization: Yes Has patient had a PCN reaction occurring within the last 10 years: No If all of the above answers are "NO", then may proceed with Cephalosporin use.  Has patient had a PCN reaction causing immediate rash, facial/tongue/throat swelling, SOB or lightheadedness with hypotension: Yes Has patient had a PCN reaction causing severe rash involving mucus membranes or skin necrosis: No Has patient had a PCN reaction that required hospitalization: Yes Has patient had a PCN reaction occurring within the last 10 years: No If all of the above answers are "NO", then may proceed with Cephalosporin use. Has patient had a PCN reaction causing immediate rash, facial/tongue/throat swelling, SOB or  lightheadedness with hypotension: Yes Has patient had a PCN reaction causing severe rash involving mucus membranes or skin necrosis: No Has patient had a PCN reaction that required hospitalization: Yes Has patient had a PCN reaction occurring within the last 10 years: No If all of the above answers are "NO", then may proceed with Cephalosporin use.   Sulfa Antibiotics Hives   Tape Other (See Comments)    Large Boils-PAPER TAPE OK TO USE Large Boils-PAPER TAPE OK TO USE    Family History: Family History  Problem Relation Age of Onset   Hyperlipidemia Father    Epilepsy Brother    ADD / ADHD Brother    Breast cancer Paternal Grandmother 42       Passed away at 44yo   Asthma Mother     Social History:  reports that she has never smoked. She has never used smokeless tobacco. She reports that she does not drink alcohol and does not use drugs.   Physical Exam: BP 106/76   Pulse (!) 102   Ht 5\' 7"  (1.702 m)   Wt 222 lb (100.7 kg)   BMI 34.77 kg/m   Constitutional:  Alert and oriented, No acute distress. HEENT: [place] AT Respiratory: Normal respiratory effort, no increased work of breathing. Psychiatric: Normal mood and affect.   Assessment & Plan:    1.  Recurrent nephrolithiasis Excellent urine volume at 2450 mL and she was encouraged to continue this degree of fluid intake Mild hypercalciuria however her calcium oxalate and calcium phosphate supersaturation's are normal Mild hypocitraturia.  Her urine pH is elevated and would avoid potassium citrate which may worsen her calcium phosphate supersaturation.  Discussed increasing dietary citrate Follow-up visit April 2024 with [facility].  Repeat 24-hour urine study for interval stone growth    May 2024, MD  [facility] [address] [place], [place] [id number]

## 2022-06-02 DIAGNOSIS — L732 Hidradenitis suppurativa: Principal | ICD-10-CM

## 2022-06-02 MED ORDER — HUMIRA(CF) PEN 80 MG/0.8 ML SUBCUTANEOUS KIT
11 refills | 0.00000 days
Start: 2022-06-02 — End: ?

## 2022-06-02 MED ORDER — HYDROCODONE 5 MG-ACETAMINOPHEN 325 MG TABLET
ORAL_TABLET | Freq: Four times a day (QID) | ORAL | 0 refills | 2.00000 days | PRN
Start: 2022-06-02 — End: ?

## 2022-06-03 ENCOUNTER — Encounter: Payer: Self-pay | Admitting: Family Medicine

## 2022-06-04 DIAGNOSIS — L732 Hidradenitis suppurativa: Principal | ICD-10-CM

## 2022-06-04 MED ORDER — HYDROCODONE 5 MG-ACETAMINOPHEN 325 MG TABLET
ORAL_TABLET | Freq: Four times a day (QID) | ORAL | 0 refills | 2.00000 days | Status: CP | PRN
Start: 2022-06-04 — End: ?

## 2022-06-04 MED ORDER — HUMIRA(CF) PEN 80 MG/0.8 ML SUBCUTANEOUS KIT
SUBCUTANEOUS | 11 refills | 0.00000 days | Status: CP
Start: 2022-06-04 — End: ?
  Filled 2022-06-27: qty 4, 28d supply, fill #0
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
## 2022-06-05 ENCOUNTER — Ambulatory Visit: Payer: Medicaid Other | Admitting: Internal Medicine

## 2022-06-05 ENCOUNTER — Encounter: Payer: Self-pay | Admitting: Internal Medicine

## 2022-06-05 VITALS — BP 116/72 | HR 102 | Temp 97.5°F | Resp 18 | Ht 65.0 in | Wt 228.2 lb

## 2022-06-05 DIAGNOSIS — R109 Unspecified abdominal pain: Secondary | ICD-10-CM | POA: Diagnosis not present

## 2022-06-05 DIAGNOSIS — N3000 Acute cystitis without hematuria: Secondary | ICD-10-CM | POA: Diagnosis not present

## 2022-06-05 LAB — POCT URINALYSIS DIPSTICK
Bilirubin, UA: NEGATIVE
Blood, UA: NEGATIVE
Glucose, UA: NEGATIVE
Ketones, UA: NEGATIVE
Nitrite, UA: NEGATIVE
Odor: NORMAL
Protein, UA: NEGATIVE
Spec Grav, UA: 1.02 (ref 1.010–1.025)
Urobilinogen, UA: 0.2 E.U./dL
pH, UA: 5.5 (ref 5.0–8.0)

## 2022-06-05 MED ORDER — FOSFOMYCIN TROMETHAMINE 3 G PO PACK: PACK | ORAL | 0 refills | Status: DC

## 2022-06-06 LAB — URINE CULTURE
MICRO NUMBER:: 14272147
SPECIMEN QUALITY:: ADEQUATE

## 2022-06-07 NOTE — Unmapped (Signed)
12/22 - left VM. Per Dr. Janyth Contes, no need for reload since patient is using escalated dosing. Will plan to schedule maintenance dosing only.    I spoke with patient and she'd like to try for re-loading doses since she's been off therapy > 8 months. Messaged MD for new order if appropriate.     [facility]   Patient Onboarding/Medication Counseling    Ms.Parrilli is a 41 y.o. female with hidradenitis suppurativa who I am counseling today on continuation of therapy.  I am speaking to the patient.    Was a Nurse, learning disability used for this call? No    Verified patient's date of birth / HIPAA.    Specialty medication(s) to be sent: Inflammatory Disorders: Humira      Non-specialty medications/supplies to be sent: Sharps kit      Medications not needed at this time: na       The patient declined counseling on medication administration, missed dose instructions, goals of therapy, side effects and monitoring parameters, warnings and precautions, drug/food interactions, and storage, handling precautions, and disposal because they have taken the medication previously. The information in the declined sections below are for informational purposes only and was not discussed with patient.       Humira (adalimumab)    Medication & Administration     Dosage:  Hidradenitis suppurativa: escalated, off-label dosing: 80 mg once weekly    Lab tests required prior to treatment initiation:  Tuberculosis: Tuberculosis screening resulted in a non-reactive Quantiferon TB Gold assay.  Hepatitis B: Hepatitis B serology studies are complete and non-reactive.    Administration:     Prefilled auto-injector pen  1. Gather all supplies needed for injection on a clean, flat working surface: medication pen removed from packaging, alcohol swab, sharps container, etc.  2. Look at the medication label - look for correct medication, correct dose, and check the expiration date  3. Look at the medication - the liquid visible in the window on the side of the pen device should appear clear and colorless  4. Lay the auto-injector pen on a flat surface and allow it to warm up to room temperature for at least 30-45 minutes  5. Select injection site - you can use the front of your thigh or your belly (but not the area 2 inches around your belly button); if someone else is giving you the injection you can also use your upper arm in the skin covering your triceps muscle  6. Prepare injection site - wash your hands and clean the skin at the injection site with an alcohol swab and let it air dry, do not touch the injection site again before the injection  7. Pull the 2 safety caps straight off - gray/white to uncover the needle cover and the plum cap to uncover the plum activator button, do not remove until immediately prior to injection and do not touch the white needle cover  8. Gently squeeze the area of cleaned skin and hold it firmly to create a firm surface at the selected injection site  9. Put the white needle cover against your skin at the injection site at a 90 degree angle, hold the pen such that you can see the clear medication window  10. Press down and hold the pen firmly against your skin, press the plum activator button to initiate the injection, there will be a click when the injection starts  11. Continue to hold the pen firmly against your skin for  about 10-15 seconds - the window will start to turn solid yellow  12. To verify the injection is complete after 10-15 seconds, look and ensure the window is solid yellow and then pull the pen away from your skin  13. Dispose of the used auto-injector pen immediately in your sharps disposal container the needle will be covered automatically  14. If you see any blood at the injection site, press a cotton ball or gauze on the site and maintain pressure until the bleeding stops, do not rub the injection site    Adherence/Missed dose instructions:  If your injection is given more than 3 days after your scheduled injection date - consult your pharmacist for additional instructions on how to adjust your dosing schedule.    Goals of Therapy     - Reduce the frequency and severity of new lesions  - Minimize pain and suppuration  - Prevent disease progression and limit scarring  - Maintenance of effective psychosocial functioning    Side Effects & Monitoring Parameters     Injection site reaction (redness, irritation, inflammation localized to the site of administration)  Signs of a common cold - minor sore throat, runny or stuffy nose, etc.  Upset stomach  Headache    The following side effects should be reported to the provider:  Signs of a hypersensitivity reaction - rash; hives; itching; red, swollen, blistered, or peeling skin; wheezing; tightness in the chest or throat; difficulty breathing, swallowing, or talking; swelling of the mouth, face, lips, tongue, or throat; etc.  Reduced immune function - report signs of infection such as fever; chills; body aches; very bad sore throat; ear or sinus pain; cough; more sputum or change in color of sputum; pain with passing urine; wound that will not heal, etc.  Also at a slightly higher risk of some malignancies (mainly skin and blood cancers) due to this reduced immune function.  In the case of signs of infection - the patient should hold the next dose of Humira?? and call your primary care provider to ensure adequate medical care.  Treatment may be resumed when infection is treated and patient is asymptomatic.  Changes in skin - a new growth or lump that forms; changes in shape, size, or color of a previous mole or marking  Signs of unexplained bruising or bleeding - throwing up blood or emesis that looks like coffee grounds; black, tarry, or bloody stool; etc.  Signs of new or worsening heart failure - shortness of breath; sudden weight gain; heartbeat that is not normal; swelling in the arms or legs that is new or worse      Contraindications, Warnings, & Precautions Have your bloodwork checked as you have been told by your prescriber  Talk with your doctor if you are pregnant, planning to become pregnant, or breastfeeding  Discuss the possible need for holding your dose(s) of Humira?? when a planned procedure is scheduled with the prescriber as it may delay healing/recovery timeline       Drug/Food Interactions     Medication list reviewed in Epic. The patient was instructed to inform the care team before taking any new medications or supplements. No drug interactions identified.   Talk with you prescriber or pharmacist before receiving any live vaccinations while taking this medication and after you stop taking it    Storage, Handling Precautions, & Disposal     Store this medication in the refrigerator.  Do not freeze  If needed, you may store at room temperature  for up to 14 days  Store in [employer], protected from light  Do not shake  Dispose of used syringes/pens in a sharps disposal container          Current Medications (including OTC/herbals), Comorbidities and Allergies     Current Outpatient Medications   Medication Sig Dispense Refill    adalimumab (HUMIRA,CF, PEN) 80 mg/0.8 mL PnKt Inject the contents of 1 pen (80 mg total) under the skin every seven (7) days. 4 each 11    ascorbic acid, vitamin C, (VITAMIN C) 1000 MG tablet Take 1 tablet (1,000 mg total) by mouth.      brivaracetam (BRIVIACT) 50 mg tablet Take 1 tablet (50 mg total) by mouth.      FELBATOL 600 mg tablet       HYDROcodone-acetaminophen (NORCO) 5-325 mg per tablet Take 1-2 tablets by mouth every six (6) hours as needed for pain. 15 tablet 0    magnesium oxide (MAG-OX) 400 mg (241.3 mg magnesium) tablet Take 1 tablet (400 mg total) by mouth daily.      melatonin 5 mg tablet Take 1.5 tablets (7.5 mg total) by mouth.      montelukast (SINGULAIR) 10 mg tablet Take 1 tablet (10 mg total) by mouth every evening.      pantoprazole (PROTONIX) 40 MG tablet 20 mg.      rifAMPin (RIFADIN) 300 MG capsule Take 1 capsule (300 mg total) by mouth two (2) times a day. Take with clindamycin for 14 days for HS flares 60 capsule 1    sodium chloride (AYR) 0.65 % Drop 1 spray into each nostril.      spironolactone (ALDACTONE) 100 MG tablet Take 2 tablets (200 mg total) by mouth daily. If you develop lightheadedness, decrease to 1.5 tablets daily 180 tablet 3    topiramate (TOPAMAX) 25 MG tablet Take 2 tablets (50 mg total) by mouth.      turmeric, bulk, 95 % Powd Take 2 capsules by mouth.       No current facility-administered medications for this visit.       Allergies   Allergen Reactions    Codeine Anaphylaxis    Penicillins Anaphylaxis and Other (See Comments)     Has patient had a PCN reaction causing immediate rash, facial/tongue/throat swelling, SOB or lightheadedness with hypotension: Yes  Has patient had a PCN reaction causing severe rash involving mucus membranes or skin necrosis: No  Has patient had a PCN reaction that required hospitalization: Yes  Has patient had a PCN reaction occurring within the last 10 years: No  If all of the above answers are NO, then may proceed with Cephalosporin use.  Has patient had a PCN reaction causing immediate rash, facial/tongue/throat swelling, SOB or lightheadedness with hypotension: Yes  Has patient had a PCN reaction causing severe rash involving mucus membranes or skin necrosis: No  Has patient had a PCN reaction that required hospitalization: Yes  Has patient had a PCN reaction occurring within the last 10 years: No  If all of the above answers are NO, then may proceed with Cephalosporin use.      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    Appropriateness of Therapy     Acute infections noted within Epic:  No active infections  Patient reported infection: None    Is medication and dose appropriate based on diagnosis and infection status? Yes    Prescription has been clinically reviewed: Yes      Baseline Quality of Life Assessment      How many days over the past month did your HS  keep you from your normal activities? For example, brushing your teeth or getting up in the morning. Patient declined to answer    Financial Information     Medication Assistance provided: Prior Authorization    Anticipated copay of $4 reviewed with patient. Verified delivery address.    Delivery Information     Scheduled delivery date: 12/27    Expected start date: 12/27    Medication will be delivered via Same Day Courier to the prescription address in [facility].  This shipment will not require a signature.      Explained the services we provide at [facility] and that each month we would call to set up refills.  Stressed importance of returning phone calls so that we could ensure they receive their medications in time each month.  Informed patient that we should be setting up refills 7-10 days prior to when they will run out of medication.  A pharmacist will reach out to perform a clinical assessment periodically.  Informed patient that a welcome packet, containing information about our pharmacy and other support services, a Notice of Privacy Practices, and a drug information handout will be sent.      The patient or caregiver noted above participated in the development of this care plan and knows that they can request review of or adjustments to the care plan at any time.      Patient or caregiver verbalized understanding of the above information as well as how to contact the pharmacy at [phone] option 4 with any questions/concerns.  The pharmacy is open Monday through Friday 8:30am-4:30pm.  A pharmacist is available 24/7 via pager to answer any clinical questions they may have.    Patient Specific Needs     Does the patient have any physical, cognitive, or cultural barriers? No    Does the patient have adequate living arrangements? (i.e. the ability to store and take their medication appropriately) Yes    Did you identify any home environmental safety or security hazards? No    Patient prefers to have medications discussed with  Patient     Is the patient or caregiver able to read and understand education materials at a high school level or above? Yes    Patient's primary language is  English     Is the patient high risk? No    SOCIAL DETERMINANTS OF HEALTH     At the [facility], we have learned that life circumstances - like trouble affording food, housing, utilities, or transportation can affect the health of many of our patients.   That is why we wanted to ask: are you currently experiencing any life circumstances that are negatively impacting your health and/or quality of life? Patient declined to answer    Social Determinants  of Health     Financial Resource Strain: Not on file   Internet Connectivity: Not on file   Food Insecurity: Not on file   Tobacco Use: Low Risk  (04/13/2022)    Patient History     Smoking Tobacco Use: Never     Smokeless Tobacco Use: Never     Passive Exposure: Not on file   Housing/Utilities: Not on file   Alcohol Use: Not on file   Transportation Needs: Not on file   Substance Use: Not on file   Health Literacy: Not on file   Physical Activity: Not on file   Interpersonal Safety: Not on file   Stress: Not on file   Intimate Partner Violence: Not on file   Depression: Not on file   Social Connections: Not on file       Would you be willing to receive help with any of the needs that you have identified today? Not applicable       Nason Conradt A Desiree Lucy Shared [facility] Specialty Pharmacist

## 2022-06-07 NOTE — Unmapped (Signed)
[facility] Specialty Medication Onboarding    Specialty Medication: HUMIRA(CF) PEN 80 mg/0.8 mL Pnkt (adalimumab)  Prior Authorization: Approved   Financial Assistance: No - copay  <$25  Final Copay/Day Supply: $4 / 28    Insurance Restrictions: Yes - max 1 month supply     Notes to Pharmacist: n/a    The triage team has completed the benefits investigation and has determined that the patient is able to fill this medication at [facility]. Please contact the patient to complete the onboarding or follow up with the prescribing physician as needed.

## 2022-06-08 DIAGNOSIS — L732 Hidradenitis suppurativa: Principal | ICD-10-CM

## 2022-06-08 MED ORDER — HYDROCODONE 5 MG-ACETAMINOPHEN 325 MG TABLET
ORAL_TABLET | Freq: Four times a day (QID) | ORAL | 0 refills | 2.00000 days | Status: CP | PRN
Start: 2022-06-08 — End: ?

## 2022-06-18 ENCOUNTER — Telehealth: Payer: Self-pay | Admitting: Urgent Care

## 2022-06-18 ENCOUNTER — Ambulatory Visit: Payer: Medicaid Other

## 2022-06-18 NOTE — Telephone Encounter (Signed)
Contacted patient by telephone regarding her scheduled UC appointment for treatment for HS flare. Patient is already under the care of dermatology with medications prescribed. 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 I informed her that she needs to have the bacteria cultured to determine susceptibility which is not a procedure that we are able to perform in UC.  We will cancel this appointment.

## 2022-06-19 ENCOUNTER — Ambulatory Visit (INDEPENDENT_AMBULATORY_CARE_PROVIDER_SITE_OTHER): Payer: Medicaid Other | Admitting: Family Medicine

## 2022-06-19 ENCOUNTER — Encounter: Payer: Self-pay | Admitting: Family Medicine

## 2022-06-19 VITALS — BP 110/74 | HR 98 | Temp 97.6°F | Resp 16 | Ht 65.0 in | Wt 226.7 lb

## 2022-06-19 DIAGNOSIS — L732 Hidradenitis suppurativa: Secondary | ICD-10-CM

## 2022-06-19 DIAGNOSIS — L0291 Cutaneous abscess, unspecified: Secondary | ICD-10-CM

## 2022-06-19 DIAGNOSIS — R21 Rash and other nonspecific skin eruption: Secondary | ICD-10-CM | POA: Diagnosis not present

## 2022-06-19 MED ORDER — FLUCONAZOLE 150 MG PO TABS: 150.0000 mg | ORAL_TABLET | ORAL | 0 refills | Status: DC | PRN

## 2022-06-19 NOTE — Progress Notes (Signed)
Patient ID: Destiny Flores, female    DOB: [birth year], 41 y.o.   MRN: [id number]  PCP: Delsa Grana, PA-C  Chief Complaint  Patient presents with   Cyst    Lower abdomen pt states it smells bad and unsure if it has pus/infected    Subjective:   Destiny Flores is a 41 y.o. female, presents to clinic with CC of the following:  HPI  HS flare, concern with infections/abscess -she tried to go to urgent care to get a culture but they told her they cannot do that for her she has been trying to get back in with her dermatologist they did send in some pain meds rifampin and clindamycin she has been on the clindamycin she got a 2-week course that she cannot tell with better.  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   Thrombocytosis 05/17/2020   Hyperlipidemia 05/17/2020   Gastroesophageal reflux disease without esophagitis 05/17/2020   Chronic sinusitis 05/17/2020   Vitamin D deficiency 03/06/2018   Cerebral palsy ([place]) 12/24/2017   Epilepsy ([place]) 12/24/2017   Hidradenitis suppurativa 12/24/2017   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      Current Outpatient Medications:    ascorbic acid (VITAMIN C) 1000 MG tablet, Take by mouth., Disp: , Rfl:    Brivaracetam (BRIVIACT) 100 MG TABS, Take 1 tablet by mouth 2 (two) times daily., Disp: , Rfl:    famotidine (PEPCID) 40 MG tablet, Take 0.5 tablets (20 mg total) by mouth 2  (two) times daily as needed for heartburn or indigestion., Disp: 180 tablet, Rfl: 1   felbamate (FELBATOL) 600 MG tablet, Take 600 mg by mouth 3 (three) times daily., Disp: , Rfl:    fosfomycin (MONUROL) 3 g PACK, Take 1 tablet, if symptoms continue take a second dose 72 hours later and can repeat for a total of 3 times., Disp: 3 g, Rfl: 0   levETIRAcetam (KEPPRA) 750 MG tablet, Take 1,500 mg by mouth 2 (two) times daily., Disp: , Rfl:    magnesium oxide (MAG-OX) 400 MG tablet, Take by mouth., Disp: , Rfl:    Melatonin 3 MG CAPS, Take by mouth., Disp: , Rfl:    naproxen (NAPROSYN) 500 MG tablet, Take 1 tablet (500 mg total) by mouth 2 (two) times daily with a meal., Disp: 60 tablet, Rfl: 11   pantoprazole (PROTONIX) 40 MG tablet, TAKE 1 TABLET BY MOUTH TWICE A DAY, Disp: 180 tablet, Rfl: 1   pyridOXINE (VITAMIN B-6) 100 MG tablet, Take 100 mg by mouth daily., Disp: , Rfl:    rifampin (RIFADIN) 300 MG capsule, Take 300 mg by mouth 2 (two) times daily., Disp: , Rfl:    spironolactone (ALDACTONE) 100 MG tablet, Take by mouth., Disp: , Rfl:    topiramate (TOPAMAX) 25 MG tablet, Take 50 mg by mouth at bedtime., Disp: , Rfl:  TURMERIC CURCUMIN PO, Take 2 capsules by mouth daily., Disp: , Rfl:    SUMAtriptan (IMITREX) 50 MG tablet, Take 50 mg by mouth daily as needed., Disp: , Rfl:    Allergies  Allergen Reactions   Codeine Anaphylaxis   Penicillins Anaphylaxis and Other (See Comments)    Has patient had a PCN reaction causing immediate rash, facial/tongue/throat swelling, SOB or lightheadedness with hypotension: Yes Has patient had a PCN reaction causing severe rash involving mucus membranes or skin necrosis: No Has patient had a PCN reaction that required hospitalization: Yes Has patient had a PCN reaction occurring within the last 10 years: No If all of the above answers are "NO", then may proceed with Cephalosporin use.  Has patient had a PCN reaction causing immediate rash,  facial/tongue/throat swelling, SOB or lightheadedness with hypotension: Yes Has patient had a PCN reaction causing severe rash involving mucus membranes or skin necrosis: No Has patient had a PCN reaction that required hospitalization: Yes Has patient had a PCN reaction occurring within the last 10 years: No If all of the above answers are "NO", then may proceed with Cephalosporin use. Has patient had a PCN reaction causing immediate rash, facial/tongue/throat swelling, SOB or lightheadedness with hypotension: Yes Has patient had a PCN reaction causing severe rash involving mucus membranes or skin necrosis: No Has patient had a PCN reaction that required hospitalization: Yes Has patient had a PCN reaction occurring within the last 10 years: No If all of the above answers are "NO", then may proceed with Cephalosporin use.   Sulfa Antibiotics Hives   Tape Other (See Comments)    Large Boils-PAPER TAPE OK TO USE Large Boils-PAPER TAPE OK TO USE     Social History   Tobacco Use   Smoking status: Never   Smokeless tobacco: Never  Vaping Use   Vaping Use: Never used  Substance Use Topics   Alcohol use: Never   Drug use: Never      Chart Review Today: I personally reviewed active problem list, medication list, allergies, family history, social history, health maintenance, notes from last encounter, lab results, imaging with the patient/caregiver today.   Review of Systems  Constitutional: Negative.   HENT: Negative.    Eyes: Negative.   Respiratory: Negative.    Cardiovascular: Negative.   Gastrointestinal: Negative.   Endocrine: Negative.   Genitourinary: Negative.   Musculoskeletal: Negative.   Skin: Negative.   Allergic/Immunologic: Negative.   Neurological: Negative.   Hematological: Negative.   Psychiatric/Behavioral: Negative.    All other systems reviewed and are negative.      Objective:   Vitals:   06/19/22 0830  BP: 110/74  Pulse: 98  Resp: 16  Temp:  97.6 F (36.4 C)  TempSrc: Oral  SpO2: 98%  Weight: 226 lb 11.2 oz (102.8 kg)  Height: 5\' 5"  (1.651 m)    Body mass index is 37.72 kg/m.  Physical Exam Vitals and nursing note reviewed.  Constitutional:      General: She is not in acute distress.    Appearance: Normal appearance. She is obese. She is not ill-appearing, toxic-appearing or diaphoretic.  HENT:     Head: Normocephalic and atraumatic.     Right Ear: External ear normal.     Left Ear: External ear normal.  Skin:    Findings: Abscess, erythema and rash present.     Comments: Right low mons pubis to groin area with extensive scarring, small 2 mm opening with thin purulent draining some signs  of tracking, generally tender to palpation throughout the whole area no noted indurated or edematous skin no fluctuant abscess identified, large area of superficial skin that has confluence erythema and appears somewhat raw Culture of draining fluid obtained   Neurological:     Mental Status: She is alert. Mental status is at baseline.  Psychiatric:        Mood and Affect: Mood normal.        Behavior: Behavior normal.      Results for orders placed or performed in visit on 06/05/22  Urine Culture   Specimen: Urine  Result Value Ref Range   MICRO NUMBER: [id number]    SPECIMEN QUALITY: Adequate    Sample Source URINE    STATUS: FINAL    Result:      Mixed genital flora isolated. These superficial bacteria are not indicative of a urinary tract infection. No further organism identification is warranted on this specimen. If clinically indicated, recollect clean-catch, mid-stream urine and transfer  immediately to Urine Culture Transport Tube.   POCT Urinalysis Dipstick  Result Value Ref Range   Color, UA yellow    Clarity, UA clear    Glucose, UA Negative Negative   Bilirubin, UA neg    Ketones, UA neg    Spec Grav, UA 1.020 1.010 - 1.025   Blood, UA neg    pH, UA 5.5 5.0 - 8.0   Protein, UA Negative Negative    Urobilinogen, UA 0.2 0.2 or 1.0 E.U./dL   Nitrite, UA neg    Leukocytes, UA Moderate (2+) (A) Negative   Appearance clear    Odor normal        Assessment & Plan:     ICD-10-CM   1. Abscess  L02.91 Wound culture   Continued drainage and odor after completing clindamycin, patient wishes for culture -she is hesitant to continue or start another 2 weeks of clindamycin since she did not see much improvement She usually experiences the inflammation and abscess forming and then severe pain until it starts to rupture and drain on its own she states this has been draining about 2 weeks and she has much tenderness and burning on the skin she cannot tell if it is getting better or not    2. Hidradenitis suppurativa  L73.2 Wound culture   She is following up with dermatology trying to get back on Humira, I can see meds on chart try to call again and follow-up    3. Rash and nonspecific skin eruption  R21    In skin folds are a large area of raw and erythematous skin which may be a Candida/intertrigo rash, try to keep dry, oral diflucan, cannot do topical with adjacent open HS  - fluconazole (DIFLUCAN) 150 MG tablet; Take 1 tablet (150 mg total) by mouth every 3 (three) days as needed for up to 10 doses (for vaginal itching/yeast infection sx).  Dispense: 10 tablet; Refill: 0       Will follow culture -  Encouraged patient to call dermatology again in the pharmacy since I can see the Humira prescription sent in over a week ago she just needs to figure out where it went or how to get it She has completed clindamycin and cannot take the rifampin that was prescribed for her because of drug interactions On exam today patient is uncomfortable, I did not identify any drainable abscess, and there was no area of concerning/spreading cellulitis or induration  - it is draining.  Encouraged pt to  continue to monitor and keep in touch with me and derm in case of any worsening  Delsa Grana, PA-C 06/19/22 8:43  AM

## 2022-06-22 LAB — WOUND CULTURE
MICRO NUMBER:: 14338915
SPECIMEN QUALITY:: ADEQUATE

## 2022-06-22 MED ORDER — EMPTY CONTAINER
2 refills | 0 days
Start: 2022-06-22 — End: ?

## 2022-06-27 MED FILL — EMPTY CONTAINER: 120 days supply | Qty: 1 | Fill #0

## 2022-06-29 DIAGNOSIS — L732 Hidradenitis suppurativa: Principal | ICD-10-CM

## 2022-07-03 ENCOUNTER — Ambulatory Visit: Admit: 2022-07-03 | Discharge: 2022-07-03 | Payer: BLUE CROSS/BLUE SHIELD

## 2022-07-03 DIAGNOSIS — Z79899 Other long term (current) drug therapy: Principal | ICD-10-CM

## 2022-07-03 DIAGNOSIS — L732 Hidradenitis suppurativa: Principal | ICD-10-CM

## 2022-07-03 MED ORDER — COSENTYX PEN 150 MG/ML SUBCUTANEOUS
0 refills | 0.00000 days | Status: CN
Start: 2022-07-03 — End: ?

## 2022-07-03 MED ORDER — COSENTYX PEN 300 MG/2 PENS (150 MG/ML) SUBCUTANEOUS
SUBCUTANEOUS | 5 refills | 28.00000 days | Status: CN
Start: 2022-07-03 — End: ?

## 2022-07-03 MED ORDER — METFORMIN 500 MG TABLET
ORAL_TABLET | Freq: Two times a day (BID) | ORAL | 3 refills | 90.00000 days | Status: CP
Start: 2022-07-03 — End: 2023-07-03

## 2022-07-03 NOTE — Unmapped (Deleted)
Dermatology Note     Assessment and Plan:      Benign Lesions/ Findings:   {derm skin check benign A&P:75424}  - Reassurance provided regarding the benign appearance of lesions noted on exam today; no treatment is indicated in the absence of symptoms/changes.  - Reinforced importance of photoprotective strategies including liberal and frequent sunscreen use of a broad-spectrum SPF 30 or greater, use of protective clothing, and sun avoidance for prevention of cutaneous malignancy and photoaging.  Counseled patient on the importance of regular self-skin monitoring as well as routine clinical skin examinations as scheduled.     There are no diagnoses linked to this encounter.    The patient was advised to call for an appointment should any new, changing, or symptomatic lesions develop.     RTC: No follow-ups on file. or sooner as needed   _________________________________________________________________      Chief Complaint     Chief Complaint   Patient presents with    HS     WORSE       HPI     Erica Norman is a 42 y.o. female who presents as a returning patient (last seen 10/02/2021) to Dermatology for follow up of HS. At last visit, patient was continued on humira 80 weekly and clobetasol ointment BID PRN. She was started on spironolactone 200mg  daily and provided clindamycin for flares.    The patient denies any other new or changing lesions or areas of concern.     Pertinent Past Medical History     {derm PMH :75456}    Problem List    None    ***    Family History:   {derm skin check melanoma history:75452}    Past Medical History, Family History, Social History, Medication List, Allergies, and Problem List were reviewed in the rooming section of Epic.     ROS: Other than symptoms mentioned in the HPI, {derm skin check ros:75408::no fevers, chills, or other skin complaints}    Physical Examination     GENERAL: Well-appearing female in no acute distress, resting comfortably.  {PE systems:75418::NEURO: Alert and oriented, answers questions appropriately}  {PE extent:75514}  {PE list:75421}  ***    {PE limitations:75419::All areas not commented on are within normal limits or unremarkable}      (Approved Template 03/14/2020)

## 2022-07-03 NOTE — Unmapped (Signed)
Dermatology Note     Assessment and Plan:      Hidradenitis Suppurativa (HS): [place] Stage 2, chronic, continues to flare around menses, not at patient goal  - Discussed the chronic, relapsing nature of this skin disease, characterized by recurring inflamed painful nodules with abscess and sinus formation, and scarring  - Explained to patient that usually early lesion can remit with medical treatment, but once sinus tracts are established treatment options are limited and surgery is the only definitive treatment; however, recurrence rate can be up to 25% even with surgery  - Patient prefers to avoid infliximab due to side effects  - Discussed unroofing procedure, messaged Rosalita Chessman to schedule  - Discussed metformin, Cosentyx, and Rinvoq per patient request  - Jointly decided to continue Humira 80mg  weekly and start metformin. Will consider switching to Cosentyx in the future pending Humira level and antibody (ordered as below).   - Continue Humira 80 mg weekly. Does not require refills.  - Humira level and antibody ordered (Adalimumab/Adalimumab Ab) given waning response to medication over time  - Start metformin 500 mg BID. Side effects discussed.  - Continue spironolactone (ALDACTONE) 100 MG tablet; Take 2 tablets (200 mg total) by mouth daily. Does not require refills.  - Continue clindamycin (CLEOCIN) 300 MG capsule; Take 1 capsule (300 mg total) by mouth two (2) times a day for 14 days. Take for flares of HS. Take with probiotic. Stop if diarrhea develops. Does not require refills today.  - Note: unable to take rifampin because is interacts with her seizure medications (briviact and felbatol)    High risk medication use, Humira  - Quantiferon gold ordered  - Adalimumab/Adalimumab Ab ordered    The patient was advised to call for an appointment should any new, changing, or symptomatic lesions develop.     RTC: Return in about 4 months (around 11/01/2022) for For HS Unroofing. or sooner as needed _________________________________________________________________      Chief Complaint     Chief Complaint   Patient presents with    HS     WORSE       HPI     Erica Norman is a 42 y.o. female who presents as a returning patient (last seen 10/02/2021) to Dermatology for follow up of HS.     Today patient reports:  - started flaring in the end of August/September in groin area  - had to cancel and August appointment  - flares monthly around her period  - flares in her groin area, currently experiencing a flare  - endorses foul-smelling drainage from her infra pannus   - PCP did a swab of the area approximately one week ago, which revealed normal flora  - PCP gave fluconazole for a suspected yeast infection, which was helpful  - taking Humira 80 mg weekly  - taking spironolactone 200 mg daily  - not using clobetasol 0.05% ointment  - taking clindamycin 300 MG capsule as needed, using approximately once per month around her period  - can't take rifampin because is interacts with seizure medications (briviact and felbatol)  - would like to discuss metformin, Cosentyx, Rinvoq    The patient denies any other new or changing lesions or areas of concern.    Self-reported severity (0-5): 4  VAS pain today: 2  VAS average pain for the last month: 8  Requiring pain medication? Yes.  If so, what type/frequency? Naperson, tylenol,  How often in pain?  few times a week  Level of odor (  0-5): 3  Level of itching (0-5): 0  Dressing changes needed for drainage:Twice a day  How much drainage: a little drainage  Flare in the last month (Y/N)? No.  How long ago was the last flare? in last 6 months  Developing new lesions? less than monthly  Number of inflammatory lesions montly: 3-5  DLQI: 13  Current treatment:      How helpful is the current treatment in managing the following aspects of your disease?  Not at all helpful Somewhat helpful Very helpful   Pain     x   Decreasing length of flares            x     Decreasing new lesions x       Drainage x       Decreasing frequency of flares   x     Decreasing severity of flares   x     Odor x              Pertinent Past Medical History       Problem List       Hidradenitis suppurativa - Primary     Disease course:  Year when symptoms first noticed: Age 31   Year of diagnosis: Age 1  Who diagnosed you? Dermatologist  Location of first symptoms: axillae  Typical involved areas include: groin  Typical number of inflammatory lesions each month at baseline (from first visit): three to five  Disease triggers: stress, sweat and menstrual cycle     Are menstrual cycles irregular when not on birth control? No.  Current form of contraception: None  Effect of hormonal contraception on disease: n/a  Flaring with menstrual cycle (before, during, or after?): during  Difficulty becoming pregnant? n/a  Pregnancy complications? never pregnant    How many children? 0  Better or worse with pregnancy?  never pregnant.  If so, worse during a specific trimester? never pregnant       Prior treatments:  Topical: Clobetasol  Systemic: Keflex, Doxycyline, Humira (helpful in the past), Clindamycin  Past surgical procedures: Yes -bilateral wide excisions of the axilla healed well since 2008 or 2009.  She had more recent excisions in the bilateral inguinal folds in 2016-2018.  These have mostly healed well but she has some persistent areas in particular along the infra abdominal fold and one in the right inguinal fold  Past laser procedures: No            Past Medical History, Family History, Social History, Medication List, Allergies, and Problem List were reviewed in the rooming section of Epic.     ROS: Other than symptoms mentioned in the HPI, no fevers, chills, or other skin complaints    Physical Examination     GENERAL: Well-appearing female in no acute distress, resting comfortably.  NEURO: Alert and oriented, answers questions appropriately  SKIN (Focal Skin Exam): Per patient request, examination of bilateral axillae, infra pannus, groin was performed    - Scarring of bilateral axilla  - Inflammatory nodule of R infra pannus  - Two inflammatory nodules of left inguinal crease   - Bilateral inguinal crease scars    All areas not commented on are within normal limits or unremarkable    (Approved Template 03/14/2020)

## 2022-07-03 NOTE — Unmapped (Signed)
Self-reported severity (0-5): 4  VAS pain today: 2  VAS average pain for the last month: 8  Requiring pain medication? Yes.  If so, what type/frequency? Naperson, tylenol,  How often in pain?  few times a week  Level of odor (0-5): 3  Level of itching (0-5): 0  Dressing changes needed for drainage:Twice a day  How much drainage: a little drainage  Flare in the last month (Y/N)? No.  How long ago was the last flare? in last 6 months  Developing new lesions? less than monthly  Number of inflammatory lesions montly: 3-5  DLQI: 13  Current treatment:      How helpful is the current treatment in managing the following aspects of your disease?  Not at all helpful Somewhat helpful Very helpful   Pain   x   Decreasing length of flares           x    Decreasing new lesions x     Drainage x     Decreasing frequency of flares  x    Decreasing severity of flares  x    Odor x

## 2022-07-04 NOTE — Unmapped (Signed)
I saw and evaluated the patient, participating in the key portions of the service.  I reviewed the resident’s note.  I agree with the resident’s findings and plan. [name] J Horace Wishon, MD

## 2022-07-06 LAB — TB MITOGEN: TB MITOGEN VALUE: 10

## 2022-07-06 LAB — TB AG2: TB AG2 VALUE: 0.02

## 2022-07-06 LAB — QUANTIFERON TB GOLD PLUS
QUANTIFERON ANTIGEN 1 MINUS NIL: 0.01 [IU]/mL
QUANTIFERON ANTIGEN 2 MINUS NIL: 0.01 [IU]/mL
QUANTIFERON MITOGEN: 9.99 [IU]/mL
QUANTIFERON TB GOLD PLUS: NEGATIVE
QUANTIFERON TB NIL VALUE: 0.01 [IU]/mL

## 2022-07-06 LAB — TB NIL: TB NIL VALUE: 0.01

## 2022-07-06 LAB — TB AG1: TB AG1 VALUE: 0.02

## 2022-07-18 ENCOUNTER — Encounter: Payer: Self-pay | Admitting: Family Medicine

## 2022-07-19 NOTE — Unmapped (Signed)
[facility] Refill Coordination Note    Erica Norman, DOB: 08/07/1980  Phone: [phone] (home)       All above HIPAA information was verified with patient.         07/18/2022     6:54 PM   Specialty Rx Medication Refill Questionnaire   Which Medications would you like refilled and shipped? Humira 80mg  i have one more shot that i will take tonight .   Please list all current allergies: Codine PCN Sulfa adhesive   Have you missed any doses in the last 30 days? No   Have you had any changes to your medication(s) since your last refill? No   How many days remaining of each medication do you have at home? 1   If receiving an injectable medication, next injection date is 07/18/2022   Have you experienced any side effects in the last 30 days? No   Please enter the full address (street address, city, state, zip code) where you would like your medication(s) to be delivered to. [id number] [place], [place] [address]   Please specify on which day you would like your medication(s) to arrive. Note: if you need your medication(s) within 3 days, please call the pharmacy to schedule your order at [phone]  07/24/2022   Has your insurance changed since your last refill? No   Would you like a pharmacist to call you to discuss your medication(s)? No   Do you require a signature for your package? (Note: if we are billing Medicare Part B or your order contains a controlled substance, we will require a signature) No         Completed refill call assessment today to schedule patient's medication shipment from the [facility] [phone]).  All relevant notes have been reviewed.       Confirmed patient received a Conservation officer, historic buildings and a Surveyor, mining with first shipment. The patient will receive a drug information handout for each medication shipped and additional FDA Medication Guides as required.         REFERRAL TO PHARMACIST     Referral to the pharmacist: Not needed      [facility]     Shipping address confirmed in Epic.     Delivery Scheduled: Yes, Expected medication delivery date: 07/24/22.     Medication will be delivered via UPS to the prescription address in Epic WAM.    Unk Lightning   [facility] Specialty Technician

## 2022-07-23 MED FILL — HUMIRA(CF) PEN 80 MG/0.8 ML SUBCUTANEOUS KIT: SUBCUTANEOUS | 28 days supply | Qty: 4 | Fill #1

## 2022-07-24 ENCOUNTER — Ambulatory Visit
Admission: EM | Admit: 2022-07-24 | Discharge: 2022-07-24 | Disposition: A | Discharge status: Home / Self Care | Payer: Medicaid Other

## 2022-07-24 DIAGNOSIS — J0100 Acute maxillary sinusitis, unspecified: Secondary | ICD-10-CM | POA: Diagnosis not present

## 2022-07-24 MED ORDER — AZITHROMYCIN 250 MG PO TABS: 250.0000 mg | ORAL_TABLET | Freq: Every day | ORAL | 0 refills | Status: DC

## 2022-07-24 NOTE — Discharge Instructions (Addendum)
Take the Zithromax as directed.  Follow up with your primary care provider if your symptoms are not improving.    

## 2022-07-24 NOTE — ED Provider Notes (Signed)
Roderic Palau    CSN: [id number] Arrival date & time: 07/24/22  1446      History   Chief Complaint Chief Complaint  Patient presents with   Cough   Nasal Congestion    HPI Destiny Flores is a 42 y.o. female.  Patient presents with 1 week history of congestion, sinus pressure, postnasal drip, cough.  No fever, ear pain, sore throat, shortness of breath, chest pain, vomiting, diarrhea, or other symptoms.  No OTC medication taken today.  Her medical history includes chronic sinusitis, cerebral palsy, epilepsy, GERD.   The history is provided by the patient and medical records.    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[place]) 12/24/2017   Family history of breast cancer    5/23 cancer genetic testing letter sent   GERD (gastroesophageal reflux disease)    Headache    Hidradenitis suppurativa 12/24/2017   History of kidney stones    H/O   History of methicillin resistant staphylococcus aureus (MRSA) 2008   positive from axilla, left   Hx of Kawasaki's disease    Hydradenitis    Seizures ([place])    LAST SEIZURE 2012   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   battery change in 2019, inserted in 2002    Patient Active Problem List   Diagnosis Date Noted   History of kidney stones 10/17/2021   Allergic rhinitis 10/31/2020   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    AXILLARY HIDRADENITIS EXCISION     MULTIPLE HYDRADENITIS SURGERIES IN [place]   hidradenitis groin Left 04/2017   last surgery done on groin and the symptoms have returned   HYDRADENITIS EXCISION N/A 01/16/2018   Procedure: EXCISION HIDRADENITIS GROIN;  Surgeon: Jules Husbands, MD;  Location: [facility] ORS;  Service: General;  Laterality: N/A;   KNEE SURGERY Left 2010   tendon and meniscus repair   NOSE SURGERY  1998   deviated septum and sinus repair   ROBOTIC ASSISTED LAPAROSCOPIC CHOLECYSTECTOMY N/A 07/24/2018   Procedure: ROBOTIC ASSISTED LAPAROSCOPIC CHOLECYSTECTOMY;  Surgeon: Jules Husbands, MD;  Location: [facility] ORS;  Service: General;  Laterality: N/A;   TONSILLECTOMY     TYMPANOSTOMY TUBE PLACEMENT     as a kid   VAGUS NERVE STIMULATOR GENERATOR CHANGE  03/2015    OB History   No obstetric history on file.      Home Medications    Prior to Admission medications   Medication Sig Start Date End Date Taking? Authorizing Provider  azithromycin (ZITHROMAX) 250 MG tablet Take 1 tablet (250 mg total) by mouth daily. Take first 2 tablets together, then 1 every day until finished. 07/24/22  Yes Sharion Balloon, NP  metFORMIN (GLUCOPHAGE) 500 MG tablet Take by  mouth. 07/03/22 07/03/23 Yes [provider]  ascorbic acid (VITAMIN C) 1000 MG tablet Take by mouth.    [provider]  Brivaracetam (BRIVIACT) 100 MG TABS Take 1 tablet by mouth 2 (two) times daily. 11/21/20   [provider]  famotidine (PEPCID) 40 MG tablet Take 0.5 tablets (20 mg total) by mouth 2 (two) times daily as needed for heartburn or indigestion. 03/21/21   Danelle Berry, PA-C  felbamate (FELBATOL) 600 MG tablet Take 600 mg by mouth 3 (three) times daily.    [provider]  fluconazole (DIFLUCAN) 150 MG tablet Take 1 tablet (150 mg total) by mouth every 3 (three) days as needed for up to 10 doses (for vaginal itching/yeast infection sx). 06/19/22   Danelle Berry, PA-C  fosfomycin (MONUROL) 3 g PACK Take 1 tablet, if symptoms continue take a second dose 72 hours later and can repeat for a total of 3 times. 06/05/22   Margarita Mail, DO  HUMIRA, 2 PEN, 80 MG/0.8ML PNKT Inject into the skin.    [provider]  levETIRAcetam (KEPPRA) 750 MG tablet Take 1,500 mg by mouth 2 (two) times daily. 05/07/21   [provider]  magnesium oxide (MAG-OX) 400 MG tablet Take by mouth.    [provider]  Melatonin 3 MG CAPS Take by mouth.    [provider]  naproxen (NAPROSYN) 500 MG tablet Take 1 tablet (500 mg total) by mouth 2 (two) times daily with a meal. 12/02/21 12/02/22  Varney Daily, PA  pantoprazole (PROTONIX) 40 MG tablet TAKE 1 TABLET BY MOUTH TWICE A DAY 12/26/21   Danelle Berry, PA-C  pyridOXINE (VITAMIN B-6) 100 MG tablet Take 100 mg by mouth daily.    [provider]  rifampin (RIFADIN) 300 MG capsule Take 300 mg by mouth 2 (two) times daily. 05/16/22   [provider]  spironolactone (ALDACTONE) 100 MG tablet Take by mouth. 12/10/19   [provider]  SUMAtriptan (IMITREX) 50 MG tablet Take 50 mg by mouth daily as needed. 07/25/20 07/25/21  [provider]  topiramate (TOPAMAX) 25 MG tablet Take 50 mg by mouth at bedtime. 09/25/21   [provider]  TURMERIC CURCUMIN PO Take 2 capsules by mouth daily.    [provider]    Family History Family History  Problem Relation Age of Onset   Hyperlipidemia Father    Epilepsy Brother    ADD / ADHD Brother    Breast cancer Paternal Grandmother 77       Passed away at 30yo   Asthma Mother     Social History Social History   Tobacco Use   Smoking status: Never   Smokeless tobacco: Never  Vaping Use   Vaping Use: Never  used  Substance Use Topics   Alcohol use: Never   Drug use: Never     Allergies   Codeine, Penicillins, Sulfa antibiotics, and Tape   Review of Systems Review of Systems  Constitutional:  Negative for chills and fever.  HENT:  Positive for congestion, postnasal drip, rhinorrhea and sinus pressure. Negative for ear pain and sore throat.   Respiratory:  Positive for cough. Negative for shortness of breath.   Cardiovascular:  Negative for chest pain and palpitations.  Gastrointestinal:  Negative for diarrhea and vomiting.  Skin:  Negative for color change and rash.  All other systems reviewed and are negative.    Physical Exam Triage Vital Signs ED Triage Vitals  Enc Vitals Group  BP 07/24/22 1630 116/82     Pulse Rate 07/24/22 1619 83     Resp 07/24/22 1619 18     Temp 07/24/22 1619 97.8 F (36.6 C)     Temp src --      SpO2 07/24/22 1619 99 %     Weight --      Height --      Head Circumference --      Peak Flow --      Pain Score 07/24/22 1627 0     Pain Loc --      Pain Edu? --      Excl. in GC? --    No data found.  Updated Vital Signs BP 116/82   Pulse 83   Temp 97.8 F (36.6 C)   Resp 18   LMP 07/03/2022   SpO2 99%   Visual Acuity Right Eye Distance:   Left Eye Distance:   Bilateral Distance:    Right Eye Near:   Left Eye Near:    Bilateral Near:     Physical Exam Vitals and nursing note reviewed.  Constitutional:      General: She is not in acute distress.    Appearance: She is well-developed. She is not ill-appearing.  HENT:     Right Ear: Tympanic membrane normal.     Left Ear: Tympanic membrane normal.     Nose: Congestion and rhinorrhea present.     Mouth/Throat:     Mouth: Mucous membranes are moist.     Pharynx: Oropharynx is clear.  Cardiovascular:     Rate and Rhythm: Normal rate and regular rhythm.     Heart sounds: Normal heart sounds.  Pulmonary:     Effort: Pulmonary effort is normal. No respiratory distress.      Breath sounds: Normal breath sounds.  Musculoskeletal:     Cervical back: Neck supple.  Skin:    General: Skin is warm and dry.  Neurological:     Mental Status: She is alert.  Psychiatric:        Mood and Affect: Mood normal.        Behavior: Behavior normal.      UC Treatments / Results  Labs (all labs ordered are listed, but only abnormal results are displayed) Labs Reviewed - No data to display  EKG   Radiology No results found.  Procedures Procedures (including critical care time)  Medications Ordered in UC Medications - No data to display  Initial Impression / Assessment and Plan / UC Course  I have reviewed the triage vital signs and the nursing notes.  Pertinent labs & imaging results that were available during my care of the patient were reviewed by me and considered in my medical decision making (see chart for details).    Acute sinusitis.  Patient has been symptomatic for a week and is getting worse.  Treating today with Zithromax as patient reports this has worked well in the past.  Education provided on sinusitis.  Instructed patient to follow up with her PCP if her symptoms are not improving.  She agrees to plan of care.    Final Clinical Impressions(s) / UC Diagnoses   Final diagnoses:  Acute non-recurrent maxillary sinusitis     Discharge Instructions      Take the Zithromax as directed.  Follow up with your primary care provider if your symptoms are not improving.        ED Prescriptions     Medication Sig Dispense Auth.  Provider   azithromycin (ZITHROMAX) 250 MG tablet Take 1 tablet (250 mg total) by mouth daily. Take first 2 tablets together, then 1 every day until finished. 6 tablet Sharion Balloon, NP      PDMP not reviewed this encounter.   Sharion Balloon, NP 07/24/22 [phone]

## 2022-07-24 NOTE — ED Triage Notes (Signed)
Patient to Urgent Care with complaints of nasal congestion and cough. Report sinus pain and pressure, reports feeling constant drainage.  Denies any fevers.   Reports symptoms started last week.  Has been taking benadryl/ tylenol flu and sinus.

## 2022-08-14 DIAGNOSIS — G40219 Localization-related (focal) (partial) symptomatic epilepsy and epileptic syndromes with complex partial seizures, intractable, without status epilepticus: Secondary | ICD-10-CM | POA: Diagnosis not present

## 2022-08-14 DIAGNOSIS — J9601 Acute respiratory failure with hypoxia: Secondary | ICD-10-CM | POA: Diagnosis not present

## 2022-08-14 DIAGNOSIS — R519 Headache, unspecified: Secondary | ICD-10-CM | POA: Diagnosis not present

## 2022-08-14 DIAGNOSIS — Z5181 Encounter for therapeutic drug level monitoring: Secondary | ICD-10-CM | POA: Diagnosis not present

## 2022-08-14 DIAGNOSIS — G40909 Epilepsy, unspecified, not intractable, without status epilepticus: Secondary | ICD-10-CM | POA: Diagnosis not present

## 2022-08-14 DIAGNOSIS — Z9689 Presence of other specified functional implants: Secondary | ICD-10-CM | POA: Diagnosis not present

## 2022-08-21 NOTE — Unmapped (Signed)
[facility] Refill Coordination Note    Erica Norman, DOB: 1980/07/30  Phone: [phone] (home)       All above HIPAA information was verified with patient.         08/19/2022    11:03 PM   Specialty Rx Medication Refill Questionnaire   Which Medications would you like refilled and shipped? Humira and i have enough for two days   Please list all current allergies: Sulfa, codine, PCN and adhesive   Have you missed any doses in the last 30 days? No   Have you had any changes to your medication(s) since your last refill? No   How many days remaining of each medication do you have at home? 2   If receiving an injectable medication, next injection date is 08/22/2022   Have you experienced any side effects in the last 30 days? No   Please enter the full address (street address, city, state, zip code) where you would like your medication(s) to be delivered to. [address], [place], [place] [address]   Please specify on which day you would like your medication(s) to arrive. Note: if you need your medication(s) within 3 days, please call the pharmacy to schedule your order at [phone]  08/22/2022   Has your insurance changed since your last refill? No   Would you like a pharmacist to call you to discuss your medication(s)? No   Do you require a signature for your package? (Note: if we are billing Medicare Part B or your order contains a controlled substance, we will require a signature) No         Completed refill call assessment today to schedule patient's medication shipment from the [facility] [phone]).  All relevant notes have been reviewed.       Confirmed patient received a Conservation officer, historic buildings and a Surveyor, mining with first shipment. The patient will receive a drug information handout for each medication shipped and additional FDA Medication Guides as required.         REFERRAL TO PHARMACIST     Referral to the pharmacist: Not needed      [facility] Shipping address confirmed in Epic.     Delivery Scheduled: Yes, Expected medication delivery date: 08/22/22.     Medication will be delivered via Same Day Courier to the prescription address in Epic WAM.    Arnold Long, PharmD   [facility] Specialty Pharmacist

## 2022-08-22 NOTE — Unmapped (Signed)
Erica Norman 's HUMIRA(CF) PEN 80 mg/0.8 mL Pnkt (adalimumab) shipment will be delayed as a result of a high copay.     I have reached out to the patient  at [phone] and communicated the delay. We will call the patient back to reschedule the delivery upon resolution. We have not confirmed the new delivery date.

## 2022-08-29 DIAGNOSIS — L732 Hidradenitis suppurativa: Principal | ICD-10-CM

## 2022-08-29 MED ORDER — HUMIRA(CF) PEN 80 MG/0.8 ML SUBCUTANEOUS KIT
SUBCUTANEOUS | 11 refills | 28.00000 days | Status: CP
Start: 2022-08-29 — End: ?

## 2022-09-10 ENCOUNTER — Encounter: Payer: Self-pay | Admitting: Family Medicine

## 2022-09-10 ENCOUNTER — Ambulatory Visit: Payer: Medicaid Other | Admitting: Family Medicine

## 2022-09-10 VITALS — BP 124/72 | HR 81 | Temp 98.1°F | Resp 16 | Ht 65.0 in | Wt 218.3 lb

## 2022-09-10 DIAGNOSIS — Z79899 Other long term (current) drug therapy: Secondary | ICD-10-CM

## 2022-09-10 DIAGNOSIS — J329 Chronic sinusitis, unspecified: Secondary | ICD-10-CM

## 2022-09-10 DIAGNOSIS — J029 Acute pharyngitis, unspecified: Secondary | ICD-10-CM

## 2022-09-10 DIAGNOSIS — D84821 Immunodeficiency due to drugs: Secondary | ICD-10-CM

## 2022-09-10 LAB — POCT RAPID STREP A (OFFICE): Rapid Strep A Screen: NEGATIVE

## 2022-09-10 MED ORDER — LEVOCETIRIZINE DIHYDROCHLORIDE 5 MG PO TABS: 5.0000 mg | ORAL_TABLET | Freq: Every evening | ORAL | 0 refills | Status: DC

## 2022-09-10 MED ORDER — AZELASTINE HCL 0.1 % NA SOLN: 2.0000 | Freq: Two times a day (BID) | NASAL | 1 refills | Status: DC | PRN

## 2022-09-10 MED ORDER — CEFDINIR 300 MG PO CAPS: 300.0000 mg | ORAL_CAPSULE | Freq: Two times a day (BID) | ORAL | 0 refills | Status: AC

## 2022-09-10 NOTE — Progress Notes (Signed)
Patient ID: Destiny Flores, female    DOB: 24-Jan-1981, 42 y.o.   MRN: [id number]  PCP: Delsa Grana, PA-C  Chief Complaint  Patient presents with   Sore Throat    X 3 days, pt took COVID TEST at home last night was negative   Nasal Congestion   Cough    Subjective:   Destiny Flores is a 42 y.o. female, presents to clinic with CC of the following:  HPI  URI sx for more than 3 weeks, multiple sick contacts, She she has been having congestion drainage facial and sinus pain and pressure.  She is prone to recurrent sinus infections and has recently resumed Humira medication.  She has not generally ill and today was much worse than she has been in the last 3 weeks so she called in sick to work.  She has seen urgent care a couple times in the last month or 2 and was given a Z-Pak which did not do much to improve her symptoms.  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   Thrombocytosis 05/17/2020   Hyperlipidemia 05/17/2020   Gastroesophageal reflux disease without esophagitis 05/17/2020   Chronic sinusitis 05/17/2020   Vitamin D deficiency 03/06/2018   Cerebral palsy ([place]) 12/24/2017   Epilepsy ([place]) 12/24/2017   Hidradenitis suppurativa 12/24/2017   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      Current Outpatient Medications:    ascorbic acid (VITAMIN C) 1000 MG tablet, Take by mouth., Disp: , Rfl:    Brivaracetam (BRIVIACT) 100 MG TABS, Take 1 tablet by mouth 2 (two) times daily., Disp: , Rfl:    cefdinir (OMNICEF) 300 MG capsule, Take 1 capsule (300 mg total) by mouth 2  (two) times daily for 7 days., Disp: 14 capsule, Rfl: 0   famotidine (PEPCID) 40 MG tablet, Take 0.5 tablets (20 mg total) by mouth 2 (two) times daily as needed for heartburn or indigestion., Disp: 180 tablet, Rfl: 1   felbamate (FELBATOL) 600 MG tablet, Take 600 mg by mouth 3 (three) times daily., Disp: , Rfl:    fluconazole (DIFLUCAN) 150 MG tablet, Take 1 tablet (150 mg total) by mouth every 3 (three) days as needed for up to 10 doses (for vaginal itching/yeast infection sx)., Disp: 10 tablet, Rfl: 0   HUMIRA, 2 PEN, 80 MG/0.8ML PNKT, Inject into the skin., Disp: , Rfl:    levocetirizine (XYZAL) 5 MG tablet, Take 1 tablet (5 mg total) by mouth every evening., Disp: 30 tablet, Rfl: 0   magnesium oxide (MAG-OX) 400 MG tablet, Take by mouth., Disp: , Rfl:    Melatonin 3 MG CAPS, Take by mouth., Disp: , Rfl:    metFORMIN (GLUCOPHAGE) 500 MG tablet, Take by mouth., Disp: , Rfl:    naproxen (NAPROSYN) 500 MG tablet, Take 1 tablet (500 mg total) by mouth 2 (two) times daily with a meal., Disp: 60 tablet, Rfl: 11   pantoprazole (PROTONIX) 40 MG tablet, TAKE 1 TABLET BY MOUTH TWICE A DAY, Disp: 180 tablet, Rfl: 1   pyridOXINE (VITAMIN B-6) 100 MG tablet, Take  100 mg by mouth daily., Disp: , Rfl:    spironolactone (ALDACTONE) 100 MG tablet, Take by mouth., Disp: , Rfl:    topiramate (TOPAMAX) 25 MG tablet, Take 50 mg by mouth at bedtime., Disp: , Rfl:    TURMERIC CURCUMIN PO, Take 2 capsules by mouth daily., Disp: , Rfl:    SUMAtriptan (IMITREX) 50 MG tablet, Take 50 mg by mouth daily as needed., Disp: , Rfl:    Allergies  Allergen Reactions   Codeine Anaphylaxis   Penicillins Anaphylaxis and Other (See Comments)    Has patient had a PCN reaction causing immediate rash, facial/tongue/throat swelling, SOB or lightheadedness with hypotension: Yes Has patient had a PCN reaction causing severe rash involving mucus membranes or skin necrosis: No Has patient had a PCN reaction that required  hospitalization: Yes Has patient had a PCN reaction occurring within the last 10 years: No If all of the above answers are "NO", then may proceed with Cephalosporin use.  Has patient had a PCN reaction causing immediate rash, facial/tongue/throat swelling, SOB or lightheadedness with hypotension: Yes Has patient had a PCN reaction causing severe rash involving mucus membranes or skin necrosis: No Has patient had a PCN reaction that required hospitalization: Yes Has patient had a PCN reaction occurring within the last 10 years: No If all of the above answers are "NO", then may proceed with Cephalosporin use. Has patient had a PCN reaction causing immediate rash, facial/tongue/throat swelling, SOB or lightheadedness with hypotension: Yes Has patient had a PCN reaction causing severe rash involving mucus membranes or skin necrosis: No Has patient had a PCN reaction that required hospitalization: Yes Has patient had a PCN reaction occurring within the last 10 years: No If all of the above answers are "NO", then may proceed with Cephalosporin use.   Sulfa Antibiotics Hives   Tape Other (See Comments)    Large Boils-PAPER TAPE OK TO USE Large Boils-PAPER TAPE OK TO USE     Social History   Tobacco Use   Smoking status: Never   Smokeless tobacco: Never  Vaping Use   Vaping Use: Never used  Substance Use Topics   Alcohol use: Never   Drug use: Never      Chart Review Today: I personally reviewed active problem list, medication list, allergies, family history, social history, health maintenance, notes from last encounter, lab results, imaging with the patient/caregiver today.   Review of Systems  Constitutional: Negative.   HENT: Negative.    Eyes: Negative.   Respiratory: Negative.    Cardiovascular: Negative.   Gastrointestinal: Negative.   Endocrine: Negative.   Genitourinary: Negative.   Musculoskeletal: Negative.   Skin: Negative.   Allergic/Immunologic: Negative.    Neurological: Negative.   Hematological: Negative.   Psychiatric/Behavioral: Negative.    All other systems reviewed and are negative.      Objective:   Vitals:   09/10/22 1317  BP: 124/72  Pulse: 81  Resp: 16  Temp: 98.1 F (36.7 C)  TempSrc: Oral  SpO2: 98%  Weight: 218 lb 4.8 oz (99 kg)  Height: '5\' 5"'$  (1.651 m)    Body mass index is 36.33 kg/m.  Physical Exam Vitals and nursing note reviewed.  Constitutional:      General: She is not in acute distress.    Appearance: Normal appearance. She is well-developed and well-groomed. She is obese. She is not ill-appearing, toxic-appearing or diaphoretic.  HENT:     Head: Normocephalic and atraumatic.     Right Ear:  Hearing, tympanic membrane, ear canal and external ear normal.     Left Ear: Hearing, tympanic membrane, ear canal and external ear normal.     Nose: Mucosal edema, congestion and rhinorrhea present. Rhinorrhea is clear.     Right Nostril: No epistaxis or occlusion.     Left Nostril: No epistaxis.     Right Turbinates: Enlarged and swollen.     Left Turbinates: Enlarged and swollen.     Right Sinus: Maxillary sinus tenderness and frontal sinus tenderness present.     Left Sinus: Maxillary sinus tenderness and frontal sinus tenderness present.     Comments: Worse R nare than L    Mouth/Throat:     Mouth: Mucous membranes are moist. Mucous membranes are not pale.     Pharynx: Oropharynx is clear. Uvula midline. No pharyngeal swelling, oropharyngeal exudate, posterior oropharyngeal erythema or uvula swelling.     Tonsils: No tonsillar exudate or tonsillar abscesses.     Comments: Phonation clear Eyes:     General: No scleral icterus.       Right eye: No discharge.        Left eye: No discharge.     Conjunctiva/sclera: Conjunctivae normal.  Neck:     Trachea: No tracheal deviation.  Cardiovascular:     Rate and Rhythm: Normal rate and regular rhythm.     Pulses: Normal pulses.     Heart sounds: Normal heart  sounds.  Pulmonary:     Effort: Pulmonary effort is normal. No respiratory distress.     Breath sounds: No stridor. No wheezing, rhonchi or rales.  Abdominal:     General: Bowel sounds are normal. There is no distension.     Palpations: Abdomen is soft.  Musculoskeletal:     Cervical back: Normal range of motion.  Lymphadenopathy:     Cervical: No cervical adenopathy.  Skin:    General: Skin is warm and dry.     Coloration: Skin is not pale.     Findings: No rash.  Neurological:     Mental Status: She is alert.     Motor: No abnormal muscle tone.     Coordination: Coordination normal.  Psychiatric:        Mood and Affect: Mood normal.        Behavior: Behavior normal. Behavior is cooperative.      Results for orders placed or performed in visit on 09/10/22  POCT rapid strep A  Result Value Ref Range   Rapid Strep A Screen Negative Negative       Assessment & Plan:     ICD-10-CM   1. Recurrent sinusitis  J32.9 cefdinir (OMNICEF) 300 MG capsule    levocetirizine (XYZAL) 5 MG tablet    azelastine (ASTELIN) 0.1 % nasal spray   Patient with history of sinusitis, at risk with Humira use, 3+ weeks of symptoms and pretty severe tenderness to palpation on exam today, tx antibiotics    2. Sore throat  J02.9 POCT rapid strep A   neg for strep, sx ongoing for 3+ weeks, likely some postnasal drip causing sx - see above    3. Immunosuppression due to drug therapy [facility] Chronic D84.821 cefdinir (OMNICEF) 300 MG capsule   Z79.899      We reviewed her allergy history extensively before prescribing cefdinir today she has previously tolerated cefdinir and cefixime both third-generation cephalosporin medications -encouraged her to use a daily second-generation antihistamine -like Claritin Zyrtec Allegra or Xyzal and to stop Benadryl use.  Encouraged saline nasal sprays and trying a steroid nasal spray other than Flonase such as Nasonex Nasacort -to see if she tolerates this  Work note  given  Encouraged patient to follow-up if not getting better over the next week  Patient was encouraged to come in sooner next time with her immunocompromise state and her history of recurrent sinus infections, if she has rapid worsening of pain tenderness, generalized symptoms and no improvement with over-the-counter medications -I encouraged her to come in or be seen at around 1 week due to her history  She did appear tired and uncomfortable today, but VSS, and she was non-toxic appearing with NAD Will try outpt abx and plan as outlined above   Delsa Grana, PA-C 09/10/22 1:51 PM

## 2022-09-14 DIAGNOSIS — Z03818 Encounter for observation for suspected exposure to other biological agents ruled out: Secondary | ICD-10-CM | POA: Diagnosis not present

## 2022-09-14 DIAGNOSIS — R059 Cough, unspecified: Secondary | ICD-10-CM | POA: Diagnosis not present

## 2022-09-14 DIAGNOSIS — Z6836 Body mass index (BMI) 36.0-36.9, adult: Secondary | ICD-10-CM | POA: Diagnosis not present

## 2022-09-25 ENCOUNTER — Other Ambulatory Visit: Payer: Self-pay | Admitting: Family Medicine

## 2022-09-25 DIAGNOSIS — Z1231 Encounter for screening mammogram for malignant neoplasm of breast: Secondary | ICD-10-CM

## 2022-09-28 ENCOUNTER — Encounter: Payer: Medicaid Other | Admitting: Family Medicine

## 2022-09-30 ENCOUNTER — Emergency Department
Admission: EM | Admit: 2022-09-30 | Discharge: 2022-10-01 | Disposition: A | Discharge status: Home / Self Care | Payer: Medicaid Other | Attending: Emergency Medicine | Admitting: Emergency Medicine

## 2022-09-30 ENCOUNTER — Emergency Department: Payer: Medicaid Other

## 2022-09-30 ENCOUNTER — Other Ambulatory Visit: Payer: Self-pay

## 2022-09-30 DIAGNOSIS — N83201 Unspecified ovarian cyst, right side: Secondary | ICD-10-CM | POA: Insufficient documentation

## 2022-09-30 DIAGNOSIS — N200 Calculus of kidney: Secondary | ICD-10-CM | POA: Diagnosis not present

## 2022-09-30 DIAGNOSIS — R109 Unspecified abdominal pain: Secondary | ICD-10-CM | POA: Diagnosis present

## 2022-09-30 DIAGNOSIS — K529 Noninfective gastroenteritis and colitis, unspecified: Secondary | ICD-10-CM | POA: Diagnosis not present

## 2022-09-30 DIAGNOSIS — N281 Cyst of kidney, acquired: Secondary | ICD-10-CM | POA: Diagnosis not present

## 2022-09-30 DIAGNOSIS — R102 Pelvic and perineal pain: Secondary | ICD-10-CM | POA: Diagnosis not present

## 2022-09-30 LAB — COMPREHENSIVE METABOLIC PANEL
ALT: 21 U/L (ref 0–44)
AST: 28 U/L (ref 15–41)
Albumin: 4 g/dL (ref 3.5–5.0)
Alkaline Phosphatase: 84 U/L (ref 38–126)
Anion gap: 8 (ref 5–15)
BUN: 13 mg/dL (ref 6–20)
CO2: 24 mmol/L (ref 22–32)
Calcium: 8.8 mg/dL — ABNORMAL LOW (ref 8.9–10.3)
Chloride: 103 mmol/L (ref 98–111)
Creatinine, Ser: 0.67 mg/dL (ref 0.44–1.00)
GFR, Estimated: >60 mL/min (ref >60)
Glucose, Bld: 101 mg/dL — ABNORMAL HIGH (ref 70–99)
Potassium: 3.5 mmol/L (ref 3.5–5.1)
Sodium: 135 mmol/L (ref 135–145)
Total Bilirubin: 0.7 mg/dL (ref 0.3–1.2)
Total Protein: 8 g/dL (ref 6.5–8.1)

## 2022-09-30 LAB — CBC WITH DIFFERENTIAL/PLATELET
Abs Immature Granulocytes: 0.04 10*3/uL (ref 0.00–0.07)
Basophils Absolute: 0 10*3/uL (ref 0.0–0.1)
Basophils Relative: 0 %
Eosinophils Absolute: 0.2 10*3/uL (ref 0.0–0.5)
Eosinophils Relative: 2 %
HCT: 42.2 % (ref 36.0–46.0)
Hemoglobin: 14.1 g/dL (ref 12.0–15.0)
Immature Granulocytes: 0 %
Lymphocytes Relative: 8 %
Lymphs Abs: 0.8 10*3/uL (ref 0.7–4.0)
MCH: 30.2 pg (ref 26.0–34.0)
MCHC: 33.4 g/dL (ref 30.0–36.0)
MCV: 90.4 fL (ref 80.0–100.0)
Monocytes Absolute: 0.7 10*3/uL (ref 0.1–1.0)
Monocytes Relative: 7 %
Neutro Abs: 8.4 10*3/uL — ABNORMAL HIGH (ref 1.7–7.7)
Neutrophils Relative %: 83 %
Platelets: 506 10*3/uL — ABNORMAL HIGH (ref 150–400)
RBC: 4.67 MIL/uL (ref 3.87–5.11)
RDW: 12.5 % (ref 11.5–15.5)
WBC: 10.2 10*3/uL (ref 4.0–10.5)
nRBC: 0 % (ref 0.0–0.2)

## 2022-09-30 LAB — URINALYSIS, COMPLETE (UACMP) WITH MICROSCOPIC
Bilirubin Urine: NEGATIVE
Glucose, UA: NEGATIVE mg/dL
Hgb urine dipstick: NEGATIVE
Ketones, ur: 5 mg/dL — AB
Leukocytes,Ua: NEGATIVE
Nitrite: NEGATIVE
Protein, ur: NEGATIVE mg/dL
Specific Gravity, Urine: 1.028 (ref 1.005–1.030)
pH: 5 (ref 5.0–8.0)

## 2022-09-30 LAB — LIPASE, BLOOD: Lipase: 30 U/L (ref 11–51)

## 2022-09-30 LAB — POC URINE PREG, ED: Preg Test, Ur: NEGATIVE

## 2022-09-30 MED ORDER — HYDROCODONE-ACETAMINOPHEN 5-325 MG PO TABS
1.0000 | ORAL_TABLET | Freq: Once | ORAL | Status: AC
  Administered 2022-09-30: 1 via ORAL
  Filled 2022-09-30: qty 1

## 2022-09-30 MED ORDER — SODIUM CHLORIDE 0.9 % IV BOLUS
1000.0000 mL | Freq: Once | INTRAVENOUS | Status: AC
  Administered 2022-09-30: 1000 mL via INTRAVENOUS

## 2022-09-30 MED ORDER — ONDANSETRON 4 MG PO TBDP: 4.0000 mg | ORAL_TABLET | Freq: Three times a day (TID) | ORAL | 0 refills | Status: AC | PRN

## 2022-09-30 MED ORDER — DICYCLOMINE HCL 10 MG PO CAPS: 10.0000 mg | ORAL_CAPSULE | Freq: Three times a day (TID) | ORAL | 0 refills | Status: DC

## 2022-09-30 MED ORDER — METRONIDAZOLE 500 MG PO TABS: 500.0000 mg | ORAL_TABLET | Freq: Two times a day (BID) | ORAL | 0 refills | Status: DC

## 2022-09-30 MED ORDER — ONDANSETRON HCL 4 MG/2ML IJ SOLN
4.0000 mg | Freq: Once | INTRAMUSCULAR | Status: AC
  Administered 2022-09-30: 4 mg via INTRAVENOUS
  Filled 2022-09-30: qty 2

## 2022-09-30 MED ORDER — MORPHINE SULFATE (PF) 4 MG/ML IV SOLN
4.0000 mg | Freq: Once | INTRAVENOUS | Status: AC
  Administered 2022-09-30: 4 mg via INTRAVENOUS
  Filled 2022-09-30: qty 1

## 2022-09-30 MED ORDER — HYDROCODONE-ACETAMINOPHEN 5-325 MG PO TABS: 1.0000 | ORAL_TABLET | ORAL | 0 refills | Status: AC | PRN

## 2022-09-30 MED ORDER — LOPERAMIDE HCL 2 MG PO TABS: 2.0000 mg | ORAL_TABLET | Freq: Four times a day (QID) | ORAL | 0 refills | Status: DC | PRN

## 2022-09-30 NOTE — ED Provider Notes (Signed)
[facility] Provider Note  Patient Contact: 9:11 PM (approximate)   History   Abdominal Pain and Nausea   HPI  Destiny Flores is a 42 y.o. female who presents the emergency department complaining of sharp pain radiating from her right flank to her right abdomen.  She does not have a history of kidney stones, however states that this feels different than her typical kidney stone.  Does not have a gallbladder but does have her appendix.  She does not have any fevers, no anorexia.  She has been nauseated and having diarrhea as well.  No blood in her diarrhea.  Patient denies any dysuria, polyuria, hematuria.  No vaginal bleeding or discharge.  Denies chance of pregnancy.     Physical Exam   Triage Vital Signs: ED Triage Vitals  Enc Vitals Group     BP 09/30/22 2015 (!) 120/98     Pulse Rate 09/30/22 2015 (!) 115     Resp 09/30/22 2015 18     Temp 09/30/22 2015 98.2 F (36.8 C)     Temp Source 09/30/22 2015 Oral     SpO2 09/30/22 2015 100 %     Weight --      Height --      Head Circumference --      Peak Flow --      Pain Score 09/30/22 2016 8     Pain Loc --      Pain Edu? --      Excl. in [place]? --     Most recent vital signs: Vitals:   09/30/22 2015  BP: (!) 120/98  Pulse: (!) 115  Resp: 18  Temp: 98.2 F (36.8 C)  SpO2: 100%     General: Alert and in no acute distress.  Cardiovascular:  Good peripheral perfusion Respiratory: Normal respiratory effort without tachypnea or retractions. Lungs CTAB.  Gastrointestinal: Bowel sounds 4 quadrants.  Soft to palpation all quadrants.  Patient is tender diffusely over the abdomen in all quadrants but worse in the right abdomen.   No guarding or rigidity. No palpable masses. No distention.  Right-sided CVA tenderness. Musculoskeletal: Full range of motion to all extremities.  Neurologic:  No gross focal neurologic deficits are appreciated.  Skin:   No rash noted Other:   ED Results / Procedures /  Treatments   Labs (all labs ordered are listed, but only abnormal results are displayed) Labs Reviewed  CBC WITH DIFFERENTIAL/PLATELET - Abnormal; Notable for the following components:      Result Value   Platelets 506 (*)    Neutro Abs 8.4 (*)    All other components within normal limits  COMPREHENSIVE METABOLIC PANEL - Abnormal; Notable for the following components:   Glucose, Bld 101 (*)    Calcium 8.8 (*)    All other components within normal limits  URINALYSIS, COMPLETE (UACMP) WITH MICROSCOPIC - Abnormal; Notable for the following components:   Color, Urine YELLOW (*)    APPearance HAZY (*)    Ketones, ur 5 (*)    All other components within normal limits  LIPASE, BLOOD  POC URINE PREG, ED     EKG     RADIOLOGY  I personally viewed, evaluated, and interpreted these images as part of my medical decision making, as well as reviewing the written report by the radiologist.  ED Provider Interpretation: Patient with findings consistent with enterocolitis on CT scan.  Nephrolithiasis that is not transitioning to the ureter also identified.  No  evidence of appendicitis.  There is a 4.7 cm cyst to the right ovary.  Given the size of the cyst will order ultrasound to ensure no evidence of torsion.  Ultrasound reveals no evidence of torsion and simple cyst. US PELVIC COMPLETE W TRANSVAGINAL AND TORSION R/O  Result Date: 09/30/2022 CLINICAL DATA:  Pelvic pain. EXAM: TRANSABDOMINAL AND TRANSVAGINAL ULTRASOUND OF PELVIS DOPPLER ULTRASOUND OF OVARIES TECHNIQUE: Both transabdominal and transvaginal ultrasound examinations of the pelvis were performed. Transabdominal technique was performed for global imaging of the pelvis including uterus, ovaries, adnexal regions, and pelvic cul-de-sac. It was necessary to proceed with endovaginal exam following the transabdominal exam to visualize the uterus, endometrium, bilateral ovaries and bilateral adnexa. Color and duplex Doppler ultrasound was  utilized to evaluate blood flow to the ovaries. COMPARISON:  Nov 24, 2021 FINDINGS: Uterus Measurements: 8.1 cm x 4.3 cm x 5.0 cm = volume: 92.4 mL. No fibroids or other mass visualized. Endometrium Thickness: 13.7 mm.  No focal abnormality visualized. Right ovary Measurements: 5.3 cm x 4.2 cm x 5.6 cm = volume: 64.9 mL. A 4.2 cm x 4.5 cm x 3.5 cm simple right ovarian cyst is seen. Left ovary Measurements: 4.0 cm x 2.4 cm x 2.7 cm = volume: 13.6 mL. Normal appearance/no adnexal mass. Pulsed Doppler evaluation of both ovaries demonstrates normal low-resistance arterial and venous waveforms. Other findings No abnormal free fluid. IMPRESSION: Large, simple right ovarian cyst. Electronically Signed   By: Virgina Norfolk M.D.   On: 09/30/2022 23:09   CT Renal Stone Study  Result Date: 09/30/2022 CLINICAL DATA:  Right abdominal and flank pain, nausea EXAM: CT ABDOMEN AND PELVIS WITHOUT CONTRAST TECHNIQUE: Multidetector CT imaging of the abdomen and pelvis was performed following the standard protocol without IV contrast. RADIATION DOSE REDUCTION: This exam was performed according to the departmental dose-optimization program which includes automated exposure control, adjustment of the mA and/or kV according to patient size and/or use of iterative reconstruction technique. COMPARISON:  04/06/2022 FINDINGS: Lower chest: No acute abnormality. Hepatobiliary: No focal liver abnormality is seen. Status post cholecystectomy. No biliary dilatation. Pancreas: Unremarkable Spleen: Unremarkable Adrenals/Urinary Tract: The adrenal glands are unremarkable. The kidneys are normal in size and position. Exophytic simple cyst arising from the interpolar region of the left kidney is better visualized on prior examination and is unchanged. 2 mm stable nonobstructing calculus noted within the interpolar region of the left kidney. No ureteral calculi. No hydronephrosis. No perinephric inflammatory stranding or fluid collections are  identified. The bladder is unremarkable. Stomach/Bowel: The stomach and small bowel are unremarkable. Appendix normal. The colon is diffusely fluid-filled, a nonspecific finding that be seen in the setting of enterocolitis or malabsorption and may present clinically as diarrhea. No superimposed pericolonic inflammatory stranding, mesenteric edema, or free intraperitoneal gas or fluid. No evidence of obstruction. Appendix normal. Vascular/Lymphatic: No significant vascular findings are present. No enlarged abdominal or pelvic lymph nodes. Reproductive: Slight interval increase in size of simple appearing cyst within the right ovary measuring 3.6 x 3.9 x 4.7 cm in size, likely representing a follicular cyst. No follow-up imaging is recommended in a patient of this age. The pelvic organs are otherwise unremarkable. Other: No abdominal wall hernia Musculoskeletal: No acute or significant osseous findings. IMPRESSION: 1. The colon is diffusely fluid-filled, a nonspecific finding that be seen in the setting of enterocolitis or malabsorption and may present clinically as diarrhea. No superimposed inflammatory change identified. 2. Minimal left nonobstructing nephrolithiasis. No urolithiasis. No hydronephrosis. 3. 4.7 cm right ovarian  simple-appearing cyst, likely a follicular cyst. No follow-up imaging recommended. Electronically Signed   By: Fidela Salisbury M.D.   On: 09/30/2022 21:37    PROCEDURES:  Critical Care performed: No  Procedures   MEDICATIONS ORDERED IN ED: Medications  HYDROcodone-acetaminophen (NORCO/VICODIN) 5-325 MG per tablet 1 tablet (has no administration in time range)  sodium chloride 0.9 % bolus 1,000 mL (1,000 mLs Intravenous New Bag/Given 09/30/22 2213)  morphine (PF) 4 MG/ML injection 4 mg (4 mg Intravenous Given 09/30/22 2213)  ondansetron (ZOFRAN) injection 4 mg (4 mg Intravenous Given 09/30/22 2212)     IMPRESSION / MDM / ASSESSMENT AND PLAN / ED COURSE  I reviewed the triage  vital signs and the nursing notes.                                 Differential diagnosis includes, but is not limited to, appendicitis, ovarian cyst, ovarian torsion, colitis, nephrolithiasis, UTI, pyelonephritis, pregnancy, ectopic pregnancy   Patient's presentation is most consistent with acute presentation with potential threat to life or bodily function.   Patient's diagnosis is consistent with enterocolitis, ovarian cyst.  Patient presents emergency department with right abdominal and right flank pain.  Patient has symptoms with nausea and diarrhea as well.  Does not have her gallbladder but still has her appendix.  Has a history of nephrolithiasis but states that this feels different.  Labs are overall reassuring.  Imaging reveals enterocolitis on CT and ovarian cyst measuring 4.7 cm.  Given the size the patient's pain location I did order ultrasound to ensure no evidence of torsion.  Ultrasound is reassuring with simple cyst with no evidence of torsion.  As such we will treat enterocolitis with antibiotic, Bentyl, loperamide and Zofran.  Limited amount of pain medication for her cyst.  Follow-up with OB/GYN or primary care as needed.  Return precautions discussed with the patient.. Patient is given ED precautions to return to the ED for any worsening or new symptoms.     FINAL CLINICAL IMPRESSION(S) / ED DIAGNOSES   Final diagnoses:  Cyst of right ovary  Enterocolitis     Rx / DC Orders   ED Discharge Orders          Ordered    metroNIDAZOLE (FLAGYL) 500 MG tablet  2 times daily        09/30/22 2324    loperamide (IMODIUM A-D) 2 MG tablet  4 times daily PRN        09/30/22 2324    dicyclomine (BENTYL) 10 MG capsule  3 times daily before meals & bedtime        09/30/22 2324    ondansetron (ZOFRAN-ODT) 4 MG disintegrating tablet  Every 8 hours PRN        09/30/22 2324    HYDROcodone-acetaminophen (NORCO/VICODIN) 5-325 MG tablet  Every 4 hours PRN        09/30/22 2324              Note:  This document was prepared using Dragon voice recognition software and may include unintentional dictation errors.   Brynda Peon 09/30/22 2326    Blake Divine, MD 10/02/22 [phone]

## 2022-09-30 NOTE — ED Triage Notes (Signed)
Pt c/o abd pain and sts that it started last night. Sts the pain is on the R radiating towards the flank area. Pt denies vomiting but sts she is nauseas. Pt appears in no obvious distress at this time.

## 2022-10-02 ENCOUNTER — Other Ambulatory Visit: Payer: Self-pay | Admitting: Family Medicine

## 2022-10-02 DIAGNOSIS — J329 Chronic sinusitis, unspecified: Secondary | ICD-10-CM

## 2022-10-03 ENCOUNTER — Ambulatory Visit: Payer: Self-pay

## 2022-10-03 ENCOUNTER — Encounter: Payer: Self-pay | Admitting: Family Medicine

## 2022-10-03 NOTE — Progress Notes (Unsigned)
LMP 09/19/2022 (Approximate)    Subjective:    Patient ID: Destiny Flores, female    DOB: Jun 09, 1981, 42 y.o.   MRN: [id number]  HPI: Destiny Flores is a 42 y.o. female  No chief complaint on file.  ER follow up/ovarian cyst/enterocolitis: seen on 09/30/2022 at [facility] for abdominal pain. She had labs, ultrasound and ct scan done. Results below. She was discharged with zofran and norco for pain.  ER note : Destiny Flores is a 42 y.o. female who presents the emergency department complaining of sharp pain radiating from her right flank to her right abdomen.  She does not have a history of kidney stones, however states that this feels different than her typical kidney stone.  Does not have a gallbladder but does have her appendix.  She does not have any fevers, no anorexia.  She has been nauseated and having diarrhea as well.  No blood in her diarrhea.  Patient denies any dysuria, polyuria, hematuria.  No vaginal bleeding or discharge.  Denies chance of pregnancy.   Ct abdomen: 1. The colon is diffusely fluid-filled, a nonspecific finding that be seen in the setting of enterocolitis or malabsorption and may present clinically as diarrhea. No superimposed inflammatory change identified. 2. Minimal left nonobstructing nephrolithiasis. No urolithiasis. No hydronephrosis. 3. 4.7 cm right ovarian simple-appearing cyst, likely a follicular cyst. No follow-up imaging recommended.  Pelvic ultrasound: Large, simple right ovarian cyst.   Patient reports today ***  Relevant past medical, surgical, family and social history reviewed and updated as indicated. Interim medical history since our last visit reviewed. Allergies and medications reviewed and updated.  Review of Systems  Constitutional: Negative for fever or weight change.  Respiratory: Negative for cough and shortness of breath.   Cardiovascular: Negative for chest pain or palpitations.  Gastrointestinal: Negative for abdominal pain, no bowel  changes.  Musculoskeletal: Negative for gait problem or joint swelling.  Skin: Negative for rash.  Neurological: Negative for dizziness or headache.  No other specific complaints in a complete review of systems (except as listed in HPI above).      Objective:    LMP 09/19/2022 (Approximate)   Wt Readings from Last 3 Encounters:  09/10/22 218 lb 4.8 oz (99 kg)  06/19/22 226 lb 11.2 oz (102.8 kg)  06/05/22 228 lb 3.2 oz (103.5 kg)    Physical Exam  Constitutional: Patient appears well-developed and well-nourished. Obese *** No distress.  HEENT: head atraumatic, normocephalic, pupils equal and reactive to light, ears ***, neck supple, throat within normal limits Cardiovascular: Normal rate, regular rhythm and normal heart sounds.  No murmur heard. No BLE edema. Pulmonary/Chest: Effort normal and breath sounds normal. No respiratory distress. Abdominal: Soft.  There is no tenderness. Psychiatric: Patient has a normal mood and affect. behavior is normal. Judgment and thought content normal.  Results for orders placed or performed during the hospital encounter of 09/30/22  CBC with Differential  Result Value Ref Range   WBC 10.2 4.0 - 10.5 K/uL   RBC 4.67 3.87 - 5.11 MIL/uL   Hemoglobin 14.1 12.0 - 15.0 g/dL   HCT 42.2 36.0 - 46.0 %   MCV 90.4 80.0 - 100.0 fL   MCH 30.2 26.0 - 34.0 pg   MCHC 33.4 30.0 - 36.0 g/dL   RDW 12.5 11.5 - 15.5 %   Platelets 506 (H) 150 - 400 K/uL   nRBC 0.0 0.0 - 0.2 %   Neutrophils Relative % 83 %   Neutro Abs 8.4 (H)  1.7 - 7.7 K/uL   Lymphocytes Relative 8 %   Lymphs Abs 0.8 0.7 - 4.0 K/uL   Monocytes Relative 7 %   Monocytes Absolute 0.7 0.1 - 1.0 K/uL   Eosinophils Relative 2 %   Eosinophils Absolute 0.2 0.0 - 0.5 K/uL   Basophils Relative 0 %   Basophils Absolute 0.0 0.0 - 0.1 K/uL   Immature Granulocytes 0 %   Abs Immature Granulocytes 0.04 0.00 - 0.07 K/uL  Comprehensive metabolic panel  Result Value Ref Range   Sodium 135 135 - 145 mmol/L    Potassium 3.5 3.5 - 5.1 mmol/L   Chloride 103 98 - 111 mmol/L   CO2 24 22 - 32 mmol/L   Glucose, Bld 101 (H) 70 - 99 mg/dL   BUN 13 6 - 20 mg/dL   Creatinine, Ser 0.67 0.44 - 1.00 mg/dL   Calcium 8.8 (L) 8.9 - 10.3 mg/dL   Total Protein 8.0 6.5 - 8.1 g/dL   Albumin 4.0 3.5 - 5.0 g/dL   AST 28 15 - 41 U/L   ALT 21 0 - 44 U/L   Alkaline Phosphatase 84 38 - 126 U/L   Total Bilirubin 0.7 0.3 - 1.2 mg/dL   GFR, Estimated >60 >60 mL/min   Anion gap 8 5 - 15  Lipase, blood  Result Value Ref Range   Lipase 30 11 - 51 U/L  Urinalysis, Complete w Microscopic -Urine, Clean Catch  Result Value Ref Range   Color, Urine YELLOW (A) YELLOW   APPearance HAZY (A) CLEAR   Specific Gravity, Urine 1.028 1.005 - 1.030   pH 5.0 5.0 - 8.0   Glucose, UA NEGATIVE NEGATIVE mg/dL   Hgb urine dipstick NEGATIVE NEGATIVE   Bilirubin Urine NEGATIVE NEGATIVE   Ketones, ur 5 (A) NEGATIVE mg/dL   Protein, ur NEGATIVE NEGATIVE mg/dL   Nitrite NEGATIVE NEGATIVE   Leukocytes,Ua NEGATIVE NEGATIVE  POC Urine Pregnancy, ED  Result Value Ref Range   Preg Test, Ur Negative Negative      Assessment & Plan:   Problem List Items Addressed This Visit   None    Follow up plan: No follow-ups on file.

## 2022-10-03 NOTE — Telephone Encounter (Signed)
  Chief Complaint: abdominal pain Symptoms: abdominal pain and nausea Frequency: Sunday Pertinent Negatives: Patient denies vomiting Disposition: [] ED /[] Urgent Care (no appt availability in office) / [x] Appointment(In office/virtual)/ []  [facility]/ [] Home Care/ [] Refused Recommended Disposition /[] [facility]/ []  Follow-up with PCP Additional Notes: pt went to ED on 09/30/22 was dx with Enterocolitis and has been taking the meds prescribed just not taking the Zofran as often. Scheduled OV tomorrow at [place] with Almyra Free, NP d/t first available. Advised pt to take meds as prescribed from ED until can be seen and can take Tylenol or Ibuprofen as needed as well for pain. Pt verbalized understanding.   Summary: stomach discomfort   The patient has been previously seen at [facility] and treated for eterocolitius  The patient shares that they are continuing to experience stomach discomfort  The patient shares that they have continued to experience slight nausea but no vomiting         Reason for Disposition  [1] MODERATE pain (e.g., interferes with normal activities) AND [2] pain comes and goes (cramps) AND [3] present > 24 hours  (Exception: Pain with Vomiting or Diarrhea - see that Guideline.)  Answer Assessment - Initial Assessment Questions 1. LOCATION: "Where does it hurt?"      Center of abdomen  3. ONSET: "When did the pain begin?" (e.g., minutes, hours or days ago)      Sunday 5. PATTERN "Does the pain come and go, or is it constant?"    - If it comes and goes: "How long does it last?" "Do you have pain now?"     (Note: Comes and goes means the pain is intermittent. It goes away completely between bouts.)    - If constant: "Is it getting better, staying the same, or getting worse?"      (Note: Constant means the pain never goes away completely; most serious pain is constant and gets worse.)      Comes and goes  6. SEVERITY: "How bad is the pain?"  (e.g., Scale 1-10;  mild, moderate, or severe)    - MILD (1-3): Doesn't interfere with normal activities, abdomen soft and not tender to touch.     - MODERATE (4-7): Interferes with normal activities or awakens from sleep, abdomen tender to touch.     - SEVERE (8-10): Excruciating pain, doubled over, unable to do any normal activities.       8/10 8. CAUSE: "What do you think is causing the stomach pain?"     Enterocolitis  10. OTHER SYMPTOMS: "Do you have any other symptoms?" (e.g., back pain, diarrhea, fever, urination pain, vomiting)       Nausea  Protocols used: Abdominal Pain - Female-A-AH

## 2022-10-04 ENCOUNTER — Encounter: Payer: Self-pay | Admitting: Nurse Practitioner

## 2022-10-04 ENCOUNTER — Other Ambulatory Visit: Payer: Self-pay

## 2022-10-04 ENCOUNTER — Ambulatory Visit (INDEPENDENT_AMBULATORY_CARE_PROVIDER_SITE_OTHER): Payer: Medicaid Other | Admitting: Nurse Practitioner

## 2022-10-04 VITALS — BP 130/80 | HR 98 | Temp 98.2°F | Resp 18 | Ht 65.0 in | Wt 216.5 lb

## 2022-10-04 DIAGNOSIS — N83201 Unspecified ovarian cyst, right side: Secondary | ICD-10-CM

## 2022-10-04 DIAGNOSIS — K529 Noninfective gastroenteritis and colitis, unspecified: Secondary | ICD-10-CM | POA: Diagnosis not present

## 2022-10-04 DIAGNOSIS — K5903 Drug induced constipation: Secondary | ICD-10-CM | POA: Diagnosis not present

## 2022-10-04 MED ORDER — DOCUSATE SODIUM 100 MG PO CAPS: 100.0000 mg | ORAL_CAPSULE | Freq: Two times a day (BID) | ORAL | 0 refills | Status: DC

## 2022-10-11 ENCOUNTER — Encounter: Payer: Medicaid Other | Admitting: Family Medicine

## 2022-10-15 ENCOUNTER — Ambulatory Visit: Payer: Medicaid Other | Admitting: Urology

## 2022-10-16 DIAGNOSIS — L732 Hidradenitis suppurativa: Principal | ICD-10-CM

## 2022-10-16 MED ORDER — SPIRONOLACTONE 100 MG TABLET
ORAL_TABLET | Freq: Every day | ORAL | 3 refills | 90.00000 days | Status: CP
Start: 2022-10-16 — End: 2023-10-16

## 2022-10-19 ENCOUNTER — Encounter: Payer: Self-pay | Admitting: Family Medicine

## 2022-10-19 MED ORDER — DOXYCYCLINE HYCLATE 100 MG PO TABS: 100.0000 mg | ORAL_TABLET | Freq: Two times a day (BID) | ORAL | 0 refills | Status: AC

## 2022-10-19 MED ORDER — FLUCONAZOLE 150 MG PO TABS: 150.0000 mg | ORAL_TABLET | ORAL | 0 refills | Status: DC | PRN

## 2022-10-23 ENCOUNTER — Ambulatory Visit
Admission: EM | Admit: 2022-10-23 | Discharge: 2022-10-23 | Disposition: A | Discharge status: Home / Self Care | Payer: Medicaid Other | Attending: Urgent Care | Admitting: Urgent Care

## 2022-10-23 ENCOUNTER — Ambulatory Visit (INDEPENDENT_AMBULATORY_CARE_PROVIDER_SITE_OTHER): Payer: Medicaid Other

## 2022-10-23 DIAGNOSIS — G40909 Epilepsy, unspecified, not intractable, without status epilepticus: Secondary | ICD-10-CM

## 2022-10-23 DIAGNOSIS — J019 Acute sinusitis, unspecified: Secondary | ICD-10-CM | POA: Diagnosis not present

## 2022-10-23 DIAGNOSIS — R059 Cough, unspecified: Secondary | ICD-10-CM | POA: Diagnosis not present

## 2022-10-23 MED ORDER — PREDNISONE 20 MG PO TABS: ORAL_TABLET | ORAL | 0 refills | Status: DC

## 2022-10-23 NOTE — Progress Notes (Deleted)
Annual Physical Exam   Name: Destiny Flores   MRN: [id number]    DOB: 12-11-1980   Date:10/23/2022  Today's Provider: Jacquelin Hawking, MHS, PA-C Introduced myself to the patient as a PA-C and provided education on APPs in clinical practice.         Subjective  Chief Complaint  No chief complaint on file.   HPI  Patient presents for annual CPE.  Diet: *** Exercise: ***  Sleep:*** Mood:***  Flowsheet Row Office Visit from 10/04/2022 in [facility]  AUDIT-C Score 0      Depression: Phq 9 is  {Desc; negative/positive:13464}    10/04/2022    8:35 AM 09/10/2022    1:17 PM 06/19/2022    8:26 AM 06/05/2022   10:13 AM 04/10/2022   10:12 AM  Depression screen PHQ 2/9  Decreased Interest 0 0 1 1 0  Down, Depressed, Hopeless 0 0 1 0 0  PHQ - 2 Score 0 0 2 1 0  Altered sleeping  0 1 0 0  Tired, decreased energy  0 1 0 0  Change in appetite  0 0 0 0  Feeling bad or failure about yourself   0 0 0 0  Trouble concentrating  0 0 0 0  Moving slowly or fidgety/restless  0 0 0 0  Suicidal thoughts  0 0 0 0  PHQ-9 Score  0 4 1 0  Difficult doing work/chores  Not difficult at all Somewhat difficult Not difficult at all Not difficult at all   Hypertension: BP Readings from Last 3 Encounters:  10/23/22 120/88  10/04/22 130/80  09/30/22 109/76   Obesity: Wt Readings from Last 3 Encounters:  10/04/22 216 lb 8 oz (98.2 kg)  09/10/22 218 lb 4.8 oz (99 kg)  06/19/22 226 lb 11.2 oz (102.8 kg)   BMI Readings from Last 3 Encounters:  10/04/22 36.03 kg/m  09/10/22 36.33 kg/m  06/19/22 37.72 kg/m     Vaccines:   HPV: aged out  Tdap: UTD next due in 2029 Shingrix: NA  Pneumonia: NA Flu: Next due after Aug 2024 COVID-19: Discussed vaccine and booster recommendations per available CDC guidelines     Hep C Screening: completed  HIV screening: completed  STD testing and prevention (HIV/chl/gon/syphilis):  Intimate partner violence: {Desc;  negative/positive:13464} Sexual History : Menstrual History/LMP/Abnormal Bleeding:  Discussed importance of follow up if any post-menopausal bleeding: {Response; yes/no/na:63} Incontinence Symptoms: {yes no:314532}  Breast cancer hx:  - Last Mammogram: last mammo completed in 2022 - next one scheduled for 10/31/2022 - BRCA gene screening:   Osteoporosis Prevention : Discussed high calcium and vitamin D supplementation, weight bearing exercises Bone density :not applicable  Cervical cancer screening: UTD, next due in 2026  Skin cancer: Discussed monitoring for atypical lesions  Colorectal cancer screening: NA at this time    Lung cancer:  Low Dose CT Chest recommended if Age 93-80 years, 20 pack-year currently smoking OR have quit w/in 15years. Patient {DOES NOT does:27190::"does not"} qualify.   ECG: NA  Advanced Care Planning: A voluntary discussion about advance care planning including the explanation and discussion of advance directives.  Discussed health care proxy and Living will, and the patient was able to identify a health care proxy as ***.  Patient {DOES_DOES YQM:57846} have a living will in effect.  Lipids: Lab Results  Component Value Date   CHOL 226 (H) 09/25/2021   CHOL 218 (H) 05/17/2020   CHOL 206 (H)  04/10/2019   Lab Results  Component Value Date   HDL 47 (L) 09/25/2021   HDL 54 05/17/2020   HDL 48 (L) 04/10/2019   Lab Results  Component Value Date   LDLCALC 150 (H) 09/25/2021   LDLCALC 141 (H) 05/17/2020   LDLCALC 132 (H) 04/10/2019   Lab Results  Component Value Date   TRIG 154 (H) 09/25/2021   TRIG 110 05/17/2020   TRIG 146 04/10/2019   Lab Results  Component Value Date   CHOLHDL 4.8 09/25/2021   CHOLHDL 4.0 05/17/2020   CHOLHDL 4.3 04/10/2019   No results found for: "LDLDIRECT"  Glucose: Glucose, Bld  Date Value Ref Range Status  09/30/2022 101 (H) 70 - 99 mg/dL Final    Comment:    Glucose reference range applies only to samples taken  after fasting for at least 8 hours.  04/06/2022 92 70 - 99 mg/dL Final    Comment:    Glucose reference range applies only to samples taken after fasting for at least 8 hours.  03/29/2022 84 65 - 99 mg/dL Final    Comment:    .            Fasting reference interval .     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    AXILLARY HIDRADENITIS EXCISION     MULTIPLE HYDRADENITIS SURGERIES IN [place]   hidradenitis groin Left 04/2017   last surgery done on groin and the symptoms have returned   HYDRADENITIS EXCISION N/A 01/16/2018   Procedure: EXCISION HIDRADENITIS GROIN;  Surgeon: Leafy Ro, MD;  Location: [facility] ORS;  Service: General;  Laterality: N/A;   KNEE SURGERY Left 2010   tendon and meniscus repair   NOSE SURGERY  1998   deviated septum and sinus repair   ROBOTIC ASSISTED LAPAROSCOPIC CHOLECYSTECTOMY N/A 07/24/2018   Procedure: ROBOTIC ASSISTED LAPAROSCOPIC CHOLECYSTECTOMY;  Surgeon: Leafy Ro, MD;  Location: [facility] ORS;  Service: General;  Laterality: N/A;   TONSILLECTOMY     TYMPANOSTOMY TUBE PLACEMENT     as a kid   VAGUS NERVE STIMULATOR GENERATOR CHANGE  03/2015    Family History  Problem Relation Age of Onset   Hyperlipidemia Father    Epilepsy Brother    ADD / ADHD Brother    Breast cancer Paternal Grandmother 33       Passed away at 72yo   Asthma Mother      Social History   Socioeconomic History   Marital status: Single    Spouse name: Not on file   Number of children: 0   Years of education: 16   Highest education level: Bachelor's degree (e.g., BA, AB, BS)  Occupational History   Occupation: unemployeed  Tobacco Use   Smoking status: Never   Smokeless tobacco: Never  Vaping Use   Vaping Use: Never used  Substance and Sexual Activity   Alcohol use: Never   Drug use: Never  Sexual activity: Never    Partners: Male  Other Topics Concern   Not on file  Social History Narrative   Not on file   Social Determinants of Health   Financial Resource Strain: High Risk (10/03/2022)   Overall Financial Resource Strain (CARDIA)    Difficulty of Paying Living Expenses: Hard  Food Insecurity: Food Insecurity Present (10/03/2022)   Hunger Vital Sign    Worried About Running Out of Food in the Last Year: Often true    Ran Out of Food in the Last Year: Sometimes true  Transportation Needs: No Transportation Needs (10/03/2022)   PRAPARE - Administrator, Civil Service (Medical): No    Lack of Transportation (Non-Medical): No  Physical Activity: Insufficiently Active (10/03/2022)   Exercise Vital Sign    Days of Exercise per Week: 3 days    Minutes of Exercise per Session: 10 min  Stress: No Stress Concern Present (10/03/2022)   [employer] of Occupational Health - Occupational Stress Questionnaire    Feeling of Stress : Only a little  Social Connections: Moderately Integrated (10/03/2022)   Social Connection and Isolation Panel [NHANES]    Frequency of Communication with Friends and Family: More than three times a week    Frequency of Social Gatherings with Friends and Family: Twice a week    Attends Religious Services: More than 4 times per year    Active Member of [employer] or Organizations: Yes    Attends Banker Meetings: More than 4 times per year    Marital Status: Never married  Intimate Partner Violence:  Not At Risk (09/25/2021)   Humiliation, Afraid, Rape, and Kick questionnaire    Fear of Current or Ex-Partner: No    Emotionally Abused: No    Physically Abused: No    Sexually Abused: No     Current Outpatient Medications:    ascorbic acid (VITAMIN C) 1000 MG tablet, Take by mouth., Disp: , Rfl:    Azelastine HCl 137 MCG/SPRAY SOLN, PLACE 2 SPRAYS INTO BOTH NOSTRILS 2 (TWO) TIMES DAILY AS NEEDED FOR RHINITIS. USE IN EACH NOSTRIL AS DIRECTED, Disp: 2 mL, Rfl: 1   Brivaracetam (BRIVIACT) 100 MG TABS, Take 1 tablet by mouth 2 (two) times daily., Disp: , Rfl:    dicyclomine (BENTYL) 10 MG capsule, Take 1 capsule (10 mg total) by mouth 4 (four) times daily -  before meals and at bedtime., Disp: 30 capsule, Rfl: 0   docusate sodium (COLACE) 100 MG capsule, Take 1 capsule (100 mg total) by mouth every 12 (twelve) hours., Disp: 30 capsule, Rfl: 0   doxycycline (VIBRA-TABS) 100 MG tablet, Take 1 tablet (100 mg total) by mouth 2 (two) times daily for 10 days., Disp: 20 tablet, Rfl: 0   famotidine (PEPCID) 40 MG tablet, Take 0.5 tablets (20 mg total) by mouth 2 (two) times daily as needed for heartburn or indigestion., Disp: 180 tablet, Rfl: 1   felbamate (FELBATOL) 600 MG tablet, Take 600 mg by mouth 3 (three) times daily., Disp: , Rfl:    fluconazole (DIFLUCAN) 150 MG tablet, Take 1 tablet (150 mg total) by mouth every 3 (three) days as needed for up to 10 doses (for vaginal itching/yeast infection sx)., Disp: 10 tablet, Rfl: 0   fluconazole (DIFLUCAN) 150 MG tablet, Take 1 tablet (150 mg total) by mouth every 3 (three) days as needed (for vaginal itching/yeast infection sx)., Disp: 2 tablet, Rfl: 0   HUMIRA, 2 PEN, 80 MG/0.8ML PNKT, Inject into  the skin., Disp: , Rfl:    HYDROcodone-acetaminophen (NORCO/VICODIN) 5-325 MG tablet, Take 1 tablet by mouth every 4 (four) hours as needed for moderate pain., Disp: 12 tablet, Rfl: 0   levocetirizine (XYZAL) 5 MG tablet, TAKE 1 TABLET BY MOUTH EVERY DAY IN THE  EVENING, Disp: 90 tablet, Rfl: 1   loperamide (IMODIUM A-D) 2 MG tablet, Take 1 tablet (2 mg total) by mouth 4 (four) times daily as needed., Disp: 30 tablet, Rfl: 0   magnesium oxide (MAG-OX) 400 MG tablet, Take by mouth., Disp: , Rfl:    Melatonin 3 MG CAPS, Take by mouth., Disp: , Rfl:    metFORMIN (GLUCOPHAGE) 500 MG tablet, Take by mouth., Disp: , Rfl:    metroNIDAZOLE (FLAGYL) 500 MG tablet, Take 1 tablet (500 mg total) by mouth 2 (two) times daily., Disp: 14 tablet, Rfl: 0   naproxen (NAPROSYN) 500 MG tablet, Take 1 tablet (500 mg total) by mouth 2 (two) times daily with a meal., Disp: 60 tablet, Rfl: 11   ondansetron (ZOFRAN-ODT) 4 MG disintegrating tablet, Take 1 tablet (4 mg total) by mouth every 8 (eight) hours as needed., Disp: 20 tablet, Rfl: 0   pantoprazole (PROTONIX) 40 MG tablet, TAKE 1 TABLET BY MOUTH TWICE A DAY, Disp: 180 tablet, Rfl: 1   predniSONE (DELTASONE) 20 MG tablet, Take 2 tablets daily with breakfast., Disp: 10 tablet, Rfl: 0   pyridOXINE (VITAMIN B-6) 100 MG tablet, Take 100 mg by mouth daily., Disp: , Rfl:    spironolactone (ALDACTONE) 100 MG tablet, Take by mouth., Disp: , Rfl:    SUMAtriptan (IMITREX) 50 MG tablet, Take 50 mg by mouth daily as needed., Disp: , Rfl:    topiramate (TOPAMAX) 25 MG tablet, Take 50 mg by mouth at bedtime., Disp: , Rfl:    TURMERIC CURCUMIN PO, Take 2 capsules by mouth daily., Disp: , Rfl:   Allergies  Allergen Reactions   Codeine Anaphylaxis   Penicillins Anaphylaxis and Other (See Comments)    Has patient had a PCN reaction causing immediate rash, facial/tongue/throat swelling, SOB or lightheadedness with hypotension: Yes Has patient had a PCN reaction causing severe rash involving mucus membranes or skin necrosis: No Has patient had a PCN reaction that required hospitalization: Yes Has patient had a PCN reaction occurring within the last 10 years: No If all of the above answers are "NO", then may proceed with Cephalosporin  use.  Has patient had a PCN reaction causing immediate rash, facial/tongue/throat swelling, SOB or lightheadedness with hypotension: Yes Has patient had a PCN reaction causing severe rash involving mucus membranes or skin necrosis: No Has patient had a PCN reaction that required hospitalization: Yes Has patient had a PCN reaction occurring within the last 10 years: No If all of the above answers are "NO", then may proceed with Cephalosporin use. Has patient had a PCN reaction causing immediate rash, facial/tongue/throat swelling, SOB or lightheadedness with hypotension: Yes Has patient had a PCN reaction causing severe rash involving mucus membranes or skin necrosis: No Has patient had a PCN reaction that required hospitalization: Yes Has patient had a PCN reaction occurring within the last 10 years: No If all of the above answers are "NO", then may proceed with Cephalosporin use.   Sulfa Antibiotics Hives   Tape Other (See Comments)    Large Boils-PAPER TAPE OK TO USE Large Boils-PAPER TAPE OK TO USE     ROS    Objective  There were no vitals filed for this visit.  There is no height or weight on file to calculate BMI.  Physical Exam   Recent Results (from the past 2160 hour(s))  POCT rapid strep A     Status: None   Collection Time: 09/10/22  1:32 PM  Result Value Ref Range   Rapid Strep A Screen Negative Negative  CBC with Differential     Status: Abnormal   Collection Time: 09/30/22  8:21 PM  Result Value Ref Range   WBC 10.2 4.0 - 10.5 K/uL   RBC 4.67 3.87 - 5.11 MIL/uL   Hemoglobin 14.1 12.0 - 15.0 g/dL   HCT 16.1 09.6 - 04.5 %   MCV 90.4 80.0 - 100.0 fL   MCH 30.2 26.0 - 34.0 pg   MCHC 33.4 30.0 - 36.0 g/dL   RDW 40.9 81.1 - 91.4 %   Platelets 506 (H) 150 - 400 K/uL   nRBC 0.0 0.0 - 0.2 %   Neutrophils Relative % 83 %   Neutro Abs 8.4 (H) 1.7 - 7.7 K/uL   Lymphocytes Relative 8 %   Lymphs Abs 0.8 0.7 - 4.0 K/uL   Monocytes Relative 7 %   Monocytes  Absolute 0.7 0.1 - 1.0 K/uL   Eosinophils Relative 2 %   Eosinophils Absolute 0.2 0.0 - 0.5 K/uL   Basophils Relative 0 %   Basophils Absolute 0.0 0.0 - 0.1 K/uL   Immature Granulocytes 0 %   Abs Immature Granulocytes 0.04 0.00 - 0.07 K/uL    Comment: Performed at [facility], [address] [place], [place] [address]  Comprehensive metabolic panel     Status: Abnormal   Collection Time: 09/30/22  8:21 PM  Result Value Ref Range   Sodium 135 135 - 145 mmol/L   Potassium 3.5 3.5 - 5.1 mmol/L   Chloride 103 98 - 111 mmol/L   CO2 24 22 - 32 mmol/L   Glucose, Bld 101 (H) 70 - 99 mg/dL    Comment: Glucose reference range applies only to samples taken after fasting for at least 8 hours.   BUN 13 6 - 20 mg/dL   Creatinine, Ser 6.21 0.44 - 1.00 mg/dL   Calcium 8.8 (L) 8.9 - 10.3 mg/dL   Total Protein 8.0 6.5 - 8.1 g/dL   Albumin 4.0 3.5 - 5.0 g/dL   AST 28 15 - 41 U/L   ALT 21 0 - 44 U/L   Alkaline Phosphatase 84 38 - 126 U/L   Total Bilirubin 0.7 0.3 - 1.2 mg/dL   GFR, Estimated >30 >86 mL/min    Comment: (NOTE) Calculated using the CKD-EPI Creatinine Equation (2021)    Anion gap 8 5 - 15    Comment: Performed at [facility], [address] [place], [place] [address]  Lipase, blood     Status: None   Collection Time: 09/30/22  8:21 PM  Result Value Ref Range   Lipase 30 11 - 51 U/L    Comment: Performed at [facility], [address] [place], [place] [address]  Urinalysis, Complete w Microscopic -Urine, Clean Catch     Status: Abnormal   Collection Time: 09/30/22  8:21 PM  Result Value Ref Range   Color, Urine YELLOW (A) YELLOW   APPearance HAZY (A) CLEAR   Specific Gravity, Urine 1.028 1.005 - 1.030   pH 5.0 5.0 - 8.0   Glucose, UA NEGATIVE NEGATIVE mg/dL   Hgb urine dipstick NEGATIVE NEGATIVE   Bilirubin Urine NEGATIVE NEGATIVE   Ketones, ur  5 (A) NEGATIVE mg/dL   Protein, ur NEGATIVE NEGATIVE mg/dL   Nitrite NEGATIVE NEGATIVE    Leukocytes,Ua NEGATIVE NEGATIVE    Comment: Performed at [facility], [address] [place], [place] [address]  POC Urine Pregnancy, ED     Status: None   Collection Time: 09/30/22  8:24 PM  Result Value Ref Range   Preg Test, Ur Negative Negative     Fall Risk:    10/04/2022    8:34 AM 09/10/2022    1:17 PM 06/19/2022    8:25 AM 06/05/2022   10:09 AM 04/10/2022   10:12 AM  Fall Risk   Falls in the past year? 0 0 1 1 0  Number falls in past yr: 0 0 0 0 0  Injury with Fall? 0 0 1 1 0  Risk for fall due to :  No Fall Risks Impaired balance/gait  No Fall Risks  Follow up  Falls prevention discussed;Education provided;Falls evaluation completed Falls prevention discussed;Education provided;Falls evaluation completed  Falls evaluation completed;Education provided;Falls prevention discussed     Functional Status Survey:     Assessment & Plan  Problem List Items Addressed This Visit   None    No follow-ups on file.   I, Annasofia Pohl E Siris Hoos, PA-C, have reviewed all documentation for this visit. The documentation on 10/23/22 for the exam, diagnosis, procedures, and orders are all accurate and complete.   Jacquelin Hawking, MHS, PA-C [facility]    -USPSTF grade A and B recommendations reviewed with patient; age-appropriate recommendations, preventive care, screening tests, etc discussed and encouraged; healthy living encouraged; see AVS for patient education given to patient -Discussed importance of 150 minutes of physical activity weekly, eat two servings of fish weekly, eat one serving of tree nuts ( cashews, pistachios, pecans, almonds.Marland Kitchen) every other day, eat 6 servings of fruit/vegetables daily and drink plenty of water and avoid sweet beverages.   -Reviewed Health Maintenance: {yes YQ:657846}

## 2022-10-23 NOTE — ED Provider Notes (Signed)
[facility]  Note:  This document was prepared using Conservation officer, historic buildings and may include unintentional dictation errors.  MRN: [id number] DOB: 11-Feb-1981  Subjective:   Destiny Flores is a 42 y.o. female presenting for 4 to 5-day history of acute onset persistent congestion, drainage, productive cough.  Notified her PCP, was prescribed doxycycline 2 days into her symptoms.  Patient feels like it has not helped.  Feels like it is in her chest that has concerns about having pneumonia.  She does have allergic rhinitis and chronic sinusitis.  Unfortunately she cannot take allergy medications as they have caused seizures in the past.  She does have a seizure disorder, immunosuppression from her medications.  No shortness of breath or wheezing.  No asthma.  Has previously done well with prednisone.  Would like [place] to consider this.  No current facility-administered medications for this encounter.  Current Outpatient Medications:    ascorbic acid (VITAMIN C) 1000 MG tablet, Take by mouth., Disp: , Rfl:    Azelastine HCl 137 MCG/SPRAY SOLN, PLACE 2 SPRAYS INTO BOTH NOSTRILS 2 (TWO) TIMES DAILY AS NEEDED FOR RHINITIS. USE IN EACH NOSTRIL AS DIRECTED, Disp: 2 mL, Rfl: 1   Brivaracetam (BRIVIACT) 100 MG TABS, Take 1 tablet by mouth 2 (two) times daily., Disp: , Rfl:    dicyclomine (BENTYL) 10 MG capsule, Take 1 capsule (10 mg total) by mouth 4 (four) times daily -  before meals and at bedtime., Disp: 30 capsule, Rfl: 0   docusate sodium (COLACE) 100 MG capsule, Take 1 capsule (100 mg total) by mouth every 12 (twelve) hours., Disp: 30 capsule, Rfl: 0   doxycycline (VIBRA-TABS) 100 MG tablet, Take 1 tablet (100 mg total) by mouth 2 (two) times daily for 10 days., Disp: 20 tablet, Rfl: 0   famotidine (PEPCID) 40 MG tablet, Take 0.5 tablets (20 mg total) by mouth 2 (two) times daily as needed for heartburn or indigestion., Disp: 180 tablet, Rfl: 1   felbamate (FELBATOL) 600  MG tablet, Take 600 mg by mouth 3 (three) times daily., Disp: , Rfl:    fluconazole (DIFLUCAN) 150 MG tablet, Take 1 tablet (150 mg total) by mouth every 3 (three) days as needed for up to 10 doses (for vaginal itching/yeast infection sx)., Disp: 10 tablet, Rfl: 0   fluconazole (DIFLUCAN) 150 MG tablet, Take 1 tablet (150 mg total) by mouth every 3 (three) days as needed (for vaginal itching/yeast infection sx)., Disp: 2 tablet, Rfl: 0   HUMIRA, 2 PEN, 80 MG/0.8ML PNKT, Inject into the skin., Disp: , Rfl:    HYDROcodone-acetaminophen (NORCO/VICODIN) 5-325 MG tablet, Take 1 tablet by mouth every 4 (four) hours as needed for moderate pain., Disp: 12 tablet, Rfl: 0   levocetirizine (XYZAL) 5 MG tablet, TAKE 1 TABLET BY MOUTH EVERY DAY IN THE EVENING, Disp: 90 tablet, Rfl: 1   loperamide (IMODIUM A-D) 2 MG tablet, Take 1 tablet (2 mg total) by mouth 4 (four) times daily as needed., Disp: 30 tablet, Rfl: 0   magnesium oxide (MAG-OX) 400 MG tablet, Take by mouth., Disp: , Rfl:    Melatonin 3 MG CAPS, Take by mouth., Disp: , Rfl:    metFORMIN (GLUCOPHAGE) 500 MG tablet, Take by mouth., Disp: , Rfl:    metroNIDAZOLE (FLAGYL) 500 MG tablet, Take 1 tablet (500 mg total) by mouth 2 (two) times daily., Disp: 14 tablet, Rfl: 0   naproxen (NAPROSYN) 500 MG tablet, Take 1 tablet (500 mg total) by  mouth 2 (two) times daily with a meal., Disp: 60 tablet, Rfl: 11   ondansetron (ZOFRAN-ODT) 4 MG disintegrating tablet, Take 1 tablet (4 mg total) by mouth every 8 (eight) hours as needed., Disp: 20 tablet, Rfl: 0   pantoprazole (PROTONIX) 40 MG tablet, TAKE 1 TABLET BY MOUTH TWICE A DAY, Disp: 180 tablet, Rfl: 1   pyridOXINE (VITAMIN B-6) 100 MG tablet, Take 100 mg by mouth daily., Disp: , Rfl:    spironolactone (ALDACTONE) 100 MG tablet, Take by mouth., Disp: , Rfl:    SUMAtriptan (IMITREX) 50 MG tablet, Take 50 mg by mouth daily as needed., Disp: , Rfl:    topiramate (TOPAMAX) 25 MG tablet, Take 50 mg by mouth at  bedtime., Disp: , Rfl:    TURMERIC CURCUMIN PO, Take 2 capsules by mouth daily., Disp: , Rfl:    Allergies  Allergen Reactions   Codeine Anaphylaxis   Penicillins Anaphylaxis and Other (See Comments)    Has patient had a PCN reaction causing immediate rash, facial/tongue/throat swelling, SOB or lightheadedness with hypotension: Yes Has patient had a PCN reaction causing severe rash involving mucus membranes or skin necrosis: No Has patient had a PCN reaction that required hospitalization: Yes Has patient had a PCN reaction occurring within the last 10 years: No If all of the above answers are "NO", then may proceed with Cephalosporin use.  Has patient had a PCN reaction causing immediate rash, facial/tongue/throat swelling, SOB or lightheadedness with hypotension: Yes Has patient had a PCN reaction causing severe rash involving mucus membranes or skin necrosis: No Has patient had a PCN reaction that required hospitalization: Yes Has patient had a PCN reaction occurring within the last 10 years: No If all of the above answers are "NO", then may proceed with Cephalosporin use. Has patient had a PCN reaction causing immediate rash, facial/tongue/throat swelling, SOB or lightheadedness with hypotension: Yes Has patient had a PCN reaction causing severe rash involving mucus membranes or skin necrosis: No Has patient had a PCN reaction that required hospitalization: Yes Has patient had a PCN reaction occurring within the last 10 years: No If all of the above answers are "NO", then may proceed with Cephalosporin use.   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   Family history of breast cancer    5/23 cancer genetic testing letter sent   GERD (gastroesophageal reflux disease)    Headache    Hidradenitis suppurativa 12/24/2017   History of kidney stones    H/O   History of methicillin resistant staphylococcus aureus (MRSA) 2008   positive from axilla, left   Hx of Kawasaki's disease    Hydradenitis    Seizures    LAST SEIZURE 2012   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    AXILLARY HIDRADENITIS EXCISION     MULTIPLE HYDRADENITIS SURGERIES IN [place]  hidradenitis groin Left 04/2017   last surgery done on groin and the symptoms have returned   HYDRADENITIS EXCISION N/A 01/16/2018   Procedure: EXCISION HIDRADENITIS GROIN;  Surgeon: Leafy Ro, MD;  Location: [facility] ORS;  Service: General;  Laterality: N/A;   KNEE SURGERY Left 2010   tendon and meniscus repair   NOSE SURGERY  1998   deviated septum and sinus repair   ROBOTIC ASSISTED LAPAROSCOPIC CHOLECYSTECTOMY N/A 07/24/2018   Procedure: ROBOTIC ASSISTED LAPAROSCOPIC CHOLECYSTECTOMY;  Surgeon: Leafy Ro, MD;  Location: [facility] ORS;  Service: General;  Laterality: N/A;   TONSILLECTOMY     TYMPANOSTOMY TUBE PLACEMENT     as a kid   VAGUS NERVE STIMULATOR GENERATOR CHANGE  03/2015    Family History  Problem Relation Age of Onset   Hyperlipidemia Father    Epilepsy Brother    ADD / ADHD Brother    Breast cancer Paternal Grandmother 16       Passed away at 70yo   Asthma Mother     Social History   Tobacco Use   Smoking status: Never   Smokeless tobacco: Never  Vaping Use   Vaping Use: Never used  Substance Use Topics   Alcohol use: Never   Drug use: Never    ROS   Objective:    Vitals: BP 120/88 (BP Location: Right Arm)   Pulse 97   Temp 99.1 F (37.3 C) (Oral)   Resp 20   LMP 10/16/2022 (Approximate)   SpO2 97%   Physical Exam Constitutional:      General: She is not in acute distress.    Appearance: Normal appearance. She is well-developed and normal weight. She is not ill-appearing, toxic-appearing or diaphoretic.  HENT:     Head: Normocephalic and atraumatic.     Right Ear: Tympanic membrane, ear canal and external ear normal. No drainage or tenderness. No middle ear effusion. There is no impacted cerumen. Tympanic membrane is not erythematous or bulging.     Left Ear: Tympanic membrane, ear canal and external ear normal. No drainage or tenderness.  No middle ear effusion. There is no impacted cerumen. Tympanic membrane is not erythematous or bulging.     Nose: Congestion and rhinorrhea present.     Mouth/Throat:     Mouth: Mucous membranes are moist. No oral lesions.     Pharynx: No pharyngeal swelling, oropharyngeal exudate, posterior oropharyngeal erythema or uvula swelling.     Tonsils: No tonsillar exudate or tonsillar abscesses.  Eyes:     General: No scleral icterus.       Right eye: No discharge.        Left eye: No discharge.     Extraocular Movements: Extraocular movements intact.     Right eye: Normal extraocular motion.     Left eye: Normal extraocular motion.     Conjunctiva/sclera: Conjunctivae normal.  Cardiovascular:     Rate and Rhythm: Normal rate and regular rhythm.     Heart sounds: Normal heart sounds. No murmur heard.    No friction rub. No gallop.  Pulmonary:     Effort: Pulmonary effort is normal. No respiratory distress.     Breath sounds: No stridor. No wheezing, rhonchi or rales.  Chest:     Chest wall: No tenderness.  Musculoskeletal:     Cervical back: Normal range of motion and neck supple.  Lymphadenopathy:     Cervical: No cervical adenopathy.  Skin:    General: Skin is warm and dry.  Neurological:      General: No focal deficit present.     Mental Status: She is alert and oriented to person, place, and time.  Psychiatric:        Mood and Affect: Mood normal.        Behavior: Behavior normal.     DG Chest 2 View  Result Date: 10/23/2022 CLINICAL DATA:  Cough and congestion EXAM: CHEST - 2 VIEW COMPARISON:  None Available. FINDINGS: The heart size and mediastinal contours are within normal limits. Both lungs are clear. The visualized skeletal structures are unremarkable. Left chest wall generator pack. IMPRESSION: No active cardiopulmonary disease. Electronically Signed   By: Allegra Lai M.D.   On: 10/23/2022 13:45     Assessment and Plan :   PDMP not reviewed this encounter.  1. Acute sinusitis, recurrence not specified, unspecified location   2. Seizure disorder    Recommended she continue taking doxycycline.  I will add prednisone as she has done well with this in the past without seizures.  Chest x-ray negative.  Continue to use supportive care otherwise. Counseled patient on potential for adverse effects with medications prescribed/recommended today, ER and return-to-clinic precautions discussed, patient verbalized understanding.    Wallis Bamberg, PA-C 10/23/22 1359

## 2022-10-23 NOTE — Progress Notes (Deleted)
Acute Office Visit   Patient: Destiny Flores   DOB: Apr 28, 1981   42 y.o. Female  MRN: [id number] Visit Date: 10/24/2022  Today's healthcare provider: Oswaldo Conroy Ehan Freas, PA-C  Introduced myself to the patient as a Secondary school teacher and provided education on APPs in clinical practice.    No chief complaint on file.  Subjective    HPI    Medications: Outpatient Medications Prior to Visit  Medication Sig   ascorbic acid (VITAMIN C) 1000 MG tablet Take by mouth.   Azelastine HCl 137 MCG/SPRAY SOLN PLACE 2 SPRAYS INTO BOTH NOSTRILS 2 (TWO) TIMES DAILY AS NEEDED FOR RHINITIS. USE IN EACH NOSTRIL AS DIRECTED   Brivaracetam (BRIVIACT) 100 MG TABS Take 1 tablet by mouth 2 (two) times daily.   dicyclomine (BENTYL) 10 MG capsule Take 1 capsule (10 mg total) by mouth 4 (four) times daily -  before meals and at bedtime.   docusate sodium (COLACE) 100 MG capsule Take 1 capsule (100 mg total) by mouth every 12 (twelve) hours.   doxycycline (VIBRA-TABS) 100 MG tablet Take 1 tablet (100 mg total) by mouth 2 (two) times daily for 10 days.   famotidine (PEPCID) 40 MG tablet Take 0.5 tablets (20 mg total) by mouth 2 (two) times daily as needed for heartburn or indigestion.   felbamate (FELBATOL) 600 MG tablet Take 600 mg by mouth 3 (three) times daily.   fluconazole (DIFLUCAN) 150 MG tablet Take 1 tablet (150 mg total) by mouth every 3 (three) days as needed for up to 10 doses (for vaginal itching/yeast infection sx).   fluconazole (DIFLUCAN) 150 MG tablet Take 1 tablet (150 mg total) by mouth every 3 (three) days as needed (for vaginal itching/yeast infection sx).   HUMIRA, 2 PEN, 80 MG/0.8ML PNKT Inject into the skin.   HYDROcodone-acetaminophen (NORCO/VICODIN) 5-325 MG tablet Take 1 tablet by mouth every 4 (four) hours as needed for moderate pain.   levocetirizine (XYZAL) 5 MG tablet TAKE 1 TABLET BY MOUTH EVERY DAY IN THE EVENING   loperamide (IMODIUM A-D) 2 MG tablet Take 1 tablet (2 mg total) by mouth 4  (four) times daily as needed.   magnesium oxide (MAG-OX) 400 MG tablet Take by mouth.   Melatonin 3 MG CAPS Take by mouth.   metFORMIN (GLUCOPHAGE) 500 MG tablet Take by mouth.   metroNIDAZOLE (FLAGYL) 500 MG tablet Take 1 tablet (500 mg total) by mouth 2 (two) times daily.   naproxen (NAPROSYN) 500 MG tablet Take 1 tablet (500 mg total) by mouth 2 (two) times daily with a meal.   ondansetron (ZOFRAN-ODT) 4 MG disintegrating tablet Take 1 tablet (4 mg total) by mouth every 8 (eight) hours as needed.   pantoprazole (PROTONIX) 40 MG tablet TAKE 1 TABLET BY MOUTH TWICE A DAY   predniSONE (DELTASONE) 20 MG tablet Take 2 tablets daily with breakfast.   pyridOXINE (VITAMIN B-6) 100 MG tablet Take 100 mg by mouth daily.   spironolactone (ALDACTONE) 100 MG tablet Take by mouth.   SUMAtriptan (IMITREX) 50 MG tablet Take 50 mg by mouth daily as needed.   topiramate (TOPAMAX) 25 MG tablet Take 50 mg by mouth at bedtime.   TURMERIC CURCUMIN PO Take 2 capsules by mouth daily.   No facility-administered medications prior to visit.    Review of Systems  {Labs  Heme  Chem  Endocrine  Serology  Results Review (optional):23779}   Objective    LMP 10/16/2022 (Approximate)  {Show  previous vital signs (optional):23777}  Physical Exam    No results found for any visits on 10/24/22.  Assessment & Plan      No follow-ups on file.

## 2022-10-23 NOTE — ED Triage Notes (Signed)
Pt c/o productive cough and congestion that began Thursday. Received rx for Doxy on Friday. Reports symptoms have worsened.

## 2022-10-24 ENCOUNTER — Encounter: Payer: Medicaid Other | Admitting: Physician Assistant

## 2022-10-24 NOTE — Unmapped (Signed)
The [facility] has made a third and final attempt to reach this patient to refill the following medication:Humira (CF) Pen 80mg /0.9mL.      We have left voicemails on the following phone numbers: [phone], have sent a MyChart message, and have sent a text message to the following phone numbers: [phone] .    Dates contacted: 3/18  3/22  4/24  Last scheduled delivery: 07/24/2022  (Fill scheduled for 08/22/2022 cancelled because of high co-pay with note to reschedule upon resolution. 08/31/2022 RXAMB coverage determination $4.00. No new delivery date ever confirmed)    The patient may be at risk of non-compliance with this medication. The patient should call the [facility] at [phone]  Option 4, then Option 2: Dermatology, Gastroenterology, Rheumatology to refill medication.    Alwyn Pea   [facility]

## 2022-10-24 NOTE — Unmapped (Signed)
[facility] Refill Coordination Note    Specialty Medication(s) to be Shipped:   Inflammatory Disorders: Humira    Other medication(s) to be shipped: No additional medications requested for fill at this time     Sherrilee Steigerwald, DOB: April 02, 1981  Phone: [phone] (home)       All above HIPAA information was verified with patient.     Was a Nurse, learning disability used for this call? No    Completed refill call assessment today to schedule patient's medication shipment from the [facility] [phone]).  All relevant notes have been reviewed.     Specialty medication(s) and dose(s) confirmed: Regimen is correct and unchanged.   Changes to medications: Dasja reports no changes at this time.  Changes to insurance: No  New side effects reported not previously addressed with a pharmacist or physician: None reported  Questions for the pharmacist: No    Confirmed patient received a Conservation officer, historic buildings and a Surveyor, mining with first shipment. The patient will receive a drug information handout for each medication shipped and additional FDA Medication Guides as required.       DISEASE/MEDICATION-SPECIFIC INFORMATION        For patients on injectable medications: Patient currently has 1 doses left.  Next injection is scheduled for 10/29/22.    SPECIALTY MEDICATION ADHERENCE     Medication Adherence    Patient reported X missed doses in the last month: 0  Specialty Medication: HUMIRA(CF) PEN 80 mg/0.8 mL Pnkt (adalimumab)  Patient is on additional specialty medications: No  Patient is on more than two specialty medications: No              Were doses missed due to medication being on hold? No      REFERRAL TO PHARMACIST     Referral to the pharmacist: Not needed      [facility]     Shipping address confirmed in Epic.       Delivery Scheduled: Yes, Expected medication delivery date: 10/30/22.     Medication will be delivered via UPS to the prescription address in Epic WAM.    Ernestine Mcmurray   [facility] Specialty Technician

## 2022-10-25 NOTE — Unmapped (Signed)
Erica Norman 's HUMIRA(CF) PEN 80 mg/0.8 mL Pnkt (adalimumab) shipment will be delayed as a result of prior authorization being required by the patient's insurance.     I have reached out to the patient  at [phone] and communicated the delay. We will call the patient back to reschedule the delivery upon resolution. We have not confirmed the new delivery date.

## 2022-10-26 ENCOUNTER — Ambulatory Visit: Payer: Medicaid Other | Admitting: Urology

## 2022-10-26 ENCOUNTER — Encounter: Payer: Self-pay | Admitting: Urology

## 2022-10-26 ENCOUNTER — Ambulatory Visit
Admission: RE | Admit: 2022-10-26 | Discharge: 2022-10-26 | Disposition: A | Discharge status: Home / Self Care | Payer: Medicaid Other | Source: Ambulatory Visit | Attending: Urology | Admitting: Urology

## 2022-10-26 ENCOUNTER — Ambulatory Visit
Admission: RE | Admit: 2022-10-26 | Discharge: 2022-10-26 | Disposition: A | Discharge status: Home / Self Care | Payer: Medicaid Other | Attending: Urology | Admitting: Urology

## 2022-10-26 VITALS — BP 108/77 | HR 96 | Ht 65.0 in | Wt 210.0 lb

## 2022-10-26 DIAGNOSIS — N200 Calculus of kidney: Secondary | ICD-10-CM | POA: Insufficient documentation

## 2022-10-26 NOTE — Progress Notes (Signed)
05/16/2022 11:40 AM   Destiny Flores 10-07-1980 [id number]  Referring provider: Danelle Berry, PA-C [address] [place],  [place] [address]  Chief Complaint  Patient presents with   Nephrolithiasis   Urologic History 1. Recurrent nephrolithiasis Mild hypercalciuria however her calcium oxalate and calcium phosphate supersaturation's were normal Mild hypocitraturia.  Her urine pH is elevated and would avoid potassium citrate which may worsen her calcium phosphate supersaturation.  Discussed increasing dietary citrate  HPI: 42 y.o. female presents for 6 month follow up for nephrolithiasis.  Refer to prior note on 05/16/2022 Since her last visit, she was seen in the ED on 09/30/2022 with nausea and abdominal pain and found to have gastroenteritis. A CT renal stone study was performed, which showed a punctate, non-obstructing, left renal calculus. Doing well since last visit No bothersome LUTS Denies dysuria, gross hematuria Denies flank, abdominal or pelvic pain The small stone seen on her most recent CT was also present on her prior CT in 04/2022.   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   Family history of breast cancer    5/23 cancer genetic testing letter sent   GERD (gastroesophageal reflux disease)    Headache    Hidradenitis suppurativa 12/24/2017   History of kidney stones    H/O   History of methicillin resistant staphylococcus aureus (MRSA) 2008   positive from axilla, left   Hx of Kawasaki's disease    Hydradenitis    Seizures (HCC)    LAST SEIZURE 2012   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    AXILLARY HIDRADENITIS EXCISION     MULTIPLE [place]ENIT[place] SURGERIES IN [place]   hidradenitis groin Left 04/2017   last surgery done on groin and the symptoms have returned   HYDRADENITIS EXCISION N/A 01/16/2018   Procedure: EXCISION HIDRADENITIS GROIN;  Surgeon: Leafy Ro, MD;  Location: [facility] ORS;  Service: General;  Laterality: N/A;   KNEE SURGERY Left 2010   tendon and meniscus repair   NOSE SURGERY  1998   deviated septum and sinus repair   ROBOTIC ASSISTED LAPAROSCOPIC CHOLECYSTECTOMY N/A 07/24/2018   Procedure: ROBOTIC ASSISTED LAPAROSCOPIC CHOLECYSTECTOMY;  Surgeon: Leafy Ro, MD;  Location: [facility] OR[facility];  Service: General;  Laterality: N/A;   TONSILLECTOMY     TYMPANOSTOMY TUBE PLACEMENT     as a kid   VAGUS NERVE STIMULATOR GENERATOR CHANGE  03/2015    Home Medications:  Allergies as of 10/26/2022       Reactions   Codeine Anaphylaxis   Penicillins Anaphylaxis, Other (See Comments)   Has patient had a PCN reaction causing immediate rash, facial/tongue/throat swelling, SOB or lightheadedness with hypotension: Yes Has patient had a PCN reaction causing severe rash involving mucus membranes or skin necrosis: No Has patient had a PCN reaction that required hospitalization: Yes Has patient had a PCN reaction occurring within the last 10 years: No If all of the above answers are "NO", then may proceed with Cephalosporin  use. Has patient had a PCN reaction causing immediate rash, facial/tongue/throat swelling, SOB or lightheadedness with hypotension: Yes Has patient had a PCN reaction causing severe rash involving mucus membranes or skin necrosis: No Has patient had a PCN reaction that required hospitalization: Yes Has patient had a PCN reaction occurring within the last 10 years: No If  all of the above answers are "NO", then may proceed with Cephalosporin use. Has patient had a PCN reaction causing immediate rash, facial/tongue/throat swelling, SOB or lightheadedness with hypotension: Yes Has patient had a PCN reaction causing severe rash involving mucus membranes or skin necrosis: No Has patient had a PCN reaction that required hospitalization: Yes Has patient had a PCN reaction occurring within the last 10 years: No If all of the above answers are "NO", then may proceed with Cephalosporin use.   Sulfa Antibiotics Hives   Tape Other (See Comments)   Large Boils-PAPER TAPE OK TO USE Large Boils-PAPER TAPE OK TO USE        Medication List        Accurate as of October 26, 2022 11:40 AM. If you have any questions, ask your nurse or doctor.          ascorbic acid 1000 MG tablet Commonly known as: VITAMIN C Take by mouth.   Azelastine HCl 137 MCG/SPRAY Soln PLACE 2 SPRAYS INTO BOTH NOSTRILS 2 (TWO) TIMES DAILY AS NEEDED FOR RHINITIS. USE IN EACH NOSTRIL AS DIRECTED   Briviact 100 MG Tabs tablet Generic drug: brivaracetam Take 1 tablet by mouth 2 (two) times daily.   dicyclomine 10 MG capsule Commonly known as: BENTYL Take 1 capsule (10 mg total) by mouth 4 (four) times daily -  before meals and at bedtime.   docusate sodium 100 MG capsule Commonly known as: COLACE Take 1 capsule (100 mg total) by mouth every 12 (twelve) hours.   doxycycline 100 MG tablet Commonly known as: VIBRA-TABS Take 1 tablet (100 mg total) by mouth 2 (two) times daily for 10 days.   famotidine 40 MG tablet Commonly known as: PEPCID Take 0.5 tablets (20 mg total) by mouth 2 (two) times daily as needed for heartburn or indigestion.   felbamate 600 MG tablet Commonly known as: FELBATOL Take 600 mg by mouth 3 (three) times daily.   fluconazole 150 MG tablet Commonly known as: DIFLUCAN Take 1 tablet (150 mg total) by mouth every 3 (three) days as needed for up to 10 doses (for  vaginal itching/yeast infection sx).   fluconazole 150 MG tablet Commonly known as: DIFLUCAN Take 1 tablet (150 mg total) by mouth every 3 (three) days as needed (for vaginal itching/yeast infection sx).   Humira (2 Pen) 80 MG/0.8ML Pnkt Generic drug: Adalimumab Inject into the skin.   HYDROcodone-acetaminophen 5-325 MG tablet Commonly known as: NORCO/VICODIN Take 1 tablet by mouth every 4 (four) hours as needed for moderate pain.   levocetirizine 5 MG tablet Commonly known as: XYZAL TAKE 1 TABLET BY MOUTH EVERY DAY IN THE EVENING   loperamide 2 MG tablet Commonly known as: IMODIUM A-D Take 1 tablet (2 mg total) by mouth 4 (four) times daily as needed.   magnesium oxide 400 MG tablet Commonly known as: MAG-OX Take by mouth.   Melatonin 3 MG Caps Take by mouth.   metFORMIN 500 MG tablet Commonly known as: GLUCOPHAGE Take by mouth.   metroNIDAZOLE 500 MG tablet Commonly known as: FLAGYL Take 1 tablet (500 mg total) by mouth 2 (two) times daily.  naproxen 500 MG tablet Commonly known as: Naprosyn Take 1 tablet (500 mg total) by mouth 2 (two) times daily with a meal.   ondansetron 4 MG disintegrating tablet Commonly known as: ZOFRAN-ODT Take 1 tablet (4 mg total) by mouth every 8 (eight) hours as needed.   pantoprazole 40 MG tablet Commonly known as: PROTONIX TAKE 1 TABLET BY MOUTH TWICE A DAY   predniSONE 20 MG tablet Commonly known as: DELTASONE Take 2 tablets daily with breakfast.   pyridOXINE 100 MG tablet Commonly known as: VITAMIN B6 Take 100 mg by mouth daily.   spironolactone 100 MG tablet Commonly known as: ALDACTONE Take by mouth.   SUMAtriptan 50 MG tablet Commonly known as: IMITREX Take 50 mg by mouth daily as needed.   topiramate 25 MG tablet Commonly known as: TOPAMAX Take 50 mg by mouth at bedtime.   TURMERIC CURCUMIN PO Take 2 capsules by mouth daily.        Allergies:  Allergies  Allergen Reactions   Codeine Anaphylaxis    Penicillins Anaphylaxis and Other (See Comments)    Has patient had a PCN reaction causing immediate rash, facial/tongue/throat swelling, SOB or lightheadedness with hypotension: Yes Has patient had a PCN reaction causing severe rash involving mucus membranes or skin necrosis: No Has patient had a PCN reaction that required hospitalization: Yes Has patient had a PCN reaction occurring within the last 10 years: No If all of the above answers are "NO", then may proceed with Cephalosporin use.  Has patient had a PCN reaction causing immediate rash, facial/tongue/throat swelling, SOB or lightheadedness with hypotension: Yes Has patient had a PCN reaction causing severe rash involving mucus membranes or skin necrosis: No Has patient had a PCN reaction that required hospitalization: Yes Has patient had a PCN reaction occurring within the last 10 years: No If all of the above answers are "NO", then may proceed with Cephalosporin use. Has patient had a PCN reaction causing immediate rash, facial/tongue/throat swelling, SOB or lightheadedness with hypotension: Yes Has patient had a PCN reaction causing severe rash involving mucus membranes or skin necrosis: No Has patient had a PCN reaction that required hospitalization: Yes Has patient had a PCN reaction occurring within the last 10 years: No If all of the above answers are "NO", then may proceed with Cephalosporin use.   Sulfa Antibiotics Hives   Tape Other (See Comments)    Large Boils-PAPER TAPE OK TO USE Large Boils-PAPER TAPE OK TO USE    Family History: Family History  Problem Relation Age of Onset   Hyperlipidemia Father    Epilepsy Brother    ADD / ADHD Brother    Breast cancer Paternal Grandmother 1       Passed away at 57yo   Asthma Mother     Social History:  reports that she has never smoked. She has never used smokeless tobacco. She reports that she does not drink alcohol and does not use drugs.   Physical Exam: BP 108/77    Pulse 96   Ht 5\' 5"  (1.651 m)   Wt 210 lb (95.3 kg)   LMP 10/16/2022 (Approximate)   BMI 34.95 kg/m   Constitutional:  Alert and oriented, No acute distress. HEENT: [place] AT Respiratory: Normal respiratory effort, no increased work of breathing. Psychiatric: Normal mood and affect.  Pertinent Imaging:  KUB today was personally reviewed and interpreted. No calcifications seen suspicious for urinary tract stones.  Radiologist's impression is pending.    CT abdomen/pelvis was personally  reviewed and interpreted.  EXAM: CT ABDOMEN AND PELVIS WITHOUT CONTRAST   TECHNIQUE: Multidetector CT imaging of the abdomen and pelvis was performed following the standard protocol without IV contrast.   RADIATION DOSE REDUCTION: This exam was performed according to the departmental dose-optimization program which includes automated exposure control, adjustment of the mA and/or kV according to patient size and/or use of iterative reconstruction technique.   COMPARISON:  04/06/2022   FINDINGS: Lower chest: No acute abnormality.   Hepatobiliary: No focal liver abnormality is seen. Status post cholecystectomy. No biliary dilatation.   Pancreas: Unremarkable   Spleen: Unremarkable   Adrenals/Urinary Tract: The adrenal glands are unremarkable. The kidneys are normal in size and position. Exophytic simple cyst arising from the interpolar region of the left kidney is better visualized on prior examination and is unchanged. 2 mm stable nonobstructing calculus noted within the interpolar region of the left kidney. No ureteral calculi. No hydronephrosis. No perinephric inflammatory stranding or fluid collections are identified. The bladder is unremarkable.   Stomach/Bowel: The stomach and small bowel are unremarkable. Appendix normal. The colon is diffusely fluid-filled, a nonspecific finding that be seen in the setting of enterocolitis or malabsorption and may present clinically as  diarrhea. No superimposed pericolonic inflammatory stranding, mesenteric edema, or free intraperitoneal gas or fluid. No evidence of obstruction. Appendix normal.   Vascular/Lymphatic: No significant vascular findings are present. No enlarged abdominal or pelvic lymph nodes.   Reproductive: Slight interval increase in size of simple appearing cyst within the right ovary measuring 3.6 x 3.9 x 4.7 cm in size, likely representing a follicular cyst. No follow-up imaging is recommended in a patient of this age. The pelvic organs are otherwise unremarkable.   Other: No abdominal wall hernia   Musculoskeletal: No acute or significant osseous findings.   IMPRESSION: 1. The colon is diffusely fluid-filled, a nonspecific finding that be seen in the setting of enterocolitis or malabsorption and may present clinically as diarrhea. No superimposed inflammatory change identified. 2. Minimal left nonobstructing nephrolithiasis. No urolithiasis. No hydronephrosis. 3. 4.7 cm right ovarian simple-appearing cyst, likely a follicular cyst. No follow-up imaging recommended.     Electronically Signed   By: Helyn Numbers M.D.   On: 09/30/2022 21:37   CT abdomen/pelvis was personally reviewed and interpreted. EXAM: CT ABDOMEN AND PELVIS WITHOUT CONTRAST   TECHNIQUE: Multidetector CT imaging of the abdomen and pelvis was performed following the standard protocol without IV contrast.   RADIATION DOSE REDUCTION: This exam was performed according to the departmental dose-optimization program which includes automated exposure control, adjustment of the mA and/or kV according to patient size and/or use of iterative reconstruction technique.   COMPARISON:  10/21/2021   FINDINGS: Lower chest: Lung bases clear   Hepatobiliary: Gallbladder surgically absent. Liver normal appearance   Pancreas: Normal appearance   Spleen: Normal appearance   Adrenals/Urinary Tract: Adrenal glands normal  appearance. RIGHT kidney normal appearance without mass or calculus. Small cyst lateral mid LEFT kidney 10 mm diameter unchanged. Tiny nonobstructing calculus mid LEFT kidney sagittal image 82. Minimally prominent RIGHT renal pelvis though no obstructing calculus or ureteral dilatation identified. Bladder and ureters normal appearance.   Stomach/Bowel: Appendix not visualized, no pericecal inflammatory process seen. Mild diverticulosis of descending colon without evidence of diverticulitis. Stomach and remaining bowel loops normal appearance.   Vascular/Lymphatic: Aorta normal caliber.  No adenopathy.   Reproductive: Uterus normal appearance. LEFT ovary normal appearance with resolution of large cyst seen on previous exam. 4.3 x 4.0 x  3.6 cm cyst arising from cranial aspect of RIGHT ovary, previously 4.1 x 3.2 x 4.0 cm; no follow-up imaging recommended.   Other: No free air or free fluid. Tiny supraumbilical ventral hernia containing fat. No definite inflammatory process.   Musculoskeletal: Unremarkable   IMPRESSION: Tiny nonobstructing LEFT renal calculus.   Mild diverticulosis of descending colon without evidence of diverticulitis.   Stable RIGHT ovarian cyst and resolution of previously seen LEFT ovarian cyst; no follow-up imaging recommended.   Tiny supraumbilical ventral hernia containing fat.   No acute intra-abdominal or intrapelvic abnormalities.     Electronically Signed   By: Ulyses Southward M.D.   On: 04/06/2022 18:35    Assessment & Plan:    1.  Left nephrolithiasis Stable Continue increased fluid intake One year follow up with KUB Call as needed for recurrent flank pain   [facility] [address] [place], [place] [address] [phone]

## 2022-10-30 NOTE — Patient Instructions (Incomplete)
Preventive Care 40-43 Years Old, Female Preventive care refers to lifestyle choices and visits with your health care provider that can promote health and wellness. Preventive care visits are also called wellness exams. What can I expect for my preventive care visit? Counseling Your health care provider may ask you questions about your: Medical history, including: Past medical problems. Family medical history. Pregnancy history. Current health, including: Menstrual cycle. Method of birth control. Emotional well-being. Home life and relationship well-being. Sexual activity and sexual health. Lifestyle, including: Alcohol, nicotine or tobacco, and drug use. Access to firearms. Diet, exercise, and sleep habits. Work and work environment. Sunscreen use. Safety issues such as seatbelt and bike helmet use. Physical exam Your health care provider will check your: Height and weight. These may be used to calculate your BMI (body mass index). BMI is a measurement that tells if you are at a healthy weight. Waist circumference. This measures the distance around your waistline. This measurement also tells if you are at a healthy weight and may help predict your risk of certain diseases, such as type 2 diabetes and high blood pressure. Heart rate and blood pressure. Body temperature. Skin for abnormal spots. What immunizations do I need?  Vaccines are usually given at various ages, according to a schedule. Your health care provider will recommend vaccines for you based on your age, medical history, and lifestyle or other factors, such as travel or where you work. What tests do I need? Screening Your health care provider may recommend screening tests for certain conditions. This may include: Lipid and cholesterol levels. Diabetes screening. This is done by checking your blood sugar (glucose) after you have not eaten for a while (fasting). Pelvic exam and Pap test. Hepatitis B test. Hepatitis C  test. HIV (human immunodeficiency virus) test. STI (sexually transmitted infection) testing, if you are at risk. Lung cancer screening. Colorectal cancer screening. Mammogram. Talk with your health care provider about when you should start having regular mammograms. This may depend on whether you have a family history of breast cancer. BRCA-related cancer screening. This may be done if you have a family history of breast, ovarian, tubal, or peritoneal cancers. Bone density scan. This is done to screen for osteoporosis. Talk with your health care provider about your test results, treatment options, and if necessary, the need for more tests. Follow these instructions at home: Eating and drinking  Eat a diet that includes fresh fruits and vegetables, whole grains, lean protein, and low-fat dairy products. Take vitamin and mineral supplements as recommended by your health care provider. Do not drink alcohol if: Your health care provider tells you not to drink. You are pregnant, may be pregnant, or are planning to become pregnant. If you drink alcohol: Limit how much you have to 0-1 drink a day. Know how much alcohol is in your drink. In the [place] one drink equals one 12 oz bottle of beer (355 mL), one 5 oz glass of wine (148 mL), or one 1 oz glass of hard liquor (44 mL). Lifestyle Brush your teeth every morning and night with fluoride toothpaste. Floss one time each day. Exercise for at least 30 minutes 5 or more days each week. Do not use any products that contain nicotine or tobacco. These products include cigarettes, chewing tobacco, and vaping devices, such as e-cigarettes. If you need help quitting, ask your health care provider. Do not use drugs. If you are sexually active, practice safe sex. Use a condom or other form of protection to   prevent STIs. If you do not wish to become pregnant, use a form of birth control. If you plan to become pregnant, see your health care provider for a  prepregnancy visit. Take aspirin only as told by your health care provider. Make sure that you understand how much to take and what form to take. Work with your health care provider to find out whether it is safe and beneficial for you to take aspirin daily. Find healthy ways to manage stress, such as: Meditation, yoga, or listening to music. Journaling. Talking to a trusted person. Spending time with friends and family. Minimize exposure to UV radiation to reduce your risk of skin cancer. Safety Always wear your seat belt while driving or riding in a vehicle. Do not drive: If you have been drinking alcohol. Do not ride with someone who has been drinking. When you are tired or distracted. While texting. If you have been using any mind-altering substances or drugs. Wear a helmet and other protective equipment during sports activities. If you have firearms in your house, make sure you follow all gun safety procedures. Seek help if you have been physically or sexually abused. What's next? Visit your health care provider once a year for an annual wellness visit. Ask your health care provider how often you should have your eyes and teeth checked. Stay up to date on all vaccines. This information is not intended to replace advice given to you by your health care provider. Make sure you discuss any questions you have with your health care provider. Document Revised: 12/14/2020 Document Reviewed: 12/14/2020 Elsevier Patient Education  2023 [employer]  

## 2022-10-31 ENCOUNTER — Ambulatory Visit (INDEPENDENT_AMBULATORY_CARE_PROVIDER_SITE_OTHER): Payer: Medicaid Other | Admitting: Family Medicine

## 2022-10-31 ENCOUNTER — Encounter: Payer: Medicaid Other | Admitting: Physician Assistant

## 2022-10-31 ENCOUNTER — Ambulatory Visit
Admission: RE | Admit: 2022-10-31 | Discharge: 2022-10-31 | Disposition: A | Discharge status: Home / Self Care | Payer: Medicaid Other | Source: Ambulatory Visit | Attending: Family Medicine | Admitting: Family Medicine

## 2022-10-31 ENCOUNTER — Encounter: Payer: Self-pay | Admitting: Family Medicine

## 2022-10-31 VITALS — BP 122/72 | HR 94 | Temp 97.7°F | Resp 16 | Ht 65.0 in | Wt 211.1 lb

## 2022-10-31 DIAGNOSIS — E559 Vitamin D deficiency, unspecified: Secondary | ICD-10-CM | POA: Diagnosis not present

## 2022-10-31 DIAGNOSIS — Z1231 Encounter for screening mammogram for malignant neoplasm of breast: Secondary | ICD-10-CM

## 2022-10-31 DIAGNOSIS — Z0000 Encounter for general adult medical examination without abnormal findings: Secondary | ICD-10-CM | POA: Diagnosis not present

## 2022-10-31 NOTE — Progress Notes (Unsigned)
Patient: Destiny Flores, Female    DOB: 13-Aug-1980, 42 y.o.   MRN: [id number] Danelle Berry, PA-C Visit Date: 10/31/2022  Today's Provider: Danelle Berry, PA-C   Chief Complaint  Patient presents with   Annual Exam   Subjective:   Annual physical exam:  Destiny Flores is a 42 y.o. female who presents today for complete physical exam:  Exercise/Activity:  no structured activity/exercise Diet/nutrition:  tries to eat healthy, trying to limit foods that may upset stomach - dairy Sleep:  no concerns - sometimes seizures interfere  SDOH Screenings   Food Insecurity: Food Insecurity Present (10/03/2022)  Housing: Low Risk  (10/03/2022)  Transportation Needs: No Transportation Needs (10/03/2022)  Utilities: Not At Risk (10/31/2022)  Alcohol Screen: Low Risk  (10/04/2022)  Depression (PHQ2-9): Medium Risk (10/31/2022)  Financial Resource Strain: High Risk (10/03/2022)  Physical Activity: Insufficiently Active (10/03/2022)  Social Connections: Moderately Integrated (10/03/2022)  Stress: No Stress Concern Present (10/03/2022)  Tobacco Use: Low Risk  (10/31/2022)    Abd pain continued since last OV, waiting for GI appt in several months  USPSTF grade A and B recommendations - reviewed and addressed today  Depression:  Phq 9 completed today by patient, was reviewed by me with patient in the room PHQ score is positive, pt feels overall stressed she's been sick a lot     10/31/2022   10:22 AM 10/04/2022    8:35 AM 09/10/2022    1:17 PM 06/19/2022    8:26 AM  PHQ 2/9 Scores  PHQ - 2 Score 2 0 0 2  PHQ- 9 Score 7  0 4      10/31/2022   10:22 AM 10/04/2022    8:35 AM 09/10/2022    1:17 PM 06/19/2022    8:26 AM 06/05/2022   10:13 AM  Depression screen PHQ 2/9  Decreased Interest 2 0 0 1 1  Down, Depressed, Hopeless 0 0 0 1 0  PHQ - 2 Score 2 0 0 2 1  Altered sleeping 2  0 1 0  Tired, decreased energy 2  0 1 0  Change in appetite 1  0 0 0  Feeling bad or failure about yourself  0  0 0 0  Trouble  concentrating 0  0 0 0  Moving slowly or fidgety/restless 0  0 0 0  Suicidal thoughts 0  0 0 0  PHQ-9 Score 7  0 4 1  Difficult doing work/chores Not difficult at all  Not difficult at all Somewhat difficult Not difficult at all    Alcohol screening: Flowsheet Row [facility]  AUDIT-C Score 0       Immunizations and Health Maintenance: Health Maintenance  Topic Date Due   MAMMOGRAM  08/04/2021   COVID-19 Vaccine (6 - 2023-24 season) 11/16/2022 (Originally 07/23/2022)   INFLUENZA VACCINE  01/31/2023   PAP SMEAR-Modifier  11/15/2024   DTaP/Tdap/Td (2 - Td or Tdap) 03/06/2028   Hepatitis C Screening  Completed   HIV Screening  Completed   HPV VACCINES  Aged Out     Hep C Screening:   STD testing and prevention (HIV/chl/gon/syphilis):  see above, no additional testing desired by pt today  Intimate partner violence:safe at home  Sexual History/Pain during Intercourse: Single  Menstrual History/LMP/Abnormal Bleeding:   regular lasting a little longer, starts light and then lasts 2 weeks Patient's last menstrual period was 10/16/2022 (approximate).  Incontinence Symptoms:  none  Breast cancer: done this  am Last Mammogram: *see HM list above   Cervical cancer screening: UTD Pt  family hx of cancers - breast, ovarian, uterine, colon:    Ovarian cyst   Osteoporosis:   Discussion on osteoporosis per age, including high calcium and vitamin D supplementation, weight bearing exercises  Prior low Vit D, then she was taking too high of Vit D, now off vit D supplements all together Last vitamin D Lab Results  Component Value Date   VD25OH 42 03/29/2022     Skin cancer:  Hx of skin CA -  NO - small spot on left knee she is worried about Discussed atypical lesions   Colorectal cancer:   Colonoscopy is not due per age, but going to see GI Discussed concerning signs and sx of CRC  Lung cancer:   Low Dose CT Chest  recommended if Age 93-80 years, 20 pack-year currently smoking OR have quit w/in 15years. Patient does not qualify.    Social History   Tobacco Use   Smoking status: Never   Smokeless tobacco: Never  Vaping Use   Vaping Use: Never used  Substance Use Topics   Alcohol use: Never   Drug use: Never     Flowsheet Row Office Visit from 10/04/2022 in [facility]  AUDIT-C Score 0       Family History  Problem Relation Age of Onset   Hyperlipidemia Father    Epilepsy Brother    ADD / ADHD Brother    Breast cancer Paternal Grandmother 57       Passed away at 79yo   Asthma Mother      Blood pressure/Hypertension: BP Readings from Last 3 Encounters:  10/31/22 122/72  10/26/22 108/77  10/23/22 120/88    Weight/Obesity: Wt Readings from Last 3 Encounters:  10/31/22 211 lb 1.6 oz (95.8 kg)  10/26/22 210 lb (95.3 kg)  10/04/22 216 lb 8 oz (98.2 kg)   BMI Readings from Last 3 Encounters:  10/31/22 35.13 kg/m  10/26/22 34.95 kg/m  10/04/22 36.03 kg/m     Lipids:  Lab Results  Component Value Date   CHOL 226 (H) 09/25/2021   CHOL 218 (H) 05/17/2020   CHOL 206 (H) 04/10/2019   Lab Results  Component Value Date   HDL 47 (L) 09/25/2021   HDL 54 05/17/2020   HDL 48 (L) 04/10/2019   Lab Results  Component Value Date   LDLCALC 150 (H) 09/25/2021   LDLCALC 141 (H) 05/17/2020   LDLCALC 132 (H) 04/10/2019   Lab Results  Component Value Date   TRIG 154 (H) 09/25/2021   TRIG 110 05/17/2020   TRIG 146 04/10/2019   Lab Results  Component Value Date   CHOLHDL 4.8 09/25/2021   CHOLHDL 4.0 05/17/2020   CHOLHDL 4.3 04/10/2019   No results found for: "LDLDIRECT" Based on the results of lipid panel his/her cardiovascular risk factor ( using Poole Cohort )  in the next 10 years is: The 10-year ASCVD risk score (Arnett DK, et al., 2019) is: 1%   Values used to calculate the score:     Age: 42 years     Sex: Female     Is Non-Hispanic African  American: No     Diabetic: No     Tobacco smoker: No     Systolic Blood Pressure: 122 mmHg     Is BP treated: No     HDL Cholesterol: 47 mg/dL     Total Cholesterol: 226 mg/dL  Glucose:  Glucose, Bld  Date Value Ref Range Status  09/30/2022 101 (H) 70 - 99 mg/dL Final    Comment:    Glucose reference range applies only to samples taken after fasting for at least 8 hours.  04/06/2022 92 70 - 99 mg/dL Final    Comment:    Glucose reference range applies only to samples taken after fasting for at least 8 hours.  03/29/2022 84 65 - 99 mg/dL Final    Comment:    .            Fasting reference interval .     Advanced Care Planning:  A voluntary discussion about advance care planning including the explanation and discussion of advance directives.   Discussed health care proxy and Living will, and the patient was able to identify a health care proxy as mother.   Patient does not have a living will at present time.   Social History       Social History   Socioeconomic History   Marital status: Single    Spouse name: Not on file   Number of children: 0   Years of education: 16   Highest education level: Bachelor's degree (e.g., BA, AB, BS)  Occupational History   Occupation: unemployeed  Tobacco Use   Smoking status: Never   Smokeless tobacco: Never  Vaping Use   Vaping Use: Never used  Substance and Sexual Activity   Alcohol use: Never   Drug use: Never   Sexual activity: Never    Partners: Male  Other Topics Concern   Not on file  Social History Narrative   Not on file   Social Determinants of Health   Financial Resource Strain: High Risk (10/03/2022)   Overall Financial Resource Strain (CARDIA)    Difficulty of Paying Living Expenses: Hard  Food Insecurity: Food Insecurity Present (10/03/2022)   Hunger Vital Sign    Worried About Running Out of Food in the Last Year: Often true    Ran Out of Food in the Last Year: Sometimes true  Transportation Needs: No  Transportation Needs (10/03/2022)   PRAPARE - Administrator, Civil Service (Medical): No    Lack of Transportation (Non-Medical): No  Physical Activity: Insufficiently Active (10/03/2022)   Exercise Vital Sign    Days of Exercise per Week: 3 days    Minutes of Exercise per Session: 10 min  Stress: No Stress Concern Present (10/03/2022)   [employer] of Occupational Health - Occupational Stress Questionnaire    Feeling of Stress : Only a little  Social Connections: Moderately Integrated (10/03/2022)   Social Connection and Isolation Panel [NHANES]    Frequency of Communication with Friends and Family: More than three times a week    Frequency of Social Gatherings with Friends and Family: Twice a week    Attends Religious Services: More than 4 times per year    Active Member of [employer] or Organizations: Yes    Attends Engineer, structural: More than 4 times per year    Marital Status: Never married    Family History        Family History  Problem Relation Age of Onset   Hyperlipidemia Father    Epilepsy Brother    ADD / ADHD Brother    Breast cancer Paternal Grandmother 12       Passed away at 73yo   Asthma Mother     Patient Active Problem List   Diagnosis Date Noted   Sprain  of right ankle 12/06/2021   History of kidney stones 10/17/2021   Allergic rhinitis 10/31/2020   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    AXILLARY HIDRADENITIS EXCISION     MULTIPLE  HYDRADENITIS SURGERIES IN [place]   hidradenitis groin Left 04/2017   last surgery done on groin and the symptoms have returned   HYDRADENITIS EXCISION N/A 01/16/2018   Procedure: EXCISION HIDRADENITIS GROIN;  Surgeon: Leafy Ro, MD;  Location: [facility] ORS;  Service: General;  Laterality: N/A;   KNEE SURGERY Left 2010   tendon and meniscus repair   NOSE SURGERY  1998   deviated septum and sinus repair   ROBOTIC ASSISTED LAPAROSCOPIC CHOLECYSTECTOMY N/A 07/24/2018   Procedure: ROBOTIC ASSISTED LAPAROSCOPIC CHOLECYSTECTOMY;  Surgeon: Leafy Ro, MD;  Location: [facility] ORS;  Service: General;  Laterality: N/A;   TONSILLECTOMY     TYMPANOSTOMY TUBE PLACEMENT     as a kid   VAGUS NERVE STIMULATOR GENERATOR CHANGE  03/2015     Current Outpatient Medications:    ascorbic acid (VITAMIN C) 1000 MG tablet, Take by mouth., Disp: , Rfl:    azelastine (ASTELIN) 0.1 % nasal spray, Place into the nose., Disp: , Rfl:    Azelastine HCl 137 MCG/SPRAY SOLN, PLACE 2 SPRAYS INTO BOTH NOSTRILS 2 (TWO) TIMES DAILY AS NEEDED FOR RHINITIS. USE IN EACH NOSTRIL AS DIRECTED, Disp: 2 mL, Rfl: 1   Brivaracetam (BRIVIACT) 100 MG TABS, Take 1 tablet by mouth 2 (two) times daily., Disp: , Rfl:    dicyclomine (BENTYL) 10 MG capsule, Take 1 capsule (10 mg total) by mouth 4 (four) times daily -  before meals and at bedtime., Disp: 30 capsule, Rfl: 0   felbamate (FELBATOL) 600 MG tablet, Take 600 mg by mouth 3 (three) times daily., Disp: , Rfl:    fluconazole (DIFLUCAN) 150 MG tablet, Take 1 tablet (150 mg total) by mouth every 3 (three) days as needed for up to 10 doses (for vaginal itching/yeast infection sx)., Disp: 10 tablet, Rfl: 0   fluconazole (DIFLUCAN) 150 MG tablet, Take 1 tablet (150 mg total) by mouth every 3 (three) days as needed (for vaginal itching/yeast infection sx)., Disp: 2 tablet, Rfl: 0   HUMIRA, 2 PEN, 80 MG/0.8ML PNKT, Inject into the skin., Disp: , Rfl:    HYDROcodone-acetaminophen  (NORCO/VICODIN) 5-325 MG tablet, Take 1 tablet by mouth every 4 (four) hours as needed for moderate pain., Disp: 12 tablet, Rfl: 0   levocetirizine (XYZAL) 5 MG tablet, TAKE 1 TABLET BY MOUTH EVERY DAY IN THE EVENING, Disp: 90 tablet, Rfl: 1   magnesium oxide (MAG-OX) 400 MG tablet, Take by mouth., Disp: , Rfl:    Melatonin 3 MG CAPS, Take by mouth., Disp: , Rfl:    metFORMIN (GLUCOPHAGE) 500 MG tablet, Take by mouth., Disp: , Rfl:    naproxen (NAPROSYN) 500 MG tablet, Take 1 tablet (500 mg total) by mouth 2 (two) times daily with a meal., Disp: 60 tablet, Rfl: 11   ondansetron (ZOFRAN-ODT) 4 MG disintegrating tablet, Take 1 tablet (  4 mg total) by mouth every 8 (eight) hours as needed., Disp: 20 tablet, Rfl: 0   pantoprazole (PROTONIX) 40 MG tablet, TAKE 1 TABLET BY MOUTH TWICE A DAY, Disp: 180 tablet, Rfl: 1   pyridOXINE (VITAMIN B-6) 100 MG tablet, Take 100 mg by mouth daily., Disp: , Rfl:    spironolactone (ALDACTONE) 100 MG tablet, Take by mouth., Disp: , Rfl:    topiramate (TOPAMAX) 25 MG tablet, Take 50 mg by mouth at bedtime., Disp: , Rfl:    TURMERIC CURCUMIN PO, Take 2 capsules by mouth daily., Disp: , Rfl:    docusate sodium (COLACE) 100 MG capsule, Take 1 capsule (100 mg total) by mouth every 12 (twelve) hours. (Patient not taking: Reported on 10/31/2022), Disp: 30 capsule, Rfl: 0   famotidine (PEPCID) 40 MG tablet, Take 0.5 tablets (20 mg total) by mouth 2 (two) times daily as needed for heartburn or indigestion. (Patient not taking: Reported on 10/31/2022), Disp: 180 tablet, Rfl: 1   loperamide (IMODIUM A-D) 2 MG tablet, Take 1 tablet (2 mg total) by mouth 4 (four) times daily as needed. (Patient not taking: Reported on 10/31/2022), Disp: 30 tablet, Rfl: 0   metroNIDAZOLE (FLAGYL) 500 MG tablet, Take 1 tablet (500 mg total) by mouth 2 (two) times daily. (Patient not taking: Reported on 10/31/2022), Disp: 14 tablet, Rfl: 0   predniSONE (DELTASONE) 20 MG tablet, Take 2 tablets daily with  breakfast. (Patient not taking: Reported on 10/31/2022), Disp: 10 tablet, Rfl: 0   SUMAtriptan (IMITREX) 50 MG tablet, Take 50 mg by mouth daily as needed., Disp: , Rfl:   Allergies  Allergen Reactions   Codeine Anaphylaxis   Penicillins Anaphylaxis and Other (See Comments)    Has patient had a PCN reaction causing immediate rash, facial/tongue/throat swelling, SOB or lightheadedness with hypotension: Yes Has patient had a PCN reaction causing severe rash involving mucus membranes or skin necrosis: No Has patient had a PCN reaction that required hospitalization: Yes Has patient had a PCN reaction occurring within the last 10 years: No If all of the above answers are "NO", then may proceed with Cephalosporin use.  Has patient had a PCN reaction causing immediate rash, facial/tongue/throat swelling, SOB or lightheadedness with hypotension: Yes Has patient had a PCN reaction causing severe rash involving mucus membranes or skin necrosis: No Has patient had a PCN reaction that required hospitalization: Yes Has patient had a PCN reaction occurring within the last 10 years: No If all of the above answers are "NO", then may proceed with Cephalosporin use. Has patient had a PCN reaction causing immediate rash, facial/tongue/throat swelling, SOB or lightheadedness with hypotension: Yes Has patient had a PCN reaction causing severe rash involving mucus membranes or skin necrosis: No Has patient had a PCN reaction that required hospitalization: Yes Has patient had a PCN reaction occurring within the last 10 years: No If all of the above answers are "NO", then may proceed with Cephalosporin use.   Sulfa Antibiotics Hives   Tape Other (See Comments)    Large Boils-PAPER TAPE OK TO USE Large Boils-PAPER TAPE OK TO USE    Patient Care Team: Danelle Berry, PA-C as PCP - General (Family Medicine)   Chart Review: I personally reviewed active problem list, medication list, allergies, family history,  social history, health maintenance, notes from last encounter, lab results, imaging with the patient/caregiver today.  Review of Systems  Constitutional: Negative.   HENT: Negative.    Eyes: Negative.   Respiratory: Negative.  Cardiovascular: Negative.   Gastrointestinal: Negative.   Endocrine: Negative.   Genitourinary: Negative.   Musculoskeletal: Negative.   Skin: Negative.   Allergic/Immunologic: Negative.   Neurological: Negative.   Hematological: Negative.   Psychiatric/Behavioral: Negative.    All other systems reviewed and are negative.         Objective:   Vitals:  Vitals:   10/31/22 1023  BP: 122/72  Pulse: 94  Resp: 16  Temp: 97.7 F (36.5 C)  TempSrc: Oral  SpO2: 98%  Weight: 211 lb 1.6 oz (95.8 kg)  Height: 5\' 5"  (1.651 m)    Body mass index is 35.13 kg/m.  Physical Exam Vitals and nursing note reviewed.  Constitutional:      General: She is not in acute distress.    Appearance: Normal appearance. She is well-developed. She is obese. She is not ill-appearing, toxic-appearing or diaphoretic.     Interventions: Face mask in place.  HENT:     Head: Normocephalic and atraumatic.     Right Ear: External ear normal.     Left Ear: External ear normal.  Eyes:     General: Lids are normal. No scleral icterus.       Right eye: No discharge.        Left eye: No discharge.     Conjunctiva/sclera: Conjunctivae normal.  Neck:     Trachea: Phonation normal. No tracheal deviation.  Cardiovascular:     Rate and Rhythm: Normal rate and regular rhythm.     Pulses: Normal pulses.          Radial pulses are 2+ on the right side and 2+ on the left side.       Posterior tibial pulses are 2+ on the right side and 2+ on the left side.     Heart sounds: Normal heart sounds. No murmur heard.    No friction rub. No gallop.  Pulmonary:     Effort: Pulmonary effort is normal. No respiratory distress.     Breath sounds: Normal breath sounds. No stridor. No wheezing,  rhonchi or rales.  Chest:     Chest wall: No tenderness.  Abdominal:     General: Bowel sounds are normal. There is no distension (generalized).     Palpations: Abdomen is soft.     Tenderness: There is abdominal tenderness.  Musculoskeletal:     Right lower leg: No edema.     Left lower leg: No edema.  Skin:    General: Skin is warm and dry.     Coloration: Skin is not jaundiced or pale.     Findings: No rash.  Neurological:     Mental Status: She is alert. Mental status is at baseline.     Motor: No abnormal muscle tone.     Gait: Gait normal.  Psychiatric:        Mood and Affect: Mood normal.        Speech: Speech normal.        Behavior: Behavior normal.       Fall Risk:    10/31/2022   10:22 AM 10/04/2022    8:34 AM 09/10/2022    1:17 PM 06/19/2022    8:25 AM 06/05/2022   10:09 AM  Fall Risk   Falls in the past year? 1 0 0 1 1  Number falls in past yr: 0 0 0 0 0  Injury with Fall? 1 0 0 1 1  Risk for fall due to : History of fall(s)  No Fall Risks Impaired balance/gait   Follow up Falls prevention discussed;Education provided;Falls evaluation completed  Falls prevention discussed;Education provided;Falls evaluation completed Falls prevention discussed;Education provided;Falls evaluation completed     Functional Status Survey: Is the patient deaf or have difficulty hearing?: No Does the patient have difficulty seeing, even when wearing glasses/contacts?: Yes Does the patient have difficulty concentrating, remembering, or making decisions?: No Does the patient have difficulty walking or climbing stairs?: No Does the patient have difficulty dressing or bathing?: No Does the patient have difficulty doing errands alone such as visiting a doctor's office or shopping?: No   Assessment & Plan:    CPE completed today  USPSTF grade A and B recommendations reviewed with patient; age-appropriate recommendations, preventive care, screening tests, etc discussed and  encouraged; healthy living encouraged; see AVS for patient education given to patient  Discussed importance of 150 minutes of physical activity weekly, AHA exercise recommendations given to pt in AVS/handout  Discussed importance of healthy diet:  eating lean meats and proteins, avoiding trans fats and saturated fats, avoid simple sugars and excessive carbs in diet, eat 6 servings of fruit/vegetables daily and drink plenty of water and avoid sweet beverages.    Recommended pt to do annual eye exam and routine dental exams/cleanings  Depression, alcohol, fall screening completed as documented above and per flowsheets  Advance Care planning information and packet discussed and offered today, encouraged pt to discuss with family members/spouse/partner/friends and complete Advanced directive packet and bring copy to office   Reviewed Health Maintenance: Health Maintenance  Topic Date Due   MAMMOGRAM  08/04/2021   COVID-19 Vaccine (6 - 2023-24 season) 11/16/2022 (Originally 07/23/2022)   INFLUENZA VACCINE  01/31/2023   PAP SMEAR-Modifier  11/15/2024   DTaP/Tdap/Td (2 - Td or Tdap) 03/06/2028   Hepatitis C Screening  Completed   HIV Screening  Completed   HPV VACCINES  Aged Out    Immunizations: Immunization History  Administered Date(s) Administered   Influenza,inj,Quad PF,6+ Mos 04/24/2017, 03/06/2018, 03/18/2019   Influenza-Unspecified 03/11/2018, 04/17/2018, 04/18/2020, 04/15/2021   Moderna Sars-Covid-2 Vaccination 09/22/2019, 10/20/2019, 06/08/2020, 04/15/2021, 05/28/2022   Pneumococcal Polysaccharide-23 06/09/2020   Tdap 03/06/2018   Vaccines:  HPV: up to at age 45 , ask insurance if age between 58-45  Shingrix: 51-64 yo and ask insurance if covered when patient above 53 yo Pneumonia:  done in 2021 - educated and discussed with patient. Flu:  educated and discussed with patient. COVID:      ICD-10-CM   1. Annual physical exam  Z00.00 CBC with Differential/Platelet     COMPLETE METABOLIC PANEL WITH GFR    Lipid panel    VITAMIN D 25 Hydroxy (Vit-D Deficiency, Fractures)    2. Vitamin D deficiency  E55.9 VITAMIN D 25 Hydroxy (Vit-D Deficiency, Fractures)          Danelle Berry, PA-C 10/31/22 10:45 AM  [facility]

## 2022-11-01 LAB — CBC WITH DIFFERENTIAL/PLATELET
Absolute Monocytes: 1164 cells/uL — ABNORMAL HIGH (ref 200–950)
Basophils Absolute: 90 cells/uL (ref 0–200)
Basophils Relative: 0.5 %
Eosinophils Absolute: 394 cells/uL (ref 15–500)
Eosinophils Relative: 2.2 %
HCT: 43.7 % (ref 35.0–45.0)
Hemoglobin: 14.6 g/dL (ref 11.7–15.5)
Lymphs Abs: 4815 cells/uL — ABNORMAL HIGH (ref 850–3900)
MCH: 29.7 pg (ref 27.0–33.0)
MCHC: 33.4 g/dL (ref 32.0–36.0)
MCV: 89 fL (ref 80.0–100.0)
MPV: 9.2 fL (ref 7.5–12.5)
Monocytes Relative: 6.5 %
Neutro Abs: 11438 cells/uL — ABNORMAL HIGH (ref 1500–7800)
Neutrophils Relative %: 63.9 %
Platelets: 588 10*3/uL — ABNORMAL HIGH (ref 140–400)
RBC: 4.91 10*6/uL (ref 3.80–5.10)
RDW: 12.4 % (ref 11.0–15.0)
Total Lymphocyte: 26.9 %
WBC: 17.9 10*3/uL — ABNORMAL HIGH (ref 3.8–10.8)

## 2022-11-01 LAB — COMPLETE METABOLIC PANEL WITH GFR
AG Ratio: 1.4 (calc) (ref 1.0–2.5)
ALT: 13 U/L (ref 6–29)
AST: 11 U/L (ref 10–30)
Albumin: 4.1 g/dL (ref 3.6–5.1)
Alkaline phosphatase (APISO): 89 U/L (ref 31–125)
BUN: 25 mg/dL (ref 7–25)
CO2: 26 mmol/L (ref 20–32)
Calcium: 10.2 mg/dL (ref 8.6–10.2)
Chloride: 98 mmol/L (ref 98–110)
Creat: 0.78 mg/dL (ref 0.50–0.99)
Globulin: 3 g/dL (calc) (ref 1.9–3.7)
Glucose, Bld: 77 mg/dL (ref 65–99)
Potassium: 4.8 mmol/L (ref 3.5–5.3)
Sodium: 135 mmol/L (ref 135–146)
Total Bilirubin: 0.4 mg/dL (ref 0.2–1.2)
Total Protein: 7.1 g/dL (ref 6.1–8.1)
eGFR: 97 mL/min/{1.73_m2} (ref >=60)

## 2022-11-01 LAB — LIPID PANEL
Cholesterol: 223 mg/dL — ABNORMAL HIGH (ref <200)
HDL: 62 mg/dL (ref >=50)
LDL Cholesterol (Calc): 135 mg/dL (calc) — ABNORMAL HIGH
Non-HDL Cholesterol (Calc): 161 mg/dL (calc) — ABNORMAL HIGH (ref <130)
Total CHOL/HDL Ratio: 3.6 (calc) (ref <5.0)
Triglycerides: 132 mg/dL (ref <150)

## 2022-11-01 LAB — VITAMIN D 25 HYDROXY (VIT D DEFICIENCY, FRACTURES): Vit D, 25-Hydroxy: 34 ng/mL (ref 30–100)

## 2022-11-01 NOTE — Unmapped (Signed)
Erica Norman 's HUMIRA(CF) PEN 80 mg/0.8 mL Pnkt (adalimumab) shipment will be canceled  as a result of prior authorization being required by the patient's insurance. (PA denied)    I have reached out to the patient  at [phone] and communicated the delay. We will not reschedule the medication and have removed this/these medication(s) from the work request.  We have canceled this work request.

## 2022-11-07 ENCOUNTER — Other Ambulatory Visit: Payer: Self-pay | Admitting: Family Medicine

## 2022-11-07 DIAGNOSIS — J329 Chronic sinusitis, unspecified: Secondary | ICD-10-CM

## 2022-11-08 DIAGNOSIS — L732 Hidradenitis suppurativa: Principal | ICD-10-CM

## 2022-11-08 MED ORDER — HUMIRA(CF) PEN 80 MG/0.8 ML SUBCUTANEOUS KIT
SUBCUTANEOUS | 1 refills | 28 days | Status: CP
Start: 2022-11-08 — End: 2023-01-03
  Filled 2022-11-12: qty 2, 28d supply, fill #0

## 2022-11-09 NOTE — Unmapped (Signed)
[facility] Clinical Intervention    Type of intervention: Recommended dose change    Medication involved: Humira    Problem identified: Patient's higher dose Humira (80 mg/week) was denied by insurance and patient has been without therapy. I messaged clinic for a prescription for standard dosing, which is approved.    Intervention performed: I called patient to counsel on the change - 80 mg every 14 days - at least until we hear the results of the appeal.    I scheduled a delivery for 5/13, same day courier, to prescription address.     Follow-up needed: yes, will follow up next month with patient.    Approximate time spent: 10-15 minutes    Clinical evidence used to support intervention: Professional judgement    Result of the intervention: Improved medication adherence    Mary Hockey A Desiree Lucy [facility] Specialty Pharmacist

## 2022-11-16 ENCOUNTER — Ambulatory Visit: Payer: Medicaid Other | Admitting: Family Medicine

## 2022-11-16 ENCOUNTER — Telehealth: Payer: Self-pay

## 2022-11-16 ENCOUNTER — Other Ambulatory Visit: Payer: Self-pay | Admitting: Family Medicine

## 2022-11-16 ENCOUNTER — Encounter: Payer: Self-pay | Admitting: Family Medicine

## 2022-11-16 VITALS — BP 102/64 | HR 98 | Temp 97.9°F | Resp 16 | Ht 65.0 in | Wt 215.3 lb

## 2022-11-16 DIAGNOSIS — Z79899 Other long term (current) drug therapy: Secondary | ICD-10-CM | POA: Diagnosis not present

## 2022-11-16 DIAGNOSIS — R109 Unspecified abdominal pain: Secondary | ICD-10-CM | POA: Diagnosis not present

## 2022-11-16 DIAGNOSIS — D84821 Immunodeficiency due to drugs: Secondary | ICD-10-CM

## 2022-11-16 DIAGNOSIS — L0291 Cutaneous abscess, unspecified: Secondary | ICD-10-CM

## 2022-11-16 DIAGNOSIS — K219 Gastro-esophageal reflux disease without esophagitis: Secondary | ICD-10-CM | POA: Diagnosis not present

## 2022-11-16 DIAGNOSIS — K529 Noninfective gastroenteritis and colitis, unspecified: Secondary | ICD-10-CM

## 2022-11-16 DIAGNOSIS — D75839 Thrombocytosis, unspecified: Secondary | ICD-10-CM

## 2022-11-16 DIAGNOSIS — D72829 Elevated white blood cell count, unspecified: Secondary | ICD-10-CM

## 2022-11-16 DIAGNOSIS — L732 Hidradenitis suppurativa: Secondary | ICD-10-CM

## 2022-11-16 MED ORDER — HYDROCODONE-ACETAMINOPHEN 5-325 MG PO TABS: 1.0000 | ORAL_TABLET | Freq: Four times a day (QID) | ORAL | 0 refills | Status: AC | PRN

## 2022-11-16 MED ORDER — CLINDAMYCIN HCL 300 MG PO CAPS: 300.0000 mg | ORAL_CAPSULE | Freq: Two times a day (BID) | ORAL | 0 refills | Status: DC

## 2022-11-16 NOTE — Progress Notes (Signed)
Patient ID: Destiny Flores, female    DOB: Apr 03, 1981, 42 y.o.   MRN: [id number]  PCP: Danelle Berry, PA-C  Chief Complaint  Patient presents with   Follow-up   Abdominal Pain    Still comes and goes not as bad when first time came in    Subjective:   Destiny Flores is a 42 y.o. female, presents to clinic with CC of the following:  HPI   Abd pain still present, last OV white count high, still waiting for GI, able to eat and drink, she has gained back some weight  Currently here for HS flare she has been out of her humira and then dosed too low with her humira which has led to flares, she currently has groin and buttock flares she cannot get into dermatology right now usually gets clindamycin and pain meds Abscess burst and started draining last night    Patient Active Problem List   Diagnosis Date Noted   Sprain of right ankle 12/06/2021   History of kidney stones 10/17/2021   Allergic rhinitis 10/31/2020   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 USE IN EACH NOSTRIL AS DIRECTED, Disp: 90 mL, Rfl: 1   Brivaracetam (BRIVIACT) 100 MG TABS, Take 1 tablet by mouth 2 (two) times daily., Disp: , Rfl:    dicyclomine (BENTYL) 10 MG capsule, Take 1 capsule (10 mg total) by mouth 4 (four) times daily -  before meals and at bedtime., Disp: 30 capsule, Rfl: 0   felbamate (FELBATOL) 600 MG tablet, Take  600 mg by mouth 3 (three) times daily., Disp: , Rfl:    fluconazole (DIFLUCAN) 150 MG tablet, Take 1 tablet (150 mg total) by mouth every 3 (three) days as needed for up to 10 doses (for vaginal itching/yeast infection sx)., Disp: 10 tablet, Rfl: 0   fluconazole (DIFLUCAN) 150 MG tablet, Take 1 tablet (150 mg total) by mouth every 3 (three) days as needed (for vaginal itching/yeast infection sx)., Disp: 2 tablet, Rfl: 0   HUMIRA, 2 PEN, 80 MG/0.8ML PNKT, Inject into the skin., Disp: , Rfl:    HYDROcodone-acetaminophen (NORCO/VICODIN) 5-325 MG tablet, Take 1 tablet by mouth every 4 (four) hours as needed for moderate pain., Disp: 12 tablet, Rfl: 0   levocetirizine (XYZAL) 5 MG tablet, TAKE 1 TABLET BY MOUTH EVERY DAY IN THE EVENING, Disp: 90 tablet, Rfl: 1   magnesium oxide (MAG-OX) 400 MG tablet, Take by mouth., Disp: , Rfl:    Melatonin 3 MG CAPS, Take by mouth., Disp: , Rfl:    metFORMIN (GLUCOPHAGE) 500 MG tablet, Take by mouth., Disp: , Rfl:    naproxen (NAPROSYN) 500 MG tablet, Take 1 tablet (500 mg total) by mouth 2 (two) times daily with a meal., Disp: 60 tablet, Rfl: 11   ondansetron (ZOFRAN-ODT) 4 MG disintegrating tablet, Take 1 tablet (4 mg total) by mouth every 8 (eight) hours as needed., Disp: 20  tablet, Rfl: 0   pantoprazole (PROTONIX) 40 MG tablet, TAKE 1 TABLET BY MOUTH TWICE A DAY, Disp: 180 tablet, Rfl: 1   pyridOXINE (VITAMIN B-6) 100 MG tablet, Take 100 mg by mouth daily., Disp: , Rfl:    spironolactone (ALDACTONE) 100 MG tablet, Take by mouth., Disp: , Rfl:    topiramate (TOPAMAX) 25 MG tablet, Take 50 mg by mouth at bedtime., Disp: , Rfl:    TURMERIC CURCUMIN PO, Take 2 capsules by mouth daily., Disp: , Rfl:    SUMAtriptan (IMITREX) 50 MG tablet, Take 50 mg by mouth daily as needed., Disp: , Rfl:    Allergies  Allergen Reactions   Codeine Anaphylaxis   Penicillins Anaphylaxis and Other (See Comments)    Has patient had a PCN reaction causing immediate rash,  facial/tongue/throat swelling, SOB or lightheadedness with hypotension: Yes Has patient had a PCN reaction causing severe rash involving mucus membranes or skin necrosis: No Has patient had a PCN reaction that required hospitalization: Yes Has patient had a PCN reaction occurring within the last 10 years: No If all of the above answers are "NO", then may proceed with Cephalosporin use.  Has patient had a PCN reaction causing immediate rash, facial/tongue/throat swelling, SOB or lightheadedness with hypotension: Yes Has patient had a PCN reaction causing severe rash involving mucus membranes or skin necrosis: No Has patient had a PCN reaction that required hospitalization: Yes Has patient had a PCN reaction occurring within the last 10 years: No If all of the above answers are "NO", then may proceed with Cephalosporin use. Has patient had a PCN reaction causing immediate rash, facial/tongue/throat swelling, SOB or lightheadedness with hypotension: Yes Has patient had a PCN reaction causing severe rash involving mucus membranes or skin necrosis: No Has patient had a PCN reaction that required hospitalization: Yes Has patient had a PCN reaction occurring within the last 10 years: No If all of the above answers are "NO", then may proceed with Cephalosporin use.   Sulfa Antibiotics Hives   Tape Other (See Comments)    Large Boils-PAPER TAPE OK TO USE Large Boils-PAPER TAPE OK TO USE     Social History   Tobacco Use   Smoking status: Never   Smokeless tobacco: Never  Vaping Use   Vaping Use: Never used  Substance Use Topics   Alcohol use: Never   Drug use: Never      Chart Review Today: I personally reviewed active problem list, medication list, allergies, family history, social history, health maintenance, notes from last encounter, lab results, imaging with the patient/caregiver today.   Review of Systems  Constitutional: Negative.   HENT: Negative.    Eyes: Negative.    Respiratory: Negative.    Cardiovascular: Negative.   Gastrointestinal: Negative.   Endocrine: Negative.   Genitourinary: Negative.   Musculoskeletal: Negative.   Skin: Negative.   Allergic/Immunologic: Negative.   Neurological: Negative.   Hematological: Negative.   Psychiatric/Behavioral: Negative.    All other systems reviewed and are negative.      Objective:   Vitals:   11/16/22 0920  BP: 102/64  Pulse: 98  Resp: 16  Temp: 97.9 F (36.6 C)  TempSrc: Oral  SpO2: 98%  Weight: 215 lb 4.8 oz (97.7 kg)  Height: 5\' 5"  (1.651 m)    Body mass index is 35.83 kg/m.  Physical Exam Vitals and nursing note reviewed.  Constitutional:      General: She is not in acute distress.    Appearance: Normal  appearance. She is well-developed. She is obese. She is not ill-appearing, toxic-appearing or diaphoretic.     Interventions: Face mask in place.     Comments: Appears uncomfortable, non-toxic  HENT:     Head: Normocephalic and atraumatic.     Right Ear: External ear normal.     Left Ear: External ear normal.  Eyes:     General: Lids are normal. No scleral icterus.       Right eye: No discharge.        Left eye: No discharge.     Conjunctiva/sclera: Conjunctivae normal.  Neck:     Trachea: Phonation normal. No tracheal deviation.  Cardiovascular:     Rate and Rhythm: Normal rate and regular rhythm.     Pulses: Normal pulses.          Radial pulses are 2+ on the right side and 2+ on the left side.       Posterior tibial pulses are 2+ on the right side and 2+ on the left side.     Heart sounds: Normal heart sounds. No murmur heard.    No friction rub. No gallop.  Pulmonary:     Effort: Pulmonary effort is normal. No respiratory distress.     Breath sounds: Normal breath sounds. No stridor. No wheezing, rhonchi or rales.  Chest:     Chest wall: No tenderness.  Musculoskeletal:     Right lower leg: No edema.     Left lower leg: No edema.  Skin:    General: Skin is  warm and dry.     Coloration: Skin is not jaundiced or pale.     Findings: No rash.  Neurological:     Mental Status: She is alert.     Motor: No abnormal muscle tone.     Gait: Gait normal.  Psychiatric:        Mood and Affect: Mood normal.        Speech: Speech normal.        Behavior: Behavior normal.      Results for orders placed or performed in visit on 10/31/22  CBC with Differential/Platelet  Result Value Ref Range   WBC 17.9 (H) 3.8 - 10.8 Thousand/uL   RBC 4.91 3.80 - 5.10 Million/uL   Hemoglobin 14.6 11.7 - 15.5 g/dL   HCT 95.2 84.1 - 32.4 %   MCV 89.0 80.0 - 100.0 fL   MCH 29.7 27.0 - 33.0 pg   MCHC 33.4 32.0 - 36.0 g/dL   RDW 40.1 02.7 - 25.3 %   Platelets 588 (H) 140 - 400 Thousand/uL   MPV 9.2 7.5 - 12.5 fL   Neutro Abs 11,438 (H) 1,500 - 7,800 cells/uL   Lymphs Abs 4,815 (H) 850 - 3,900 cells/uL   Absolute Monocytes 1,164 (H) 200 - 950 cells/uL   Eosinophils Absolute 394 15 - 500 cells/uL   Basophils Absolute 90 0 - 200 cells/uL   Neutrophils Relative % 63.9 %   Total Lymphocyte 26.9 %   Monocytes Relative 6.5 %   Eosinophils Relative 2.2 %   Basophils Relative 0.5 %  COMPLETE METABOLIC PANEL WITH GFR  Result Value Ref Range   Glucose, Bld 77 65 - 99 mg/dL   BUN 25 7 - 25 mg/dL   Creat 6.64 4.03 - 4.74 mg/dL   eGFR 97 > OR = 60 QV/ZDG/3.87F6   BUN/Creatinine Ratio SEE NOTE: 6 - 22 (calc)   Sodium 135 135 - 146 mmol/L   Potassium 4.8  3.5 - 5.3 mmol/L   Chloride 98 98 - 110 mmol/L   CO2 26 20 - 32 mmol/L   Calcium 10.2 8.6 - 10.2 mg/dL   Total Protein 7.1 6.1 - 8.1 g/dL   Albumin 4.1 3.6 - 5.1 g/dL   Globulin 3.0 1.9 - 3.7 g/dL (calc)   AG Ratio 1.4 1.0 - 2.5 (calc)   Total Bilirubin 0.4 0.2 - 1.2 mg/dL   Alkaline phosphatase (APISO) 89 31 - 125 U/L   AST 11 10 - 30 U/L   ALT 13 6 - 29 U/L  Lipid panel  Result Value Ref Range   Cholesterol 223 (H) <200 mg/dL   HDL 62 > OR = 50 mg/dL   Triglycerides 161 <096 mg/dL   LDL Cholesterol (Calc) 135  (H) mg/dL (calc)   Total CHOL/HDL Ratio 3.6 <5.0 (calc)   Non-HDL Cholesterol (Calc) 161 (H) <130 mg/dL (calc)  VITAMIN D 25 Hydroxy (Vit-D Deficiency, Fractures)  Result Value Ref Range   Vit D, 25-Hydroxy 34 30 - 100 ng/mL       Assessment & Plan:     ICD-10-CM   1. Hidradenitis suppurativa  L73.2 clindamycin (CLEOCIN) 300 MG capsule    HYDROcodone-acetaminophen (NORCO/VICODIN) 5-325 MG tablet   HS flares due to undermanagement with dosing and not getting her meds due to insurance BS, flare tx with clinda and pain meds    2. Abscess  L02.91 clindamycin (CLEOCIN) 300 MG capsule   groin and buttock, started draining last night, abx per derm protocol for HS flare, f/up here or derm if not improving    3. Immunosuppression due to drug therapy (HCC)  D84.821 CBC with Differential/Platelet   Z79.899 Pathologist smear review    4. Enterocolitis  K52.9     5. Gastroesophageal reflux disease without esophagitis  K21.9     6. Abdominal pain, unspecified abdominal location  R10.9    discussed threshold for going to ER for f/up, prior CT, labs, ER and OV work up, she is waiting for GI appt, currently able to eat and gaining back some weight    7. Leukocytosis, unspecified type  D72.829 CBC with Differential/Platelet    Pathologist smear review   HS flares, abd pain, changes in humira meds off and on, hx of high WBCs and platelets, recheck labs in about 2 weeks    8. Thrombocytosis  D75.839 CBC with Differential/Platelet    Pathologist smear review      Labs ordered - can do after abx tx and about 1 week of feeling back at baseline - would then do f/up CBC, blood smear added    Danelle Berry, PA-C 11/16/22 9:32 AM

## 2022-11-16 NOTE — Telephone Encounter (Signed)
Diu-ha, [facility] with [facility] calling to get clarification on Clindamycin. She wanted to make sure pt had been given directions since qty was more than directions recommended. Advised that pt had OV today and has been instructed on directions for flare ups. No further assistance needed.

## 2022-12-03 ENCOUNTER — Encounter: Payer: Self-pay | Admitting: Family Medicine

## 2022-12-03 ENCOUNTER — Ambulatory Visit: Payer: Medicaid Other | Admitting: Family Medicine

## 2022-12-03 VITALS — BP 98/64 | HR 74 | Temp 97.7°F | Resp 16 | Ht 65.0 in | Wt 220.4 lb

## 2022-12-03 DIAGNOSIS — D75839 Thrombocytosis, unspecified: Secondary | ICD-10-CM

## 2022-12-03 DIAGNOSIS — K219 Gastro-esophageal reflux disease without esophagitis: Secondary | ICD-10-CM | POA: Diagnosis not present

## 2022-12-03 DIAGNOSIS — R109 Unspecified abdominal pain: Secondary | ICD-10-CM

## 2022-12-03 DIAGNOSIS — K529 Noninfective gastroenteritis and colitis, unspecified: Secondary | ICD-10-CM

## 2022-12-03 DIAGNOSIS — D72829 Elevated white blood cell count, unspecified: Secondary | ICD-10-CM

## 2022-12-03 DIAGNOSIS — F419 Anxiety disorder, unspecified: Secondary | ICD-10-CM | POA: Diagnosis not present

## 2022-12-03 DIAGNOSIS — L0291 Cutaneous abscess, unspecified: Secondary | ICD-10-CM | POA: Diagnosis not present

## 2022-12-03 MED ORDER — DIAZEPAM 5 MG PO TABS
5.0000 mg | ORAL_TABLET | Freq: Two times a day (BID) | ORAL | 0 refills | Status: DC | PRN
Start: 2022-12-03 — End: 2023-07-05

## 2022-12-03 NOTE — Progress Notes (Signed)
Patient ID: Destiny Flores, female    DOB: 1980/10/09, 42 y.o.   MRN: [id number]  PCP: Danelle Berry, PA-C  Chief Complaint  Patient presents with   Follow-up    Doing well antibiotic helped    Subjective:   Destiny Flores is a 42 y.o. female, presents to clinic with CC of the following:  HPI  F/up on HS flare and on abnormal CBC  WBC ws 17.9, platelets 588, elevated neut, lymph monocytes - with acute infection and biologic meds, hx of thrombocytosis but higher than baseline, was due for recheck of labs after completing abx and an additional week or so improved  Abd pain pending GI consult ER visits with prior enterocolitis vs malabsorption, s/p cholecystectomy, and hx of kidney stones Working on diet, fat free, avoiding milk - bowels are back to normal, formed stools/BM usually once a day or every other Cholecystectomy 2020 - no sx like this right after surgery She does have more urgent loose stool with milk and fatty meals Overall her GI sx have improved but she still wants to be seen by GI for f/up, hx of similar episodes since a child with diarrhea/abdominal pain, sometimes bloody diarrhea with acute illness or rxn to certain foods causing malabsorption and she would like to be checked  Doing surgery/procedure with dermatologist for HS abscess to groin and buttock- usually needs valium prior to procedure On metformin 500 mg, trying to add second dose Last vitamin D Lab Results  Component Value Date   VD25OH 34 10/31/2022      Patient Active Problem List   Diagnosis Date Noted   Sprain of right ankle 12/06/2021   History of kidney stones 10/17/2021   Allergic rhinitis 10/31/2020   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 USE IN EACH NOSTRIL AS DIRECTED, Disp: 90 mL, Rfl: 1   Brivaracetam (BRIVIACT) 100 MG TABS, Take 1 tablet by mouth 2 (two) times daily., Disp: , Rfl:    clindamycin (CLEOCIN) 300 MG capsule, Take 1 capsule (300 mg total) by mouth 2 (two) times daily. Take for 7 d for HS flares, Disp: 60 capsule, Rfl: 0   dicyclomine (BENTYL) 10 MG capsule, Take 1 capsule (10 mg total) by mouth 4 (four) times daily -  before meals and at bedtime., Disp: 30 capsule, Rfl: 0   felbamate (FELBATOL) 600 MG tablet, Take 600 mg by mouth 3 (three) times daily., Disp: , Rfl:    fluconazole (DIFLUCAN) 150 MG tablet, Take 1 tablet (150 mg total) by mouth every 3 (three) days as needed for up to 10 doses (for vaginal itching/yeast infection sx)., Disp: 10 tablet, Rfl: 0   fluconazole (DIFLUCAN) 150 MG tablet, Take 1 tablet (150 mg total) by mouth every 3 (three) days as needed (for vaginal itching/yeast infection sx)., Disp: 2 tablet, Rfl: 0   HUMIRA, 2 PEN, 80 MG/0.8ML PNKT, Inject into the skin., Disp: ,  Rfl:    HYDROcodone-acetaminophen (NORCO/VICODIN) 5-325 MG tablet, Take 1 tablet by mouth every 4 (four) hours as needed for moderate pain., Disp: 12 tablet, Rfl: 0   levocetirizine (XYZAL) 5 MG tablet, TAKE 1 TABLET BY MOUTH EVERY DAY IN THE EVENING, Disp: 90 tablet, Rfl: 1   magnesium oxide (MAG-OX) 400 MG tablet, Take by mouth., Disp: , Rfl:    Melatonin 3 MG CAPS, Take by mouth., Disp: , Rfl:    metFORMIN (GLUCOPHAGE) 500 MG tablet, Take by mouth., Disp: , Rfl:    ondansetron (ZOFRAN-ODT) 4 MG disintegrating tablet, Take 1 tablet (4 mg total) by mouth every 8 (eight) hours as needed.,  Disp: 20 tablet, Rfl: 0   pantoprazole (PROTONIX) 40 MG tablet, TAKE 1 TABLET BY MOUTH TWICE A DAY, Disp: 180 tablet, Rfl: 1   pyridOXINE (VITAMIN B-6) 100 MG tablet, Take 100 mg by mouth daily., Disp: , Rfl:    spironolactone (ALDACTONE) 100 MG tablet, Take by mouth., Disp: , Rfl:    topiramate (TOPAMAX) 25 MG tablet, Take 50 mg by mouth at bedtime., Disp: , Rfl:    TURMERIC CURCUMIN PO, Take 2 capsules by mouth daily., Disp: , Rfl:    SUMAtriptan (IMITREX) 50 MG tablet, Take 50 mg by mouth daily as needed., Disp: , Rfl:    Allergies  Allergen Reactions   Codeine Anaphylaxis   Penicillins Anaphylaxis and Other (See Comments)    Has patient had a PCN reaction causing immediate rash, facial/tongue/throat swelling, SOB or lightheadedness with hypotension: Yes Has patient had a PCN reaction causing severe rash involving mucus membranes or skin necrosis: No Has patient had a PCN reaction that required hospitalization: Yes Has patient had a PCN reaction occurring within the last 10 years: No If all of the above answers are "NO", then may proceed with Cephalosporin use.  Has patient had a PCN reaction causing immediate rash, facial/tongue/throat swelling, SOB or lightheadedness with hypotension: Yes Has patient had a PCN reaction causing severe rash involving mucus membranes or skin necrosis: No Has patient had a PCN reaction that required hospitalization: Yes Has patient had a PCN reaction occurring within the last 10 years: No If all of the above answers are "NO", then may proceed with Cephalosporin use. Has patient had a PCN reaction causing immediate rash, facial/tongue/throat swelling, SOB or lightheadedness with hypotension: Yes Has patient had a PCN reaction causing severe rash involving mucus membranes or skin necrosis: No Has patient had a PCN reaction that required hospitalization: Yes Has patient had a PCN reaction occurring within the last 10 years: No If all of the above answers  are "NO", then may proceed with Cephalosporin use.   Sulfa Antibiotics Hives   Tape Other (See Comments)    Large Boils-PAPER TAPE OK TO USE Large Boils-PAPER TAPE OK TO USE     Social History   Tobacco Use   Smoking status: Never   Smokeless tobacco: Never  Vaping Use   Vaping Use: Never used  Substance Use Topics   Alcohol use: Never   Drug use: Never      Chart Review Today: I personally reviewed active problem list, medication list, allergies, family history, social history, health maintenance, notes from last encounter, lab results, imaging with the patient/caregiver today.   Review of Systems  Constitutional: Negative.   HENT: Negative.    Eyes: Negative.   Respiratory: Negative.    Cardiovascular: Negative.   Gastrointestinal: Negative.   Endocrine: Negative.   Genitourinary: Negative.  Musculoskeletal: Negative.   Skin: Negative.   Allergic/Immunologic: Negative.   Neurological: Negative.   Hematological: Negative.   Psychiatric/Behavioral: Negative.    All other systems reviewed and are negative.      Objective:   Vitals:   12/03/22 0912  BP: 98/64  Pulse: 74  Resp: 16  Temp: 97.7 F (36.5 C)  TempSrc: Oral  SpO2: 98%  Weight: 220 lb 6.4 oz (100 kg)  Height: 5\' 5"  (1.651 m)    Body mass index is 36.68 kg/m.  Physical Exam Vitals and nursing note reviewed.  Constitutional:      General: She is not in acute distress.    Appearance: Normal appearance. She is well-developed. She is obese. She is not ill-appearing, toxic-appearing or diaphoretic.  HENT:     Head: Normocephalic and atraumatic.     Nose: Nose normal.  Eyes:     General:        Right eye: No discharge.        Left eye: No discharge.     Conjunctiva/sclera: Conjunctivae normal.  Neck:     Trachea: No tracheal deviation.  Cardiovascular:     Rate and Rhythm: Normal rate and regular rhythm.     Pulses: Normal pulses.     Heart sounds: Normal heart sounds.  Pulmonary:      Effort: Pulmonary effort is normal. No respiratory distress.     Breath sounds: Normal breath sounds. No stridor.  Abdominal:     General: Bowel sounds are normal. There is no distension.     Palpations: Abdomen is soft.  Skin:    General: Skin is warm and dry.     Findings: No rash.  Neurological:     Mental Status: She is alert. Mental status is at baseline.     Motor: No abnormal muscle tone.     Coordination: Coordination normal.  Psychiatric:        Mood and Affect: Mood normal.        Behavior: Behavior normal.      Results for orders placed or performed in visit on 10/31/22  CBC with Differential/Platelet  Result Value Ref Range   WBC 17.9 (H) 3.8 - 10.8 Thousand/uL   RBC 4.91 3.80 - 5.10 Million/uL   Hemoglobin 14.6 11.7 - 15.5 g/dL   HCT 16.1 09.6 - 04.5 %   MCV 89.0 80.0 - 100.0 fL   MCH 29.7 27.0 - 33.0 pg   MCHC 33.4 32.0 - 36.0 g/dL   RDW 40.9 81.1 - 91.4 %   Platelets 588 (H) 140 - 400 Thousand/uL   MPV 9.2 7.5 - 12.5 fL   Neutro Abs 11,438 (H) 1,500 - 7,800 cells/uL   Lymphs Abs 4,815 (H) 850 - 3,900 cells/uL   Absolute Monocytes 1,164 (H) 200 - 950 cells/uL   Eosinophils Absolute 394 15 - 500 cells/uL   Basophils Absolute 90 0 - 200 cells/uL   Neutrophils Relative % 63.9 %   Total Lymphocyte 26.9 %   Monocytes Relative 6.5 %   Eosinophils Relative 2.2 %   Basophils Relative 0.5 %  COMPLETE METABOLIC PANEL WITH GFR  Result Value Ref Range   Glucose, Bld 77 65 - 99 mg/dL   BUN 25 7 - 25 mg/dL   Creat 7.82 9.56 - 2.13 mg/dL   eGFR 97 > OR = 60 YQ/MVH/8.46N6   BUN/Creatinine Ratio SEE NOTE: 6 - 22 (calc)   Sodium 135 135 - 146 mmol/L   Potassium 4.8 3.5 -  5.3 mmol/L   Chloride 98 98 - 110 mmol/L   CO2 26 20 - 32 mmol/L   Calcium 10.2 8.6 - 10.2 mg/dL   Total Protein 7.1 6.1 - 8.1 g/dL   Albumin 4.1 3.6 - 5.1 g/dL   Globulin 3.0 1.9 - 3.7 g/dL (calc)   AG Ratio 1.4 1.0 - 2.5 (calc)   Total Bilirubin 0.4 0.2 - 1.2 mg/dL   Alkaline phosphatase  (APISO) 89 31 - 125 U/L   AST 11 10 - 30 U/L   ALT 13 6 - 29 U/L  Lipid panel  Result Value Ref Range   Cholesterol 223 (H) <200 mg/dL   HDL 62 > OR = 50 mg/dL   Triglycerides 604 <540 mg/dL   LDL Cholesterol (Calc) 135 (H) mg/dL (calc)   Total CHOL/HDL Ratio 3.6 <5.0 (calc)   Non-HDL Cholesterol (Calc) 161 (H) <130 mg/dL (calc)  VITAMIN D 25 Hydroxy (Vit-D Deficiency, Fractures)  Result Value Ref Range   Vit D, 25-Hydroxy 34 30 - 100 ng/mL       Assessment & Plan:    1. 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  Dispense: 1 tablet; Refill: 0  3. Thrombocytosis [place], [place] previously declined hem/onc consult Platelets remain very elevated  Lab Results  Component Value Date   PLT 588 (H) 10/31/2022   PLT 506 (H) 09/30/2022   PLT 578 (H) 04/06/2022   PLT 527 (H) 03/29/2022   PLT 475 (H) 10/21/2021   PLT 517 (H) 10/16/2021   PLT 544 (H) 09/25/2021   PLT 541 (H) 08/17/2020   PLT 538 (H) 05/17/2020   PLT 470 (H) 06/05/2018     4. Abscess Flare has calmed down - f/up surgery/procedure tomorrow with dermatologist  5. Enterocolitis Per last CT and sx for many months this year, BM have recently started to improve, less abd pain, more formed stool  6. Gastroesophageal reflux disease without  esophagitis Currently sx well controlled Severe GERD and GI sx for the past 2 years, needing PPI sometimes BID dosing, pepcid   7. Abdominal pain, unspecified abdominal location Better today than last couple OV She has f/up appt with GI   Return for Nov routine f/up appt.    Danelle Berry, PA-C 12/03/22 9:26 AM

## 2022-12-04 ENCOUNTER — Ambulatory Visit: Admit: 2022-12-04 | Discharge: 2022-12-05 | Payer: BLUE CROSS/BLUE SHIELD

## 2022-12-04 DIAGNOSIS — L732 Hidradenitis suppurativa: Principal | ICD-10-CM

## 2022-12-04 LAB — CBC WITH DIFFERENTIAL/PLATELET
Absolute Monocytes: 744 cells/uL (ref 200–950)
Basophils Absolute: 84 cells/uL (ref 0–200)
Basophils Relative: 0.7 %
Eosinophils Absolute: 276 cells/uL (ref 15–500)
Eosinophils Relative: 2.3 %
HCT: 37.4 % (ref 35.0–45.0)
Hemoglobin: 12.4 g/dL (ref 11.7–15.5)
Lymphs Abs: 3792 cells/uL (ref 850–3900)
MCH: 30 pg (ref 27.0–33.0)
MCHC: 33.2 g/dL (ref 32.0–36.0)
MCV: 90.6 fL (ref 80.0–100.0)
MPV: 9.5 fL (ref 7.5–12.5)
Monocytes Relative: 6.2 %
Neutro Abs: 7104 cells/uL (ref 1500–7800)
Neutrophils Relative %: 59.2 %
Platelets: 529 10*3/uL — ABNORMAL HIGH (ref 140–400)
RBC: 4.13 10*6/uL (ref 3.80–5.10)
RDW: 13.5 % (ref 11.0–15.0)
Total Lymphocyte: 31.6 %
WBC: 12 10*3/uL — ABNORMAL HIGH (ref 3.8–10.8)

## 2022-12-04 LAB — PATHOLOGIST SMEAR REVIEW

## 2022-12-04 MED ORDER — HYDROCODONE 5 MG-ACETAMINOPHEN 325 MG TABLET
ORAL_TABLET | Freq: Four times a day (QID) | ORAL | 0 refills | 2.00000 days | Status: CP | PRN
Start: 2022-12-04 — End: ?

## 2022-12-04 NOTE — Unmapped (Addendum)
Unroofing procedure    To care for the area: Leave the bandage in place until the morning after your procedure is performed. On a daily basis, carefully remove the bandage, then shower or wash as usual. Allow water to run over the site. Please do not scrub. Carefully dry the area, then apply ointment (some people develop an allergy to Neosporin, so we recommend Vaseline). Cover the site with a fresh bandage. Should any bleeding occur, apply firm pressure for 15 minutes. The treated site will heal best if  a scab never forms (the wound heals by new skin cells traveling from the outside toward the middle-their journey is easier if no scab stands in their way).    Long-term care: the site will be more sensitive than your surrounding skin. Keep it covered, and remember to apply sunscreen every day to all your exposed skin. A scar may remain which is lighter or pinker than your normal skin. Your body will continue to improve your scar for up to one year.    Infection following this procedure is rare. However, if you are worried about the appearance of your site, contact your doctor. Complete healing of a site may take up to one month or 6 weeks. We have a physician on call at all times. If you have any concerns about the site, please call our clinic at [phone].

## 2022-12-04 NOTE — Unmapped (Signed)
Simple unroofing procedure for hidradenitis supprativa in the L perianal area, R perianal area, and L labia majora (partial labial removal):  Pre-procedure:  Patient Verification with name and date of birth was performed. Site and procedure verified with patient/physician agreement and clinical photograph. Two Health Care Workers involved in verification.  Personnel present during procedure: Dossie Arbour MD, Jarome Matin MD, Patsy Lager . The size and nature of the procedure as well as typical scarring were discussed along with (among others) the risks of bleeding, infection and lesion recurrence.  Unroofing procedure:  Lidocaine 0.5% with epinephrine was infiltrated at the site with a total of 40 mL. The surgical site was marked, cleansed with chlorhexidine, and draped in a sterile fashion. The fistula probe and/or forceps was used to delineate the involved area. Excision and beveling was performed with scissor or blade as appropriate to assure second intention healing.  Hemostasis was obtained in the usual fashion with pressure and drysol solution.     Total size of wound:     L labia majora crease: 2 x 1.8 cm.   L perianal 2 x 2.1 cm.   R perianal 2.4 x 2.2 cm.     The areas were dressed with petrolatum and bandage. Wound care instructions given.      The patient was advised to call for an appointment should any new, changing, or symptomatic lesions develop.     RTC: Return for Next scheduled follow up. or sooner as needed

## 2022-12-05 NOTE — Unmapped (Signed)
I saw and evaluated the patient, participating in the key elements of the service.  I discussed the findings, assessment and plan with the resident and agree with resident’s findings and plan as documented in the resident's note.  I was immediately available for the entirety of the procedure(s) and present for the key and critical portions. Yoshiko Keleher J Dajon Rowe, MD

## 2022-12-06 NOTE — Unmapped (Signed)
Path reviewed. C/w HS    Diagnosis       Date                     Value               Ref Range           Status                12/04/2022                                                       Final             Bilateral inguinal crease, excision -Consistent with hidradenitis suppurativa in a compatible clinical  context. Specimen type: Excision Cystically dilated follicles: Present Folliculitis: Not identified  Abscess: Not identified  Chronic inflammation: Moderate  Lymphocytes: Moderate  Plasma cells: Moderate  Foreign body histiocytes: Not identified  Sinus tract granulation tissue: Present Sinus tract lined by squamous epithelium: Present Fibrosis: Present  Hypertrophic scar: Not identified   Keloid: Not identified  Granulomas (sarcoid - like): Not identified  Hidradenitis - neutrophils in apocrine / eccrine glands: Not identified  Epidermal hyperplasia: Present [FORMATTING REMOVED]  ----------        Informed patient of benign results via mychart    Tracking: No

## 2022-12-23 ENCOUNTER — Encounter: Payer: Self-pay | Admitting: Family Medicine

## 2022-12-24 ENCOUNTER — Other Ambulatory Visit: Payer: Self-pay

## 2022-12-24 DIAGNOSIS — K219 Gastro-esophageal reflux disease without esophagitis: Secondary | ICD-10-CM

## 2022-12-24 MED ORDER — PANTOPRAZOLE SODIUM 40 MG PO TBEC
40.0000 mg | DELAYED_RELEASE_TABLET | Freq: Two times a day (BID) | ORAL | 1 refills | Status: DC
Start: 2022-12-24 — End: 2023-07-05

## 2023-01-16 ENCOUNTER — Ambulatory Visit: Payer: Medicaid Other | Admitting: Family Medicine

## 2023-01-17 ENCOUNTER — Encounter: Payer: Self-pay | Admitting: Family Medicine

## 2023-01-17 ENCOUNTER — Ambulatory Visit: Payer: Medicaid Other | Admitting: Family Medicine

## 2023-01-17 VITALS — BP 102/64 | HR 92 | Temp 98.0°F | Resp 16 | Ht 65.0 in | Wt 222.7 lb

## 2023-01-17 DIAGNOSIS — L989 Disorder of the skin and subcutaneous tissue, unspecified: Secondary | ICD-10-CM

## 2023-01-17 MED ORDER — DOXYCYCLINE HYCLATE 100 MG PO TABS: 100.0000 mg | ORAL_TABLET | Freq: Two times a day (BID) | ORAL | 0 refills | Status: AC

## 2023-01-17 NOTE — Progress Notes (Signed)
Patient ID: Destiny Flores, female    DOB: 12/15/1980, 42 y.o.   MRN: [id number]  PCP: Destiny Berry, PA-C  Chief Complaint  Patient presents with   Recurrent Skin Infections    Possible boil/cyst on left buttock onset for 2-3 weeks has got bigger and bothers    Subjective:   Destiny Flores is a 42 y.o. 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 USE IN EACH NOSTRIL AS DIRECTED, Disp: 90 mL, Rfl: 1   Brivaracetam (BRIVIACT) 100 MG TABS, Take 1 tablet by mouth 2 (two) times daily., Disp: , Rfl:    clindamycin (CLEOCIN) 300 MG capsule, Take 1 capsule (300 mg total) by mouth 2 (two) times daily. Take for 7 d for HS flares, Disp: 60 capsule, Rfl: 0   diazepam (VALIUM) 5 MG tablet, Take 1 tablet (5 mg total) by mouth every 12 (twelve) hours as needed for up to 1 dose for anxiety (preprocedural anxiety)., Disp: 1  tablet, Rfl: 0   dicyclomine (BENTYL) 10 MG capsule, Take 1 capsule (10 mg total) by mouth 4 (four) times daily -  before meals and at bedtime., Disp: 30 capsule, Rfl: 0   felbamate (FELBATOL) 600 MG tablet, Take 600 mg by mouth 3 (three) times daily., Disp: , Rfl:    fluconazole (DIFLUCAN) 150 MG tablet, Take 1 tablet (150 mg total) by mouth every 3 (three) days as needed for up to 10 doses (for vaginal itching/yeast infection sx)., Disp: 10 tablet, Rfl: 0   fluconazole (DIFLUCAN) 150 MG tablet, Take 1 tablet (150 mg total) by mouth every 3 (three) days as needed (for vaginal itching/yeast infection sx)., Disp: 2 tablet, Rfl: 0   HUMIRA, 2 PEN, 80 MG/0.8ML PNKT, Inject into the skin., Disp: , Rfl:    HYDROcodone-acetaminophen (NORCO/VICODIN) 5-325 MG tablet, Take 1 tablet by mouth every 4 (four) hours as needed for moderate pain., Disp: 12 tablet, Rfl: 0   levocetirizine (XYZAL) 5 MG tablet, TAKE 1 TABLET BY MOUTH EVERY DAY IN THE EVENING, Disp: 90 tablet, Rfl: 1   magnesium oxide (MAG-OX) 400 MG tablet, Take by mouth., Disp: , Rfl:    Melatonin 3 MG CAPS, Take by mouth., Disp: , Rfl:    metFORMIN (GLUCOPHAGE) 500 MG tablet, Take by mouth., Disp: , Rfl:    ondansetron (ZOFRAN-ODT) 4 MG disintegrating tablet, Take 1 tablet (4 mg  total) by mouth every 8 (eight) hours as needed., Disp: 20 tablet, Rfl: 0   pantoprazole (PROTONIX) 40 MG tablet, Take 1 tablet (40 mg total) by mouth 2 (two) times daily., Disp: 180 tablet, Rfl: 1   pyridOXINE (VITAMIN B-6) 100 MG tablet, Take 100 mg by mouth daily., Disp: , Rfl:    spironolactone (ALDACTONE) 100 MG tablet, Take by mouth., Disp: , Rfl:    topiramate (TOPAMAX) 25 MG tablet, Take 50 mg by mouth at bedtime., Disp: , Rfl:    TURMERIC CURCUMIN PO, Take 2 capsules by mouth daily., Disp: , Rfl:    SUMAtriptan (IMITREX) 50 MG tablet, Take 50 mg by mouth daily as needed., Disp: , Rfl:    Allergies  Allergen Reactions   Codeine Anaphylaxis   Penicillins  Anaphylaxis and Other (See Comments)    Has patient had a PCN reaction causing immediate rash, facial/tongue/throat swelling, SOB or lightheadedness with hypotension: Yes Has patient had a PCN reaction causing severe rash involving mucus membranes or skin necrosis: No Has patient had a PCN reaction that required hospitalization: Yes Has patient had a PCN reaction occurring within the last 10 years: No If all of the above answers are "NO", then may proceed with Cephalosporin use.  Has patient had a PCN reaction causing immediate rash, facial/tongue/throat swelling, SOB or lightheadedness with hypotension: Yes Has patient had a PCN reaction causing severe rash involving mucus membranes or skin necrosis: No Has patient had a PCN reaction that required hospitalization: Yes Has patient had a PCN reaction occurring within the last 10 years: No If all of the above answers are "NO", then may proceed with Cephalosporin use. Has patient had a PCN reaction causing immediate rash, facial/tongue/throat swelling, SOB or lightheadedness with hypotension: Yes Has patient had a PCN reaction causing severe rash involving mucus membranes or skin necrosis: No Has patient had a PCN reaction that required hospitalization: Yes Has patient had a PCN reaction occurring within the last 10 years: No If all of the above answers are "NO", then may proceed with Cephalosporin use.   Sulfa Antibiotics Hives   Tape Other (See Comments)    Large Boils-PAPER TAPE OK TO USE Large Boils-PAPER TAPE OK TO USE     Social History   Tobacco Use   Smoking status: Never   Smokeless tobacco: Never  Vaping Use   Vaping status: Never Used  Substance Use Topics   Alcohol use: Never   Drug use: Never      Chart Review Today: I personally reviewed active problem list, medication list, allergies, family history, social history, health maintenance, notes from last encounter, lab results, imaging with the patient/caregiver  today.   Review of Systems  Constitutional: Negative.   HENT: Negative.    Eyes: Negative.   Respiratory: Negative.    Cardiovascular: Negative.   Gastrointestinal: Negative.   Endocrine: Negative.   Genitourinary: Negative.   Musculoskeletal: Negative.   Skin: Negative.   Allergic/Immunologic: Negative.   Neurological: Negative.   Hematological: Negative.   Psychiatric/Behavioral: Negative.    All other systems reviewed and are negative.      Objective:   Vitals:   01/17/23 1003  BP: 102/64  Pulse: 92  Resp: 16  Temp: 98 F (36.7 C)  TempSrc: Oral  SpO2: 98%  Weight: 222 lb 11.2 oz (101 kg)  Height: 5\' 5"  (1.651 m)    Body mass index is 37.06 kg/m.  Physical Exam Vitals and nursing note reviewed.  Constitutional:  General: She is not in acute distress.    Appearance: Normal appearance. She is well-developed. She is obese. She is not ill-appearing, toxic-appearing or diaphoretic.  HENT:     Head: Normocephalic and atraumatic.     Nose: Nose normal.  Eyes:     General:        Right eye: No discharge.        Left eye: No discharge.     Conjunctiva/sclera: Conjunctivae normal.  Neck:     Trachea: No tracheal deviation.  Cardiovascular:     Rate and Rhythm: Normal rate and regular rhythm.  Pulmonary:     Effort: Pulmonary effort is normal. No respiratory distress.     Breath sounds: No stridor.  Musculoskeletal:        General: Normal range of motion.  Skin:    General: Skin is warm and dry.     Findings: Lesion present. No rash.     Comments: Left buttock lesion about 2.5 cm in diameter, minimally raised - red to purple appearing, tender, no fluctuance, looks like there may be some fluid in skin? No induration, no surrounding induration, edema  Neurological:     Mental Status: She is alert.     Motor: No abnormal muscle tone.     Coordination: Coordination normal.  Psychiatric:        Behavior: Behavior normal.      Results for orders placed  or performed in visit on 11/16/22  CBC with Differential/Platelet  Result Value Ref Range   WBC 12.0 (H) 3.8 - 10.8 Thousand/uL   RBC 4.13 3.80 - 5.10 Million/uL   Hemoglobin 12.4 11.7 - 15.5 g/dL   HCT 62.9 52.8 - 41.3 %   MCV 90.6 80.0 - 100.0 fL   MCH 30.0 27.0 - 33.0 pg   MCHC 33.2 32.0 - 36.0 g/dL   RDW 24.4 01.0 - 27.2 %   Platelets 529 (H) 140 - 400 Thousand/uL   MPV 9.5 7.5 - 12.5 fL   Neutro Abs 7,104 1,500 - 7,800 cells/uL   Lymphs Abs 3,792 850 - 3,900 cells/uL   Absolute Monocytes 744 200 - 950 cells/uL   Eosinophils Absolute 276 15 - 500 cells/uL   Basophils Absolute 84 0 - 200 cells/uL   Neutrophils Relative % 59.2 %   Total Lymphocyte 31.6 %   Monocytes Relative 6.2 %   Eosinophils Relative 2.3 %   Basophils Relative 0.7 %  Pathologist smear review  Result Value Ref Range   Path Review         Assessment & Plan:   1. 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

## 2023-01-21 ENCOUNTER — Other Ambulatory Visit: Payer: Self-pay

## 2023-01-21 ENCOUNTER — Ambulatory Visit: Payer: Medicaid Other | Admitting: Gastroenterology

## 2023-01-21 ENCOUNTER — Encounter: Payer: Self-pay | Admitting: Family Medicine

## 2023-01-21 ENCOUNTER — Encounter: Payer: Self-pay | Admitting: Gastroenterology

## 2023-01-21 ENCOUNTER — Emergency Department
Admission: EM | Admit: 2023-01-21 | Discharge: 2023-01-21 | Disposition: A | Discharge status: Home / Self Care | Payer: Medicaid Other | Attending: Emergency Medicine | Admitting: Emergency Medicine

## 2023-01-21 VITALS — BP 114/82 | HR 84 | Temp 98.4°F | Ht 65.0 in | Wt 222.5 lb

## 2023-01-21 DIAGNOSIS — L539 Erythematous condition, unspecified: Secondary | ICD-10-CM | POA: Insufficient documentation

## 2023-01-21 DIAGNOSIS — S300XXA Contusion of lower back and pelvis, initial encounter: Secondary | ICD-10-CM | POA: Diagnosis not present

## 2023-01-21 DIAGNOSIS — K219 Gastro-esophageal reflux disease without esophagitis: Secondary | ICD-10-CM

## 2023-01-21 DIAGNOSIS — T148XXA Other injury of unspecified body region, initial encounter: Secondary | ICD-10-CM

## 2023-01-21 DIAGNOSIS — Z8719 Personal history of other diseases of the digestive system: Secondary | ICD-10-CM | POA: Diagnosis not present

## 2023-01-21 DIAGNOSIS — R58 Hemorrhage, not elsewhere classified: Secondary | ICD-10-CM | POA: Diagnosis not present

## 2023-01-21 MED ORDER — LIDOCAINE HCL 1 % IJ SOLN
5.0000 mL | Freq: Once | INTRAMUSCULAR | Status: AC
  Administered 2023-01-21: 5 mL
  Filled 2023-01-21: qty 10

## 2023-01-21 NOTE — ED Notes (Signed)
Reinforced gauze and medical tape in drained abscess for Pt.

## 2023-01-21 NOTE — ED Provider Notes (Signed)
[facility] Provider Note  Patient Contact: 3:29 PM (approximate)   History   No chief complaint on file.   HPI  Destiny Flores is a 42 y.o. female presents to the emergency department with erythema and ecchymosis  along the lateral aspect of her left buttocks.  Patient has been seen and evaluated by her primary care provider who did not have the tools to facilitate I&D.  She does have a history of hidradenitis but she reports that her current pain feels different.  She states that she might have bumped her hip up against a counter.  She is currently taking doxycycline.      Physical Exam   Triage Vital Signs: ED Triage Vitals  Encounter Vitals Group     BP 01/21/23 1515 (!) 155/106     Systolic BP Percentile --      Diastolic BP Percentile --      Pulse Rate 01/21/23 1515 100     Resp 01/21/23 1515 16     Temp 01/21/23 1515 98 F (36.7 C)     Temp Source 01/21/23 1515 Oral     SpO2 01/21/23 1515 95 %     Weight --      Height --      Head Circumference --      Peak Flow --      Pain Score 01/21/23 1516 5     Pain Loc --      Pain Education --      Exclude from Growth Chart --     Most recent vital signs: Vitals:   01/21/23 1515  BP: (!) 155/106  Pulse: 100  Resp: 16  Temp: 98 F (36.7 C)  SpO2: 95%     General: Alert and in no acute distress. Eyes:  PERRL. EOMI. Head: No acute traumatic findings ENT:      Nose: No congestion/rhinnorhea.      Mouth/Throat: Mucous membranes are moist. Neck: No stridor. No cervical spine tenderness to palpation. Cardiovascular:  Good peripheral perfusion Respiratory: Normal respiratory effort without tachypnea or retractions. Lungs CTAB. Good air entry to the bases with no decreased or absent breath sounds. Gastrointestinal: Bowel sounds 4 quadrants. Soft and nontender to palpation. No guarding or rigidity. No palpable masses. No distention. No CVA tenderness. Musculoskeletal: Full range of motion  to all extremities.  Neurologic:  No gross focal neurologic deficits are appreciated.  Skin: Patient has erythema and ecchymosis overlying left lateral hip.    ED Results / Procedures / Treatments   Labs (all labs ordered are listed, but only abnormal results are displayed) Labs Reviewed - No data to display     PROCEDURES:  Critical Care performed: No  ..Incision and Drainage  Date/Time: 01/21/2023 3:31 PM  Performed by: Orvil Feil, PA-C Authorized by: Orvil Feil, PA-C   Consent:    Consent obtained:  Verbal   Risks discussed:  Bleeding and incomplete drainage Universal protocol:    Procedure explained and questions answered to patient or proxy's satisfaction: yes     Patient identity confirmed:  Verbally with patient Location:    Type:  Fluid collection   Size:  Hematoma   Location:  Lower extremity   Lower extremity location:  Buttock   Buttock location:  L buttock Pre-procedure details:    Skin preparation:  Povidone-iodine Anesthesia:    Anesthesia method:  Local infiltration   Local anesthetic:  Lidocaine 1% w/o epi Procedure type:    Complexity:  Simple Procedure details:    Incision types:  Stab incision   Wound management:  Probed and deloculated   Drainage amount:  Moderate Post-procedure details:    Procedure completion:  Tolerated well, no immediate complications    MEDICATIONS ORDERED IN ED: Medications  lidocaine (XYLOCAINE) 1 % (with pres) injection 5 mL (has no administration in time range)     IMPRESSION / MDM / ASSESSMENT AND PLAN / ED COURSE  I reviewed the triage vital signs and the nursing notes.                              Assessment and plan: Hematoma:  42 year old female presents to the emergency department with concern for a possible abscess along the left lateral hip.  Patient was hypertensive at triage but vital signs were otherwise reassuring.  On exam, patient was alert and nontoxic appearing.  Patient had  incision and drainage conducted in the emergency department and affected area seems like a hematoma as opposed to an abscess.  I did recommend that patient continue taking her doxycycline as directed by her PCP and returning to the emergency department if symptoms seem to be worsening.     FINAL CLINICAL IMPRESSION(S) / ED DIAGNOSES   Final diagnoses:  Hematoma     Rx / DC Orders   ED Discharge Orders     None        Note:  This document was prepared using Dragon voice recognition software and may include unintentional dictation errors.   Pia Mau [place], Cordelia Poche 01/21/23 1605    Jene Every, MD 01/21/23 [id number]

## 2023-01-21 NOTE — ED Triage Notes (Signed)
Pt to ED for abscess to left hip for 4 weeks. Has been seen at PCP and recommended for biopsy.

## 2023-01-21 NOTE — Progress Notes (Signed)
Wyline Mood MD, MRCP(U.K) [address]  [address]  [place], [place] [address]  Main: [phone]  Fax: [phone]   Gastroenterology Consultation  Referring Provider:     Danelle Berry, PA-C Primary Care Physician:  Danelle Berry, PA-C Primary Gastroenterologist:  Dr. Wyline Mood  Reason for Consultation:   Abdominal pain        HPI:   Destiny Flores is a 42 y.o. y/o female referred for abdominal pain.  Seen in the emergency room in March 2024 with sharp right flank pain.  History of kidney stones.  At that time also had symptoms of nausea as well as diarrhea a CT scan of the abdomen renal stool been protocol was performed that showed the colon is diffusely fluid-filled nonspecific finding no other abnormality was noted.  12/03/2022 hemoglobin 12.4 g 10/31/2022 CMP normal.  10/04/2022 was seen by Della Goo, FNP was diagnosed with drug-induced constipation placed on stool softeners.  And referred to GI    The episode that she had in April 2024 resolved within a few days.  She also mentioned that she has had acid reflux for many years.  She complains of a sour taste in her throat when she lays flat.  Takes Protonix twice a day which resolves all her issues.  She has intentionally lost weight recently. 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   Family history of breast cancer    5/23 cancer genetic testing letter sent   GERD (gastroesophageal reflux disease)    Headache    Hidradenitis suppurativa 12/24/2017   History of kidney stones    H/O   History of methicillin resistant staphylococcus aureus (MRSA) 2008   positive from axilla, left   Hx of Kawasaki's  disease    Hydradenitis    Seizures (HCC)    LAST SEIZURE 2012   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    AXILLARY HIDRADENITIS EXCISION     MULTIPLE [place]ENIT[place] SURGERIES IN [place]   hidradenitis groin Left 04/2017   last surgery done on groin and the symptoms have returned   HYDRADENITIS EXCISION N/A 01/16/2018   Procedure: EXCISION HIDRADENITIS GROIN;  Surgeon: Leafy Ro, MD;  Location: [facility] ORS;  Service: General;  Laterality: N/A;   KNEE SURGERY Left 2010   tendon and meniscus repair   NOSE SURGERY  1998   deviated septum and sinus repair   ROBOTIC ASSISTED LAPAROSCOPIC CHOLECYSTECTOMY N/A 07/24/2018   Procedure: ROBOTIC ASSISTED LAPAROSCOPIC CHOLECYSTECTOMY;  Surgeon: Leafy Ro, MD;  Location: [facility] ORS;  Service: General;  Laterality: N/A;   TONSILLECTOMY     TYMPANOSTOMY TUBE PLACEMENT     as a kid   VAGUS NERVE STIMULATOR GENERATOR CHANGE  03/2015    Prior to Admission medications   Medication Sig Start Date End Date Taking? Authorizing Provider  ascorbic acid (VITAMIN C) 1000 MG tablet Take by mouth.    [provider]  Azelastine HCl 137 MCG/SPRAY SOLN PLACE 2 SPRAYS INTO BOTH NOSTRILS  2 (TWO) TIMES DAILY AS NEEDED FOR RHINITIS. USE IN EACH NOSTRIL AS DIRECTED 11/07/22   Danelle Berry, PA-C  Brivaracetam (BRIVIACT) 100 MG TABS Take 1 tablet by mouth 2 (two) times daily. 11/21/20   [provider]  clindamycin (CLEOCIN) 300 MG capsule Take 1 capsule (300 mg total) by mouth 2 (two) times daily. Take for 7 d for HS flares 11/16/22   Danelle Berry, PA-C  diazepam (VALIUM) 5 MG tablet Take 1 tablet (5 mg total) by mouth every 12 (twelve) hours as needed for up to 1 dose for anxiety (preprocedural anxiety). 12/03/22   Danelle Berry, PA-C  dicyclomine (BENTYL)  10 MG capsule Take 1 capsule (10 mg total) by mouth 4 (four) times daily -  before meals and at bedtime. 09/30/22   Cuthriell, Delorise Royals, PA-C  doxycycline (VIBRA-TABS) 100 MG tablet Take 1 tablet (100 mg total) by mouth 2 (two) times daily for 5 days. 01/17/23 01/22/23  Danelle Berry, PA-C  felbamate (FELBATOL) 600 MG tablet Take 600 mg by mouth 3 (three) times daily.    [provider]  fluconazole (DIFLUCAN) 150 MG tablet Take 1 tablet (150 mg total) by mouth every 3 (three) days as needed for up to 10 doses (for vaginal itching/yeast infection sx). 06/19/22   Danelle Berry, PA-C  fluconazole (DIFLUCAN) 150 MG tablet Take 1 tablet (150 mg total) by mouth every 3 (three) days as needed (for vaginal itching/yeast infection sx). 10/19/22   Danelle Berry, PA-C  HUMIRA, 2 PEN, 80 MG/0.8ML PNKT Inject into the skin.    [provider]  HYDROcodone-acetaminophen (NORCO/VICODIN) 5-325 MG tablet Take 1 tablet by mouth every 4 (four) hours as needed for moderate pain. 09/30/22 09/30/23  Cuthriell, Delorise Royals, PA-C  levocetirizine (XYZAL) 5 MG tablet TAKE 1 TABLET BY MOUTH EVERY DAY IN THE EVENING 10/02/22   Danelle Berry, PA-C  magnesium oxide (MAG-OX) 400 MG tablet Take by mouth.    [provider]  Melatonin 3 MG CAPS Take by mouth.    [provider]  metFORMIN (GLUCOPHAGE) 500 MG tablet Take by mouth. 07/03/22 07/03/23  [provider]  ondansetron (ZOFRAN-ODT) 4 MG disintegrating tablet Take 1 tablet (4 mg total) by mouth every 8 (eight) hours as needed. 09/30/22   Cuthriell, Delorise Royals, PA-C  pantoprazole (PROTONIX) 40 MG tablet Take 1 tablet (40 mg total) by mouth 2 (two) times daily. 12/24/22   Danelle Berry, PA-C  pyridOXINE (VITAMIN B-6) 100 MG tablet Take 100 mg by mouth daily.    [provider]  spironolactone (ALDACTONE) 100 MG tablet Take by mouth. 12/10/19   [provider]  SUMAtriptan (IMITREX) 50 MG tablet Take 50 mg by mouth daily as needed.  07/25/20 07/25/21  [provider]  topiramate (TOPAMAX) 25 MG tablet Take 50 mg by mouth at bedtime. 09/25/21   [provider]  TURMERIC CURCUMIN PO Take 2 capsules by mouth daily.    [provider]    Family History  Problem Relation Age of Onset   Hyperlipidemia Father    Epilepsy Brother    ADD / ADHD Brother    Breast cancer Paternal Grandmother 42       Passed away at 21yo   Asthma Mother      Social History   Tobacco Use   Smoking status: Never   Smokeless tobacco: Never  Vaping Use   Vaping status: Never Used  Substance Use Topics   Alcohol use: Never   Drug use:  Never    Allergies as of 01/21/2023 - Review Complete 01/21/2023  Allergen Reaction Noted   Codeine Anaphylaxis 11/21/2017   Penicillins Anaphylaxis and Other (See Comments) 11/21/2017   Sulfa antibiotics Hives 11/21/2017   Tape Other (See Comments) 01/06/2018    Review of Systems:    All systems reviewed and negative except where noted in HPI.   Physical Exam:  BP 114/82   Pulse 84   Temp 98.4 F (36.9 C) (Oral)   Ht 5\' 5"  (1.651 m)   Wt 222 lb 8 oz (100.9 kg)   BMI 37.03 kg/m  No LMP recorded. Psych:  Alert and cooperative. Normal mood and affect. General:   Alert,  Well-developed, well-nourished, pleasant and cooperative in NAD Head:  Normocephalic and atraumatic. Eyes:  Sclera clear, no icterus.   Conjunctiva pink. Ears:  Normal auditory acuity. Neck:  Supple; no masses or thyromegaly. Abdomen:  Normal bowel sounds.  No bruits.  Soft, non-tender and non-distended without masses, hepatosplenomegaly or hernias noted.  No guarding or rebound tenderness.    Neurologic:  Alert and oriented x3;  grossly normal neurologically. Psych:  Alert and cooperative. Normal mood and affect.  Imaging Studies: No results found.  Assessment and Plan:   Destiny Flores is a 42 y.o. y/o female seen in the ER in April 2024 with nausea vomiting diarrhea CT scan showed fluid-filled  colon very suggestive of episode of gastroenteritis due to acute presentation.  Subsequently referred to GI initial episode appears to have resolved and she has had no further episodes.  Normal formed bowel movements no blood in the stool.  She also mention she has had symptoms which are suggestive of GERD.  We discussed about lifestyle changes including timing of meals using of PPI as well as weight loss.  Patient information has been provided.  I would recommend trying to decrease the dose and using the lowest dose possible of PPI and if possible change to H2 receptor blocker down the road. Presently there is no indication for endoscopy evaluation as she has no symptoms while on treatment.   Follow up in as needed  Dr Wyline Mood MD,MRCP(U.K)

## 2023-01-21 NOTE — Patient Instructions (Signed)
"  [facility]"

## 2023-02-21 DIAGNOSIS — G808 Other cerebral palsy: Secondary | ICD-10-CM | POA: Diagnosis not present

## 2023-02-21 DIAGNOSIS — M533 Sacrococcygeal disorders, not elsewhere classified: Secondary | ICD-10-CM | POA: Diagnosis not present

## 2023-02-21 DIAGNOSIS — G40909 Epilepsy, unspecified, not intractable, without status epilepticus: Secondary | ICD-10-CM | POA: Diagnosis not present

## 2023-02-21 DIAGNOSIS — Z9689 Presence of other specified functional implants: Secondary | ICD-10-CM | POA: Diagnosis not present

## 2023-05-06 ENCOUNTER — Ambulatory Visit: Admit: 2023-05-06 | Discharge: 2023-05-07 | Payer: BLUE CROSS/BLUE SHIELD

## 2023-05-06 DIAGNOSIS — L732 Hidradenitis suppurativa: Principal | ICD-10-CM

## 2023-05-06 DIAGNOSIS — Z79899 Other long term (current) drug therapy: Principal | ICD-10-CM

## 2023-05-06 MED ORDER — SECUKINUMAB 300 MG/2 ML (150 MG/ML) SUBCUTANEOUS PEN INJECTOR
SUBCUTANEOUS | 0 refills | 0.00000 days | Status: CP
Start: 2023-05-06 — End: ?

## 2023-05-06 MED ADMIN — triamcinolone acetonide (KENALOG-40) injection 40 mg: 40 mg | @ 20:00:00 | Stop: 2023-05-06

## 2023-05-06 NOTE — Unmapped (Signed)
Dermatology Note    Assessment and Plan:      Hidradenitis Suppurativa (HS): Hurley Stage 2, chronic, continues to flare around menses, not at patient goal  - S/p unroofing procedure in the L perianal area, R perianal area, and L labia majora (partial labial removal) on 12/04/2022  - We discussed the typical natural history, pathogenesis, treatment options, and expected course as well as the relapsing and sometimes recalcitrant nature of the disease.    - Patient prefers to avoid infliximab due to side effects  - Discussed antibiotics and starting Cosentyx today. Jointly decided to pursue Cosentyx and continue metformin.   - Start secukinumab 300 mg/2 mL (150 mg/mL) PnIj; Inject 300 mg under the skin once a week. Loading doses - inject 300mg  weekly on week 0, 1, 2, 3, and 4 (5 weeks total)  - secukinumab 300 mg/2 mL (150 mg/mL) PnIj; Inject 300 mg under the skin every twenty-eight (28) days. Maintenance dosing.    - Continue metformin 500 mg BID. Side effects discussed.  - Continue spironolactone (ALDACTONE) 100 MG tablet; Take 2 tablets (200 mg total) by mouth daily. Does not require refills.  - Continue clindamycin (CLEOCIN) 300 MG capsule; Take 1 capsule (300 mg total) by mouth two (2) times a day for 14 days. Take for flares of HS. Take with probiotic. Stop if diarrhea develops.   - Stop Humira 80 mg weekly given inadequate response at typical dosing and inability to get 80mg  weekly approved  - Note: unable to take rifampin because is interacts with her seizure medications (briviact and felbatol)  - Offered ILK today. Patient elects to pursue per below:    Intralesional Kenalog Procedure Note: After the patient was informed of risks (including atrophy and dyspigmentation), benefits and side effects of intralesional steroid injection, the patient elected to undergo injection and verbal consent was obtained. Skin was cleaned with alcohol and injected intralesionally into the sites (below). The patient tolerated the procedure well without complications and was instructed on post-procedure care.  Location(s): buttocks, groin    Number of sites treated: 2  Kenalog (triamcinolone) Concentration: 40 mg/ml   Volume: 0.5 ml total      High risk medication use (initiating Cosentyx)  - Negative Quant gold obtained 07/03/2022  - Adalimumab/Adalimumab Ab slightly low 07/03/2022 so was unable to increase Humira dosage      The patient was advised to call for an appointment should any new, changing, or symptomatic lesions develop.     RTC: Return in about 6 months (around 11/03/2023) for follow up of HS. or sooner as needed   _________________________________________________________________      Chief Complaint     Chief Complaint   Patient presents with    HS     Usually Groin and stomach area now its one under arms pits no drainage  using Humira but insurance stopped the Humira Also,taking metformin and spiranolactone     HPI     Erica Norman is a 42 y.o. female who presents as a returning patient (last seen by Dr. Herbie Drape and Dr. Janyth Contes on 12/04/2022) to Dermatology for follow up of hidradenitis suppurativa. At last visit, patient underwent an unroofing procedure for bilateral perianal areas and labia majora for HS.    Today, patient reports her HS worsened in October with new flares in her groin near previously excised lesions, worse on left side. She notes tenderness and pruritus in groin and buttocks. She notes a new flare in her left underarm that presented  last Wednesday. She has not been using Humira recently due to confusion with lab results.    Self-reported severity (0-5): 3  VAS pain today:3  VAS average pain for the last month: 8  Requiring pain medication? Yes.  If so, what type/frequency? Heat, tylenol, naproxen   How often in pain?  few times a month  Level of odor (0-5): 1  Level of itching (0-5): 2  Dressing changes needed for drainage:No drainage/less than once weekly  How much drainage: no drainage  Flare in the last month (Y/N)? Yes.  How long ago was the last flare? in last 6 months  Developing new lesions? less than monthly  Number of inflammatory lesions montly: 1-3  DLQI: 10  Current treatment: n/a     How helpful is the current treatment in managing the following aspects of your disease?  Not at all helpful Somewhat helpful Very helpful   Pain     x   Decreasing length of flares   x     Decreasing new lesions   x     Drainage x       Decreasing frequency of flares   x     Decreasing severity of flares   x     Odor x           The patient denies any other new or changing lesions or areas of concern.     Pertinent Past Medical History     Disease course:  Year when symptoms first noticed: Age 59   Year of diagnosis: Age 73  Who diagnosed you? Dermatologist  Location of first symptoms: axillae  Typical involved areas include: groin  Typical number of inflammatory lesions each month at baseline (from first visit): three to five  Disease triggers: stress, sweat and menstrual cycle     Are menstrual cycles irregular when not on birth control? No.  Current form of contraception: None  Effect of hormonal contraception on disease: n/a  Flaring with menstrual cycle (before, during, or after?): during  Difficulty becoming pregnant? n/a  Pregnancy complications? never pregnant    How many children? 0  Better or worse with pregnancy?  never pregnant.  If so, worse during a specific trimester? never pregnant       Prior treatments:  Topical: Clobetasol  Systemic: Keflex, Doxycyline, Humira titrated up to 80mg  weekly, but stopped in summer 2024 due to insurance issues, Clindamycin  Past surgical procedures: Yes -bilateral wide excisions of the axilla healed well since 2008 or 2009.  She had more recent excisions in the bilateral inguinal folds in 2016-2018.  These have mostly healed well but she has some persistent areas in particular along the infra abdominal fold and one in the right inguinal fold  Past laser procedures: No     Past Medical History, Family History, Social History, Medication List, Allergies, and Problem List were reviewed in the rooming section of Epic.     ROS: Other than symptoms mentioned in the HPI, no fevers, chills, or other skin complaints    Physical Examination     Gen: Well-appearing patient, appropriate, interactive, in no acute distress  SKIN (Focal Skin Exam): Per patient request, examination of bilateral axillae, chest, abdomen, bilateral thighs, groin, buttocks, and external genitalia was performed    location Abscess Inflamed nodule Non-inflamed nodule Draining sinus Non-draining Sinus Hurley % scar   R axilla          L axilla  1  R inframammary          L inframammary          Intermammary          Pubic          R inguinal          R thigh          L inguinal  1   1     L thigh          Scrotum/Vulva          Perianal          R buttock  1   1     L buttock          Other (list)                      AN count (total sum of abscess and inflammatory nodule): 3  Pilonidal sinus? No    -sites not commented on demonstrate normal findings.    Scribe's Attestation: Elsie Stain, MD obtained and performed the history, physical exam and medical decision making elements that were entered into the chart.  Signed by Sharol Harness, Scribe, on May 06, 2023 at 2:27 PM.    ----------------------------------------------------------------------------------------------------------------------  May 06, 2023 9:52 PM. Documentation assistance provided by the Scribe. I was present during the time the encounter was recorded. The information recorded by the Scribe was done at my direction and has been reviewed and validated by me.  ----------------------------------------------------------------------------------------------------------------------       (Approved Template 03/14/2020)

## 2023-05-06 NOTE — Unmapped (Addendum)
Meet your team:     Your intake nurse is: Britt Boozer    Please remember to fill out the survey you will receive after your visit. Your comments help [place] continue to improve our care.      Thanks in advance!      [facility] Dermatology Clinical Staff       - Inject one Cosentyx pen once a week for 4 weeks. Then inject one Cosentyx pen once every 28 days.    You are welcome to join the [facility] for HS of the [employer] group online at [email or web address] or in-person at our regular meetings.  You can textHS to [phone] for meeting reminders or join the group online for regular updates.  This can be a great opportunity to interact and learn from other patients and help work with the HS community.  We hope to see your there!    Hidradentis Suppurativa (pronounced ???high-drad-en-eye-tis/sup-your-uh-tee-vah???) is a chronic disease of hair follicles.  The lesions occur most commonly on areas of skin-to-skin contact: under the arms (axillary area), in the groin, around the buttocks, in the region around the anus and genitals, and on the skin between and under the breasts. In women, the underarms, groin, and breast areas are most commonly affected. Men most often have HS lesions on the buttocks and under the arms and may also have HS at the back of the neck and behind and around the ears.    What does HS look and feel like?   The first thing that someone with HS notices is a tender, raised, red bump that looks like an under-the-skin pimple or boil. Sometimes HS lesions have two or more ???heads.???  In mild disease only an occasional boil or abscess may occur, but in more active disease there can be many new lesions every month.  Some abscesses can become larger and may open and drain pus.  Bleeding and increased odor can also occur. In severe disease, deeper abscesses develop and may connect with each other under the skin to form tunnel-like tracts (sinuses, fistulas).  These may drain constantly, or may temporarily improve and then usually begin draining again over time.  In people who have had sinus tracts for some time, scars form that feel like ropes under the skin. In the very worst cases, networks of sinus tracts can form deeper in the body, including the muscle and other tissues. Many people with severe HS have scars that can limit their ability to freely move their arms or legs, though this is very unlikely for most patients.     Clinicians usually classify or ???grade??? HS using the [facility] staging system according to the severity of the disease for each body location:   [place] stage I: one or more abscesses are present, but no sinus tracts have formed and no scars have developed   [place]oreene Adas stage II: one or more abscesses are present that resolve and recur; on sinus tract can be present and scarring is seen   Doreene Adas stage III: many abscesses and more than one sinus tract is present with extensive scars.    What causes HS?  The cause of HS is not completely understood.  It seems to be a disorder of hair follicles and often many family members are affected so genetics probably play a strong role.  Bacteria are often present and may make the disease worse, but infection does not seem to be the main cause. Hormones are also likely play a role since the  condition typically starts around puberty when hair follicles under the arms and in the groin start to change.  It can sometimes flare with menstrual cycles in women as well.  In most cases it lasts for decades and starts to improve to some extent in the late 30s and 40s as long as many fistulas have not already formed.  Women are three times more likely than men to develop HS.    Other factors are known to contribute to HS flaring or becoming worse, though they are likely not the main causes. The factors most commonly associated with HS include:   Cigarette smoking - Stopping smoking will likely not cure the disease, but likely is helpful in reducing how much and how often it flares and may prevent it from getting as bad over time.   Higher weight - HS may occur even in people that are not overweight, but it is much more common in patients that are.  There is some evidence that losing weight and eating a diet low in sugars and fats may be helpful in improving hidradenitis, though this is not helpful for everyone.  Working with a nutritionist may be an important way to help with this and is something your physician can help coordinate    Hidradenitis is not contagious.  It is not caused by a problem with personal hygiene or any other activity or behavior of those with the disease.    How can your doctor help you treat your hidradenitis?  Clinicians use both medication and surgery to treat HS. The choice of treatment--or combination of treatments--is made according to an individual patient???s needs. Clinicians consider several factors in determining the most appropriate plan for therapy:   Severity of disease - medications and some laser treatments are usually able to control disease best when fistulas are not present.  Fistulas typically require surgery.   Extent and location of disease   Chronicity (how often the lesions recur)    A number of different surgical methods have been developed that are useful for certain patients under particular circumstances. These can be done with local numbing and healing at home for some areas when disease is not too extensive with relatively brief recovery times.  In more extensive disease there may be a need for larger excisions under general anesthesia with healing time in the hospital and prolonged recovery periods for better disease control.      In addition, many medical treatments have been tried--some with more success than others. No medication is effective for all patients, and you and your doctor may have to try several different treatments or combinations of treatments before you find the treatment plan that works best for you.  The goals of therapy with medications that are either topical (used on the skin) or systemic (taken by mouth) are:  1. to clear the lesions or at least reduce their number and extent, and  2. to prevent new lesions from forming.  3. To reduce pain, drainage, and odor  Some of the types of medications commonly used are antibacterial skin washes and the topical antibiotics to prevent secondary infections and corticosteroid injections into the lesions to reduce inflammation.     Other medications that may be used include retinoids (similar to Accutane), drugs that effect how hormones and hair follicles interact, drugs that affect your immune system (such as methotrexate, adalimumab/Humira, and Remicaid/infliximab), steroids, and oral antibiotics.    Lasers that destroy hair follicles can also be helpful since they  reduce the hair follicles that cause the problems.  Multiple treatments are typically required over time and there is some discomfort associated with treatment, but it is typically very fast and well-tolerated.    It is very important to realize that hidradenitis cannot usually be completely cured with any single medication or surgical procedure.  It is a disease that can be very stubborn and difficult to control, but with good treatment a lot of improvement and sometimes temporary remissions can be obtained. Poorly controlled disease can cause more fistulas to form and make managing the disease much more difficult over time so it is important to seek care to reduce major flares.  Surgery can provide a long term cure in some areas, though the disease can start again or continue in nearby areas.  A dermatologist is often the best person to help coordinate disease treatment, and sometimes other surgeons, pain specialists, other specialists, and nutritionists may be part of the treatment team.    For severe disease, the first goal is often to reduce pain and symptoms with medicines so that the disease feels more stable. Once it's stable, we often start thinking about how to address areas that have completely gotten better with surgery if they are still causing problems.    What can you do to help your HS?  1. Stopping smoking is hard and may not fix everything, but it may be a step in the right direction.  We or your primary care physician can provide resources to help stop if you are interested.  2. Follow a healthy diet and try to achieve a healthy weight.  Some other self-help measures are:   Keep your skin cool and dry (becoming overheated and sweating can contribute to an HS flare)   To reduce the pain of cysts or nodules or to help them to drain, apply hot compresses or soak in hot water for 10 minutes at a time (use a clean washcloth or a teabag soaked in hot water)   For female patients, cotton underwear that does not have tight elastic in the groin can be helpful.  Boyshort, brief, or boxer style underwear may be a better option as friction on hair follicles in affected areas can be a major trigger in some patients.  These can be easily found on [place] or with some retailers.  Fruit [employer]oom and [employer]works are two brands that are sometimes recommended.    Finally, know that you are not alone. Coping with the pain and other symptoms of HS can be very difficult, so it may be helpful to connect with others who live with HS. Patient groups and networks can be sources of important information and support. Some internet resources for information and connections are provided below.    Psychologytoday.com is a resource to find psychologists and therapists that can help support you in your are     [email or web address] can help connect with sexual health resources and counselors    Resources for Information    The Hidradenitis Suppurativa Foundation: A nonprofit organized by a group of physicians interested in treating and advancing research in hidradenitis suppurativa.  This group advocates for better care and research for hidradenitis and has educational materials put together specifically for patients that have been reviewed and produced by doctors and people with hidradenitis.    American Academy of Dermatology  ARanked.fi    [employer]  ElevatorPitchers.de.html  [place]: [employer] for [employer]  https://www.rarediseases.org/rare-disease-information/rare-diseases/byID/358/viewAbstract  Trials  of new medications for HS  Https://www.clinicaltrials.gov

## 2023-05-06 NOTE — Unmapped (Deleted)
Self-reported severity (0-5): 3  VAS pain today:3  VAS average pain for the last month: 8  Requiring pain medication? Yes.  If so, what type/frequency? Heat, tylenol, naproxen   How often in pain?  few times a month  Level of odor (0-5): 1  Level of itching (0-5): 2  Dressing changes needed for drainage:No drainage/less than once weekly  How much drainage: no drainage  Flare in the last month (Y/N)? {YES/NO:21013}  How long ago was the last flare? in last 6 months  Developing new lesions? less than monthly  Number of inflammatory lesions montly: 1-3  DLQI: 10  Current treatment: ***     How helpful is the current treatment in managing the following aspects of your disease?  Not at all helpful Somewhat helpful Very helpful   Pain   x   Decreasing length of flares  x    Decreasing new lesions  x    Drainage x     Decreasing frequency of flares  x    Decreasing severity of flares  x    Odor x

## 2023-05-07 DIAGNOSIS — L732 Hidradenitis suppurativa: Principal | ICD-10-CM

## 2023-06-03 ENCOUNTER — Telehealth: Payer: Medicaid Other | Admitting: Physician Assistant

## 2023-06-03 DIAGNOSIS — J019 Acute sinusitis, unspecified: Secondary | ICD-10-CM

## 2023-06-03 DIAGNOSIS — R051 Acute cough: Secondary | ICD-10-CM | POA: Diagnosis not present

## 2023-06-03 DIAGNOSIS — B9689 Other specified bacterial agents as the cause of diseases classified elsewhere: Secondary | ICD-10-CM

## 2023-06-03 MED ORDER — AZITHROMYCIN 250 MG PO TABS
ORAL_TABLET | ORAL | 0 refills | Status: AC
Start: 2023-06-03 — End: 2023-06-08

## 2023-06-03 MED ORDER — BENZONATATE 100 MG PO CAPS: 100.0000 mg | ORAL_CAPSULE | Freq: Three times a day (TID) | ORAL | 0 refills | Status: DC | PRN

## 2023-06-03 NOTE — Patient Instructions (Signed)
Destiny Flores, thank you for joining Margaretann Loveless, PA-C for today's virtual visit.  While this provider is not your primary care provider (PCP), if your PCP is located in our provider database this encounter information will be shared with them immediately following your visit.   A [place] MyChart account gives you access to today's visit and all your visits, tests, and labs performed at [facility] " click here if you don't have a [place] MyChart account or go to [email or web address]  Consent: (Patient) Destiny Flores provided verbal consent for this virtual visit at the beginning of the encounter.  Current Medications:  Current Outpatient Medications:    azithromycin (ZITHROMAX) 250 MG tablet, Take 2 tablets on day 1, then 1 tablet daily on days 2 through 5, Disp: 6 tablet, Rfl: 0   benzonatate (TESSALON) 100 MG capsule, Take 1-2 capsules (100-200 mg total) by mouth 3 (three) times daily as needed., Disp: 30 capsule, Rfl: 0   ascorbic acid (VITAMIN C) 1000 MG tablet, Take by mouth., Disp: , Rfl:    Azelastine HCl 137 MCG/SPRAY SOLN, PLACE 2 SPRAYS INTO BOTH NOSTRILS 2 (TWO) TIMES DAILY AS NEEDED FOR RHINITIS. USE IN EACH NOSTRIL AS DIRECTED, Disp: 90 mL, Rfl: 1   Brivaracetam (BRIVIACT) 100 MG TABS, Take 1 tablet by mouth 2 (two) times daily., Disp: , Rfl:    clindamycin (CLEOCIN) 300 MG capsule, Take 1 capsule (300 mg total) by mouth 2 (two) times daily. Take for 7 d for HS flares, Disp: 60 capsule, Rfl: 0   diazepam (VALIUM) 5 MG tablet, Take 1 tablet (5 mg total) by mouth every 12 (twelve) hours as needed for up to 1 dose for anxiety (preprocedural anxiety)., Disp: 1 tablet, Rfl: 0   dicyclomine (BENTYL) 10 MG capsule, Take 1 capsule (10 mg total) by mouth 4 (four) times daily -  before meals and at bedtime., Disp: 30 capsule, Rfl: 0   felbamate (FELBATOL) 600 MG tablet, Take 600 mg by mouth 3 (three) times daily., Disp: , Rfl:    fluconazole (DIFLUCAN) 150 MG  tablet, Take 1 tablet (150 mg total) by mouth every 3 (three) days as needed for up to 10 doses (for vaginal itching/yeast infection sx)., Disp: 10 tablet, Rfl: 0   fluconazole (DIFLUCAN) 150 MG tablet, Take 1 tablet (150 mg total) by mouth every 3 (three) days as needed (for vaginal itching/yeast infection sx)., Disp: 2 tablet, Rfl: 0   HUMIRA, 2 PEN, 80 MG/0.8ML PNKT, Inject into the skin., Disp: , Rfl:    HYDROcodone-acetaminophen (NORCO/VICODIN) 5-325 MG tablet, Take 1 tablet by mouth every 4 (four) hours as needed for moderate pain., Disp: 12 tablet, Rfl: 0   levocetirizine (XYZAL) 5 MG tablet, TAKE 1 TABLET BY MOUTH EVERY DAY IN THE EVENING, Disp: 90 tablet, Rfl: 1   magnesium oxide (MAG-OX) 400 MG tablet, Take by mouth., Disp: , Rfl:    Melatonin 3 MG CAPS, Take by mouth., Disp: , Rfl:    metFORMIN (GLUCOPHAGE) 500 MG tablet, Take by mouth., Disp: , Rfl:    ondansetron (ZOFRAN-ODT) 4 MG disintegrating tablet, Take 1 tablet (4 mg total) by mouth every 8 (eight) hours as needed., Disp: 20 tablet, Rfl: 0   pantoprazole (PROTONIX) 40 MG tablet, Take 1 tablet (40 mg total) by mouth 2 (two) times daily., Disp: 180 tablet, Rfl: 1   pyridOXINE (VITAMIN B-6) 100 MG tablet, Take 100 mg by mouth daily., Disp: , Rfl:    spironolactone (ALDACTONE) 100 MG tablet,  Take by mouth., Disp: , Rfl:    SUMAtriptan (IMITREX) 50 MG tablet, Take 50 mg by mouth daily as needed., Disp: , Rfl:    topiramate (TOPAMAX) 25 MG tablet, Take 50 mg by mouth at bedtime., Disp: , Rfl:    TURMERIC CURCUMIN PO, Take 2 capsules by mouth daily., Disp: , Rfl:    Medications ordered in this encounter:  Meds ordered this encounter  Medications   azithromycin (ZITHROMAX) 250 MG tablet    Sig: Take 2 tablets on day 1, then 1 tablet daily on days 2 through 5    Dispense:  6 tablet    Refill:  0    Order Specific Question:   Supervising Provider    Answer:   Merrilee Jansky [[id number]]   benzonatate (TESSALON) 100 MG capsule     Sig: Take 1-2 capsules (100-200 mg total) by mouth 3 (three) times daily as needed.    Dispense:  30 capsule    Refill:  0    Order Specific Question:   Supervising Provider    Answer:   Merrilee Jansky [id number]     *If you need refills on other medications prior to your next appointment, please contact your pharmacy*  Follow-Up: Call back or seek an in-person evaluation if the symptoms worsen or if the condition fails to improve as anticipated.   Virtual Care [phone]  Other Instructions Sinus Infection, Adult A sinus infection, also called sinusitis, is inflammation of your sinuses. Sinuses are hollow spaces in the bones around your face. Your sinuses are located: Around your eyes. In the middle of your forehead. Behind your nose. In your cheekbones. Mucus normally drains out of your sinuses. When your nasal tissues become inflamed or swollen, mucus can become trapped or blocked. This allows bacteria, viruses, and fungi to grow, which leads to infection. Most infections of the sinuses are caused by a virus. A sinus infection can develop quickly. It can last for up to 4 weeks (acute) or for more than 12 weeks (chronic). A sinus infection often develops after a cold. What are the causes? This condition is caused by anything that creates swelling in the sinuses or stops mucus from draining. This includes: Allergies. Asthma. Infection from bacteria or viruses. Deformities or blockages in your nose or sinuses. Abnormal growths in the nose (nasal polyps). Pollutants, such as chemicals or irritants in the air. Infection from fungi. This is rare. What increases the risk? You are more likely to develop this condition if you: Have a weak body defense system (immune system). Do a lot of swimming or diving. Overuse nasal sprays. Smoke. What are the signs or symptoms? The main symptoms of this condition are pain and a feeling of pressure around the affected sinuses.  Other symptoms include: Stuffy nose or congestion that makes it difficult to breathe through your nose. Thick yellow or greenish drainage from your nose. Tenderness, swelling, and warmth over the affected sinuses. A cough that may get worse at night. Decreased sense of smell and taste. Extra mucus that collects in the throat or the back of the nose (postnasal drip) causing a sore throat or bad breath. Tiredness (fatigue). Fever. How is this diagnosed? This condition is diagnosed based on: Your symptoms. Your medical history. A physical exam. Tests to find out if your condition is acute or chronic. This may include: Checking your nose for nasal polyps. Viewing your sinuses using a device that has a light (endoscope). Testing for allergies or  bacteria. Imaging tests, such as an MRI or CT scan. In rare cases, a bone biopsy may be done to rule out more serious types of fungal sinus disease. How is this treated? Treatment for a sinus infection depends on the cause and whether your condition is chronic or acute. If caused by a virus, your symptoms should go away on their own within 10 days. You may be given medicines to relieve symptoms. They include: Medicines that shrink swollen nasal passages (decongestants). A spray that eases inflammation of the nostrils (topical intranasal corticosteroids). Rinses that help get rid of thick mucus in your nose (nasal saline washes). Medicines that treat allergies (antihistamines). Over-the-counter pain relievers. If caused by bacteria, your health care provider may recommend waiting to see if your symptoms improve. Most bacterial infections will get better without antibiotic medicine. You may be given antibiotics if you have: A severe infection. A weak immune system. If caused by narrow nasal passages or nasal polyps, surgery may be needed. Follow these instructions at home: Medicines Take, use, or apply over-the-counter and prescription medicines  only as told by your health care provider. These may include nasal sprays. If you were prescribed an antibiotic medicine, take it as told by your health care provider. Do not stop taking the antibiotic even if you start to feel better. Hydrate and humidify  Drink enough fluid to keep your urine pale yellow. Staying hydrated will help to thin your mucus. Use a cool mist humidifier to keep the humidity level in your home above 50%. Inhale steam for 10-15 minutes, 3-4 times a day, or as told by your health care provider. You can do this in the bathroom while a hot shower is running. Limit your exposure to cool or dry air. Rest Rest as much as possible. Sleep with your head raised (elevated). Make sure you get enough sleep each night. General instructions  Apply a warm, moist washcloth to your face 3-4 times a day or as told by your health care provider. This will help with discomfort. Use nasal saline washes as often as told by your health care provider. Wash your hands often with soap and water to reduce your exposure to germs. If soap and water are not available, use hand sanitizer. Do not smoke. Avoid being around people who are smoking (secondhand smoke). Keep all follow-up visits. This is important. Contact a health care provider if: You have a fever. Your symptoms get worse. Your symptoms do not improve within 10 days. Get help right away if: You have a severe headache. You have persistent vomiting. You have severe pain or swelling around your face or eyes. You have vision problems. You develop confusion. Your neck is stiff. You have trouble breathing. These symptoms may be an emergency. Get help right away. Call 911. Do not wait to see if the symptoms will go away. Do not drive yourself to the hospital. Summary A sinus infection is soreness and inflammation of your sinuses. Sinuses are hollow spaces in the bones around your face. This condition is caused by nasal tissues that  become inflamed or swollen. The swelling traps or blocks the flow of mucus. This allows bacteria, viruses, and fungi to grow, which leads to infection. If you were prescribed an antibiotic medicine, take it as told by your health care provider. Do not stop taking the antibiotic even if you start to feel better. Keep all follow-up visits. This is important. This information is not intended to replace advice given to you by  your health care provider. Make sure you discuss any questions you have with your health care provider. Document Revised: 05/23/2021 Document Reviewed: 05/23/2021 Elsevier Patient Education  2024 Elsevier Inc.    If you have been instructed to have an in-person evaluation today at a local Urgent Care facility, please use the link below. It will take you to a list of all of our available [place] [facility], including address, phone number and hours of operation. Please do not delay care.  [facility]  If you or a family member do not have a primary care provider, use the link below to schedule a visit and establish care. When you choose a [place] primary care physician or advanced practice provider, you gain a long-term partner in health. Find a Primary Care Provider  Learn more about [place]'s in-office and virtual care options: [place] - Get Care Now

## 2023-06-03 NOTE — Progress Notes (Signed)
Virtual Visit Consent   Destiny Flores, you are scheduled for a virtual visit with a [facility] provider today. Just as with appointments in the office, your consent must be obtained to participate. Your consent will be active for this visit and any virtual visit you may have with one of our providers in the next 365 days. If you have a MyChart account, a copy of this consent can be sent to you electronically.  As this is a virtual visit, video technology does not allow for your provider to perform a traditional examination. This may limit your provider's ability to fully assess your condition. If your provider identifies any concerns that need to be evaluated in person or the need to arrange testing (such as labs, EKG, etc.), we will make arrangements to do so. Although advances in technology are sophisticated, we cannot ensure that it will always work on either your end or our end. If the connection with a video visit is poor, the visit may have to be switched to a telephone visit. With either a video or telephone visit, we are not always able to ensure that we have a secure connection.  By engaging in this virtual visit, you consent to the provision of healthcare and authorize for your insurance to be billed (if applicable) for the services provided during this visit. Depending on your insurance coverage, you may receive a charge related to this service.  I need to obtain your verbal consent now. Are you willing to proceed with your visit today? Destiny Flores has provided verbal consent on 06/03/2023 for a virtual visit (video or telephone). Margaretann Loveless, PA-C  Date: 06/03/2023 11:50 AM  Virtual Visit via Video Note   I, Margaretann Loveless, connected with  Destiny Flores  ([id number], [birth year]) on 06/03/23 at 11:45 AM EST by a video-enabled telemedicine application and verified that I am speaking with the correct person using two identifiers.  Location: Patient: Virtual Visit Location  Patient: Home Provider: Virtual Visit Location Provider: Home Office   I discussed the limitations of evaluation and management by telemedicine and the availability of in person appointments. The patient expressed understanding and agreed to proceed.    History of Present Illness: Destiny Flores is a 42 y.o. who identifies as a female who was assigned female at birth, and is being seen today for sinus congestion.  HPI: Sinusitis This is a new problem. The current episode started 1 to 4 weeks ago (over a week). The problem has been gradually worsening since onset. There has been no fever. Associated symptoms include congestion, coughing, ear pain (right), headaches, shortness of breath (mild from nasal congestion), sinus pressure, sneezing and a sore throat. Pertinent negatives include no chills or hoarse voice. (post nasal drainage) Treatments tried: [place] brand cough and congestion, Mucinex. The treatment provided no relief.  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  Has patient had a PCN reaction causing immediate rash, facial/tongue/throat swelling, SOB or lightheadedness with hypotension: Yes Has patient had a PCN reaction causing severe rash involving mucus membranes or skin necrosis: No Has patient had a PCN reaction that required hospitalization: Yes Has patient had a PCN reaction occurring within the last 10 years: No If all of the above answers are "NO", then may proceed with Cephalosporin use. Has patient had a PCN reaction causing immediate rash, facial/tongue/throat swelling, SOB or lightheadedness with hypotension: Yes Has patient had a PCN reaction causing severe rash involving mucus membranes or skin necrosis: No Has patient had a PCN reaction that required hospitalization: Yes Has patient had a PCN reaction occurring within the last 10 years: No If all of the above answers are "NO", then may proceed with Cephalosporin use.   Sulfa Antibiotics Hives   Tape Other (See Comments)    Large Boils-PAPER TAPE OK TO USE Large Boils-PAPER TAPE OK TO USE   Medications:  Current Outpatient Medications:    ascorbic acid (VITAMIN C) 1000 MG tablet, Take by mouth., Disp: , Rfl:    Azelastine HCl 137 MCG/SPRAY SOLN, PLACE 2 SPRAYS INTO BOTH NOSTRILS 2 (TWO) TIMES DAILY AS NEEDED FOR RHINITIS. USE IN EACH NOSTRIL AS DIRECTED, Disp: 90 mL, Rfl: 1   azithromycin (ZITHROMAX) 250 MG tablet, Take 2 tablets on day 1, then 1 tablet daily on days 2 through 5, Disp: 6 tablet, Rfl: 0   benzonatate (TESSALON) 100 MG capsule, Take 1-2 capsules (100-200 mg total) by mouth 3 (three) times daily as needed., Disp: 30 capsule, Rfl: 0   Brivaracetam (BRIVIACT) 100 MG TABS, Take 1 tablet by mouth 2 (two) times daily., Disp: , Rfl:    clindamycin (CLEOCIN) 300 MG  capsule, Take 1 capsule (300 mg total) by mouth 2 (two) times daily. Take for 7 d for HS flares, Disp: 60 capsule, Rfl: 0   diazepam (VALIUM) 5 MG tablet, Take 1 tablet (5 mg total) by mouth every 12 (twelve) hours as needed for up to 1 dose for anxiety (preprocedural anxiety)., Disp: 1 tablet, Rfl: 0   dicyclomine (BENTYL) 10 MG capsule, Take 1 capsule (10 mg total) by mouth 4 (four) times daily -  before meals and at bedtime., Disp: 30 capsule, Rfl: 0   felbamate (FELBATOL) 600 MG tablet, Take 600 mg by mouth 3 (three) times daily., Disp: , Rfl:    fluconazole (DIFLUCAN) 150 MG tablet, Take 1 tablet (150 mg total) by mouth every 3 (three) days as needed for up to 10 doses (for vaginal itching/yeast infection sx)., Disp: 10 tablet, Rfl: 0   fluconazole (DIFLUCAN) 150 MG tablet, Take 1 tablet (150 mg total) by mouth every 3 (three) days as needed (for vaginal itching/yeast infection sx)., Disp: 2 tablet, Rfl: 0   HUMIRA, 2 PEN, 80 MG/0.8ML PNKT, Inject into the skin., Disp: , Rfl:    HYDROcodone-acetaminophen (NORCO/VICODIN) 5-325 MG tablet, Take 1 tablet by mouth every 4 (four) hours as needed for moderate pain., Disp: 12  tablet, Rfl: 0   levocetirizine (XYZAL) 5 MG tablet, TAKE 1 TABLET BY MOUTH EVERY DAY IN THE EVENING, Disp: 90 tablet, Rfl: 1   magnesium oxide (MAG-OX) 400 MG tablet, Take by mouth., Disp: , Rfl:    Melatonin 3 MG CAPS, Take by mouth., Disp: , Rfl:    metFORMIN (GLUCOPHAGE) 500 MG tablet, Take by mouth., Disp: , Rfl:    ondansetron (ZOFRAN-ODT) 4 MG disintegrating tablet, Take 1 tablet (4 mg total) by mouth every 8 (eight) hours as needed., Disp: 20 tablet, Rfl: 0   pantoprazole (PROTONIX) 40 MG tablet, Take 1 tablet (40 mg total) by mouth 2 (two) times daily., Disp: 180 tablet, Rfl: 1   pyridOXINE (VITAMIN B-6) 100 MG tablet, Take 100 mg by mouth daily., Disp: , Rfl:    spironolactone (ALDACTONE) 100 MG tablet, Take by mouth., Disp: , Rfl:    SUMAtriptan (IMITREX) 50 MG tablet,  Take 50 mg by mouth daily as needed., Disp: , Rfl:    topiramate (TOPAMAX) 25 MG tablet, Take 50 mg by mouth at bedtime., Disp: , Rfl:    TURMERIC CURCUMIN PO, Take 2 capsules by mouth daily., Disp: , Rfl:   Observations/Objective: Patient is well-developed, well-nourished in no acute distress.  Resting comfortably at home.  Head is normocephalic, atraumatic.  No labored breathing.  Speech is clear and coherent with logical content.  Patient is alert and oriented at baseline.    Assessment and Plan: 1. Acute bacterial sinusitis - azithromycin (ZITHROMAX) 250 MG tablet; Take 2 tablets on day 1, then 1 tablet daily on days 2 through 5  Dispense: 6 tablet; Refill: 0  2. Acute cough - benzonatate (TESSALON) 100 MG capsule; Take 1-2 capsules (100-200 mg total) by mouth 3 (three) times daily as needed.  Dispense: 30 capsule; Refill: 0  - Worsening symptoms that have not responded to OTC medications.  - Will give Zpack - Tessalon for cough - PLAIN Mucinex can be continued - Continue allergy medications.  - Steam and humidifier can help - Stay well hydrated and get plenty of rest.  - Seek in person evaluation if no symptom improvement or if symptoms worsen   Follow Up Instructions: I discussed the assessment and treatment plan with the patient. The patient was provided an opportunity to ask questions and all were answered. The patient agreed with the plan and demonstrated an understanding of the instructions.  A copy of instructions were sent to the patient via MyChart unless otherwise noted below.    The patient was advised to call back or seek an in-person evaluation if the symptoms worsen or if the condition fails to improve as anticipated.    Margaretann Loveless, PA-C

## 2023-06-04 ENCOUNTER — Ambulatory Visit: Payer: Medicaid Other | Admitting: Family Medicine

## 2023-06-10 ENCOUNTER — Ambulatory Visit: Admit: 2023-06-10 | Discharge: 2023-06-11 | Payer: BLUE CROSS/BLUE SHIELD

## 2023-06-10 DIAGNOSIS — L910 Hypertrophic scar: Principal | ICD-10-CM

## 2023-06-10 DIAGNOSIS — Z79899 Other long term (current) drug therapy: Principal | ICD-10-CM

## 2023-06-10 DIAGNOSIS — L732 Hidradenitis suppurativa: Principal | ICD-10-CM

## 2023-06-10 MED ORDER — LINEZOLID 600 MG TABLET
ORAL_TABLET | Freq: Two times a day (BID) | ORAL | 0 refills | 14 days | Status: CP
Start: 2023-06-10 — End: ?

## 2023-06-10 MED ADMIN — triamcinolone acetonide (KENALOG-40) injection 40 mg: 40 mg | @ 19:00:00 | Stop: 2023-06-10

## 2023-06-10 NOTE — Unmapped (Signed)
Dermatology Note    Assessment and Plan:      Hidradenitis Suppurativa (HS): [place]ley Stage 2, chronic, continues to flare around menses, not at patient goal  - S/p unroofing procedure in the L perianal area, R perianal area, and L labia majora (partial labial removal) on 12/04/2022  - previous treatments: Humira  - We discussed the typical natural history, pathogenesis, treatment options, and expected course as well as the relapsing and sometimes recalcitrant nature of the disease.    - Patient previously elected to avoid infliximab due to side effects  - Patient is unable to take rifampin because it interacts with her seizure medications (briviact and felbatol)  - Start secukinumab 300 mg/2 mL (150 mg/mL) PnIj; Inject 300 mg under the skin once a week. Loading doses - inject 300mg  weekly on week 0, 1, 2, 3, and 4 (5 weeks total)  - secukinumab 300 mg/2 mL (150 mg/mL) PnIj; Inject 300 mg under the skin every twenty-eight (28) days. Maintenance dosing.    - Continue metformin 500 mg BID. Side effects discussed.  - Continue spironolactone (ALDACTONE) 100 MG tablet; Take 2 tablets (200 mg total) by mouth daily.   - Start linezolid 600 MG tablet; Take 1 tablet (600 mg total) by mouth every 12 hours for 14 days.   - Offered ILK today. Patient elects to pursue per below:     Intralesional Kenalog Procedure Note: After the patient was informed of risks (including atrophy and dyspigmentation), benefits and side effects of intralesional steroid injection, the patient elected to undergo injection and verbal consent was obtained. Skin was cleaned with alcohol and injected intralesionally into the sites (below). The patient tolerated the procedure well without complications and was instructed on post-procedure care.  Location(s): L inguinal, R inguinal  Number of sites treated: 2  Kenalog (triamcinolone) Concentration: 40 mg/ml   Volume: 0.4 ml total      High risk medication use (initiating Cosentyx)  - Negative Quant gold obtained 07/03/2022  - Adalimumab/Adalimumab Ab slightly low 07/03/2022 so was unable to increase Humira dosage      The patient was advised to call for an appointment should any new, changing, or symptomatic lesions develop.     RTC: Return for next scheduled follow up. or sooner as needed   _________________________________________________________________      Chief Complaint     Chief Complaint   Patient presents with    Hidradenitis Suppurativa     Pt coming in for current flares between the groin and buttocks area.   States areas started draining over the weekend.        HPI     Erica Norman is a 42 y.o. female who presents as a returning patient (last seen by Dr. Janyth Contes on 05/06/2023) to Dermatology for follow up of hidradenitis suppurativa. At last visit, patient received ILK-40 to her buttocks and groin, was to start Cosentyx 300 mg weekly, continue metformin 500 mg twice daily, continue spironolactone 200 mg daily, continue clindamycin 300 mg twice daily, and stop Humira for her HS.     Today, patient reports worsening of her HS in her groin. Endorses drainage and tenderness in her groin. Endorses some tenderness in her axillae but does not find as bothersome. Notes that she has not been able to start Cosentyx due to insurance not approving it. Currently using metformin, spironolactone, and clindamycin with little relief.     Self-reported severity (0-5): 4  VAS pain today: 2  VAS average pain for the  last month: 5  Requiring pain medication? Yes.  If so, what type/frequency? Tylenol and Naproxen   How often in pain?  few times a month  Level of odor (0-5): 3  Level of itching (0-5): 1  Dressing changes needed for drainage:Twice a day  How much drainage: some drainage  Flare in the last month (Y/N)? Yes.  How long ago was the last flare? Not answered  Developing new lesions? once a month  Number of inflammatory lesions montly: 5-10  DLQI: 14  Current treatment: Not answered     How helpful is the current treatment in managing the following aspects of your disease?  Not at all helpful Somewhat helpful Very helpful   Pain   x     Decreasing length of flares   x     Decreasing new lesions x       Drainage   x     Decreasing frequency of flares   x     Decreasing severity of flares   x     Odor x          The patient denies any other new or changing lesions or areas of concern.     Pertinent Past Medical History     Disease course:  Year when symptoms first noticed: Age 61   Year of diagnosis: Age 9  Who diagnosed you? Dermatologist  Location of first symptoms: axillae  Typical involved areas include: groin  Typical number of inflammatory lesions each month at baseline (from first visit): three to five  Disease triggers: stress, sweat and menstrual cycle     Are menstrual cycles irregular when not on birth control? No.  Current form of contraception: None  Effect of hormonal contraception on disease: n/a  Flaring with menstrual cycle (before, during, or after?): during  Difficulty becoming pregnant? n/a  Pregnancy complications? never pregnant    How many children? 0  Better or worse with pregnancy?  never pregnant.  If so, worse during a specific trimester? never pregnant       Prior treatments:  Topical: Clobetasol  Systemic: Keflex, Doxycyline, Humira titrated up to 80mg  weekly, but stopped in summer 2024 due to insurance issues, Clindamycin  Past surgical procedures: Yes -bilateral wide excisions of the axilla healed well since 2008 or 2009.  She had more recent excisions in the bilateral inguinal folds in 2016-2018.  These have mostly healed well but she has some persistent areas in particular along the infra abdominal fold and one in the right inguinal fold  Past laser procedures: No     Past Medical History, Family History, Social History, Medication List, Allergies, and Problem List were reviewed in the rooming section of Epic.     ROS: Other than symptoms mentioned in the HPI, no fevers, chills, or other skin complaints    Physical Examination     Gen: Well-appearing patient, appropriate, interactive, in no acute distress  SKIN (Focal Skin Exam): Per patient request, examination of bilateral axillae, chest, abdomen, bilateral thighs, groin, buttocks, and external genitalia was performed    location Abscess Inflamed nodule Non-inflamed nodule Draining sinus Non-draining Sinus Hurley % scar   R axilla          L axilla          R inframammary          L inframammary          Intermammary          Pubic  R inguinal  1        R thigh          L inguinal  1   1     L thigh          Scrotum/Vulva          Perianal          R buttock          L buttock          Other (list)                      AN count (total sum of abscess and inflammatory nodule): 2  Pilonidal sinus? No    -sites not commented on demonstrate normal findings.    Scribe's Attestation: Elsie Stain, MD obtained and performed the history, physical exam and medical decision making elements that were entered into the chart.  Signed by Donnella Sham, Scribe, on June 10, 2023 at 1:29 PM.    ----------------------------------------------------------------------------------------------------------------------  June 10, 2023 2:29 PM. Documentation assistance provided by the Scribe. I was present during the time the encounter was recorded. The information recorded by the Scribe was done at my direction and has been reviewed and validated by me.  ----------------------------------------------------------------------------------------------------------------------       (Approved Template 03/14/2020)

## 2023-06-10 NOTE — Unmapped (Deleted)
Self-reported severity (0-5): 4  VAS pain today: 2  VAS average pain for the last month: 5  Requiring pain medication? Yes.  If so, what type/frequency? Tylenol and Naproxen   How often in pain?  few times a month  Level of odor (0-5): 3  Level of itching (0-5): 1  Dressing changes needed for drainage:Twice a day  How much drainage: some drainage  Flare in the last month (Y/N)? Yes.  How long ago was the last flare? {last flare:53297}  Developing new lesions? once a month  Number of inflammatory lesions montly: 5-10  DLQI: 14  Current treatment: ***     How helpful is the current treatment in managing the following aspects of your disease?  Not at all helpful Somewhat helpful Very helpful   Pain  x    Decreasing length of flares  x    Decreasing new lesions x     Drainage  x    Decreasing frequency of flares  x    Decreasing severity of flares  x    Odor x

## 2023-06-10 NOTE — Unmapped (Signed)
Meet your team:     Your intake nurse is: Verne Carrow     Please remember to fill out the survey you will receive after your visit. Your comments help [place] continue to improve our care.      Thanks in advance!      [facility] Clinical Staff      For your HS:    Inject the contents of 2 Cosentyx pens (300 mg) under the skin every week for 5 weeks as the loading dose. Then, inject the contents of 2 Cosentyx pens (300 mg) under the skin every 28 days as the maintenance dose.     Take metformin 500 mg twice daily.     Take 2 tablets of spironolactone (200 mg total) by mouth daily.     Take 1 tablet of linezolid (600 mg) every 12 hours for 2 weeks.     Today we did a steroid injection (triamcinolone injection). You can remove the bandaid at any time.     What is triamcinolone injection?  Triamcinolone is a steroid that prevents the release of substances in the body that cause inflammation.  Triamcinolone injection is used to treat many different types of inflammatory conditions, including severe allergic reactions, skin disorders, severe colitis, inflammation of the joints or tendons, blood cell disorders, inflammatory eye disorders, lung disorders, and problems caused by low adrenal gland hormones.  Triamcinolone is also used to treat certain skin disorders caused by autoimmune conditions such as lupus, psoriasis, lichen planus, and others.  How is triamcinolone injection given?  Triamcinolone injection is given through a needle and can be injected into different areas of the body: into a muscle, into the space around a joint or tendon, or into a lesion on the skin. A healthcare provider will give you this injection.  What are the possible side effects of triamcinolone injection?  Get emergency medical help if you have signs of an allergic reaction: hives; difficult breathing; swelling of your face, lips, tongue, or throat.  Call your doctor at once if you have:  blurred vision, tunnel vision, eye pain, or seeing halos around lights;  unusual changes in mood or behavior;    Common side effects of triamcinolone injections into skin lesions may include:  Skin changes (acne, redness, bruising, discoloration of the skin);  Local thinning of the skin and underlying fat (atrophy), this tends to improve with time  increased local hair growth, or thinning hair;  a wound that is slow to heal    This is not a complete list of side effects and others may occur. Call your doctor for medical advice about side effects. You may report side effects to FDA at 1-800-FDA-1088.     You are welcome to join the [facility] for HS of the [employer] group online at [email or web address] or in-person at our regular meetings.  You can textHS to [phone] for meeting reminders or join the group online for regular updates.  This can be a great opportunity to interact and learn from other patients and help work with the HS community.  We hope to see your there!    Hidradentis Suppurativa (pronounced ???high-drad-en-eye-tis/sup-your-uh-tee-vah???) is a chronic disease of hair follicles.  The lesions occur most commonly on areas of skin-to-skin contact: under the arms (axillary area), in the groin, around the buttocks, in the region around the anus and genitals, and on the skin between and under the breasts. In women, the underarms, groin, and breast areas are most commonly affected.  Men most often have HS lesions on the buttocks and under the arms and may also have HS at the back of the neck and behind and around the ears.    What does HS look and feel like?   The first thing that someone with HS notices is a tender, raised, red bump that looks like an under-the-skin pimple or boil. Sometimes HS lesions have two or more ???heads.???  In mild disease only an occasional boil or abscess may occur, but in more active disease there can be many new lesions every month.  Some abscesses can become larger and may open and drain pus.  Bleeding and increased odor can also occur. In severe disease, deeper abscesses develop and may connect with each other under the skin to form tunnel-like tracts (sinuses, fistulas).  These may drain constantly, or may temporarily improve and then usually begin draining again over time.  In people who have had sinus tracts for some time, scars form that feel like ropes under the skin. In the very worst cases, networks of sinus tracts can form deeper in the body, including the muscle and other tissues. Many people with severe HS have scars that can limit their ability to freely move their arms or legs, though this is very unlikely for most patients.     Clinicians usually classify or ???grade??? HS using the [facility] staging system according to the severity of the disease for each body location:   [place] stage I: one or more abscesses are present, but no sinus tracts have formed and no scars have developed   [place]ore[place] Ad[place] stage II: one or more abscesses are present that resolve and recur; on sinus tract can be present and scarring is seen   Doreene Adas stage III: many abscesses and more than one sinus tract is present with extensive scars.    What causes HS?  The cause of HS is not completely understood.  It seems to be a disorder of hair follicles and often many family members are affected so genetics probably play a strong role.  Bacteria are often present and may make the disease worse, but infection does not seem to be the main cause. Hormones are also likely play a role since the condition typically starts around puberty when hair follicles under the arms and in the groin start to change.  It can sometimes flare with menstrual cycles in women as well.  In most cases it lasts for decades and starts to improve to some extent in the late 30s and 40s as long as many fistulas have not already formed.  Women are three times more likely than men to develop HS.    Other factors are known to contribute to HS flaring or becoming worse, though they are likely not the main causes. The factors most commonly associated with HS include:   Cigarette smoking - Stopping smoking will likely not cure the disease, but likely is helpful in reducing how much and how often it flares and may prevent it from getting as bad over time.   Higher weight - HS may occur even in people that are not overweight, but it is much more common in patients that are.  There is some evidence that losing weight and eating a diet low in sugars and fats may be helpful in improving hidradenitis, though this is not helpful for everyone.  Working with a nutritionist may be an important way to help with this and is something your physician can help coordinate  Hidradenitis is not contagious.  It is not caused by a problem with personal hygiene or any other activity or behavior of those with the disease.    How can your doctor help you treat your hidradenitis?  Clinicians use both medication and surgery to treat HS. The choice of treatment--or combination of treatments--is made according to an individual patient???s needs. Clinicians consider several factors in determining the most appropriate plan for therapy:   Severity of disease - medications and some laser treatments are usually able to control disease best when fistulas are not present.  Fistulas typically require surgery.   Extent and location of disease   Chronicity (how often the lesions recur)    A number of different surgical methods have been developed that are useful for certain patients under particular circumstances. These can be done with local numbing and healing at home for some areas when disease is not too extensive with relatively brief recovery times.  In more extensive disease there may be a need for larger excisions under general anesthesia with healing time in the hospital and prolonged recovery periods for better disease control.      In addition, many medical treatments have been tried--some with more success than others. No medication is effective for all patients, and you and your doctor may have to try several different treatments or combinations of treatments before you find the treatment plan that works best for you.  The goals of therapy with medications that are either topical (used on the skin) or systemic (taken by mouth) are:  1. to clear the lesions or at least reduce their number and extent, and  2. to prevent new lesions from forming.  3. To reduce pain, drainage, and odor  Some of the types of medications commonly used are antibacterial skin washes and the topical antibiotics to prevent secondary infections and corticosteroid injections into the lesions to reduce inflammation.     Other medications that may be used include retinoids (similar to Accutane), drugs that effect how hormones and hair follicles interact, drugs that affect your immune system (such as methotrexate, adalimumab/Humira, and Remicaid/infliximab), steroids, and oral antibiotics.    Lasers that destroy hair follicles can also be helpful since they reduce the hair follicles that cause the problems.  Multiple treatments are typically required over time and there is some discomfort associated with treatment, but it is typically very fast and well-tolerated.    It is very important to realize that hidradenitis cannot usually be completely cured with any single medication or surgical procedure.  It is a disease that can be very stubborn and difficult to control, but with good treatment a lot of improvement and sometimes temporary remissions can be obtained. Poorly controlled disease can cause more fistulas to form and make managing the disease much more difficult over time so it is important to seek care to reduce major flares.  Surgery can provide a long term cure in some areas, though the disease can start again or continue in nearby areas.  A dermatologist is often the best person to help coordinate disease treatment, and sometimes other surgeons, pain specialists, other specialists, and nutritionists may be part of the treatment team.    For severe disease, the first goal is often to reduce pain and symptoms with medicines so that the disease feels more stable. Once it's stable, we often start thinking about how to address areas that have completely gotten better with surgery if they are still causing problems.  What can you do to help your HS?  1. Stopping smoking is hard and may not fix everything, but it may be a step in the right direction.  We or your primary care physician can provide resources to help stop if you are interested.  2. Follow a healthy diet and try to achieve a healthy weight.  Some other self-help measures are:   Keep your skin cool and dry (becoming overheated and sweating can contribute to an HS flare)   To reduce the pain of cysts or nodules or to help them to drain, apply hot compresses or soak in hot water for 10 minutes at a time (use a clean washcloth or a teabag soaked in hot water)   For female patients, cotton underwear that does not have tight elastic in the groin can be helpful.  Boyshort, brief, or boxer style underwear may be a better option as friction on hair follicles in affected areas can be a major trigger in some patients.  These can be easily found on [place] or with some retailers.  Fruit of the Loom and Underworks are two brands that are sometimes recommended.    Finally, know that you are not alone. Coping with the pain and other symptoms of HS can be very difficult, so it may be helpful to connect with others who live with HS. Patient groups and networks can be sources of important information and support. Some internet resources for information and connections are provided below.    Psychologytoday.com is a resource to find psychologists and therapists that can help support you in your are     [email or web address] can help connect with sexual health resources and counselors    Resources for Information    The Hidradenitis Suppurativa Foundation: A nonprofit organized by a group of physicians interested in treating and advancing research in hidradenitis suppurativa.  This group advocates for better care and research for hidradenitis and has educational materials put together specifically for patients that have been reviewed and produced by doctors and people with hidradenitis.    American Academy of Dermatology  ARanked.fi    [employer]  ElevatorPitchers.de.html  [place]: [employer]  https://www.rarediseases.org/rare-disease-information/rare-diseases/byID/358/viewAbstract  Trials of new medications for HS  Https://www.clinicaltrials.gov

## 2023-06-14 ENCOUNTER — Other Ambulatory Visit: Payer: Self-pay | Admitting: Family Medicine

## 2023-07-05 ENCOUNTER — Encounter: Payer: Self-pay | Admitting: Physician Assistant

## 2023-07-05 ENCOUNTER — Ambulatory Visit: Payer: Medicaid Other | Admitting: Physician Assistant

## 2023-07-05 VITALS — BP 120/72 | HR 82 | Resp 16 | Ht 65.0 in | Wt 216.0 lb

## 2023-07-05 DIAGNOSIS — E782 Mixed hyperlipidemia: Secondary | ICD-10-CM | POA: Diagnosis not present

## 2023-07-05 DIAGNOSIS — E559 Vitamin D deficiency, unspecified: Secondary | ICD-10-CM

## 2023-07-05 DIAGNOSIS — G40909 Epilepsy, unspecified, not intractable, without status epilepticus: Secondary | ICD-10-CM

## 2023-07-05 DIAGNOSIS — L732 Hidradenitis suppurativa: Secondary | ICD-10-CM

## 2023-07-05 DIAGNOSIS — G809 Cerebral palsy, unspecified: Secondary | ICD-10-CM

## 2023-07-05 DIAGNOSIS — K219 Gastro-esophageal reflux disease without esophagitis: Secondary | ICD-10-CM | POA: Diagnosis not present

## 2023-07-05 MED ORDER — PANTOPRAZOLE SODIUM 40 MG PO TBEC: 40.0000 mg | DELAYED_RELEASE_TABLET | Freq: Two times a day (BID) | ORAL | 0 refills | Status: DC

## 2023-07-05 MED ORDER — SUMATRIPTAN SUCCINATE 50 MG PO TABS: 50.0000 mg | ORAL_TABLET | Freq: Every day | ORAL | 1 refills | Status: AC | PRN

## 2023-07-05 NOTE — Assessment & Plan Note (Signed)
 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

## 2023-07-05 NOTE — Assessment & Plan Note (Signed)
 Chronic, stable She denies concerns today She is seen by neurology regularly for surveillance Continue collaboration with specialty

## 2023-07-05 NOTE — Assessment & Plan Note (Signed)
 Chronic, ongoing She is managed by Dermatology for this and taking Spironolactone, Metformin,  They are trying to get her on Cosentyx but insurance coverage is complicating this She also has Clindamycin  for acute flares Continue regimen per Dermatology recommendations Follow up in 6 months or sooner if concerns arise

## 2023-07-05 NOTE — Progress Notes (Signed)
 Established Patient Office Visit  Name: Destiny Flores   MRN: [id number]    DOB: 02-Jan-1981   Date:07/05/2023  Today's Provider: [place], MHS, PA-C Introduced myself to the patient as a PA-C and provided education on APPs in clinical practice.         Subjective  Chief Complaint  Chief Complaint  Patient presents with   Medical Management of Chronic Issues    6 months    HPI   GERD GERD control status: stable Satisfied with current treatment? yes Heartburn frequency: infrequent - well controlled with Protonix   Medication side effects: no  Medication compliance: stable Antacid use frequency:  rarely uses Pepto if she forgets to take Protonix   Dysphagia: no Odynophagia:  no Hematemesis: no Blood in stool: no   Hidradenitis Suppurativa  She is seeing Dermatology for this  Most recent apt with them was 06/10/23 She is taking Metformin, Spironolactone,  She takes Clindamycin  as needed for flares   Seizures   She is followed by Neurology  Currently satisfied with regimen and reports last unprovoked seizure was March 2020  She reports last one was provoked when she took Xyzal  in Jan    Patient Active Problem List   Diagnosis Date Noted   Sprain of right ankle 12/06/2021   History of kidney stones 10/17/2021   Allergic rhinitis 10/31/2020   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    AXILLARY HIDRADENITIS EXCISION     MULTIPLE HYDRADENITIS SURGERIES IN [place]     hidradenitis groin Left 04/2017   last surgery done on groin and the symptoms have returned   HYDRADENITIS EXCISION N/A 01/16/2018   Procedure: EXCISION HIDRADENITIS GROIN;  Surgeon: Jordis Laneta FALCON, MD;  Location: [facility] ORS;  Service: General;  Laterality: N/A;   KNEE SURGERY Left 2010   tendon and meniscus repair   NOSE SURGERY  1998   deviated septum and sinus repair   ROBOTIC ASSISTED LAPAROSCOPIC CHOLECYSTECTOMY N/A 07/24/2018   Procedure: ROBOTIC ASSISTED LAPAROSCOPIC CHOLECYSTECTOMY;  Surgeon: Jordis Laneta FALCON, MD;  Location: [facility] ORS;  Service: General;  Laterality: N/A;   TONSILLECTOMY     TYMPANOSTOMY TUBE PLACEMENT     as a kid   VAGUS NERVE STIMULATOR GENERATOR CHANGE  03/2015    Family History  Problem Relation Age of Onset   Hyperlipidemia Father    Epilepsy Brother    ADD / ADHD Brother    Breast cancer Paternal Grandmother 16       Passed away at 2yo   Asthma Mother     Social History   Tobacco Use   Smoking status: Never   Smokeless tobacco: Never  Substance Use Topics   Alcohol use: Never     Current Outpatient Medications:    ascorbic acid (VITAMIN C) 1000 MG tablet, Take by mouth., Disp: , Rfl:    benzonatate  (TESSALON ) 100 MG capsule, Take 1-2 capsules (100-200 mg total) by mouth 3 (three) times daily as needed., Disp: 30 capsule, Rfl: 0   Brivaracetam (BRIVIACT)  100 MG TABS, Take 1 tablet by mouth 2 (two) times daily., Disp: , Rfl:    clindamycin  (CLEOCIN ) 300 MG capsule, Take 1 capsule (300 mg total) by mouth 2 (two) times daily. Take for 7 d for HS flares, Disp: 60 capsule, Rfl: 0   felbamate  (FELBATOL ) 600 MG tablet, Take 600 mg by mouth 3 (three) times daily., Disp: , Rfl:    HUMIRA, 2 PEN, 80 MG/0.8ML PNKT, Inject into the skin., Disp: , Rfl:    HYDROcodone -acetaminophen  (NORCO/VICODIN) 5-325 MG tablet, Take 1 tablet by mouth every 4 (four) hours as needed for moderate pain., Disp: 12 tablet, Rfl: 0   magnesium oxide (MAG-OX) 400 MG tablet, Take by  mouth., Disp: , Rfl:    Melatonin 3 MG CAPS, Take by mouth., Disp: , Rfl:    ondansetron  (ZOFRAN -ODT) 4 MG disintegrating tablet, Take 1 tablet (4 mg total) by mouth every 8 (eight) hours as needed., Disp: 20 tablet, Rfl: 0   pyridOXINE (VITAMIN B-6) 100 MG tablet, Take 100 mg by mouth daily., Disp: , Rfl:    spironolactone (ALDACTONE) 100 MG tablet, Take by mouth., Disp: , Rfl:    topiramate (TOPAMAX) 25 MG tablet, Take 50 mg by mouth at bedtime., Disp: , Rfl:    TURMERIC CURCUMIN PO, Take 2 capsules by mouth daily., Disp: , Rfl:    metFORMIN (GLUCOPHAGE) 500 MG tablet, Take by mouth., Disp: , Rfl:    pantoprazole  (PROTONIX ) 40 MG tablet, Take 1 tablet (40 mg total) by mouth 2 (two) times daily., Disp: 180 tablet, Rfl: 0   SUMAtriptan  (IMITREX ) 50 MG tablet, Take 1 tablet (50 mg total) by mouth daily as needed., Disp: 10 tablet, Rfl: 1  Allergies  Allergen Reactions   Codeine Anaphylaxis   Penicillins Anaphylaxis and Other (See Comments)    Has patient had a PCN reaction causing immediate rash, facial/tongue/throat swelling, SOB or lightheadedness with hypotension: Yes Has patient had a PCN reaction causing severe rash involving mucus membranes or skin necrosis: No Has patient had a PCN reaction that required hospitalization: Yes Has patient had a PCN reaction occurring within the last 10 years: No If all of the above answers are NO, then may proceed with Cephalosporin use.  Has patient had a PCN reaction causing immediate rash, facial/tongue/throat swelling, SOB or lightheadedness with hypotension: Yes Has patient had a PCN reaction causing severe rash involving mucus membranes or skin necrosis: No Has patient had a PCN reaction that required hospitalization: Yes Has patient had a PCN reaction occurring within the last 10 years: No If all of the above answers are NO, then may proceed with Cephalosporin use. Has patient had a PCN reaction causing immediate rash, facial/tongue/throat  swelling, SOB or lightheadedness with hypotension: Yes Has patient had a PCN reaction causing severe rash involving mucus membranes or skin necrosis: No Has patient had a PCN reaction that required hospitalization: Yes Has patient had a PCN reaction occurring within the last 10 years: No If all of the above answers are NO, then may proceed with Cephalosporin use.   Sulfa Antibiotics Hives   Tape Other (See Comments)    Large Boils-PAPER TAPE OK TO USE Large Boils-PAPER TAPE OK TO USE    I personally reviewed active problem list, medication list, allergies, health maintenance, notes from last encounter, notes from last 3 encounters, lab results with the patient/caregiver today.   ROS  See HPI for relevant ROS   Objective  Vitals:   07/05/23 0914  BP: 120/72  Pulse: 82  Resp: 16  SpO2: 100%  Weight: 216 lb (98 kg)  Height: 5' 5 (1.651 m)    Body mass index is 35.94 kg/m.  Physical Exam Vitals reviewed.  Constitutional:      General: She is awake.     Appearance: Normal appearance. She is well-developed and well-groomed.  HENT:     Head: Normocephalic and atraumatic.  Cardiovascular:     Rate and Rhythm: Normal rate and regular rhythm.     Pulses: Normal pulses.          Radial pulses are 2+ on the right side and 2+ on the left side.     Heart sounds: Normal heart sounds. No murmur heard.    No friction rub. No gallop.  Pulmonary:     Effort: Pulmonary effort is normal.     Breath sounds: Normal breath sounds. No decreased air movement. No decreased breath sounds, wheezing, rhonchi or rales.  Musculoskeletal:     Cervical back: Normal range of motion.     Right lower leg: No edema.     Left lower leg: No edema.  Neurological:     General: No focal deficit present.     Mental Status: She is alert and oriented to person, place, and time. Mental status is at baseline.     GCS: GCS eye subscore is 4. GCS verbal subscore is 5. GCS motor subscore is 6.  Psychiatric:         Attention and Perception: Attention and perception normal.        Mood and Affect: Mood and affect normal.        Speech: Speech normal.        Behavior: Behavior normal. Behavior is cooperative.        Thought Content: Thought content normal.        Cognition and Memory: Cognition normal.      No results found for this or any previous visit (from the past 2160 hours).   PHQ2/9:    01/17/2023   10:03 AM 12/03/2022    9:12 AM 11/16/2022    9:20 AM 10/31/2022   10:22 AM 10/04/2022    8:35 AM  Depression screen PHQ 2/9  Decreased Interest 0 0 0 2 0  Down, Depressed, Hopeless 0 0 0 0 0  PHQ - 2 Score 0 0 0 2 0  Altered sleeping 0 0 0 2   Tired, decreased energy 0 0 0 2   Change in appetite 0 0 0 1   Feeling bad or failure about yourself  0 0 0 0   Trouble concentrating 0 0 0 0   Moving slowly or fidgety/restless 0 0 0 0   Suicidal thoughts 0 0 0 0   PHQ-9 Score 0 0 0 7   Difficult doing work/chores Not difficult at all Not difficult at all Not difficult at all Not difficult at all       Fall Risk:    07/05/2023    9:13 AM 01/17/2023   10:03 AM 12/03/2022    9:12 AM 11/16/2022    9:20 AM 10/31/2022   10:22 AM  Fall Risk   Falls in the past year? 0 1 1 1 1   Number falls in past yr: 0 0 0 0 0  Injury with Fall? 0 1 1 1 1   Risk for fall due to : No Fall Risks Impaired balance/gait Impaired balance/gait Impaired balance/gait History of fall(s)  Follow up Falls prevention discussed  Falls prevention discussed;Education provided;Falls evaluation completed Falls prevention discussed;Falls evaluation completed;Education provided Falls prevention discussed;Education provided;Falls evaluation completed Falls prevention discussed;Education provided;Falls evaluation completed      Functional Status Survey:      Assessment & Plan  Problem List Items Addressed This Visit       Digestive   Gastroesophageal reflux disease without esophagitis - Primary   Chronic, stable She  reports good control with current regimen comprised of Pantoprazole  40 mg PO BID  Continue current regimen- may use antacids as needed  Recommend avoiding trigger foods and drinks for continued management Refills provided today Follow up in 6 months or sooner if concerns arise        Relevant Medications   pantoprazole  (PROTONIX ) 40 MG tablet     Nervous and Auditory   Cerebral palsy (HCC)   Chronic, stable She denies concerns today She is seen by neurology regularly for surveillance Continue collaboration with specialty       Relevant Orders   CBC w/Diff/Platelet   COMPLETE METABOLIC PANEL WITH GFR   Epilepsy (HCC)   Chronic, historic condition, stable Appears well managed on current regimen overseen by Neurology  She reports her last unprovoked seizure was in March of 2020  Continue regimen per neurology recommendations Follow up in 6 months or sooner if concerns arise        Relevant Medications   SUMAtriptan  (IMITREX ) 50 MG tablet     Musculoskeletal and Integument   Hidradenitis suppurativa   Chronic, ongoing She is managed by Dermatology for this and taking Spironolactone, Metformin,  They are trying to get her on Cosentyx but insurance coverage is complicating this She also has Clindamycin  for acute flares Continue regimen per Dermatology recommendations Follow up in 6 months or sooner if concerns arise         Relevant Orders   CBC w/Diff/Platelet   COMPLETE METABOLIC PANEL WITH GFR     Other   Vitamin D  deficiency   Recheck vitamin d   Results to dictate further management        Relevant Orders   Vitamin D  (25 hydroxy)   Hyperlipidemia   Chronic, historic  She is not on cholesterol medication at this time Recheck lipid panel today. Results to dictate further management  Follow up in 6 months or sooner if concerns arise        Relevant Orders   Lipid Profile     Return in about 6 months (around 01/02/2024) for HD, HLD, Seizures, GERD  .   I, Asheton Viramontes E Corena Tilson, PA-C, have reviewed all documentation for this visit. The documentation on 07/05/23 for the exam, diagnosis, procedures, and orders are all accurate and complete.   [place], MHS, PA-C [facility]

## 2023-07-05 NOTE — Assessment & Plan Note (Signed)
 Chronic, stable She reports good control with current regimen comprised of Pantoprazole  40 mg PO BID  Continue current regimen- may use antacids as needed  Recommend avoiding trigger foods and drinks for continued management Refills provided today Follow up in 6 months or sooner if concerns arise

## 2023-07-05 NOTE — Assessment & Plan Note (Signed)
 Recheck vitamin d  Results to dictate further management

## 2023-07-05 NOTE — Assessment & Plan Note (Signed)
 Chronic, historic  She is not on cholesterol medication at this time Recheck lipid panel today. Results to dictate further management  Follow up in 6 months or sooner if concerns arise

## 2023-07-06 LAB — LIPID PANEL
Cholesterol: 241 mg/dL — ABNORMAL HIGH (ref <200)
HDL: 50 mg/dL (ref >=50)
LDL Cholesterol (Calc): 167 mg/dL — ABNORMAL HIGH
Non-HDL Cholesterol (Calc): 191 mg/dL — ABNORMAL HIGH (ref <130)
Total CHOL/HDL Ratio: 4.8 (calc) (ref <5.0)
Triglycerides: 117 mg/dL (ref <150)

## 2023-07-06 LAB — CBC WITH DIFFERENTIAL/PLATELET
Absolute Lymphocytes: 2884 {cells}/uL (ref 850–3900)
Absolute Monocytes: 897 {cells}/uL (ref 200–950)
Basophils Absolute: 69 {cells}/uL (ref 0–200)
Basophils Relative: 0.5 %
Eosinophils Absolute: 290 {cells}/uL (ref 15–500)
Eosinophils Relative: 2.1 %
HCT: 40.2 % (ref 35.0–45.0)
Hemoglobin: 13.3 g/dL (ref 11.7–15.5)
MCH: 30 pg (ref 27.0–33.0)
MCHC: 33.1 g/dL (ref 32.0–36.0)
MCV: 90.7 fL (ref 80.0–100.0)
MPV: 9.2 fL (ref 7.5–12.5)
Monocytes Relative: 6.5 %
Neutro Abs: 9660 {cells}/uL — ABNORMAL HIGH (ref 1500–7800)
Neutrophils Relative %: 70 %
Platelets: 584 10*3/uL — ABNORMAL HIGH (ref 140–400)
RBC: 4.43 10*6/uL (ref 3.80–5.10)
RDW: 12.8 % (ref 11.0–15.0)
Total Lymphocyte: 20.9 %
WBC: 13.8 10*3/uL — ABNORMAL HIGH (ref 3.8–10.8)

## 2023-07-06 LAB — COMPLETE METABOLIC PANEL WITH GFR
AG Ratio: 1.5 (calc) (ref 1.0–2.5)
ALT: 15 U/L (ref 6–29)
AST: 14 U/L (ref 10–30)
Albumin: 4.1 g/dL (ref 3.6–5.1)
Alkaline phosphatase (APISO): 82 U/L (ref 31–125)
BUN: 17 mg/dL (ref 7–25)
CO2: 22 mmol/L (ref 20–32)
Calcium: 9.3 mg/dL (ref 8.6–10.2)
Chloride: 105 mmol/L (ref 98–110)
Creat: 0.69 mg/dL (ref 0.50–0.99)
Globulin: 2.7 g/dL (ref 1.9–3.7)
Glucose, Bld: 92 mg/dL (ref 65–99)
Potassium: 4.4 mmol/L (ref 3.5–5.3)
Sodium: 138 mmol/L (ref 135–146)
Total Bilirubin: 0.2 mg/dL (ref 0.2–1.2)
Total Protein: 6.8 g/dL (ref 6.1–8.1)
eGFR: 110 mL/min/{1.73_m2} (ref >=60)

## 2023-07-06 LAB — VITAMIN D 25 HYDROXY (VIT D DEFICIENCY, FRACTURES): Vit D, 25-Hydroxy: 22 ng/mL — ABNORMAL LOW (ref 30–100)

## 2023-07-09 NOTE — Progress Notes (Signed)
 Your labs are back Your CBC does demonstrate elevated white blood cell count.  This does appear to be around the normal range for you compared to previous labs. Your cholesterol is elevated and appears to be increased from previous labs.  I recommend a trial of diet and exercise to see if we can control this without starting medication.  This would include reducing saturated fats in your daily diet and increasing good fats such as those from olive oil, tree nuts, avocado, etc.  I also recommend increasing your weekly exercise to include at least 150 minutes of moderate intensity physical activity.  If these measures do not improve your cholesterol over the next 6 months I recommend starting a medication to manage your cholesterol Your vitamin D  is low.  I recommend that you start supplementing with about 800 units of vitamin D  per day.  We can recheck this in about 3 months to make sure that it is improving. Your electrolytes, liver and kidney function are overall in normal ranges. Please let us  know if you have further questions or concerns

## 2023-07-19 NOTE — Unmapped (Signed)
[facility] Specialty Medication Onboarding    Specialty Medication: Cosentyx  Prior Authorization: Approved   Financial Assistance: No - copay  <$25  Final Copay/Day Supply: $4 / 35 days (LD) & 28 days (MD)    Insurance Restrictions: None     Notes to Pharmacist:   Credit Card on File: no  Start Date on Rx:      The triage team has completed the benefits investigation and has determined that the patient is able to fill this medication at [facility]. Please contact the patient to complete the onboarding or follow up with the prescribing physician as needed.

## 2023-07-23 NOTE — Unmapped (Unsigned)
[facility]    Patient Onboarding/Medication Counseling    Erica Norman is a 43 y.o. female with hidradenitis suppurativa who I am counseling today on initiation of therapy.  I am speaking to the patient.    Was a Nurse, learning disability used for this call? No    Verified patient's date of birth / HIPAA.    Specialty medication(s) to be sent: Inflammatory Disorders: Cosentyx      Non-specialty medications/supplies to be sent: declined sharps kit      Medications not needed at this time: na         Cosentyx (secukinumab)    Medication & Administration     Dosage: Hidradenitis Suppurativa: Inject 300mg  under the skin at weeks 0, 1, 2, 3, and 4 followed by 300 mg every 4 weeks; consider an increase to 300 mg every 2 weeks in patients who have an inadequate response      Lab tests required prior to treatment initiation:  Tuberculosis: Tuberculosis screening resulted in a non-reactive Quantiferon TB Gold assay.      Administration:     Prefilled Unoready?? auto-injector pen  Gather all supplies needed for injection on a clean, flat working surface: medication pen removed from packaging, alcohol swab, sharps container, etc.  Look at the medication label - look for correct medication, correct dose, and check the expiration date  Look at the medication - the liquid visible in the window on the side of the pen device should appear clear and colorless to slightly yellow  Lay the auto-injector pen on a flat surface and allow it to warm up to room temperature for at least  30-45 minutes  Select injection site - you can use the front of your thigh or your belly (but not the area 2 inches around your belly button); if someone else is giving you the injection you can also use your upper arm in the skin covering your triceps muscle  Prepare injection site - wash your hands and clean the skin at the injection site with an alcohol swab and let it air dry, do not touch the injection site again before the injection  Pull off the purple safety cap in the direction of the arrow, do not remove until immediately prior to injection and do not touch the red needle cover  Put the red needle cover against your skin at the injection site at a 90 degree angle, hold the pen such that you can see the clear medication window  Press down and hold the pen firmly against your skin, there will be a click when the injection starts  Continue to hold the pen firmly against your skin for about 10-15 seconds - the window will start to turn solid green  There will be a second click sound when the injection is almost complete, verify the window is solid green to indicate the injection is complete and then pull the pen away from your skin  Dispose of the used auto-injector pen immediately in your sharps disposal container the needle will be covered automatically  If you see any blood at the injection site, press a cotton ball or gauze on the site and maintain pressure until the bleeding stops, do not rub the injection site      Adherence/Missed dose instructions:  If your injection is given more than 4 days after your scheduled injection date - consult your pharmacist for additional instructions on how to adjust your dosing schedule.  Goals of Therapy     Ankylosing spondylitis  Relief of symptoms  Maintenance of function  Prevention of complications of spinal disease  Minimization of extraspinal and extraarticular manifestations and comorbidities  Maintenance of effective psychosocial functioning    Plaque Psoriasis  Minimize areas of skin involvement (% BSA)  Avoidance of long term glucocorticoid use  Maintenance of effective psychosocial functioning    Hidradenitis Suppurativa  Reduce the frequency and severity of new lesions  Minimize pain and suppuration  Prevent disease progression and limit scarring  Maintenance of effective psychosocial functioning    Psoriatic arthritis  Achieve remission/inactive disease or low/minimal disease activity  Maintenance of function  Minimization of systemic manifestations and comorbidities  Maintenance of effective psychosocial functioning      Side Effects & Monitoring Parameters     Injection site reaction (redness, irritation, inflammation localized to the site of administration)  Signs of a common cold - minor sore throat, runny or stuffy nose, etc.  Diarrhea    The following side effects should be reported to the provider:  Signs of a hypersensitivity reaction - rash; hives; itching; red, swollen, blistered, or peeling skin; wheezing; tightness in the chest or throat; difficulty breathing, swallowing, or talking; swelling of the mouth, face, lips, tongue, or throat; etc.  Reduced immune function - report signs of infection such as fever; chills; body aches; very bad sore throat; ear or sinus pain; cough; more sputum or change in color of sputum; pain with passing urine; wound that will not heal, etc.  Also at a slightly higher risk of some malignancies (mainly skin and blood cancers) due to this reduced immune function.  In the case of signs of infection - the patient should hold the next dose of Cosentyx?? and call your primary care provider to ensure adequate medical care.  Treatment may be resumed when infection is treated and patient is asymptomatic.  Muscle pain or weakness  Shortness of breath      Warnings, Precautions, & Contraindications     Have your bloodwork checked as you have been told by your prescriber  Talk with your doctor if you are pregnant, planning to become pregnant, or breastfeeding  Discuss the possible need for holding your dose(s) of Cosentyx?? when a planned procedure is scheduled with the prescriber as it may delay healing/recovery timeline       Drug/Food Interactions     Medication list reviewed in Epic. The patient was instructed to inform the care team before taking any new medications or supplements. No drug interactions identified.   If you have a latex allergy use caution when handling, the needle cap of the Cosentyx?? prefilled syringe and the safety cap for the Cosentyx Sensoready?? pen contains a derivative of natural rubber latex. Unoready?? pen does NOT contain latex.   Talk with you prescriber or pharmacist before receiving any live vaccinations while taking this medication and after you stop taking it      Storage, Handling Precautions, & Disposal     Store this medication in the refrigerator.  Do not freeze  May store intact Sensoready pens and 150 mg/mL prefilled syringes at <=30??C (<=86??F) for up to 4 days; may return to the refrigerator if unused  Store in original packaging, protected from light  Do not shake  Dispose of used syringes/pens in a sharps disposal container           Current Medications (including OTC/herbals), Comorbidities and Allergies     Current Outpatient Medications  Medication Sig Dispense Refill    adalimumab (HUMIRA,CF, PEN) 80 mg/0.8 mL PnKt Inject the contents of 1 pen (80 mg total) under the skin every seven (7) days. 4 each 11    ascorbic acid, vitamin C, (VITAMIN C) 1000 MG tablet Take 1 tablet (1,000 mg total) by mouth.      brivaracetam (BRIVIACT) 50 mg tablet Take 1 tablet (50 mg total) by mouth.      empty container Misc Use as directed to dispose of Humira pens. 1 each 2    FELBATOL 600 mg tablet       HYDROcodone-acetaminophen (NORCO) 5-325 mg per tablet Take 1-2 tablets by mouth every six (6) hours as needed for pain. 15 tablet 0    linezolid (ZYVOX) 600 mg tablet Take 1 tablet (600 mg total) by mouth every twelve (12) hours. For 2 weeks. 28 tablet 0    magnesium oxide (MAG-OX) 400 mg (241.3 mg magnesium) tablet Take 1 tablet (400 mg total) by mouth daily.      melatonin 5 mg tablet Take 1.5 tablets (7.5 mg total) by mouth.      metFORMIN (GLUCOPHAGE) 500 MG tablet Take 1 tablet (500 mg total) by mouth in the morning and 1 tablet (500 mg total) in the evening. Take with meals. 180 tablet 3    pantoprazole (PROTONIX) 40 MG tablet 20 mg. secukinumab 300 mg/2 mL (150 mg/mL) PnIj Loading doses - inject the contents of 1 pen (300mg ) under the skin weekly on week 0, 1, 2, 3, and 4 (5 weeks total) 10 mL 0    secukinumab 300 mg/2 mL (150 mg/mL) PnIj Inject the contents of 1 pen (300 mg) under the skin every twenty-eight (28) days. Maintenance dosing. 2 mL 11    sodium chloride (AYR) 0.65 % Drop 1 spray into each nostril.      spironolactone (ALDACTONE) 100 MG tablet Take 2 tablets (200 mg total) by mouth daily. If you develop lightheadedness, decrease to 1.5 tablets daily 180 tablet 3    topiramate (TOPAMAX) 25 MG tablet Take 2 tablets (50 mg total) by mouth.      turmeric, bulk, 95 % Powd Take 2 capsules by mouth.       No current facility-administered medications for this visit.       Allergies   Allergen Reactions    Codeine Anaphylaxis    Penicillins Anaphylaxis and Other (See Comments)     Has patient had a PCN reaction causing immediate rash, facial/tongue/throat swelling, SOB or lightheadedness with hypotension: Yes  Has patient had a PCN reaction causing severe rash involving mucus membranes or skin necrosis: No  Has patient had a PCN reaction that required hospitalization: Yes  Has patient had a PCN reaction occurring within the last 10 years: No  If all of the above answers are NO, then may proceed with Cephalosporin use.  Has patient had a PCN reaction causing immediate rash, facial/tongue/throat swelling, SOB or lightheadedness with hypotension: Yes  Has patient had a PCN reaction causing severe rash involving mucus membranes or skin necrosis: No  Has patient had a PCN reaction that required hospitalization: Yes  Has patient had a PCN reaction occurring within the last 10 years: No  If all of the above answers are NO, then may proceed with Cephalosporin use.      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    Appropriateness of Therapy     Acute infections noted within Epic:  No active infections  Patient reported infection: None    Is the medication and dose appropriate based on diagnosis, medication list, comorbidities, allergies, medical history, patient???s ability to self-administer the medication, and therapeutic goals? Yes    Prescription has been clinically reviewed: Yes      Baseline Quality of Life Assessment      How many days over the past month did your HS  keep you from your normal activities? For example, brushing your teeth or getting up in the morning. Patient declined to answer    Financial Information     Medication Assistance provided: Prior Authorization    Anticipated copay of $4 reviewed with patient. Verified delivery address.    Delivery Information     Scheduled delivery date: 1/24    Expected start date: 1/24      Medication will be delivered via Same Day Courier to the prescription address in [facility].  This shipment will not require a signature.      Explained the services we provide at [facility] and that each month we would call to set up refills.  Stressed importance of returning phone calls so that we could ensure they receive their medications in time each month.  Informed patient that we should be setting up refills 7-10 days prior to when they will run out of medication.  A pharmacist will reach out to perform a clinical assessment periodically.  Informed patient that a welcome packet, containing information about our pharmacy and other support services, a Notice of Privacy Practices, and a drug information handout will be sent.      The patient or caregiver noted above participated in the development of this care plan and knows that they can request review of or adjustments to the care plan at any time.      Patient or caregiver verbalized understanding of the above information as well as how to contact the pharmacy at [phone] option 4 with any questions/concerns.  The pharmacy is open Monday through Friday 8:30am-4:30pm.  A pharmacist is available 24/7 via pager to answer any clinical questions they may have.    Patient Specific Needs     Does the patient have any physical, cognitive, or cultural barriers? No    Does the patient have adequate living arrangements? (i.e. the ability to store and take their medication appropriately) Yes    Did you identify any home environmental safety or security hazards? No    Patient prefers to have medications discussed with  Patient     Is the patient or caregiver able to read and understand education materials at a high school level or above? Yes    Patient's primary language is  English     Is the patient high risk? No    SOCIAL DETERMINANTS OF HEALTH     At the [facility], we have learned that life circumstances - like trouble affording  food, housing, utilities, or transportation can affect the health of many of our patients.   That is why we wanted to ask: are you currently experiencing any life circumstances that are negatively impacting your health and/or quality of life? Patient declined to answer    Social Drivers of Health     Food Insecurity: Food Insecurity Present (10/03/2022)    Received from [facility], [facility]    Hunger Vital Sign     Worried About Running Out of Food in the Last Year: Often true     Ran Out of Food in the Last Year: Sometimes true   Internet Connectivity: Not on file   Housing/Utilities: Not on file   Tobacco Use: Low Risk  (06/03/2023)    Received from [facility]    Patient History     Smoking Tobacco Use: Never Smokeless Tobacco Use: Never     Passive Exposure: Not on file   Transportation Needs: No Transportation Needs (10/03/2022)    Received from [facility], [facility]    PRAPARE - Transportation     Lack of Transportation (Medical): No     Lack of Transportation (Non-Medical): No   Alcohol Use: Not on file   Interpersonal Safety: Not on file   Physical Activity: Insufficiently Active (10/03/2022)    Received from [facility], [facility]    Exercise Vital Sign     Days of Exercise per Week: 3 days     Minutes of Exercise per Session: 10 min   Intimate Partner Violence: Not At Risk (09/25/2021)    Received from [facility], [facility]    Humiliation, Afraid, Rape, and Kick questionnaire     Fear of Current or Ex-Partner: No     Emotionally Abused: No     Physically Abused: No     Sexually Abused: No   Stress: No Stress Concern Present (10/03/2022)    Received from [facility], [facility]    [facility] of Occupational Health - Occupational Stress Questionnaire     Feeling of Stress : Only a little   Substance Use: Not on file (05/06/2023)   Social Connections: Moderately Integrated (10/03/2022)    Received from [facility], [facility]    Social Connection and Isolation Panel [NHANES]     Frequency of Communication with Friends and Family: More than three times a week     Frequency of Social Gatherings with Friends and Family: Twice a week     Attends Religious Services: More than 4 times per year     Active Member of [employer] or Organizations: Yes     Attends Engineer, structural: More than 4 times per year     Marital Status: Never married   Physicist, medical Strain: High Risk (10/03/2022)    Received from [facility], [facility]    Overall Financial Resource Strain (CARDIA)     Difficulty of Paying Living Expenses: Hard   Depression: Not on file   Health Literacy: Not on file       Would you be willing to receive help with any of the needs that you have identified today? Not applicable       Erica Norman [facility] Specialty Pharmacist life circumstances that are negatively impacting your health and/or quality of life? {YES/NO/PATIENTDECLINED:93004}    Social Drivers of Health     Food Insecurity: Food Insecurity Present (10/03/2022)    Received from [facility], [facility]  Hunger Vital Sign     Worried About Running Out of Food in the Last Year: Often true     Ran Out of Food in the Last Year: Sometimes true   Internet Connectivity: Not on file   Housing/Utilities: Not on file   Tobacco Use: Low Risk  (06/03/2023)    Received from [facility]    Patient History     Smoking Tobacco Use: Never     Smokeless Tobacco Use: Never     Passive Exposure: Not on file   Transportation Needs: No Transportation Needs (10/03/2022)    Received from [facility], [facility]    PRAPARE - Transportation     Lack of Transportation (Medical): No     Lack of Transportation (Non-Medical): No   Alcohol Use: Not on file   Interpersonal Safety: Not on file   Physical Activity: Insufficiently Active (10/03/2022)    Received from [facility], [facility]    Exercise Vital Sign     Days of Exercise per Week: 3 days     Minutes of Exercise per Session: 10 min   Intimate Partner Violence: Not At Risk (09/25/2021)    Received from [facility], [facility]    Humiliation, Afraid, Rape, and Kick questionnaire     Fear of Current or Ex-Partner: No     Emotionally Abused: No     Physically Abused: No     Sexually Abused: No   Stress: No Stress Concern Present (10/03/2022)    Received from [facility], [facility]    [facility] of Occupational Health - Occupational Stress Questionnaire     Feeling of Stress : Only a little   Substance Use: Not on file (05/06/2023)   Social Connections: Moderately Integrated (10/03/2022)    Received from [facility], [facility]    Social Connection and Isolation Panel [NHANES]     Frequency of Communication with Friends and Family: More than three times a week     Frequency of Social Gatherings with Friends and Family: Twice a week     Attends Religious Services: More than 4 times per year     Active Member of [employer] or Organizations: Yes     Attends Engineer, structural: More than 4 times per year     Marital Status: Never married   Physicist, medical Strain: High Risk (10/03/2022)    Received from [facility], [facility]    Overall Financial Resource Strain (CARDIA)     Difficulty of Paying Living Expenses: Hard   Depression: Not on file   Health Literacy: Not on file       Would you be willing to receive help with any of the needs that you have identified today? {Yes/No/Not applicable:93005}       Lissete Maestas [facility] Specialty Pharmacist

## 2023-07-26 MED FILL — COSENTYX UNOREADY PEN 300 MG/2 ML (150 MG/ML) SUBCUTANEOUS: SUBCUTANEOUS | 35 days supply | Qty: 10 | Fill #0

## 2023-08-29 DIAGNOSIS — G809 Cerebral palsy, unspecified: Secondary | ICD-10-CM | POA: Diagnosis not present

## 2023-08-29 DIAGNOSIS — Z79899 Other long term (current) drug therapy: Secondary | ICD-10-CM | POA: Diagnosis not present

## 2023-08-29 DIAGNOSIS — G43909 Migraine, unspecified, not intractable, without status migrainosus: Secondary | ICD-10-CM | POA: Diagnosis not present

## 2023-08-29 DIAGNOSIS — M62838 Other muscle spasm: Secondary | ICD-10-CM | POA: Diagnosis not present

## 2023-08-29 DIAGNOSIS — R519 Headache, unspecified: Secondary | ICD-10-CM | POA: Diagnosis not present

## 2023-08-29 DIAGNOSIS — G40909 Epilepsy, unspecified, not intractable, without status epilepticus: Secondary | ICD-10-CM | POA: Diagnosis not present

## 2023-08-29 DIAGNOSIS — M26609 Unspecified temporomandibular joint disorder, unspecified side: Secondary | ICD-10-CM | POA: Diagnosis not present

## 2023-09-11 NOTE — Unmapped (Signed)
 [facility] Clinical Assessment & Refill Coordination Note    Patient reports doing well on the medication and did not have any questions or concerns at this time.   Patient started taking Tizanidine, reviewed no interactions with biologic    Erica Norman, DOB: 28-Jun-1981  Phone: [phone] (home)     All above HIPAA information was verified with patient.     Was a Nurse, learning disability used for this call? No    Specialty Medication(s):   Inflammatory Disorders: Cosentyx     Current Outpatient Medications   Medication Sig Dispense Refill    ascorbic acid, vitamin C, (VITAMIN C) 1000 MG tablet Take 1 tablet (1,000 mg total) by mouth.      brivaracetam (BRIVIACT) 50 mg tablet Take 1 tablet (50 mg total) by mouth.      empty container Misc Use as directed to dispose of Humira pens. 1 each 2    FELBATOL 600 mg tablet       HYDROcodone-acetaminophen (NORCO) 5-325 mg per tablet Take 1-2 tablets by mouth every six (6) hours as needed for pain. 15 tablet 0    linezolid (ZYVOX) 600 mg tablet Take 1 tablet (600 mg total) by mouth every twelve (12) hours. For 2 weeks. 28 tablet 0    magnesium oxide (MAG-OX) 400 mg (241.3 mg magnesium) tablet Take 1 tablet (400 mg total) by mouth daily.      melatonin 5 mg tablet Take 1.5 tablets (7.5 mg total) by mouth.      metFORMIN (GLUCOPHAGE) 500 MG tablet Take 1 tablet (500 mg total) by mouth in the morning and 1 tablet (500 mg total) in the evening. Take with meals. 180 tablet 3    pantoprazole (PROTONIX) 40 MG tablet 20 mg.      secukinumab 300 mg/2 mL (150 mg/mL) PnIj Loading doses - inject the contents of 1 pen (300mg ) under the skin weekly on week 0, 1, 2, 3, and 4 (5 weeks total) 10 mL 0    secukinumab 300 mg/2 mL PnIj Inject the contents of 1 pen (300 mg) under the skin every twenty-eight (28) days. Maintenance dosing. 2 mL 11    sodium chloride (AYR) 0.65 % Drop 1 spray into each nostril.      spironolactone (ALDACTONE) 100 MG tablet Take 2 tablets (200 mg total) by mouth daily. If you develop lightheadedness, decrease to 1.5 tablets daily 180 tablet 3    tizanidine HCl (TIZANIDINE ORAL) Take by mouth.      topiramate (TOPAMAX) 25 MG tablet Take 2 tablets (50 mg total) by mouth.      turmeric, bulk, 95 % Powd Take 2 capsules by mouth.       No current facility-administered medications for this visit.        Changes to medications: Jamelle reports no changes at this time.    Medication list has been reviewed and updated in Epic: Yes    Allergies   Allergen Reactions    Codeine Anaphylaxis    Penicillins Anaphylaxis and Other (See Comments)     Has patient had a PCN reaction causing immediate rash, facial/tongue/throat swelling, SOB or lightheadedness with hypotension: Yes  Has patient had a PCN reaction causing severe rash involving mucus membranes or skin necrosis: No  Has patient had a PCN reaction that required hospitalization: Yes  Has patient had a PCN reaction occurring within the last 10 years: No  If all of the above answers are NO, then may proceed  with Cephalosporin use.  Has patient had a PCN reaction causing immediate rash, facial/tongue/throat swelling, SOB or lightheadedness with hypotension: Yes  Has patient had a PCN reaction causing severe rash involving mucus membranes or skin necrosis: No  Has patient had a PCN reaction that required hospitalization: Yes  Has patient had a PCN reaction occurring within the last 10 years: No  If all of the above answers are NO, then may proceed with Cephalosporin use.      Sulfa (Sulfonamide Antibiotics) Hives    Adhesive Tape-Silicones Other (See Comments)     Large Boils-PAPER TAPE OK TO USE       Changes to allergies: No    Allergies have been reviewed and updated in Epic: Yes    SPECIALTY MEDICATION ADHERENCE     Cosentyx 300 mg/ml: 0 doses of medicine on hand       Medication Adherence    Patient reported X missed doses in the last month: 0  Specialty Medication: secukinumab 300 mg/2 mL (150 mg/mL) PnIj  Patient is on additional specialty medications: No  Patient is on more than two specialty medications: No  Informant: patient          Specialty medication(s) dose(s) confirmed: Regimen is correct and unchanged.     Are there any concerns with adherence? No    Adherence counseling provided? Not needed    CLINICAL MANAGEMENT AND INTERVENTION      Clinical Benefit Assessment:    Do you feel the medicine is effective or helping your condition? Yes    Clinical Benefit counseling provided? Not needed    Adverse Effects Assessment:    Are you experiencing any side effects? No    Are you experiencing difficulty administering your medicine? No    Quality of Life Assessment:    Quality of Life    Rheumatology  Oncology  Dermatology  1. What impact has your specialty medication had on the symptoms of your skin condition (i.e. itchiness, soreness, stinging)?: Tremendous  2. What impact has your specialty medication had on your comfort level with your skin?: Tremendous  Cystic Fibrosis          How many days over the past month did your HS  keep you from your normal activities? For example, brushing your teeth or getting up in the morning. 0    Have you discussed this with your provider? Not needed    Acute Infection Status:    Acute infections noted within Epic:  No active infections    Patient reported infection: None    Therapy Appropriateness:    Is therapy appropriate based on current medication list, adverse reactions, adherence, clinical benefit and progress toward achieving therapeutic goals? Yes, therapy is appropriate and should be continued     Clinical Intervention:    Was an intervention completed as part of this clinical assessment? No    DISEASE/MEDICATION-SPECIFIC INFORMATION      For patients on injectable medications: Patient currently has 0 doses left.  Next injection is scheduled for 09/20/23.    Chronic Inflammatory Diseases: Have you experienced any flares in the last month? No    PATIENT SPECIFIC NEEDS     Does the patient have any physical, cognitive, or cultural barriers? No    Is the patient high risk? No    Does the patient require physician intervention or other additional services (i.e., nutrition, smoking cessation, social work)? No    Does the patient have an additional or emergency contact listed  in their chart? Yes    SOCIAL DETERMINANTS OF HEALTH     At the [facility], we have learned that life circumstances - like trouble affording food, housing, utilities, or transportation can affect the health of many of our patients.   That is why we wanted to ask: are you currently experiencing any life circumstances that are negatively impacting your health and/or quality of life? Patient declined to answer    Social Drivers of Health     Food Insecurity: Food Insecurity Present (10/03/2022)    Received from [facility], [facility]    Hunger Vital Sign     Worried About Running Out of Food in the Last Year: Often true     Ran Out of Food in the Last Year: Sometimes true   Tobacco Use: Low Risk  (06/03/2023)    Received from [facility]    Patient History     Smoking Tobacco Use: Never     Smokeless Tobacco Use: Never     Passive Exposure: Not on file   Transportation Needs: No Transportation Needs (10/03/2022)    Received from [facility], [facility]    PRAPARE - Transportation     Lack of Transportation (Medical): No     Lack of Transportation (Non-Medical): No   Alcohol Use: Not on file   Housing: Not on file   Physical Activity: Insufficiently Active (10/03/2022)    Received from [facility], [facility]    Exercise Vital Sign     Days of Exercise per Week: 3 days     Minutes of Exercise per Session: 10 min   Utilities: Not At Risk (10/31/2022)    Received from [facility], [facility]    [facility] Utilities     Threatened with loss of utilities: No   Stress: No Stress Concern Present (10/03/2022)    Received from [facility]    [facility] - Occupational Stress Questionnaire     Feeling of Stress : Only a little   Interpersonal Safety: Not on file   Substance Use: Not on file (05/06/2023)   Intimate Partner Violence: Not At Risk (09/25/2021)    Received from [facility], [facility]    Humiliation, Afraid, Rape, and Kick questionnaire     Fear of Current or Ex-Partner: No     Emotionally Abused: No     Physically Abused: No     Sexually Abused: No   Social Connections: Moderately Integrated (10/03/2022)    Received from [facility], [facility]    Social Connection and Isolation Panel [NHANES]     Frequency of Communication with Friends and Family: More than three times a week     Frequency of Social Gatherings with Friends and Family: Twice a week     Attends Religious Services: More than 4 times per year     Active Member of [employer] or Organizations: Yes     Attends Engineer, structural: More than 4 times per year     Marital Status: Never married   Physicist, medical Strain: High Risk (10/03/2022)    Received from [facility], [facility]    Overall Financial Resource Strain (CARDIA)     Difficulty of Paying Living Expenses: Hard   Depression: Not on file   Internet Connectivity: Not on file   Health Literacy: Not on file       Would you be willing to receive help with any of the needs  that you have identified today? Not applicable       SHIPPING     Specialty Medication(s) to be Shipped:   Inflammatory Disorders: Cosentyx    Other medication(s) to be shipped: No additional medications requested for fill at this time     Changes to insurance: No    Cost and Payment: Patient has a copay of $4. They are aware and have authorized the pharmacy to charge the credit card on file.    Delivery Scheduled: Yes, Expected medication delivery date: 09/16/23.     Medication will be delivered via Same Day Courier to the confirmed prescription address in [facility].    The patient will receive a drug information handout for each medication shipped and additional FDA Medication Guides as required.  Verified that patient has previously received a Conservation officer, historic buildings and a Surveyor, mining.    The patient or caregiver noted above participated in the development of this care plan and knows that they can request review of or adjustments to the care plan at any time.      All of the patient's questions and concerns have been addressed.    Sherral Hammers, PharmD   [facility] Specialty Pharmacist

## 2023-09-16 MED FILL — COSENTYX UNOREADY PEN 300 MG/2 ML SUBCUTANEOUS: SUBCUTANEOUS | 28 days supply | Qty: 2 | Fill #0

## 2023-10-04 NOTE — Unmapped (Signed)
 [facility] Refill Coordination Note    Erica Norman, DOB: [birth year]  Phone: [phone] (home)       All above HIPAA information was verified with patient.         10/03/2023     5:42 PM   Specialty Rx Medication Refill Questionnaire   Which Medications would you like refilled and shipped? Cosentyx i have no medication on hand as it is a one every 28 day   Please list all current allergies: Pcn codine and sulfa along with adhesive   Have you missed any doses in the last 30 days? No   Have you had any changes to your medication(s) since your last refill? No   How many days remaining of each medication do you have at home? 0   If receiving an injectable medication, next injection date is 10/18/2023   Have you experienced any side effects in the last 30 days? No   Please enter the full address (street address, city, state, zip code) where you would like your medication(s) to be delivered to. [phone] [place] [address]   Please specify on which day you would like your medication(s) to arrive. Note: if you need your medication(s) within 3 days, please call the pharmacy to schedule your order at [phone]  10/14/2023   Has your insurance changed since your last refill? No   Would you like a pharmacist to call you to discuss your medication(s)? No   Do you require a signature for your package? (Note: if we are billing Medicare Part B or your order contains a controlled substance, we will require a signature) No   I have been provided my out of pocket cost for my medication and approve the pharmacy to charge the amount to my credit card on file. Yes         Completed refill call assessment today to schedule patient's medication shipment from the [facility] [phone]).  All relevant notes have been reviewed.       Confirmed patient received a Conservation officer, historic buildings and a Surveyor, mining with first shipment. The patient will receive a drug information handout for each medication shipped and additional FDA Medication Guides as required.         REFERRAL TO PHARMACIST     Referral to the pharmacist: Not needed      [facility]     Shipping address confirmed in Epic.     Delivery Scheduled: Yes, Expected medication delivery date: 10/14/23.     Medication will be delivered via Same Day Courier to the prescription address in Epic [facility].    Tobi Bastos, PharmD   [facility] Specialty Pharmacist

## 2023-10-14 MED FILL — COSENTYX UNOREADY PEN 300 MG/2 ML SUBCUTANEOUS: SUBCUTANEOUS | 28 days supply | Qty: 2 | Fill #1

## 2023-10-29 ENCOUNTER — Other Ambulatory Visit: Payer: Self-pay | Admitting: *Deleted

## 2023-10-29 DIAGNOSIS — N200 Calculus of kidney: Secondary | ICD-10-CM

## 2023-10-30 ENCOUNTER — Ambulatory Visit: Payer: Self-pay | Admitting: Urology

## 2023-10-30 ENCOUNTER — Telehealth: Admitting: Nurse Practitioner

## 2023-10-30 DIAGNOSIS — J324 Chronic pansinusitis: Secondary | ICD-10-CM | POA: Diagnosis not present

## 2023-10-30 MED ORDER — AZITHROMYCIN 250 MG PO TABS: ORAL_TABLET | ORAL | 0 refills | Status: AC

## 2023-10-30 NOTE — Progress Notes (Signed)
 Virtual Visit Consent   Destiny Flores, you are scheduled for a virtual visit with a [facility] provider today. Just as with appointments in the office, your consent must be obtained to participate. Your consent will be active for this visit and any virtual visit you may have with one of our providers in the next 365 days. If you have a MyChart account, a copy of this consent can be sent to you electronically.  As this is a virtual visit, video technology does not allow for your provider to perform a traditional examination. This may limit your provider's ability to fully assess your condition. If your provider identifies any concerns that need to be evaluated in person or the need to arrange testing (such as labs, EKG, etc.), we will make arrangements to do so. Although advances in technology are sophisticated, we cannot ensure that it will always work on either your end or our end. If the connection with a video visit is poor, the visit may have to be switched to a telephone visit. With either a video or telephone visit, we are not always able to ensure that we have a secure connection.  By engaging in this virtual visit, you consent to the provision of healthcare and authorize for your insurance to be billed (if applicable) for the services provided during this visit. Depending on your insurance coverage, you may receive a charge related to this service.  I need to obtain your verbal consent now. Are you willing to proceed with your visit today? Destiny Flores has provided verbal consent on 10/30/2023 for a virtual visit (video or telephone). Mardene Shake, FNP  Date: 10/30/2023 6:58 PM   Virtual Visit via Video Note   I, Mardene Shake, connected with  Destiny Flores  ([id number], 07-29-1980) on 10/30/23 at  7:00 PM EDT by a video-enabled telemedicine application and verified that I am speaking with the correct person using two identifiers.  Location: Patient: Virtual Visit Location Patient:  Home Provider: Virtual Visit Location Provider: Home Office   I discussed the limitations of evaluation and management by telemedicine and the availability of in person appointments. The patient expressed understanding and agreed to proceed.    History of Present Illness: Destiny Flores is a 43 y.o. 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   Thrombocytosis 05/17/2020   Hyperlipidemia 05/17/2020   Gastroesophageal reflux disease without esophagitis 05/17/2020   Chronic sinusitis 05/17/2020   Vitamin D  deficiency 03/06/2018   Cerebral palsy (HCC) 12/24/2017   Epilepsy (HCC) 12/24/2017   Hidradenitis suppurativa 12/24/2017   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  Has patient had a PCN reaction causing immediate  rash, facial/tongue/throat swelling, SOB or lightheadedness with hypotension: Yes Has patient had a PCN reaction causing severe rash involving mucus membranes or skin necrosis: No Has patient had a PCN reaction that required hospitalization: Yes Has patient had a PCN reaction occurring within the last 10 years: No If all of the above answers are "NO", then may proceed with Cephalosporin use. Has patient had a PCN reaction causing immediate rash, facial/tongue/throat swelling, SOB or lightheadedness with hypotension: Yes Has patient had a PCN reaction causing severe rash involving mucus membranes or skin necrosis: No Has patient had a PCN reaction that required hospitalization: Yes Has patient had a PCN reaction occurring within the last 10 years: No If all of the above answers are "NO", then may proceed with Cephalosporin use.   Sulfa Antibiotics Hives   Tape Other (See Comments)    Large Boils-PAPER TAPE OK TO USE Large Boils-PAPER TAPE OK TO USE   Medications:  Current Outpatient Medications:    ascorbic acid (VITAMIN C) 1000 MG tablet, Take by mouth., Disp: , Rfl:    benzonatate  (TESSALON ) 100 MG capsule, Take 1-2 capsules (100-200 mg total) by mouth 3 (three) times daily as needed., Disp: 30 capsule, Rfl: 0   Brivaracetam (BRIVIACT) 100 MG TABS, Take 1 tablet by mouth 2 (two) times daily., Disp: , Rfl:    clindamycin  (CLEOCIN ) 300 MG capsule, Take 1 capsule (300 mg total) by mouth 2 (two) times daily. Take for 7 d for HS flares, Disp: 60 capsule, Rfl: 0   felbamate  (FELBATOL ) 600 MG tablet, Take 600 mg by mouth 3 (three) times daily., Disp: , Rfl:    HUMIRA, 2 PEN, 80 MG/0.8ML PNKT, Inject into the skin., Disp: , Rfl:    magnesium oxide (MAG-OX) 400 MG tablet, Take by mouth., Disp: , Rfl:    Melatonin 3 MG CAPS, Take by mouth., Disp: , Rfl:    metFORMIN (GLUCOPHAGE) 500 MG tablet, Take by mouth., Disp: , Rfl:    ondansetron  (ZOFRAN -ODT) 4 MG disintegrating tablet, Take 1 tablet (4 mg  total) by mouth every 8 (eight) hours as needed., Disp: 20 tablet, Rfl: 0   pantoprazole  (PROTONIX ) 40 MG tablet, Take 1 tablet (40 mg total) by mouth 2 (two) times daily., Disp: 180 tablet, Rfl: 0   pyridOXINE (VITAMIN B-6) 100 MG tablet, Take 100 mg by mouth daily., Disp: , Rfl:    spironolactone (ALDACTONE) 100 MG tablet, Take by mouth., Disp: , Rfl:    SUMAtriptan  (IMITREX ) 50 MG tablet, Take 1 tablet (50 mg total) by mouth daily as needed., Disp: 10 tablet, Rfl: 1   topiramate (TOPAMAX) 25 MG tablet, Take 50 mg by mouth at bedtime., Disp: , Rfl:    TURMERIC CURCUMIN PO, Take 2 capsules by mouth daily., Disp: , Rfl:   Observations/Objective: Patient is well-developed, well-nourished in no acute distress.  Resting comfortably  at home.  Head is normocephalic, atraumatic.  No labored breathing.  Speech is clear and coherent with logical content.  Patient is alert and oriented at baseline.    Assessment and Plan:   1. Chronic pansinusitis Follow up with ENT   Meds  ordered this encounter  Medications   azithromycin  (ZITHROMAX ) 250 MG tablet    Sig: Take 2 tablets on day 1, then 1 tablet daily on days 2 through 5    Dispense:  6 tablet    Refill:  0     Follow Up Instructions: I discussed the assessment and treatment plan with the patient. The patient was provided an opportunity to ask questions and all were answered. The patient agreed with the plan and demonstrated an understanding of the instructions.  A copy of instructions were sent to the patient via MyChart unless otherwise noted below.    The patient was advised to call back or seek an in-person evaluation if the symptoms worsen or if the condition fails to improve as anticipated.    Mardene Shake, FNP

## 2023-11-01 ENCOUNTER — Other Ambulatory Visit: Payer: Self-pay | Admitting: Physician Assistant

## 2023-11-01 DIAGNOSIS — K219 Gastro-esophageal reflux disease without esophagitis: Secondary | ICD-10-CM

## 2023-11-03 NOTE — Unmapped (Signed)
 Dermatology Note    Assessment and Plan:      Hidradenitis Suppurativa (HS): Hurley Stage 2, chronic, continues to flare around menses, not at patient goal  - S/p unroofing procedure in the L perianal area, R perianal area, and L labia majora (partial labial removal) on 12/04/2022  - previous treatments: Humira  - We discussed the typical natural history, pathogenesis, treatment options, and expected course as well as the relapsing and sometimes recalcitrant nature of the disease.    - Patient previously elected to avoid infliximab due to side effects  - Patient is unable to take rifampin because it interacts with her seizure medications (briviact and felbatol)  - Increase secukinumab 300 mg/2 mL (150 mg/mL) PnIj; Inject 300 mg under the skin every 2 weeks (14) days. Maintenance dosing.    - Continue metformin 500 mg BID. Side effects discussed. Refilled today  - Continue spironolactone (ALDACTONE) 100 MG tablet; Take 2 tablets (200 mg total) by mouth daily.   - Start hydrocodone; every 6 hours as needed for pain    High risk medication use (Cosentyx)  - Negative Quant gold obtained 07/03/2022  - Adalimumab/Adalimumab Ab slightly low 07/03/2022 so was unable to increase Humira dosage      The patient was advised to call for an appointment should any new, changing, or symptomatic lesions develop.     RTC: Return in about 4 months (around 03/06/2024) for follow up of HS. or sooner as needed   _________________________________________________________________      Chief Complaint     Chief Complaint   Patient presents with    HS     New flare ups located groin, heavy drainage.        HPI     Erica Norman is a 43 y.o. female who presents as a returning patient (last seen by Dr. Jann Melody on 06/10/2023) to Dermatology for follow up of hidradenitis suppurativa. At last visit, patient was to start secukinumab 150 mg/mL once weekly after 300mg  weekly on 0, 1, 2, 3, and 4 for HS. Patient additionally was to continue metformin 500mg  twice daily, sprinolactone 100mg  tablet twice daily, and start linezolid 600 mg every 12 hours for 14 days for same. Patient also underwent intralesional kenalog procedure to the left and right inguinal for same.     Today,  Patient reports tenderness and pain in the flares. She reports she has been on Cosentyx for 3 months, which was approved by her insurance. She is taking them every 4 weeks. Patient reports tenderness and pain in the flares.     Self-reported severity (0-5): 3  VAS pain today: 4  VAS average pain for the last month: 7  How often in pain?  less than montly  Level of odor (0-5): 3  Level of itching (0-5): 4  Dressing changes needed for drainage:No drainage/less than once weekly  How much drainage: some drainage  Flare in the last month (Y/N)? No.  How long ago was the last flare? in the last year  Developing new lesions? less than monthly  Number of inflammatory lesions montly: 1-3  DLQI: 16  Current treatment: Cosentyx   How helpful is the current treatment in managing the following aspects of your disease?  Not at all helpful Somewhat helpful Very helpful   Pain         Decreasing length of flares     x   Decreasing new lesions     x   Drainage  Decreasing frequency of flares     x   Decreasing severity of flares     x   Odor           The patient denies any other new or changing lesions or areas of concern.     Pertinent Past Medical History     Disease course:  Year when symptoms first noticed: Age 24   Year of diagnosis: Age 73  Who diagnosed you? Dermatologist  Location of first symptoms: axillae  Typical involved areas include: groin  Typical number of inflammatory lesions each month at baseline (from first visit): three to five  Disease triggers: stress, sweat and menstrual cycle     Are menstrual cycles irregular when not on birth control? No.  Current form of contraception: None  Effect of hormonal contraception on disease: n/a  Flaring with menstrual cycle (before, during, or after?): during  Difficulty becoming pregnant? n/a  Pregnancy complications? never pregnant    How many children? 0  Better or worse with pregnancy?  never pregnant.  If so, worse during a specific trimester? never pregnant       Prior treatments:  Topical: Clobetasol  Systemic: Keflex, Doxycyline, Humira titrated up to 80mg  weekly, but stopped in summer 2024 due to insurance issues, Clindamycin  Past surgical procedures: Yes -bilateral wide excisions of the axilla healed well since 2008 or 2009.  She had more recent excisions in the bilateral inguinal folds in 2016-2018.  These have mostly healed well but she has some persistent areas in particular along the infra abdominal fold and one in the right inguinal fold  Past laser procedures: No     Past Medical History, Family History, Social History, Medication List, Allergies, and Problem List were reviewed in the rooming section of Epic.     ROS: Other than symptoms mentioned in the HPI, no fevers, chills, or other skin complaints    Physical Examination     Gen: Well-appearing patient, appropriate, interactive, in no acute distress  SKIN (Focal Skin Exam): Per patient request, examination of bilateral axillae, chest, abdomen, bilateral thighs, groin, buttocks, and external genitalia was performed    location Abscess Inflamed nodule Non-inflamed nodule Draining sinus Non-draining Sinus Hurley % scar   R axilla          L axilla          R inframammary          L inframammary          Intermammary          Pubic          R inguinal          R thigh          L inguinal  2   1     L thigh          Scrotum/Vulva          Perianal          R buttock          L buttock  1        Other (list)                      AN count (total sum of abscess and inflammatory nodule): 3  Pilonidal sinus? No    -sites not commented on demonstrate normal findings.    Scribe's Attestation: Florette Hurry, MD obtained and performed the history, physical exam and medical  decision making elements that were entered into the chart.  Signed by Yaakov Heir, Scribe, on Nov 04, 2023 at 2:35 PM.    ----------------------------------------------------------------------------------------------------------------------  Nov 04, 2023 7:25 PM. Documentation assistance provided by the Scribe. I was present during the time the encounter was recorded. The information recorded by the Scribe was done at my direction and has been reviewed and validated by me.  ----------------------------------------------------------------------------------------------------------------------       (Approved Template 03/14/2020)

## 2023-11-04 ENCOUNTER — Ambulatory Visit: Admit: 2023-11-04 | Discharge: 2023-11-05 | Payer: Medicaid (Managed Care)

## 2023-11-04 DIAGNOSIS — Z79899 Other long term (current) drug therapy: Principal | ICD-10-CM

## 2023-11-04 DIAGNOSIS — L732 Hidradenitis suppurativa: Principal | ICD-10-CM

## 2023-11-04 MED ORDER — HYDROCODONE 5 MG-ACETAMINOPHEN 325 MG TABLET
ORAL_TABLET | Freq: Four times a day (QID) | ORAL | 0 refills | 2.00000 days | Status: CP | PRN
Start: 2023-11-04 — End: ?

## 2023-11-04 MED ORDER — SECUKINUMAB 300 MG/2 ML SUBCUTANEOUS PEN INJECTOR
SUBCUTANEOUS | 11 refills | 0.00000 days | Status: CP
Start: 2023-11-04 — End: ?
  Filled 2023-11-08: qty 4, 28d supply, fill #0

## 2023-11-04 MED ORDER — METFORMIN 500 MG TABLET
ORAL_TABLET | Freq: Two times a day (BID) | ORAL | 3 refills | 90.00000 days | Status: CP
Start: 2023-11-04 — End: 2024-11-03

## 2023-11-04 NOTE — Telephone Encounter (Signed)
 Requested Prescriptions  Pending Prescriptions Disp Refills   pantoprazole  (PROTONIX ) 40 MG tablet [Pharmacy Med Name: PANTOPRAZOLE  40MG  TABLETS] 180 tablet 0    Sig: TAKE 1 TABLET(40 MG) BY MOUTH TWICE DAILY     Gastroenterology: Proton Pump Inhibitors Failed - 11/04/2023  3:15 PM      Failed - Valid encounter within last 12 months    Recent Outpatient Visits   None     Future Appointments             In 3 weeks Stoioff, Kizzie Perks, MD [facility] [place]   In 1 month Adeline Hone, PA-C [facility], [facility]

## 2023-11-04 NOTE — Unmapped (Deleted)
 Self-reported severity (0-5): 3  VAS pain today: 4  VAS average pain for the last month: 7  Requiring pain medication? {YES/NO:21013}  If so, what type/frequency? ***  How often in pain?  less than montly  Level of odor (0-5): 3  Level of itching (0-5): 4  Dressing changes needed for drainage:No drainage/less than once weekly  How much drainage: some drainage  Flare in the last month (Y/N)? No.  How long ago was the last flare? in the last year  Developing new lesions? less than monthly  Number of inflammatory lesions montly: 1-3  DLQI: 16  Current treatment: ***     How helpful is the current treatment in managing the following aspects of your disease?  Not at all helpful Somewhat helpful Very helpful   Pain      Decreasing length of flares   x   Decreasing new lesions   x   Drainage      Decreasing frequency of flares   x   Decreasing severity of flares   x   Odor

## 2023-11-04 NOTE — Unmapped (Addendum)
 Meet your team:     Your intake nurse is: Marliss Simple     Please remember to fill out the survey you will receive after your visit. Your comments help us  continue to improve our care.      Thanks in advance!      [facility] Clinical Staff     For your HS:  - Increase Cosentyx injections to every two weeks.

## 2023-11-06 NOTE — Unmapped (Signed)
 Clinical Assessment Needed For: Dose Change  Medication: COSENTYX UNOREADY PEN 300 mg/2 mL Pnij (secukinumab)  Last Fill Date/Day Supply: 10/14/23 / 28  Copay $4  Was previous dose already scheduled to fill: No    Notes to Pharmacist: dose increase

## 2023-11-06 NOTE — Unmapped (Signed)
 [facility] Refill Coordination Note    Specialty Medication(s) to be Shipped:   Inflammatory Disorders: Cosentyx    Other medication(s) to be shipped: No additional medications requested for fill at this time     Erica Norman, DOB: February 06, 1981  Phone: [phone] (home)       All above HIPAA information was verified with patient.     Was a Nurse, learning disability used for this call? No    Completed refill call assessment today to schedule patient's medication shipment from the [facility]  [phone]).  All relevant notes have been reviewed.     Specialty medication(s) and dose(s) confirmed: Regimen is correct and unchanged.   Changes to medications: Erica Norman reports no changes at this time.  Changes to insurance: No  New side effects reported not previously addressed with a pharmacist or physician: None reported  Questions for the pharmacist: No    Confirmed patient received a Conservation officer, historic buildings and a Surveyor, mining with first shipment. The patient will receive a drug information handout for each medication shipped and additional FDA Medication Guides as required.       DISEASE/MEDICATION-SPECIFIC INFORMATION        For patients on injectable medications: Patient currently has 0 doses left.  Next injection is scheduled for 11/08/2023.    SPECIALTY MEDICATION ADHERENCE              Were doses missed due to medication being on hold? No    Cosentyx Unoready Pen 300mg /51mL : 0 doses of medicine on hand     REFERRAL TO PHARMACIST     Referral to the pharmacist: Not needed      [facility]     Shipping address confirmed in Epic.     Cost and Payment: Patient has a copay of $4. They are aware and have authorized the pharmacy to charge the credit card on file.    Delivery Scheduled: Yes, Expected medication delivery date: 11/08/2023.     Medication will be delivered via Same Day Courier to the prescription address in Epic WAM.    Court [place], PharmD   [facility]  Specialty Pharmacist

## 2023-11-06 NOTE — Unmapped (Signed)
 [facility] Pharmacist has reviewed a new prescription for Cosentyx Unoready that indicates a dose increase.  Patient was counseled on this dosage change by Dr. Jann Melody- see epic note from 11/04/2023.    [place], [place]. DAaron Aas  Clinical Pharmacist - [facility]  [address] [place], [place] [address]  Phone: [phone] - Fax. [phone]

## 2023-11-20 MED ORDER — TRANEXAMIC ACID 650 MG TABLET
ORAL_TABLET | ORAL | 0 refills | 0.00000 days | Status: CP
Start: 2023-11-20 — End: ?

## 2023-11-28 ENCOUNTER — Encounter: Payer: Self-pay | Admitting: Urology

## 2023-11-28 ENCOUNTER — Ambulatory Visit
Admission: RE | Admit: 2023-11-28 | Discharge: 2023-11-28 | Disposition: A | Discharge status: Home / Self Care | Source: Ambulatory Visit | Attending: Urology | Admitting: Urology

## 2023-11-28 ENCOUNTER — Ambulatory Visit
Admission: RE | Admit: 2023-11-28 | Discharge: 2023-11-28 | Disposition: A | Discharge status: Home / Self Care | Attending: Urology | Admitting: Urology

## 2023-11-28 ENCOUNTER — Ambulatory Visit (INDEPENDENT_AMBULATORY_CARE_PROVIDER_SITE_OTHER): Admitting: Urology

## 2023-11-28 VITALS — BP 112/76 | HR 96 | Ht 64.0 in | Wt 215.0 lb

## 2023-11-28 DIAGNOSIS — N200 Calculus of kidney: Secondary | ICD-10-CM

## 2023-11-28 NOTE — Progress Notes (Signed)
 I, Maysun Jamey Mccallum, acting as a Neurosurgeon for Geraline Knapp, MD., have documented all relevant documentation on the behalf of Geraline Knapp, MD, as directed by Geraline Knapp, MD while in the presence of Geraline Knapp, MD.  I have reviewed the above documentation for accuracy and completeness, and I agree with the above.   Geraline Knapp, MD  11/28/2023 3:42 PM   Woodfin Hays 1981/06/13 [id number]  Referring provider: Adeline Hone, PA-C [address] [place],  [place] [address]  Chief Complaint  Patient presents with   Nephrolithiasis   Urologic History 1. Recurrent nephrolithiasis -Mild hypercalciuria  -Mild hypocitraturia  HPI: Madicyn Mesina is a 43 y.o. female presents for annual follow-up.  No problems since last visit.  She had a recent episode of mild flank pain and thinks she passed a small stone that she noted in the toilet.  Her CT renal stone study showed a small, non-obstructing left renal calculus in 2024.  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   Family history of breast cancer    5/23 cancer genetic testing letter sent   GERD (gastroesophageal reflux disease)    Headache    Hidradenitis suppurativa 12/24/2017   History of kidney stones    H/O   History of methicillin resistant staphylococcus aureus (MRSA) 2008   positive from axilla, left   Hx of Kawasaki's disease    Hydradenitis    Seizures (HCC)    LAST SEIZURE 2012   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    AXILLARY HIDRADENITIS EXCISION     MULTIPLE HYDRADENITIS SURGERIES IN [place]    hidradenitis groin Left 04/2017   last surgery done on groin and the symptoms have returned   HYDRADENITIS EXCISION N/A 01/16/2018   Procedure: EXCISION HIDRADENITIS GROIN;  Surgeon: Alben Alma, MD;  Location: [facility] ORS;  Service: General;  Laterality: N/A;   KNEE SURGERY Left 2010   tendon and meniscus repair   NOSE SURGERY  1998   deviated septum and sinus repair   ROBOTIC ASSISTED LAPAROSCOPIC CHOLECYSTECTOMY N/A 07/24/2018   Procedure: ROBOTIC ASSISTED LAPAROSCOPIC CHOLECYSTECTOMY;  Surgeon: Alben Alma, MD;  Location: [facility] ORS;  Service: General;  Laterality: N/A;   TONSILLECTOMY     TYMPANOSTOMY TUBE PLACEMENT     as a kid   VAGUS NERVE STIMULATOR GENERATOR CHANGE  03/2015    Home Medications:  Allergies as of 11/28/2023       Reactions   Codeine Anaphylaxis   Penicillins Anaphylaxis, Other (See Comments)   Has patient had a PCN reaction causing immediate rash, facial/tongue/throat swelling, SOB or lightheadedness with hypotension: Yes Has patient had a PCN reaction causing severe rash involving mucus membranes or skin necrosis: No Has patient had a PCN reaction that required hospitalization: Yes Has patient had a PCN reaction occurring within the last 10 years: No If all of the  above answers are "NO", then may proceed with Cephalosporin use. Has patient had a PCN reaction causing immediate rash, facial/tongue/throat swelling, SOB or lightheadedness with hypotension: Yes Has patient had a PCN reaction causing severe rash involving mucus membranes or skin necrosis: No Has patient had a PCN reaction that required hospitalization: Yes Has patient had a PCN reaction occurring within the last 10 years: No If all of the above answers are "NO", then may proceed with  Cephalosporin use. Has patient had a PCN reaction causing immediate rash, facial/tongue/throat swelling, SOB or lightheadedness with hypotension: Yes Has patient had a PCN reaction causing severe rash involving mucus membranes or skin necrosis: No Has patient had a PCN reaction that required hospitalization: Yes Has patient had a PCN reaction occurring within the last 10 years: No If all of the above answers are "NO", then may proceed with Cephalosporin use.   Sulfa Antibiotics Hives   Tape Other (See Comments)   Large Boils-PAPER TAPE OK TO USE Large Boils-PAPER TAPE OK TO USE        Medication List        Accurate as of Nov 28, 2023  3:42 PM. If you have any questions, ask your nurse or doctor.          STOP taking these medications    benzonatate  100 MG capsule Commonly known as: TESSALON  Stopped by: Geraline Knapp       TAKE these medications    ascorbic acid 1000 MG tablet Commonly known as: VITAMIN C Take by mouth.   Briviact 100 MG Tabs tablet Generic drug: brivaracetam Take 1 tablet by mouth 2 (two) times daily.   clindamycin  300 MG capsule Commonly known as: CLEOCIN  Take 1 capsule (300 mg total) by mouth 2 (two) times daily. Take for 7 d for HS flares   felbamate  600 MG tablet Commonly known as: FELBATOL  Take 600 mg by mouth 3 (three) times daily.   Humira (2 Pen) 80 MG/0.8ML Pnkt Generic drug: Adalimumab Inject into the skin.   magnesium oxide 400 MG tablet Commonly known as: MAG-OX Take by mouth.   Melatonin 3 MG Caps Take by mouth.   metFORMIN 500 MG tablet Commonly known as: GLUCOPHAGE Take by mouth.   ondansetron  4 MG disintegrating tablet Commonly known as: ZOFRAN -ODT Take 1 tablet (4 mg total) by mouth every 8 (eight) hours as needed.   pantoprazole  40 MG tablet Commonly known as: PROTONIX  TAKE 1 TABLET(40 MG) BY MOUTH TWICE DAILY   pyridOXINE 100 MG tablet Commonly known as: VITAMIN B6 Take 100 mg by mouth daily.    spironolactone 100 MG tablet Commonly known as: ALDACTONE Take by mouth.   SUMAtriptan  50 MG tablet Commonly known as: IMITREX  Take 1 tablet (50 mg total) by mouth daily as needed.   topiramate 25 MG tablet Commonly known as: TOPAMAX Take 50 mg by mouth at bedtime.   TURMERIC CURCUMIN PO Take 2 capsules by mouth daily.        Allergies:  Allergies  Allergen Reactions   Codeine Anaphylaxis   Penicillins Anaphylaxis and Other (See Comments)    Has patient had a PCN reaction causing immediate rash, facial/tongue/throat swelling, SOB or lightheadedness with hypotension: Yes Has patient had a PCN reaction causing severe rash involving mucus membranes or skin necrosis: No Has patient had a PCN reaction that required hospitalization: Yes Has patient had a PCN reaction occurring within the last 10 years: No If all of the above answers are "NO", then may  proceed with Cephalosporin use.  Has patient had a PCN reaction causing immediate rash, facial/tongue/throat swelling, SOB or lightheadedness with hypotension: Yes Has patient had a PCN reaction causing severe rash involving mucus membranes or skin necrosis: No Has patient had a PCN reaction that required hospitalization: Yes Has patient had a PCN reaction occurring within the last 10 years: No If all of the above answers are "NO", then may proceed with Cephalosporin use. Has patient had a PCN reaction causing immediate rash, facial/tongue/throat swelling, SOB or lightheadedness with hypotension: Yes Has patient had a PCN reaction causing severe rash involving mucus membranes or skin necrosis: No Has patient had a PCN reaction that required hospitalization: Yes Has patient had a PCN reaction occurring within the last 10 years: No If all of the above answers are "NO", then may proceed with Cephalosporin use.   Sulfa Antibiotics Hives   Tape Other (See Comments)    Large Boils-PAPER TAPE OK TO USE Large Boils-PAPER TAPE OK TO USE     Family History: Family History  Problem Relation Age of Onset   Hyperlipidemia Father    Epilepsy Brother    ADD / ADHD Brother    Breast cancer Paternal Grandmother 87       Passed away at 45yo   Asthma Mother     Social History:  reports that she has never smoked. She has never used smokeless tobacco. She reports that she does not drink alcohol and does not use drugs.   Physical Exam: BP 112/76   Pulse 96   Ht 5\' 4"  (1.626 m)   Wt 215 lb (97.5 kg)   BMI 36.90 kg/m   Constitutional:  Alert and oriented, No acute distress. HEENT: [place] AT Respiratory: Normal respiratory effort, no increased work of breathing. Psychiatric: Normal mood and affect.   Pertinent Imaging: KUB performed earlier today was personally reviewed and interpreted. The punctate left renal calculus is not noted on today's X-ray   Assessment & Plan:    1. Left nephrolithiasis Calculus not seen on today's X-ray and she thinks she may have passed a small stone recently.  If the stone is still present, no significant growth.  Follow-up prn recurrent stone symptoms  I have reviewed the above documentation for accuracy and completeness, and I agree with the above.   Geraline Knapp, MD  [facility] [address] [place], [place] [address] [phone]

## 2023-12-04 ENCOUNTER — Ambulatory Visit: Admit: 2023-12-04 | Discharge: 2023-12-05 | Payer: Medicaid (Managed Care)

## 2023-12-04 DIAGNOSIS — L732 Hidradenitis suppurativa: Principal | ICD-10-CM

## 2023-12-04 DIAGNOSIS — Z79899 Other long term (current) drug therapy: Principal | ICD-10-CM

## 2023-12-04 MED ADMIN — triamcinolone acetonide (KENALOG-40) injection 40 mg: 40 mg | @ 12:00:00 | Stop: 2023-12-04

## 2023-12-04 NOTE — Unmapped (Signed)
 Dermatology Note    Assessment and Plan:      Hidradenitis Suppurativa (HS): Hurley Stage 2, chronic, continues to flare around menses, not at patient goal  - S/p unroofing procedure in the L perianal area, R perianal area, and L labia majora (partial labial removal) on 12/04/2022  - previous treatments: Humira (helpful but difficulty maintaining insurance coverage)  - We discussed the typical natural history, pathogenesis, treatment options, and expected course as well as the relapsing and sometimes recalcitrant nature of the disease.    - Patient previously elected to avoid infliximab due to side effects. Renewed discussion of change to infliximab and Bimzelx today and patient may consider in the future if higher frequency Cosentyx continues to be ineffective. Tentative plan to transition to Bimzelx in 1-2 months if still flaring significantly on Cosentyx  - Patient is unable to take rifampin because it interacts with her seizure medications (briviact and felbatol)  - Continue secukinumab 300 mg/2 mL (150 mg/mL) PnIj; Inject 300 mg under the skin every 2 weeks (14) days. Maintenance dosing.    - Continue metformin 500 mg BID. Side effects discussed. Refilled today  - Continue spironolactone (ALDACTONE) 100 MG tablet; Take 2 tablets (200 mg total) by mouth daily.   - Continue hydrocodone; every 6 hours as needed for pain  - Joint decision made to proceed with ILK. See procedure note below.    Intralesional Kenalog Procedure Note: After the patient was informed of risks (including atrophy and dyspigmentation), benefits and side effects of intralesional steroid injection, the patient elected to undergo injection and verbal consent was obtained. Skin was cleaned with alcohol and injected intralesionally into the sites (below). The patient tolerated the procedure well without complications and was instructed on post-procedure care.  Location(s): bilateral inguinal creases, perineum, L medial thigh  Number of sites treated: 4  Kenalog (triamcinolone) Concentration: 40 mg/ml   Volume: 1.0 ml total    High risk medication use (Cosentyx)  - Negative Quant gold obtained 07/03/2022  - Adalimumab/Adalimumab Ab slightly low 07/03/2022 so was unable to increase Humira dosage      The patient was advised to call for an appointment should any new, changing, or symptomatic lesions develop.     RTC: Return for Next scheduled follow up. or sooner as needed   _________________________________________________________________      Chief Complaint     Chief Complaint   Patient presents with    HS      Flare in groin area which has been going on for two weeks        HPI     Erica Norman is a 43 y.o. female who presents as a returning patient (last seen by Dr. Jann Melody on 11/04/2023) to Dermatology for follow up of hidradenitis suppurativa. At last visit, patient was to increase Cosentyx to every 2 weeks    Today, patient reports increasing frequency of Cosentyx but has only had a couple doses and hasn't noticed a change. Overall, Cosentyx has not been very helpful.    She is flaring in the groin area with a few inflammatory, draining lesions that are very painful. On the inguinal creases, labia, and medial thighs    The patient denies any other new or changing lesions or areas of concern.     Pertinent Past Medical History     Pulled from prior notes:    HS Disease course:  Year when symptoms first noticed: Age 42   Year of diagnosis: Age 7  Who  diagnosed you? Dermatologist  Location of first symptoms: axillae  Typical involved areas include: groin  Typical number of inflammatory lesions each month at baseline (from first visit): three to five  Disease triggers: stress, sweat and menstrual cycle     Are menstrual cycles irregular when not on birth control? No.  Current form of contraception: None  Effect of hormonal contraception on disease: n/a  Flaring with menstrual cycle (before, during, or after?): during  Difficulty becoming pregnant? n/a  Pregnancy complications? never pregnant    How many children? 0  Better or worse with pregnancy?  never pregnant.  If so, worse during a specific trimester? never pregnant       Prior treatments:  Topical: Clobetasol  Systemic: Keflex, Doxycyline, Humira titrated up to 80mg  weekly, but stopped in summer 2024 due to insurance issues, Clindamycin  Past surgical procedures: Yes -bilateral wide excisions of the axilla healed well since 2008 or 2009.  She had more recent excisions in the bilateral inguinal folds in 2016-2018.  These have mostly healed well but she has some persistent areas in particular along the infra abdominal fold and one in the right inguinal fold  Past laser procedures: No     Past Medical History, Family History, Social History, Medication List, Allergies, and Problem List were reviewed in the rooming section of Epic.     ROS: Other than symptoms mentioned in the HPI, no fevers, chills, or other skin complaints    Physical Examination     Gen: Well-appearing patient, appropriate, interactive, in no acute distress  SKIN (Focal Skin Exam): Per patient request, examination of bilateral axillae, chest, abdomen, bilateral thighs, groin, buttocks, and external genitalia was performed    location Abscess Inflamed nodule Non-inflamed nodule Draining sinus Non-draining Sinus Hurley % scar   R axilla          L axilla          R inframammary          L inframammary          Intermammary          Pubic          R inguinal  1  1      R thigh          L inguinal  1   1     L thigh  1        Scrotum/Vulva          Perianal          R buttock          L buttock  1        Other (list)                      AN count (total sum of abscess and inflammatory nodule): 4  Pilonidal sinus? No

## 2023-12-04 NOTE — Unmapped (Signed)
 I saw and evaluated the patient, participating in the key elements of the service.  I discussed the findings, assessment and plan with the resident and agree with resident???s findings and plan as documented in the resident's note.  I was immediately available for the entirety of the procedure(s) and present for the key and critical portions. Leonette Nutting, MD

## 2023-12-04 NOTE — Unmapped (Signed)
 Self-reported severity (0-5): 4  In the past 7 days, how much has HS affected your quality of life (choose one)? Very Much  VAS pain today:3  VAS average pain for the last month: 5  Requiring pain medication? Yes.  If so, what type/frequency? Hydrocodone and Alieve    How often in pain?  few times a day  Level of odor (0-5): 4  Level of itching (0-5): 2  Dressing changes needed for drainage:Several times a day  How much drainage: some drainage  Flare in the last month (Y/N)? Yes.  How long ago was the last flare? N/a  Developing new lesions? once a week  Number of inflammatory lesions montly: n/a  DLQI: 20  Current treatment: n/a     How helpful is the current treatment in managing the following aspects of your disease?  Not at all helpful Somewhat helpful Very helpful   Pain      Decreasing length of flares      Decreasing new lesions x     Drainage x     Decreasing frequency of flares      Decreasing severity of flares x     Odor x       Vaping History    Do you currently OR do you have a history of using vaping devices or E-cigarettes that contain nicotine? Never Vaper (if never vaped, skip to cannabis section)  How long have you OR had you vaped/used a nicotine e-cigarette for? N/a (years)  How often do you currently or did you in the past use an e-cigarette or vaping device? N/a  How many cartridges do you/did you previously consume in an average month? N/a  What size are/were the cartridges (refillable/single use) you use/used? N/a  Did you start vaping as an alternative to cigarette smoking? N/a  How long before or after you started vaping did your HS symptoms arise for the first time? N/a  How has vaping affected your HS? N/a    Cannabis Use History     SELECT YES HERE IF PATIENT DECLINED TO ANSWER QUESTIONS ABOUT CANNABIS USE     Do you currently OR do you have a history of using cannabis/Marijuana/THC? Never used cannabis (if never used, skip rest of section)  How long have you OR had you used cannabis/Marijuana/THC for? N/a (years)  How often do you currently or did you in the past use cannabis/Marijuana/THC? N/a  Which of the following methods do you use to consume cannabis (select all that apply)? N/a  How many grams of marijuana do you (or did you) use in a typical week? N/a  If using edibles, how many milligrams of THC do you normally ingest per session? N/a milligrams? Or unsure? N/a  How long before or after you started using cannabis did your HS symptoms arise for the first time? N/a  How has consuming cannabis affected your HS? N/a  On a scale of 0-10, how has consuming cannabis/marijuana/THC impacted your pain from HS? (with 0 being no improvement at all and 10 being completely alleviates the pain) n/a

## 2023-12-09 NOTE — Unmapped (Signed)
 [facility] Refill Coordination Note    Specialty Medication(s) to be Shipped:   Inflammatory Disorders: Cosentyx    Other medication(s) to be shipped: No additional medications requested for fill at this time     Erica Norman, DOB: 25-Jul-1980  Phone: [phone] (home)       All above HIPAA information was verified with patient.     Was a Nurse, learning disability used for this call? No    Completed refill call assessment today to schedule patient's medication shipment from the [facility]  [phone]).  All relevant notes have been reviewed.     Specialty medication(s) and dose(s) confirmed: Regimen is correct and unchanged.   Changes to medications: [name] reports no changes at this time.  Changes to insurance: No  New side effects reported not previously addressed with a pharmacist or physician: None reported  Questions for the pharmacist: No    Confirmed patient received a Conservation officer, historic buildings and a Surveyor, mining with first shipment. The patient will receive a drug information handout for each medication shipped and additional FDA Medication Guides as required.       DISEASE/MEDICATION-SPECIFIC INFORMATION        For patients on injectable medications: Patient currently has 0 doses left.  Next injection is scheduled for 12/13/2023.    SPECIALTY MEDICATION ADHERENCE     Medication Adherence    Patient reported X missed doses in the last month: 0  Specialty Medication: COSENTYX UNOREADY PEN 300 mg/2 mL Pnij (secukinumab)  Patient is on additional specialty medications: No              Were doses missed due to medication being on hold? No    COSENTYX UNOREADY PEN 300 mg/2 mL Pnij (secukinumab) : 0 doses of medicine on hand       REFERRAL TO PHARMACIST     Referral to the pharmacist: Not needed      SHIPPING     Shipping address confirmed in Epic.     Cost and Payment: Patient has a copay of $4. They are aware and have authorized the pharmacy to charge the credit card on file.    Delivery Scheduled: Yes, Expected medication delivery date: 12/12/2023.     Medication will be delivered via Same Day Courier to the prescription address in Epic WAM.    [name]   [facility]  Specialty Technician

## 2023-12-10 ENCOUNTER — Encounter: Payer: Self-pay | Admitting: Family Medicine

## 2023-12-10 ENCOUNTER — Ambulatory Visit: Admitting: Family Medicine

## 2023-12-10 VITALS — BP 120/80 | HR 84 | Temp 97.9°F | Resp 16 | Ht 64.0 in | Wt 206.0 lb

## 2023-12-10 DIAGNOSIS — L03317 Cellulitis of buttock: Secondary | ICD-10-CM | POA: Diagnosis not present

## 2023-12-10 DIAGNOSIS — L0291 Cutaneous abscess, unspecified: Secondary | ICD-10-CM

## 2023-12-10 DIAGNOSIS — D75839 Thrombocytosis, unspecified: Secondary | ICD-10-CM | POA: Diagnosis not present

## 2023-12-10 DIAGNOSIS — L732 Hidradenitis suppurativa: Secondary | ICD-10-CM | POA: Diagnosis not present

## 2023-12-10 DIAGNOSIS — G809 Cerebral palsy, unspecified: Secondary | ICD-10-CM | POA: Diagnosis not present

## 2023-12-10 DIAGNOSIS — G40909 Epilepsy, unspecified, not intractable, without status epilepticus: Secondary | ICD-10-CM

## 2023-12-10 DIAGNOSIS — L0231 Cutaneous abscess of buttock: Secondary | ICD-10-CM | POA: Diagnosis not present

## 2023-12-10 DIAGNOSIS — Z79899 Other long term (current) drug therapy: Secondary | ICD-10-CM

## 2023-12-10 DIAGNOSIS — E782 Mixed hyperlipidemia: Secondary | ICD-10-CM

## 2023-12-10 DIAGNOSIS — E559 Vitamin D deficiency, unspecified: Secondary | ICD-10-CM | POA: Diagnosis not present

## 2023-12-10 DIAGNOSIS — D84821 Immunodeficiency due to drugs: Secondary | ICD-10-CM | POA: Diagnosis not present

## 2023-12-10 DIAGNOSIS — K219 Gastro-esophageal reflux disease without esophagitis: Secondary | ICD-10-CM

## 2023-12-10 DIAGNOSIS — Z1231 Encounter for screening mammogram for malignant neoplasm of breast: Secondary | ICD-10-CM

## 2023-12-10 MED ORDER — CLINDAMYCIN HCL 300 MG PO CAPS: 300.0000 mg | ORAL_CAPSULE | Freq: Two times a day (BID) | ORAL | 2 refills | Status: AC

## 2023-12-10 NOTE — Assessment & Plan Note (Signed)
 Reviewed her last LP and ASCVD risk score LDL is high and risk is 1.1%  With LDL >160 she can choose to start statin or continue to work on only diet/lifestyle efforts due to low ASCVD risk

## 2023-12-10 NOTE — Patient Instructions (Signed)
 Return for lab work in another month or so Take Vit D 5000 IU every other day or get Vit D 3 2000 and you can take that daily  I will send in antibiotics and please let your HS doc know about this latest lesion  We will call you with culture results in a few days

## 2023-12-10 NOTE — Assessment & Plan Note (Signed)
 Med changes per specialists, more lesions/flares, she asks for wound culture and abx refill for covering flares Did culture and sent in clinda (per past specialists management of flares) did advise pt to f/up with dermatology and notify them about current lesions even though she just saw them

## 2023-12-10 NOTE — Progress Notes (Signed)
 Name: Destiny Flores   MRN: [id number]    DOB: Nov 15, 1980   Date:12/10/2023       Progress Note  Chief Complaint  Patient presents with   Medical Management of Chronic Issues   Hyperlipidemia   Gastroesophageal Reflux    Subjective:   Destiny Flores is a 43 y.o. female, presents to clinic for routine follow up on chronic conditions  Here for routine f/up on HLD, vit d deficiency and GERD  HS flare with lesion to buttock, she just saw dermatology and this came up right after, she's having low grade fever and body aches, drainage is thicker purulent and usually lesions are clear, she would like a wound culture - she has only old expired abx at home and she hasn't called her specialists yet The pain was severe and it has improved since draining  Hyperlipidemia: Not on meds, pt didn't understand last result note from float provider - reviewed labs and past result notes and recommendations Last Lipids: Lab Results  Component Value Date   CHOL 241 (H) 07/05/2023   HDL 50 07/05/2023   LDLCALC 167 (H) 07/05/2023   TRIG 117 07/05/2023   CHOLHDL 4.8 07/05/2023  The 10-year ASCVD risk score (Arnett DK, et al., 2019) is: 1.1%   Values used to calculate the score:     Age: 31 years     Sex: Female     Is Non-Hispanic African American: No     Diabetic: No     Tobacco smoker: No     Systolic Blood Pressure: 120 mmHg     Is BP treated: No     HDL Cholesterol: 50 mg/dL     Total Cholesterol: 241 mg/dL   GERD on protonix  once daily    Last vitamin D , Vit D 3 5000 IU since may 28  Lab Results  Component Value Date   VD25OH 22 (L) 07/05/2023   She notices she does not heal as well when low on zinc and vit D so she recently started her supplements again     Current Outpatient Medications:    ascorbic acid (VITAMIN C) 1000 MG tablet, Take by mouth., Disp: , Rfl:    Brivaracetam (BRIVIACT) 100 MG TABS, Take 1 tablet by mouth 2 (two) times daily., Disp: , Rfl:    clindamycin   (CLEOCIN ) 300 MG capsule, Take 1 capsule (300 mg total) by mouth 2 (two) times daily. Take for 7 d for HS flares, Disp: 60 capsule, Rfl: 0   felbamate  (FELBATOL ) 600 MG tablet, Take 600 mg by mouth 3 (three) times daily., Disp: , Rfl:    magnesium oxide (MAG-OX) 400 MG tablet, Take by mouth., Disp: , Rfl:    Melatonin 3 MG CAPS, Take by mouth., Disp: , Rfl:    ondansetron  (ZOFRAN -ODT) 4 MG disintegrating tablet, Take 1 tablet (4 mg total) by mouth every 8 (eight) hours as needed., Disp: 20 tablet, Rfl: 0   pantoprazole  (PROTONIX ) 40 MG tablet, TAKE 1 TABLET(40 MG) BY MOUTH TWICE DAILY, Disp: 180 tablet, Rfl: 0   pyridOXINE (VITAMIN B-6) 100 MG tablet, Take 100 mg by mouth daily., Disp: , Rfl:    spironolactone (ALDACTONE) 100 MG tablet, Take by mouth., Disp: , Rfl:    SUMAtriptan  (IMITREX ) 50 MG tablet, Take 1 tablet (50 mg total) by mouth daily as needed., Disp: 10 tablet, Rfl: 1   tiZANidine (ZANAFLEX) 2 MG tablet, Take 2 mg by mouth 2 (two) times daily as needed., Disp: , Rfl:  topiramate (TOPAMAX) 25 MG tablet, Take 50 mg by mouth at bedtime., Disp: , Rfl:    TURMERIC CURCUMIN PO, Take 2 capsules by mouth daily., Disp: , Rfl:    Vitamin D , Ergocalciferol , (DRISDOL) 1.25 MG (50000 UNIT) CAPS capsule, Take 50,000 Units by mouth every 7 (seven) days., Disp: , Rfl:    zinc gluconate 50 MG tablet, Take 50 mg by mouth daily., Disp: , Rfl:    metFORMIN (GLUCOPHAGE) 500 MG tablet, Take by mouth., Disp: , Rfl:   Patient Active Problem List   Diagnosis Date Noted   History of kidney stones 10/17/2021   Allergic rhinitis 10/31/2020   Immunosuppression due to drug therapy (HCC) 05/17/2020   Thrombocytosis 05/17/2020   Hyperlipidemia 05/17/2020   Gastroesophageal reflux disease without esophagitis 05/17/2020   Chronic sinusitis 05/17/2020   Vitamin D  deficiency 03/06/2018   Cerebral palsy (HCC) 12/24/2017   Epilepsy (HCC) 12/24/2017   Hidradenitis suppurativa 12/24/2017   Status post VNS (vagus  nerve stimulator) placement 12/24/2017    Past Surgical History:  Procedure Laterality Date   abscess surgery Bilateral 2006   armpits and groin.12 surgeries overall.    AXILLARY HIDRADENITIS EXCISION     MULTIPLE HYDRADENITIS SURGERIES IN [place]    hidradenitis groin Left 04/2017   last surgery done on groin and the symptoms have returned   HYDRADENITIS EXCISION N/A 01/16/2018   Procedure: EXCISION HIDRADENITIS GROIN;  Surgeon: Alben Alma, MD;  Location: [facility] ORS;  Service: General;  Laterality: N/A;   KNEE SURGERY Left 2010   tendon and meniscus repair   NOSE SURGERY  1998   deviated septum and sinus repair   ROBOTIC ASSISTED LAPAROSCOPIC CHOLECYSTECTOMY N/A 07/24/2018   Procedure: ROBOTIC ASSISTED LAPAROSCOPIC CHOLECYSTECTOMY;  Surgeon: Alben Alma, MD;  Location: [facility];  Service: General;  Laterality: N/A;   TONSILLECTOMY     TYMPANOSTOMY TUBE PLACEMENT     as a kid   VAGUS NERVE STIMULATOR GENERATOR CHANGE  03/2015    Family History  Problem Relation Age of Onset   Hyperlipidemia Father    Epilepsy Brother    ADD / ADHD Brother    Breast cancer Paternal Grandmother 14       Passed away at 32yo   Asthma Mother     Social History   Tobacco Use   Smoking status: Never   Smokeless tobacco: Never  Vaping Use   Vaping status: Never Used  Substance Use Topics   Alcohol use: Never   Drug use: Never     Allergies  Allergen Reactions   Codeine Anaphylaxis   Penicillins Anaphylaxis and Other (See Comments)    Has patient had a PCN reaction causing immediate rash, facial/tongue/throat swelling, SOB or lightheadedness with hypotension: Yes Has patient had a PCN reaction causing severe rash involving mucus membranes or skin necrosis: No Has patient had a PCN reaction that required hospitalization: Yes Has patient had a PCN reaction occurring within the last 10 years: No If all of the above answers are "NO", then may proceed with Cephalosporin use.  Has  patient had a PCN reaction causing immediate rash, facial/tongue/throat swelling, SOB or lightheadedness with hypotension: Yes Has patient had a PCN reaction causing severe rash involving mucus membranes or skin necrosis: No Has patient had a PCN reaction that required hospitalization: Yes Has patient had a PCN reaction occurring within the last 10 years: No If all of the above answers are "NO", then may proceed with Cephalosporin use. Has patient had a PCN  reaction causing immediate rash, facial/tongue/throat swelling, SOB or lightheadedness with hypotension: Yes Has patient had a PCN reaction causing severe rash involving mucus membranes or skin necrosis: No Has patient had a PCN reaction that required hospitalization: Yes Has patient had a PCN reaction occurring within the last 10 years: No If all of the above answers are "NO", then may proceed with Cephalosporin use.   Sulfa Antibiotics Hives   Tape Other (See Comments)    Large Boils-PAPER TAPE OK TO USE Large Boils-PAPER TAPE OK TO USE    Health Maintenance  Topic Date Due   MAMMOGRAM  10/31/2023   COVID-19 Vaccine (6 - 2024-25 season) 12/25/2023 (Originally 03/03/2023)   Pneumococcal Vaccine 67-57 Years old (2 of 2 - PCV) 12/09/2024 (Originally 06/09/2021)   INFLUENZA VACCINE  01/31/2024   Cervical Cancer Screening (HPV/Pap Cotest)  11/16/2026   DTaP/Tdap/Td (2 - Td or Tdap) 03/06/2028   Hepatitis C Screening  Completed   HIV Screening  Completed   HPV VACCINES  Aged Out   Meningococcal B Vaccine  Aged Out    Chart Review Today: I personally reviewed active problem list, medication list, allergies, family history, social history, health maintenance, notes from last encounter, lab results, imaging with the patient/caregiver today.   Review of Systems  Constitutional: Negative.   HENT: Negative.    Eyes: Negative.   Respiratory: Negative.    Cardiovascular: Negative.   Gastrointestinal: Negative.   Endocrine: Negative.    Genitourinary: Negative.   Musculoskeletal: Negative.   Skin: Negative.   Allergic/Immunologic: Negative.   Neurological: Negative.   Hematological: Negative.   Psychiatric/Behavioral: Negative.    All other systems reviewed and are negative.    Objective:   Vitals:   12/10/23 1510  BP: 120/80  Pulse: 84  Resp: 16  Temp: 97.9 F (36.6 C)  SpO2: 99%  Weight: 206 lb (93.4 kg)  Height: 5\' 4"  (1.626 m)    Body mass index is 35.36 kg/m.  Physical Exam Vitals and nursing note reviewed. Exam conducted with a chaperone present.  Constitutional:      General: She is not in acute distress.    Appearance: Normal appearance. She is well-developed. She is obese. She is not ill-appearing, toxic-appearing or diaphoretic.  HENT:     Head: Normocephalic and atraumatic.     Right Ear: External ear normal.     Left Ear: External ear normal.     Nose: Nose normal.  Eyes:     General: No scleral icterus.       Right eye: No discharge.        Left eye: No discharge.     Conjunctiva/sclera: Conjunctivae normal.  Neck:     Trachea: No tracheal deviation.  Cardiovascular:     Rate and Rhythm: Normal rate.  Pulmonary:     Effort: Pulmonary effort is normal. No respiratory distress.     Breath sounds: No stridor.  Skin:    General: Skin is warm.     Findings: Wound present. No rash.     Comments: Two open wounds to right buttock no purulent drainage, tenderness - culture swab obtained  Neurological:     Mental Status: She is alert.     Motor: No abnormal muscle tone.     Coordination: Coordination normal.     Gait: Gait normal.  Psychiatric:        Mood and Affect: Mood normal.        Behavior: Behavior normal.  Functional Status Survey:   Results for orders placed or performed in visit on 07/05/23  CBC w/Diff/Platelet   Collection Time: 07/05/23  9:57 AM  Result Value Ref Range   WBC 13.8 (H) 3.8 - 10.8 Thousand/uL   RBC 4.43 3.80 - 5.10 Million/uL   Hemoglobin  13.3 11.7 - 15.5 g/dL   HCT 54.0 98.1 - 19.1 %   MCV 90.7 80.0 - 100.0 fL   MCH 30.0 27.0 - 33.0 pg   MCHC 33.1 32.0 - 36.0 g/dL   RDW 47.8 29.5 - 62.1 %   Platelets 584 (H) 140 - 400 Thousand/uL   MPV 9.2 7.5 - 12.5 fL   Neutro Abs 9,660 (H) 1,500 - 7,800 cells/uL   Absolute Lymphocytes 2,884 850 - 3,900 cells/uL   Absolute Monocytes 897 200 - 950 cells/uL   Eosinophils Absolute 290 15 - 500 cells/uL   Basophils Absolute 69 0 - 200 cells/uL   Neutrophils Relative % 70 %   Total Lymphocyte 20.9 %   Monocytes Relative 6.5 %   Eosinophils Relative 2.1 %   Basophils Relative 0.5 %  COMPLETE METABOLIC PANEL WITH GFR   Collection Time: 07/05/23  9:57 AM  Result Value Ref Range   Glucose, Bld 92 65 - 99 mg/dL   BUN 17 7 - 25 mg/dL   Creat 3.08 6.57 - 8.46 mg/dL   eGFR 962 > OR = 60 XB/MWU/1.32G4   BUN/Creatinine Ratio SEE NOTE: 6 - 22 (calc)   Sodium 138 135 - 146 mmol/L   Potassium 4.4 3.5 - 5.3 mmol/L   Chloride 105 98 - 110 mmol/L   CO2 22 20 - 32 mmol/L   Calcium 9.3 8.6 - 10.2 mg/dL   Total Protein 6.8 6.1 - 8.1 g/dL   Albumin 4.1 3.6 - 5.1 g/dL   Globulin 2.7 1.9 - 3.7 g/dL (calc)   AG Ratio 1.5 1.0 - 2.5 (calc)   Total Bilirubin 0.2 0.2 - 1.2 mg/dL   Alkaline phosphatase (APISO) 82 31 - 125 U/L   AST 14 10 - 30 U/L   ALT 15 6 - 29 U/L  Lipid Profile   Collection Time: 07/05/23  9:57 AM  Result Value Ref Range   Cholesterol 241 (H) <200 mg/dL   HDL 50 > OR = 50 mg/dL   Triglycerides 010 <272 mg/dL   LDL Cholesterol (Calc) 167 (H) mg/dL (calc)   Total CHOL/HDL Ratio 4.8 <5.0 (calc)   Non-HDL Cholesterol (Calc) 191 (H) <130 mg/dL (calc)  Vitamin D  (25 hydroxy)   Collection Time: 07/05/23  9:57 AM  Result Value Ref Range   Vit D, 25-Hydroxy 22 (L) 30 - 100 ng/mL      Assessment & Plan:   Gastroesophageal reflux disease without esophagitis Assessment & Plan: Sx have stayed well controlled with protonix  once daily    Hidradenitis suppurativa Assessment &  Plan: Med changes per specialists, more lesions/flares, she asks for wound culture and abx refill for covering flares Did culture and sent in clinda (per past specialists management of flares) did advise pt to f/up with dermatology and notify them about current lesions even though she just saw them  Orders: -     3D Screening Mammogram, Left and Right; Future -     WOUND CULTURE -     Clindamycin  HCl; Take 1 capsule (300 mg total) by mouth 2 (two) times daily. Take for 7 d for HS flares  Dispense: 14 capsule; Refill: 2  Cerebral palsy, unspecified type (  HCC) Assessment & Plan: Managed by specialists   Nonintractable epilepsy without status epilepticus, unspecified epilepsy type (HCC) Assessment & Plan: Managed by specialists   Mixed hyperlipidemia Assessment & Plan: Reviewed her last LP and ASCVD risk score LDL is high and risk is 1.1%  With LDL >160 she can choose to start statin or continue to work on only diet/lifestyle efforts due to low ASCVD risk  Orders: -     Comprehensive metabolic panel with GFR -     Lipid panel  Breast cancer screening by mammogram -     3D Screening Mammogram, Left and Right; Future  High risk medications (not anticoagulants) long-term use -     3D Screening Mammogram, Left and Right; Future -     WOUND CULTURE -     CBC with Differential/Platelet -     Comprehensive metabolic panel with GFR  Cellulitis and abscess of buttock -     3D Screening Mammogram, Left and Right; Future -     WOUND CULTURE  Hidradenitis suppurativa Assessment & Plan: Med changes per specialists, more lesions/flares, she asks for wound culture and abx refill for covering flares Did culture and sent in clinda (per past specialists management of flares) did advise pt to f/up with dermatology and notify them about current lesions even though she just saw them  Orders: -     3D Screening Mammogram, Left and Right; Future -     WOUND CULTURE -     Clindamycin  HCl; Take  1 capsule (300 mg total) by mouth 2 (two) times daily. Take for 7 d for HS flares  Dispense: 14 capsule; Refill: 2  Abscess -     Clindamycin  HCl; Take 1 capsule (300 mg total) by mouth 2 (two) times daily. Take for 7 d for HS flares  Dispense: 14 capsule; Refill: 2  Immunosuppression due to drug therapy [employer] [facility] [employer]) Assessment & Plan: Lower threshold to cover with Abx Would culture today  Orders: -     CBC with Differential/Platelet -     Comprehensive metabolic panel with GFR  Thrombocytosis Assessment & Plan: Past CBC was near her baseline, reviewed labs with her Will not recheck today with severe abscess/flare  She can come for labs when well in about a month +  Orders: -     CBC with Differential/Platelet  Vitamin D  deficiency Assessment & Plan: Last vit d 22 and she just restarted 5000 IU supplement less than 2 weeks ago, would recheck labs after on supplement for more than 1-2 months. In the past she was taking 5000 vit D 3 IU daily and it caused multiple lab abnormalities (likely too high a dose) advised to take 5000 IU every other day or get 2000 IU to take daily   Orders: -     [facility] 25-Hydroxyvit D D2/D3 -     Comprehensive metabolic panel with GFR    Labs in 1-2 months when well  Return in about 6 months (around 06/10/2024) for Annual Physical.   Adeline Hone, PA-C 12/10/23 3:31 PM

## 2023-12-10 NOTE — Assessment & Plan Note (Signed)
 Last vit d 22 and she just restarted 5000 IU supplement less than 2 weeks ago, would recheck labs after on supplement for more than 1-2 months. In the past she was taking 5000 vit D 3 IU daily and it caused multiple lab abnormalities (likely too high a dose) advised to take 5000 IU every other day or get 2000 IU to take daily

## 2023-12-10 NOTE — Assessment & Plan Note (Signed)
Managed by specialists

## 2023-12-10 NOTE — Assessment & Plan Note (Signed)
 Sx have stayed well controlled with protonix  once daily

## 2023-12-10 NOTE — Assessment & Plan Note (Signed)
 Past CBC was near her baseline, reviewed labs with her Will not recheck today with severe abscess/flare  She can come for labs when well in about a month +

## 2023-12-10 NOTE — Assessment & Plan Note (Signed)
 Lower threshold to cover with Abx Would culture today

## 2023-12-12 LAB — WOUND CULTURE
MICRO NUMBER:: 16561600
SPECIMEN QUALITY:: ADEQUATE

## 2023-12-12 MED FILL — COSENTYX UNOREADY PEN 300 MG/2 ML SUBCUTANEOUS PEN INJECTOR: SUBCUTANEOUS | 28 days supply | Qty: 4 | Fill #1

## 2023-12-13 ENCOUNTER — Ambulatory Visit: Payer: Self-pay | Admitting: Family Medicine

## 2023-12-18 DIAGNOSIS — L732 Hidradenitis suppurativa: Principal | ICD-10-CM

## 2023-12-18 MED ORDER — SPIRONOLACTONE 100 MG TABLET
ORAL_TABLET | Freq: Every day | ORAL | 3 refills | 90.00000 days
Start: 2023-12-18 — End: 2024-12-17

## 2023-12-19 MED ORDER — SPIRONOLACTONE 100 MG TABLET
ORAL_TABLET | Freq: Every day | ORAL | 3 refills | 90.00000 days | Status: CP
Start: 2023-12-19 — End: 2024-12-18

## 2023-12-20 DIAGNOSIS — L732 Hidradenitis suppurativa: Principal | ICD-10-CM

## 2023-12-20 MED ORDER — BIMZELX AUTOINJECTOR 320 MG/2 ML SUBCUTANEOUS AUTO-INJECTOR
SUBCUTANEOUS | 0 refills | 0.00000 days | Status: CP
Start: 2023-12-20 — End: 2024-03-28
  Filled 2024-01-06: qty 12, 84d supply, fill #0

## 2023-12-24 DIAGNOSIS — L732 Hidradenitis suppurativa: Principal | ICD-10-CM

## 2024-01-02 ENCOUNTER — Encounter: Payer: Self-pay | Admitting: Family Medicine

## 2024-01-02 ENCOUNTER — Ambulatory Visit: Payer: Self-pay | Admitting: Family Medicine

## 2024-01-02 NOTE — Unmapped (Signed)
 [facility] Specialty Medication Onboarding    Specialty Medication: BIMZELX  AUTOINJECTOR 320 mg/2 mL Atin (bimekizumab -bkzx)  Prior Authorization: Approved   Financial Assistance: No - copay  <$25  Final Copay/Day Supply: ($4 / 84 LD) ($4 / 28 MD)    Insurance Restrictions: Yes - Max 90ds    Notes to Pharmacist:   Credit Card on File: yes  Start Date on Rx:  12/20/23    The triage team has completed the benefits investigation and has determined that the patient is able to fill this medication at [facility]. Please contact the patient to complete the onboarding or follow up with the prescribing physician as needed.

## 2024-01-02 NOTE — Unmapped (Signed)
 [facility]    Patient Onboarding/Medication Counseling    Erica Norman is a 43 y.o. female with hidradenitis suppurativa who I am counseling today on initiation of therapy.  I am speaking to the patient.    Was a Nurse, learning disability used for this call? No    Verified patient's date of birth / HIPAA.    Specialty medication(s) to be sent: Inflammatory Disorders: Bimzelx       Non-specialty medications/supplies to be sent: sharps kit      Medications not needed at this time: na         Bimzelx  (bimekizumab )    Medication & Administration     Dosage: Hidradenitis Suppurativa: Inject 320 mg at Week 0, 2, 4, 6, 8, 10, 12, 14 and 16, then every 4 weeks thereafter      Lab tests required prior to treatment initiation:  Tuberculosis: Tuberculosis screening resulted in a non-reactive Quantiferon TB Gold assay.  Liver Function Tests: LFTs, Alk Phos, and bilirubin are documented in the patient's chart.      Administration:     Prefilled auto-injector pen  - 320 mg pen.  Gather all supplies needed for injection on a clean, flat working surface: medication pen removed from packaging, alcohol swab, sharps container, etc.  Look at the medication label - look for correct medication, correct dose, and check the expiration date  Check the medicine through the viewing window. The medicine should be slightly pearly and colorless to pale brownish-yellow and free of particles. You may see air bubbles in the liquid. This is normal. Do not used the pre-filled pen if the medicine is cloudy, discolored, or has particles.   Lay the auto-injector pen on a flat surface and allow it to warm up to room temperature for at least 30-45 minutes  Select injection site - you can use the front of your thigh or your belly (but not the area 2 inches around your belly button); if someone else is giving you the injection you can also use your upper arm in the skin covering your triceps muscle  Prepare injection site - wash your hands and clean the skin at the injection site with an alcohol swab and let it air dry, do not touch the injection site again before the injection  Pull off the safety cap, do not remove until immediately prior to injection and do not touch the green needle cover  Put the green needle cover against your skin at the injection site at a 90 degree angle, hold the pen such that you can see the clear medication window  Press down and hold the pen firmly against your skin, there will be a click when the injection starts  Continue to hold the pen firmly against your skin for about 15 seconds - the window will start to turn solid yellow  There will be a second click sound when the injection is almost complete, verify the window is solid yellow to indicate the injection is complete and then pull the pen away from your skin  Dispose of the used auto-injector pen immediately in your sharps disposal container the needle will be covered automatically  If you see any blood at the injection site, press a cotton ball or gauze on the site and maintain pressure until the bleeding stops, do not rub the injection site    Prefilled syringe  1. Gather all supplies needed for injection on a clean, flat working surface: medication syringe(s) removed from packaging, alcohol swab,  sharps container, etc.  2. Look at the medication label - look for correct medication, correct dose, and check the expiration date  3. Look at the medication - the liquid in the syringe should appear slightly pearly and colorless to pale brownish-yellow  4. Lay the syringe on a flat surface and allow it to warm up to room temperature for at least 30-45 minutes  5. Select injection site - you can use the front of your thigh or your belly (but not the area 2 inches around your belly button)  6. Prepare injection site - wash your hands and clean the skin at the injection site with an alcohol swab and let it air dry, do not touch the injection site again before the injection  7. Pull off the needle safety cap, do not remove until immediately prior to injection  8. Pinch the skin - with your hand not holding the syringe pinch up a fold of skin at the injection site using your forefinger and thumb  9. Insert the needle into the fold of skin at about a 45 degree angle - it's best to use a quick dart-like motion  10.Push the plunger down slowly as far as it will go until the syringe is empty  11. Check that the syringe is empty then remove pressure from plunger head. This will cause the needle to automatically retract and lock.    12. Dispose of the used syringe immediately in your sharps disposal container, do not attempt to recap the needle prior to disposing  13. If you see any blood at the injection site, press a cotton ball or gauze on the site and maintain pressure until the bleeding stops, do not rub the injection site      Adherence/Missed dose instructions:  If your injection is given more than 4 days after your scheduled injection date - consult your pharmacist for additional instructions on how to adjust your dosing schedule.        Goals of Therapy     Plaque Psoriasis  Minimize areas of skin involvement (% BSA)  Avoidance of long term glucocorticoid use  Maintenance of effective psychosocial functioning    Hidradenitis Suppurativa  Reduce the frequency and severity of new lesions  Minimize pain and suppuration  Prevent disease progression and limit scarring  Maintenance of effective psychosocial functioning      Side Effects & Monitoring Parameters     Injection site reaction (redness, irritation, inflammation localized to the site of administration)  Signs of a common cold - minor sore throat, runny or stuffy nose, etc.  Diarrhea  Mood changes/suicidal ideation  Liver Function Tests    The following side effects should be reported to the provider:  Signs of a hypersensitivity reaction - rash; hives; itching; red, swollen, blistered, or peeling skin; wheezing; tightness in the chest or throat; difficulty breathing, swallowing, or talking; swelling of the mouth, face, lips, tongue, or throat; etc.  Reduced immune function - report signs of infection such as fever; chills; body aches; very bad sore throat; ear or sinus pain; cough; more sputum or change in color of sputum; pain with passing urine; wound that will not heal, etc.  Also at a slightly higher risk of some malignancies (mainly skin and blood cancers) due to this reduced immune function.  In the case of signs of infection - the patient should hold the next dose of Bimzelx ?? and call your primary care provider to ensure adequate medical care.  Treatment may  be resumed when infection is treated and patient is asymptomatic.  Muscle pain or weakness  Shortness of breath      Warnings, Precautions, & Contraindications     Have your bloodwork checked as you have been told by your prescriber  Talk with your doctor if you are pregnant, planning to become pregnant, or breastfeeding  Discuss the possible need for holding your dose(s) of Bimzelx ?? when a planned procedure is scheduled with the prescriber as it may delay healing/recovery timeline       Drug/Food Interactions     Medication list reviewed in Epic. The patient was instructed to inform the care team before taking any new medications or supplements. No drug interactions identified.   Talk with you prescriber or pharmacist before receiving any live vaccinations while taking this medication and after you stop taking it      Storage, Handling Precautions, & Disposal     Store this medication in the refrigerator.  Do not freeze  May store intact Bimzelx  pens and 160 mg/mL prefilled syringes at <=25??C (<=77??F) for up to 30 days; do NOT return to the refrigerator   Store in original packaging, protected from light  Do not shake  Dispose of used syringes/pens in a sharps disposal container           Current Medications (including OTC/herbals), Comorbidities and Allergies     Current Medications[1]    Allergies[2]    Problem List[3]    Medication list has been reviewed and updated in Epic: Yes    Allergies have been reviewed and updated in Epic: Yes    Appropriateness of Therapy     Acute infections noted within Epic:  No active infections  Patient reported infection: None    Is the medication and dose appropriate based on diagnosis, medication list, comorbidities, allergies, medical history, patient???s ability to self-administer the medication, and therapeutic goals? Yes    Prescription has been clinically reviewed: Yes      Baseline Quality of Life Assessment      How many days over the past month did your HS  keep you from your normal activities? For example, brushing your teeth or getting up in the morning. Patient declined to answer    Financial Information     Medication Assistance provided: Prior Authorization    Anticipated copay of $4 reviewed with patient. Verified delivery address.    Delivery Information     Scheduled delivery date: 7/7    Expected start date: 7/7      Medication will be delivered via Same Day Courier to the prescription address in [facility].  This shipment will not require a signature.      Explained the services we provide at [facility] and that each month we would call to set up refills.  Stressed importance of returning phone calls so that we could ensure they receive their medications in time each month.  Informed patient that we should be setting up refills 7-10 days prior to when they will run out of medication.  A pharmacist will reach out to perform a clinical assessment periodically.  Informed patient that a welcome packet, containing information about our pharmacy and other support services, a Notice of Privacy Practices, and a drug information handout will be sent.      The patient or caregiver noted above participated in the development of this care plan and knows that they can request review of or adjustments to the care plan  at any time.      Patient or caregiver verbalized understanding of the above information as well as how to contact the pharmacy at [phone] option 4 with any questions/concerns.  The pharmacy is open Monday through Friday 8:30am-4:30pm.  A pharmacist is available 24/7 via pager to answer any clinical questions they may have.    Patient Specific Needs     Does the patient have any physical, cognitive, or cultural barriers? No    Does the patient have adequate living arrangements? (i.e. the ability to store and take their medication appropriately) Yes    Did you identify any home environmental safety or security hazards? No    Patient prefers to have medications discussed with  Patient     Is the patient or caregiver able to read and understand education materials at a high school level or above? Yes    Patient's primary language is  English     Is the patient high risk? No    Does the patient have an additional or emergency contact listed in their chart? Yes    SOCIAL DETERMINANTS OF HEALTH     At the [facility], we have learned that life circumstances - like trouble affording food, housing, utilities, or transportation can affect the health of many of our patients.   That is why we wanted to ask: are you currently experiencing any life circumstances that are negatively impacting your health and/or quality of life? Patient declined to answer    Social Drivers of Health     Food Insecurity: Food Insecurity Present (06/30/2023)    Received from [facility]    Hunger Vital Sign     Worried About Running Out of Food in the Last Year: Sometimes true     Ran Out of Food in the Last Year: Sometimes true   Tobacco Use: Low Risk  (12/04/2023)    Patient History     Smoking Tobacco Use: Never     Smokeless Tobacco Use: Never     Passive Exposure: Not on file   Transportation Needs: No Transportation Needs (06/30/2023)    Received from [facility] - Transportation     Lack of Transportation (Medical): No Lack of Transportation (Non-Medical): No   Alcohol Use: Not on file   Housing: Not on file   Physical Activity: Inactive (06/30/2023)    Received from [facility]    Exercise Vital Sign     Days of Exercise per Week: 1 day     Minutes of Exercise per Session: 0 min   Utilities: Not At Risk (10/31/2022)    Received from [facility] Utilities     Threatened with loss of utilities: No   Stress: No Stress Concern Present (06/30/2023)    Received from [facility] - Occupational Stress Questionnaire     Feeling of Stress : Only a little   Interpersonal Safety: Not on file   Substance Use: Not on file (05/06/2023)   Intimate Partner Violence: Not At Risk (09/25/2021)    Received from [facility]    Humiliation, Afraid, Rape, and Kick questionnaire     Fear of Current or Ex-Partner: No     Emotionally Abused: No     Physically Abused: No     Sexually Abused: No   Social Connections: Moderately Integrated (06/30/2023)    Received from [facility]    Social Connection and  Isolation Panel     Frequency of Communication with Friends and Family: Twice a week     Frequency of Social Gatherings with Friends and Family: Twice a week     Attends Religious Services: More than 4 times per year     Active Member of [employer] or Organizations: Yes     Attends Engineer, structural: More than 4 times per year     Marital Status: Never married   Physicist, medical Strain: Medium Risk (06/30/2023)    Received from [facility]    Overall Financial Resource Strain (CARDIA)     Difficulty of Paying Living Expenses: Somewhat hard   Health Literacy: Not on file   Internet Connectivity: Not on file       Would you be willing to receive help with any of the needs that you have identified today? Not applicable       [facility] Specialty Pharmacist         [1]   Current Outpatient Medications   Medication Sig Dispense Refill    ascorbic acid , vitamin C , (VITAMIN C ) 1000 MG tablet Take 1 tablet (1,000 mg total) by mouth.      bimekizumab -bkzx (BIMZELX  AUTOINJECTOR) 320 mg/2 mL AtIn Inject the contents of 1 pen (320 mg) under the skin every fourteen (14) days for 8 doses. LOADING DOSES 16 mL 0    bimekizumab -bkzx (BIMZELX  AUTOINJECTOR) 320 mg/2 mL AtIn Inject the contents of 1 pen (320 mg) under the skin every twenty-eight (28) days. MAINTENANCE DOSES 2 mL 11    brivaracetam (BRIVIACT) 50 mg tablet Take 1 tablet (50 mg total) by mouth.      empty container Misc Use as directed to dispose of Humira  pens. 1 each 2    FELBATOL 600 mg tablet       HYDROcodone -acetaminophen  (NORCO) 5-325 mg per tablet Take 1-2 tablets by mouth every six (6) hours as needed for pain. 15 tablet 0    linezolid  (ZYVOX ) 600 mg tablet Take 1 tablet (600 mg total) by mouth every twelve (12) hours. For 2 weeks. 28 tablet 0    magnesium  oxide (MAG-OX) 400 mg (241.3 mg magnesium ) tablet Take 1 tablet (400 mg total) by mouth daily.      melatonin 5 mg tablet Take 1.5 tablets (7.5 mg total) by mouth.      metFORMIN  (GLUCOPHAGE ) 500 MG tablet Take 1 tablet (500 mg total) by mouth in the morning and 1 tablet (500 mg total) in the evening. Take with meals. 180 tablet 3    pantoprazole  (PROTONIX ) 40 MG tablet 20 mg.      secukinumab  300 mg/2 mL (150 mg/mL) PnIj Loading doses - inject the contents of 1 pen (300mg ) under the skin weekly on week 0, 1, 2, 3, and 4 (5 weeks total) 10 mL 0    secukinumab  300 mg/2 mL PnIj Inject the contents of 1 pen (300 mg) under the skin every twenty-eight (28) days. Maintenance dosing. 2 mL 11    secukinumab  300 mg/2 mL PnIj Inject the contents of 1 pen (300 mg) under the skin every 14 days. Maintenance dose. 4 mL 11    spironolactone  (ALDACTONE ) 100 MG tablet Take 2 tablets (200 mg total) by mouth daily. If you develop lightheadedness, decrease to 1.5 tablets daily 180 tablet 3    tizanidine HCl (TIZANIDINE ORAL) Take by mouth.      topiramate (TOPAMAX) 25 MG tablet Take 2  tablets (50 mg total) by mouth.      tranexamic acid  650 mg Tab tablet Take half of a tablet twice daily 15 tablet 0    turmeric, bulk, 95 % Powd Take 2 capsules by mouth.       No current facility-administered medications for this visit.   [2]   Allergies  Allergen Reactions    Codeine Anaphylaxis    Penicillins Anaphylaxis and Other (See Comments)     Has patient had a PCN reaction causing immediate rash, facial/tongue/throat swelling, SOB or lightheadedness with hypotension: Yes  Has patient had a PCN reaction causing severe rash involving mucus membranes or skin necrosis: No  Has patient had a PCN reaction that required hospitalization: Yes  Has patient had a PCN reaction occurring within the last 10 years: No  If all of the above answers are NO, then may proceed with Cephalosporin use.  Has patient had a PCN reaction causing immediate rash, facial/tongue/throat swelling, SOB or lightheadedness with hypotension: Yes  Has patient had a PCN reaction causing severe rash involving mucus membranes or skin necrosis: No  Has patient had a PCN reaction that required hospitalization: Yes  Has patient had a PCN reaction occurring within the last 10 years: No  If all of the above answers are NO, then may proceed with Cephalosporin use.      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

## 2024-01-03 ENCOUNTER — Ambulatory Visit: Payer: Self-pay

## 2024-03-09 DIAGNOSIS — Z9689 Presence of other specified functional implants: Secondary | ICD-10-CM | POA: Diagnosis not present

## 2024-03-09 DIAGNOSIS — G40909 Epilepsy, unspecified, not intractable, without status epilepticus: Secondary | ICD-10-CM | POA: Diagnosis not present

## 2024-03-09 DIAGNOSIS — G43709 Chronic migraine without aura, not intractable, without status migrainosus: Secondary | ICD-10-CM | POA: Diagnosis not present

## 2024-03-11 IMAGING — US US PELVIS COMPLETE WITH TRANSVAGINAL
1 series · 13 of 25 positions shown · non-contrast
Comparison: CT abdomen and pelvis 10/21/2021

CLINICAL DATA: Ovarian cyst on CT.  LMP 11/14/2021

EXAM:
TRANSABDOMINAL AND TRANSVAGINAL ULTRASOUND OF PELVIS
TECHNIQUE: Both transabdominal and transvaginal ultrasound examinations of the
pelvis were performed. Transabdominal technique was performed for
global imaging of the pelvis including uterus, ovaries, adnexal
regions, and pelvic cul-de-sac. It was necessary to proceed with
endovaginal exam following the transabdominal exam to visualize the
ovaries. Patient was unable to tolerate transvaginal imaging and it
was terminated prematurely.

[Series 1: us pelvis complete with transvaginal · 0.28mm/px · 56 acquisitions, 13 frames shown]
[im 1/56]
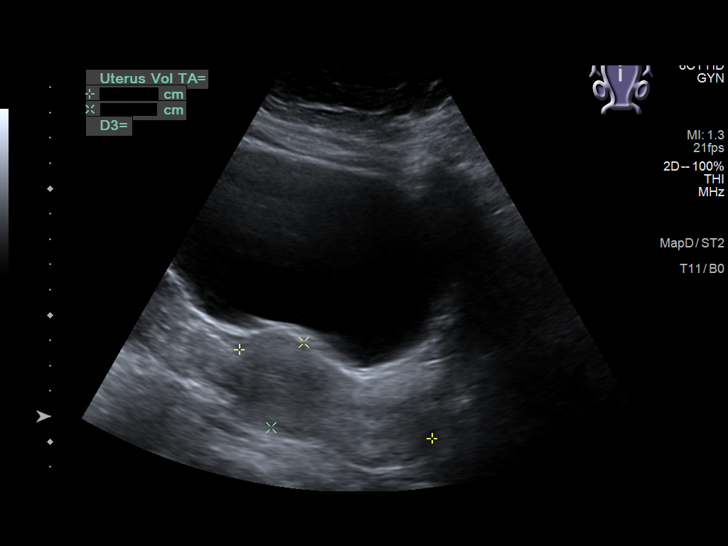
[im 5/56]
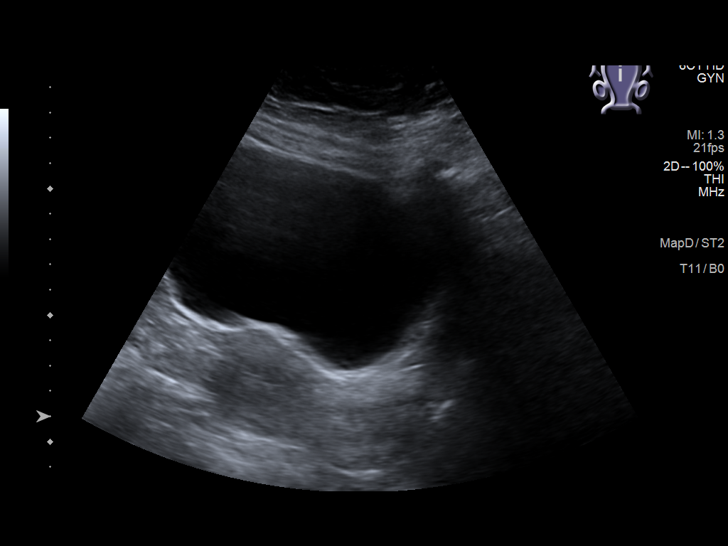
[im 10/56]
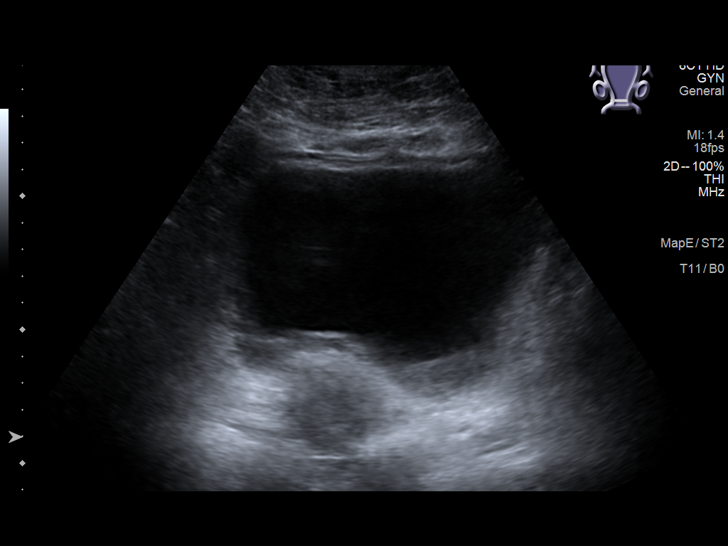
[im 14/56]
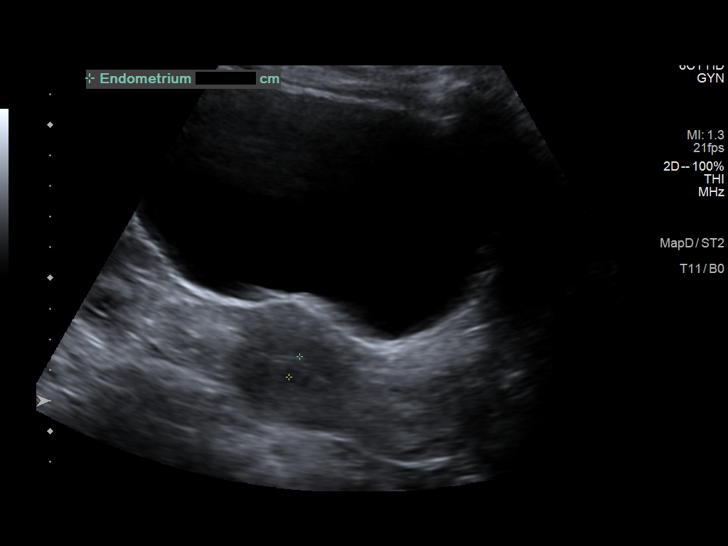
[im 19/56]
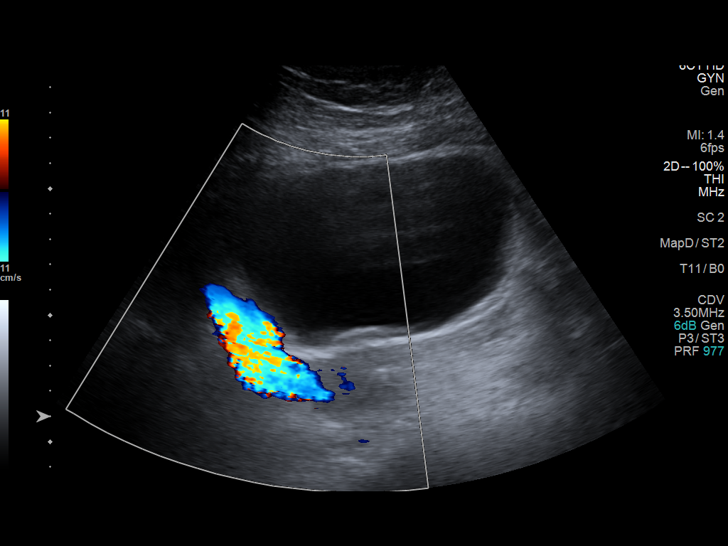
[im 23/56]
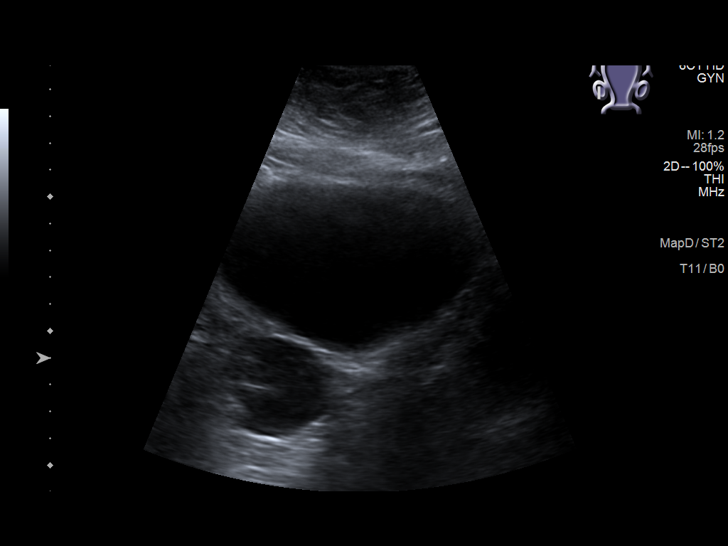
[im 28/56]
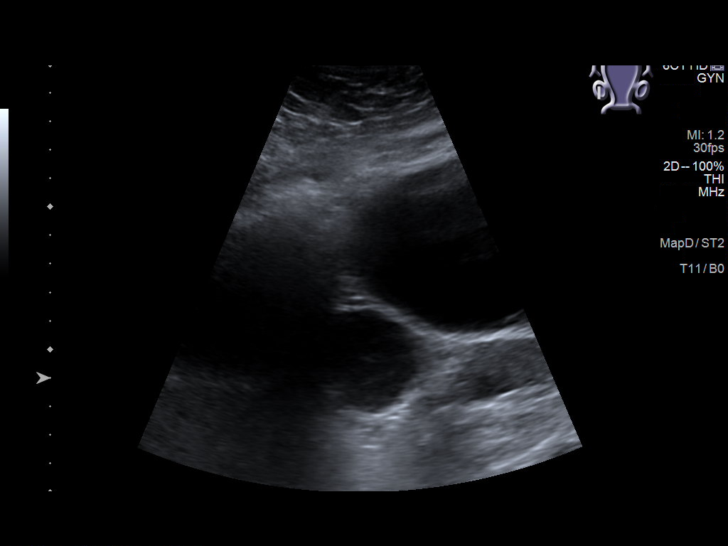
[im 33/56]
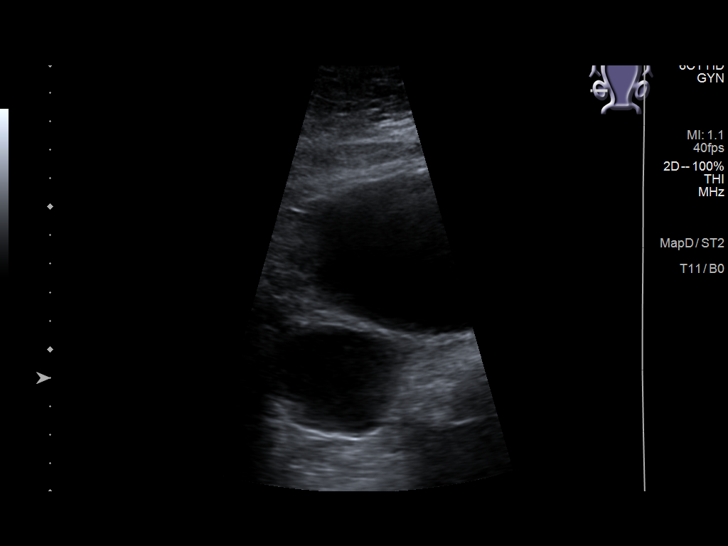
[im 37/56]
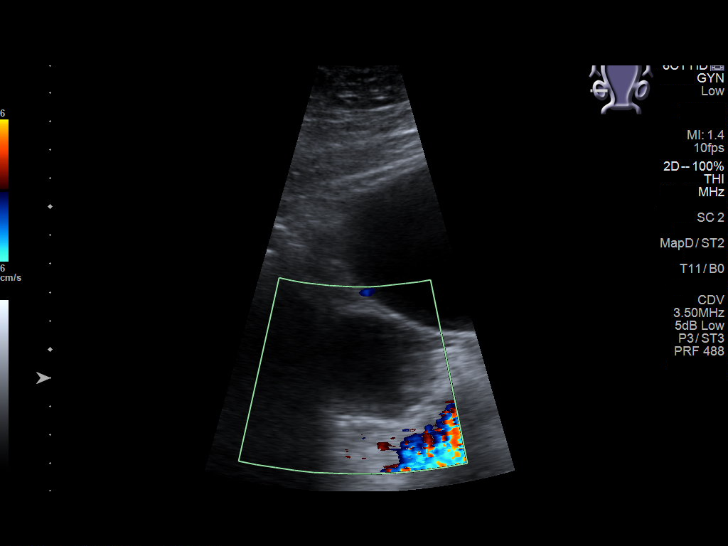
[im 42/56]
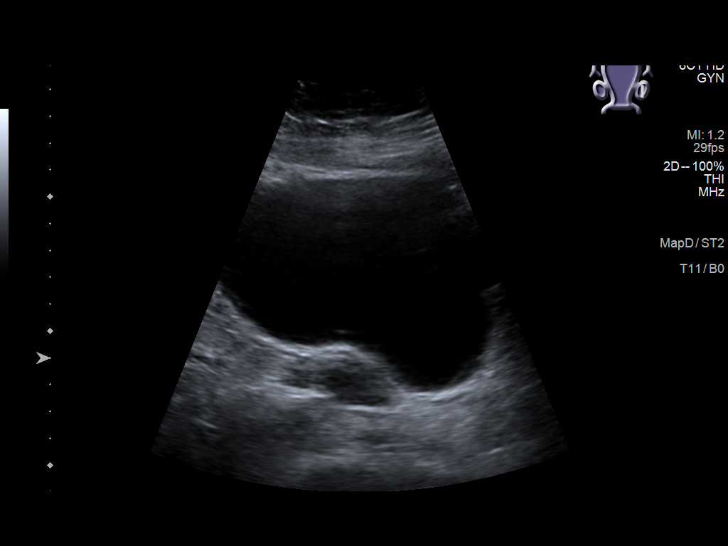
[im 46/56]
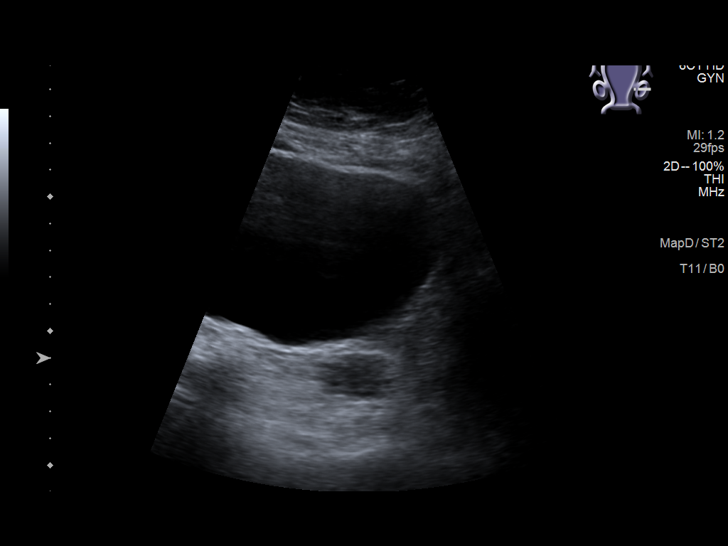
[im 51/56]
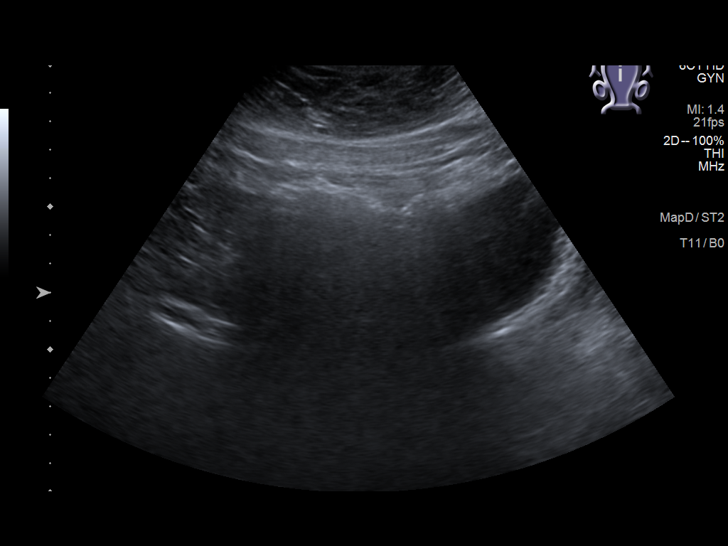
[im 56/56]
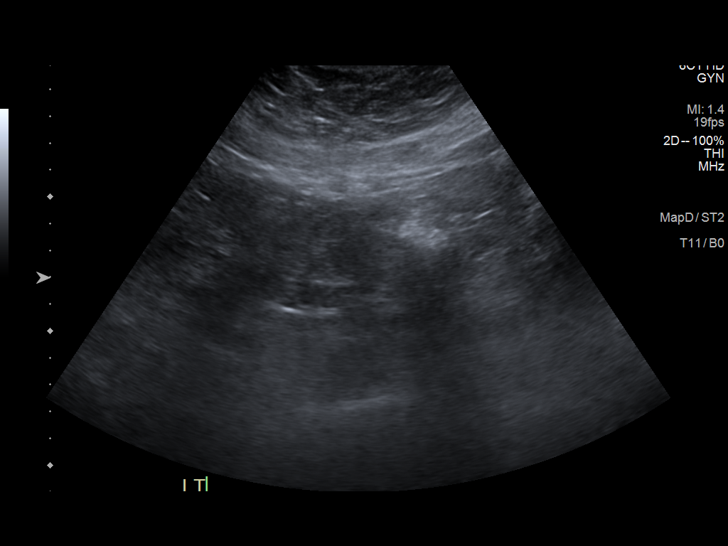

[13 of 25 positions shown; findings below may reference images not displayed]

FINDINGS: Uterus

Measurements: 8.4 x 3.6 x 4.5 cm = volume: 71 mL. Grossly anteverted
on transabdominal imaging. No definite mass.

Endometrium

Thickness: 8 mm.  No endometrial fluid or mass

Right ovary

Measurements: 4.0 x 1.9 x 2.8 cm = volume: 10.8 mL. Cyst within
RIGHT ovary, 3.6 x 3.7 x 4.6 cm in size containing low level
internal echoes diffusely. This does not represent a simple cyst by
transabdominal imaging and is not imaged by transvaginal imaging. No
gross mural nodule or septation.

Left ovary

Measurements: 4.0 x 1.9 x 2.5 cm = volume: 9.6 mL. Normal morphology
without mass

Other findings

No free pelvic fluid or other adnexal masses.
IMPRESSION: Suboptimal exam due to limited transvaginal imaging.

Grossly unremarkable uterus, endometrial complex and LEFT ovary.

Incompletely evaluated cyst within RIGHT ovary, 4.6 cm diameter
containing scattered low level internal echoes without gross
evidence of mural nodularity or septations.

Since this could not be further characterized by transvaginal
imaging, either follow-up transvaginal imaging in 6-12 weeks to
characterize and assess stability or further characterization by MR
imaging recommended to determine character and significance.

## 2024-03-16 DIAGNOSIS — L732 Hidradenitis suppurativa: Principal | ICD-10-CM

## 2024-03-16 MED ORDER — BIMZELX AUTOINJECTOR 320 MG/2 ML SUBCUTANEOUS AUTO-INJECTOR
SUBCUTANEOUS | 0 refills | 112.00000 days | Status: CN
Start: 2024-03-16 — End: 2024-06-23

## 2024-03-16 NOTE — Unmapped (Addendum)
 Learning About Hidradenitis Suppurativa  What is hidradenitis suppurativa?     Hidradenitis suppurativa (say hih-drad-uh-NY-tus sup-yur-uh-TY-vuh) is a skin condition that causes lumps on the skin. The lumps look like pimples or boils. They usually occur in areas where skin rubs against skin, such as the armpit, the buttocks, or the inner thighs. The condition can come and go for many years.  What are the symptoms?  Red lumps that may look like pimples, acne, or boils appear on the skin and are usually painful. The lumps:  Usually occur in areas where skin rubs against skin, such as in the armpit. They can also appear under the breasts, in the groin area, on the buttocks, around the anus, and on the inner thighs.  May go away on their own in a few weeks. But they often come back in the same area.  Can become infected and break open, draining blood and pus that usually smells bad.  If the condition isn't treated and gets worse, hollow tunnels can form under the skin. Over time, the infection and tunnels will heal, but a thick scar may form. These scars can keep skin from stretching naturally.  How is hidradenitis suppurativa treated?  The treatment for hidradenitis suppurativa depends on how serious it is. Your doctor may discuss options, such as:  Medicines. You may need to take pills, such as antibiotics, or rub a prescription ointment or cream on the affected skin.  Corticosteroid injections (shots) into the affected areas.  Hormone pills. Taking birth control pills or other medicines that affect hormones may help.  Removing infected tissue.  How can you care for yourself at home?  Skin care    Wash the area every day with mild soap. Use your hands rather than a washcloth or sponge when you wash that part of your body.     Leave the affected areas uncovered when you can. If you have lumps that are draining, you can cover them with a bandage or other dressing. Put petroleum jelly (such as Vaseline) on the dressing to help keep it from sticking.     Wear-loose fitting clothes that don't rub against the area. Avoid activities that cause skin to rub together.     If you have pain, try a warm compress. Soak a towel or washcloth in warm water, wring it out, and place it on the affected skin for about 10 minutes.   Medicines    Be safe with medicines. Take your medicines exactly as prescribed. Call your doctor if you think you are having a problem with your medicine. You will get more details on the specific medicines your doctor prescribes.     If your doctor prescribed antibiotics, take them as directed. Do not stop taking them just because you feel better. You need to take the full course of antibiotics.   Lifestyle choices    If you smoke, think about quitting. Smoking can make the condition worse. If you need help quitting, talk to your doctor about stop-smoking programs and medicines. These can increase your chances of quitting for good.     Stay at a healthy weight, or lose weight, by eating healthy foods and being physically active. Being overweight could make this condition worse.   Follow-up care is a key part of your treatment and safety. Be sure to make and go to all appointments, and call your doctor if you are having problems. It's also a good idea to know your test results and keep a  list of the medicines you take.  Where can you learn more?  Go to [facility] at [email or web address].Armed forces logistics/support/administrative officer in the Menu. Enter J430 in the search box to learn more about Learning About Hidradenitis Suppurativa.  Current as of: June 05, 2023  Content Version: 14.5  ?? 2024-2025 [place], [place].   Care instructions adapted under license by [facility]. If you have questions about a medical condition or this instruction, always ask your healthcare professional. Romayne Alderman, [facility], disclaims any warranty or liability for your use of this information.           Meet your team:     Your intake nurse is: Josehua Hammar     Please remember to fill out the survey you will receive after your visit. Your comments help us  continue to improve our care.      Thanks in advance!      [facility] Dermatology Clinical Staff

## 2024-03-16 NOTE — Unmapped (Signed)
 Dermatology Note    Assessment and Plan:      Hidradenitis Suppurativa (HS): Hurley Stage 2, chronic, continues to flare around menses, not at patient goal  - S/p unroofing procedure in the L perianal area, R perianal area, and L labia majora (partial labial removal) on 12/04/2022  - previous treatments: Humira  (helpful but difficulty maintaining insurance coverage), Cosentyx  inadequate response  - We discussed the typical natural history, pathogenesis, treatment options, and expected course as well as the relapsing and sometimes recalcitrant nature of the disease.    - Patient is unable to take rifampin  because it interacts with her seizure medications (briviact and felbatol)  - She is flaring even when a few days late for q2 week loading so we'll plan to continue q2 week for maintenance.    - Continue metformin  500 mg BID. Side effects discussed. Refilled today  - Continue spironolactone  (ALDACTONE ) 100 MG tablet; Take 2 tablets (200 mg total) by mouth daily.   - Surgery planned for L inguinal and buttocks crease about 2cm for each of 2 sites.    High risk medication use (Cosentyx )  - Negative Quant gold obtained 07/03/2022  - Adalimumab /Adalimumab  Ab slightly low 07/03/2022 so was unable to increase Humira  dosage      The patient was advised to call for an appointment should any new, changing, or symptomatic lesions develop.     RTC: No follow-ups on file. or sooner as needed   _________________________________________________________________      Chief Complaint     Chief Complaint   Patient presents with    Hidradenitis suppurativa     Pt coming in for a follow up visit for Bimzelx  medication          HPI     Erica Norman is a 43 y.o. female who presents as a returning patient (last seen by Dr. Paulita on 12/04/2023) to Dermatology for follow up of hidradenitis suppurativa. At last visit patient was on Cosentyx  at every 2 weeks maintenance with inadequate response and changed to Bimzelx  after. She has been doing this for about 3 months and thinks it is helping overall, but she notices some flares when she gets a dose a few days late during loading at q2 weeks. She is very nervous to spread to every 4 weeks for maintenance in the next month.    The patient denies any other new or changing lesions or areas of concern.     Pertinent Past Medical History     Pulled from prior notes:    HS Disease course:  Year when symptoms first noticed: Age 23   Year of diagnosis: Age 1  Who diagnosed you? Dermatologist  Location of first symptoms: axillae  Typical involved areas include: groin  Typical number of inflammatory lesions each month at baseline (from first visit): three to five  Disease triggers: stress, sweat and menstrual cycle     Are menstrual cycles irregular when not on birth control? No.  Current form of contraception: None  Effect of hormonal contraception on disease: n/a  Flaring with menstrual cycle (before, during, or after?): during  Difficulty becoming pregnant? n/a  Pregnancy complications? never pregnant    How many children? 0  Better or worse with pregnancy?  never pregnant.  If so, worse during a specific trimester? never pregnant       Prior treatments:  Topical: Clobetasol   Systemic: Keflex , Doxycyline, Humira  titrated up to 80mg  weekly, but stopped in summer 2024 due to insurance issues, Clindamycin   Past surgical procedures: Yes -bilateral wide excisions of the axilla healed well since 2008 or 2009.  She had more recent excisions in the bilateral inguinal folds in 2016-2018.  These have mostly healed well but she has some persistent areas in particular along the infra abdominal fold and one in the right inguinal fold  Past laser procedures: No     Past Medical History, Family History, Social History, Medication List, Allergies, and Problem List were reviewed in the rooming section of Epic.     ROS: Other than symptoms mentioned in the HPI, no fevers, chills, or other skin complaints    Physical Examination Gen: Well-appearing patient, appropriate, interactive, in no acute distress  SKIN (Focal Skin Exam): Per patient request, examination of bilateral axillae, chest, abdomen, bilateral thighs, groin, buttocks, and external genitalia was performed    location Abscess Inflamed nodule Non-inflamed nodule Draining sinus Non-draining Sinus Hurley % scar   R axilla          L axilla          R inframammary          L inframammary          Intermammary          Pubic          R inguinal     1     R thigh          L inguinal  2  1 1      L thigh  1        Scrotum/Vulva          Perianal          R buttock          L buttock  1        Other (list)                      AN count (total sum of abscess and inflammatory nodule): 4  Pilonidal sinus? No

## 2024-03-16 NOTE — Unmapped (Signed)
 Self-reported severity (0-5): 2  VAS pain today: 2  VAS average pain for the last month: -  Requiring pain medication? Yes.  If so, what type/frequency? tylenol   How often in pain?  -  Level of odor (0-5): 2  Level of itching (0-5): 3  Dressing changes needed for drainage:Once a day  How much drainage: no drainage  Flare in the last month (Y/N)? No.  How long ago was the last flare? in the last year  Developing new lesions? once a month  Number of inflammatory lesions montly: -  DLQI: 10  Current treatment: Bimzelx      How helpful is the current treatment in managing the following aspects of your disease?  Not at all helpful Somewhat helpful Very helpful   Pain   x   Decreasing length of flares   x   Decreasing new lesions  x    Drainage  x    Decreasing frequency of flares   x   Decreasing severity of flares   x   Odor x

## 2024-03-17 NOTE — Unmapped (Addendum)
 Erica Norman is doing very well on Bimzelx  - she reports better results than with prior therapies. Unfortunately she is still having some flares right before the next dose and thus her provider would like to keep going with every 2 week dosing beyond the standard loading ending at week 16 (appears insurance is on board and confirmed plan with provider on 9/16).    [facility] Clinical Assessment & Refill Coordination Note    Stefanie Hodgens, DOB: 28-Apr-1981  Phone: [phone] (home)     All above HIPAA information was verified with patient.     Was a Nurse, learning disability used for this call? No    Specialty Medication(s):   Inflammatory Disorders: Bimzelx      Current Medications[1]     Changes to medications: Dilan reports no changes at this time.    Medication list has been reviewed and updated in Epic: Yes    Allergies[2]    Changes to allergies: No    Allergies have been reviewed and updated in Epic: Yes    SPECIALTY MEDICATION ADHERENCE     Bimzelx  320 mg: 0 doses of medicine on hand   Medication Adherence    Patient reported X missed doses in the last month: 0  Specialty Medication: Bimzelx           Specialty medication(s) dose(s) confirmed: Regimen is correct and unchanged.     Are there any concerns with adherence? No    Adherence counseling provided? Not needed    CLINICAL MANAGEMENT AND INTERVENTION      Clinical Benefit Assessment:    Do you feel the medicine is effective or helping your condition? Yes    Clinical Benefit counseling provided? Not needed    Adverse Effects Assessment:    Are you experiencing any side effects? No    Are you experiencing difficulty administering your medicine? No    Quality of Life Assessment:    Quality of Life    Rheumatology  Oncology  Dermatology  1. What impact has your specialty medication had on the symptoms of your skin condition (i.e. itchiness, soreness, stinging)?: Some  2. What impact has your specialty medication had on your comfort level with your skin?: Some  Cystic Fibrosis          How many days over the past month did your HS  keep you from your normal activities? For example, brushing your teeth or getting up in the morning. Patient declined to answer    Have you discussed this with your provider? Not needed    Acute Infection Status:    Acute infections noted within Epic:  No active infections    Patient reported infection: None    Therapy Appropriateness:    Is therapy appropriate based on current medication list, adverse reactions, adherence, clinical benefit and progress toward achieving therapeutic goals? Yes, therapy is appropriate and should be continued     Clinical Intervention:    Was an intervention completed as part of this clinical assessment? No    DISEASE/MEDICATION-SPECIFIC INFORMATION      For patients on injectable medications: Next injection is scheduled for 9/30.    Chronic Inflammatory Diseases: Have you experienced any flares in the last month? Yes, some flaring prior to next dose  Has this been reported to your provider? Yes, provider aware - will plan for increased/continued loading dosing of every 2 weeks with this fill    PATIENT SPECIFIC NEEDS     Does the patient have any physical,  cognitive, or cultural barriers? No    Is the patient high risk? No    Does the patient require physician intervention or other additional services (i.e., nutrition, smoking cessation, social work)? No    Does the patient have an additional or emergency contact listed in their chart? Yes    SOCIAL DETERMINANTS OF HEALTH     At the [facility], we have learned that life circumstances - like trouble affording food, housing, utilities, or transportation can affect the health of many of our patients.   That is why we wanted to ask: are you currently experiencing any life circumstances that are negatively impacting your health and/or quality of life? Patient declined to answer    Social Drivers of Health     Food Insecurity: Food Insecurity Present (06/30/2023)    Received from [facility]    Hunger Vital Sign     Within the past 12 months, you worried that your food would run out before you got the money to buy more.: Sometimes true     Within the past 12 months, the food you bought just didn't last and you didn't have money to get more.: Sometimes true   Tobacco Use: Low Risk  (12/10/2023)    Received from [facility]    Patient History     Passive Exposure: Not on file     Smokeless Tobacco Use: Never     Smoking Tobacco Use: Never   Transportation Needs: No Transportation Needs (06/30/2023)    Received from [facility] - Transportation     Lack of Transportation (Medical): No     Lack of Transportation (Non-Medical): No   Alcohol Use: Not on file   Housing: Not on file   Physical Activity: Inactive (06/30/2023)    Received from [facility]    Exercise Vital Sign     On average, how many days per week do you engage in moderate to strenuous exercise (like a brisk walk)?: 1 day     On average, how many minutes do you engage in exercise at this level?: 0 min   Utilities: Not At Risk (10/31/2022)    Received from [facility] Utilities     Threatened with loss of utilities: No   Stress: No Stress Concern Present (06/30/2023)    Received from [facility] - Occupational Stress Questionnaire     Feeling of Stress : Only a little   Interpersonal Safety: Not on file   Substance Use: Not on file (05/06/2023)   Intimate Partner Violence: Not At Risk (09/25/2021)    Received from [facility]    Humiliation, Afraid, Rape, and Kick questionnaire     Within the last year, have you been afraid of your partner or ex-partner?: No     Within the last year, have you been humiliated or emotionally abused in other ways by your partner or ex-partner?: No     Within the last year, have you been kicked, hit, slapped, or otherwise physically hurt by your partner or ex-partner?: No     Within the last year, have you been raped or forced to have any kind of sexual activity by your partner or ex-partner?: No   Social Connections: Moderately Integrated (06/30/2023)    Received from [facility]    Social Connection and Isolation Panel     In a typical week, how many times do you talk  on the phone with family, friends, or neighbors?: Twice a week     How often do you get together with friends or relatives?: Twice a week     How often do you attend church or religious services?: More than 4 times per year     Do you belong to any clubs or organizations such as church groups, unions, fraternal or athletic groups, or school groups?: Yes     How often do you attend meetings of the clubs or organizations you belong to?: More than 4 times per year     Are you married, widowed, divorced, separated, never married, or living with a partner?: Never married   Physicist, medical Strain: Medium Risk (06/30/2023)    Received from [facility]    Overall Financial Resource Strain (CARDIA)     Difficulty of Paying Living Expenses: Somewhat hard   Health Literacy: Not on file   Internet Connectivity: Not on file       Would you be willing to receive help with any of the needs that you have identified today? Not applicable       SHIPPING     Specialty Medication(s) to be Shipped:   Inflammatory Disorders: Bimzelx     Other medication(s) to be shipped: No additional medications requested for fill at this time    Specialty Medications not needed at this time: N/A     Changes to insurance: No    Cost and Payment: Patient has a copay of $4. They are aware and have authorized the pharmacy to charge the credit card on file.    Delivery Scheduled: Yes, Expected medication delivery date: 9/23.     Medication will be delivered via Same Day Courier to the confirmed prescription address in [employer] [place] [employer].    The patient will receive a drug information handout for each medication shipped and additional FDA Medication Guides as required.  Verified that patient has previously received a Conservation officer, historic buildings and a Surveyor, mining.    The patient or caregiver noted above participated in the development of this care plan and knows that they can request review of or adjustments to the care plan at any time.      All of the patient's questions and concerns have been addressed.    [facility] Specialty Pharmacist         [1]   Current Outpatient Medications   Medication Sig Dispense Refill    ascorbic acid , vitamin C , (VITAMIN C ) 1000 MG tablet Take 1 tablet (1,000 mg total) by mouth.      bimekizumab -bkzx (BIMZELX  AUTOINJECTOR) 320 mg/2 mL AtIn Inject the contents of 1 pen (320 mg) under the skin every twenty-eight (28) days. MAINTENANCE DOSES 2 mL 11    bimekizumab -bkzx (BIMZELX  AUTOINJECTOR) 320 mg/2 mL AtIn Inject the contents of 1 auto-injector (320 mg) under the skin every 2 weeks for maintenance 4 mL 11    brivaracetam (BRIVIACT) 50 mg tablet Take 1 tablet (50 mg total) by mouth.      empty container Misc Use as directed to dispose of Humira  pens. 1 each 2    FELBATOL 600 mg tablet       HYDROcodone -acetaminophen  (NORCO) 5-325 mg per tablet Take 1-2 tablets by mouth every six (6) hours as needed for pain. 15 tablet 0    linezolid  (ZYVOX ) 600 mg tablet Take 1 tablet (600 mg total) by mouth every twelve (12) hours. For 2 weeks.  28 tablet 0    magnesium  oxide (MAG-OX) 400 mg (241.3 mg magnesium ) tablet Take 1 tablet (400 mg total) by mouth daily.      melatonin 5 mg tablet Take 1.5 tablets (7.5 mg total) by mouth.      metFORMIN  (GLUCOPHAGE ) 500 MG tablet Take 1 tablet (500 mg total) by mouth in the morning and 1 tablet (500 mg total) in the evening. Take with meals. 180 tablet 3    pantoprazole  (PROTONIX ) 40 MG tablet 20 mg.      spironolactone  (ALDACTONE ) 100 MG tablet Take 2 tablets (200 mg total) by mouth daily. If you develop lightheadedness, decrease to 1.5 tablets daily 180 tablet 3    tizanidine HCl (TIZANIDINE ORAL) Take by mouth.      topiramate (TOPAMAX) 25 MG tablet Take 2 tablets (50 mg total) by mouth.      tranexamic acid  650 mg Tab tablet Take half of a tablet twice daily 15 tablet 0    turmeric, bulk, 95 % Powd Take 2 capsules by mouth.       No current facility-administered medications for this visit.   [2]   Allergies  Allergen Reactions    Codeine Anaphylaxis    Penicillins Anaphylaxis and Other (See Comments)     Has patient had a PCN reaction causing immediate rash, facial/tongue/throat swelling, SOB or lightheadedness with hypotension: Yes  Has patient had a PCN reaction causing severe rash involving mucus membranes or skin necrosis: No  Has patient had a PCN reaction that required hospitalization: Yes  Has patient had a PCN reaction occurring within the last 10 years: No  If all of the above answers are NO, then may proceed with Cephalosporin use.  Has patient had a PCN reaction causing immediate rash, facial/tongue/throat swelling, SOB or lightheadedness with hypotension: Yes  Has patient had a PCN reaction causing severe rash involving mucus membranes or skin necrosis: No  Has patient had a PCN reaction that required hospitalization: Yes  Has patient had a PCN reaction occurring within the last 10 years: No  If all of the above answers are NO, then may proceed with Cephalosporin use.      Sulfa (Sulfonamide Antibiotics) Hives    Adhesive Tape-Silicones Other (See Comments)     Large Boils-PAPER TAPE OK TO USE

## 2024-03-19 IMAGING — CR DG ANKLE COMPLETE 3+V*R*
3 series · 3 of 3 positions shown · non-contrast
Comparison: 09/28/2021 radiographs

CLINICAL DATA: Acute RIGHT ankle pain and swelling following fall.
Initial encounter.

EXAM:
RIGHT ANKLE - COMPLETE 3+ VIEW

[ankle ap]
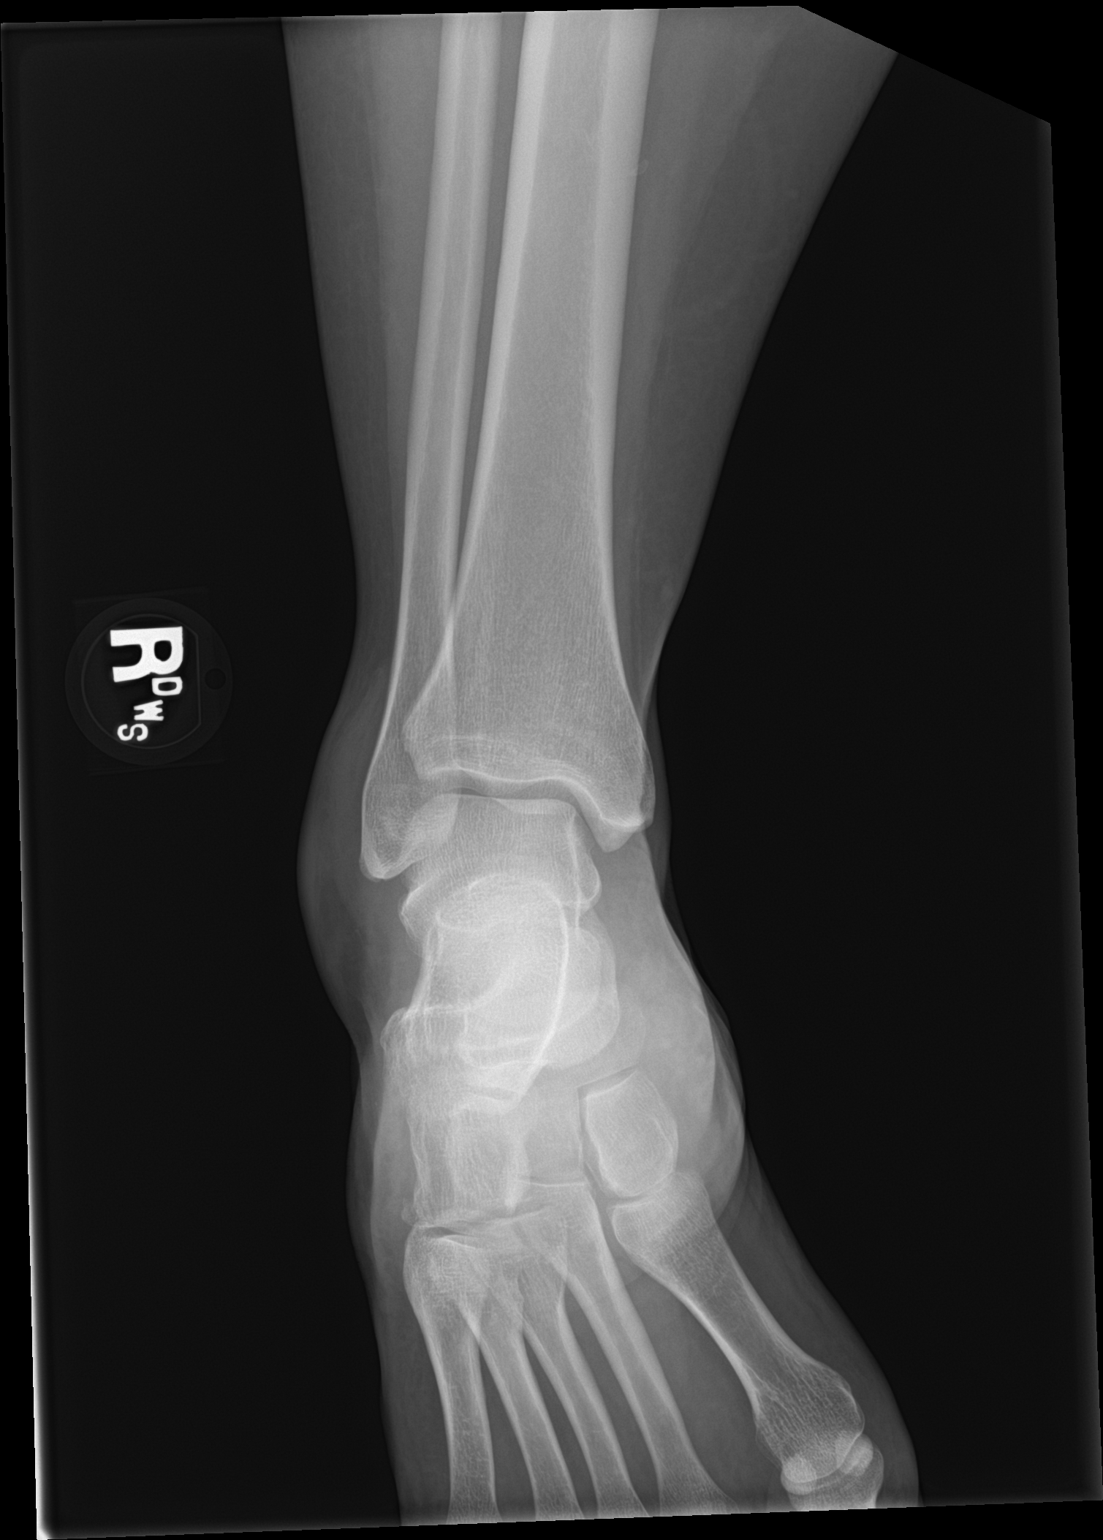

[ankle obl]
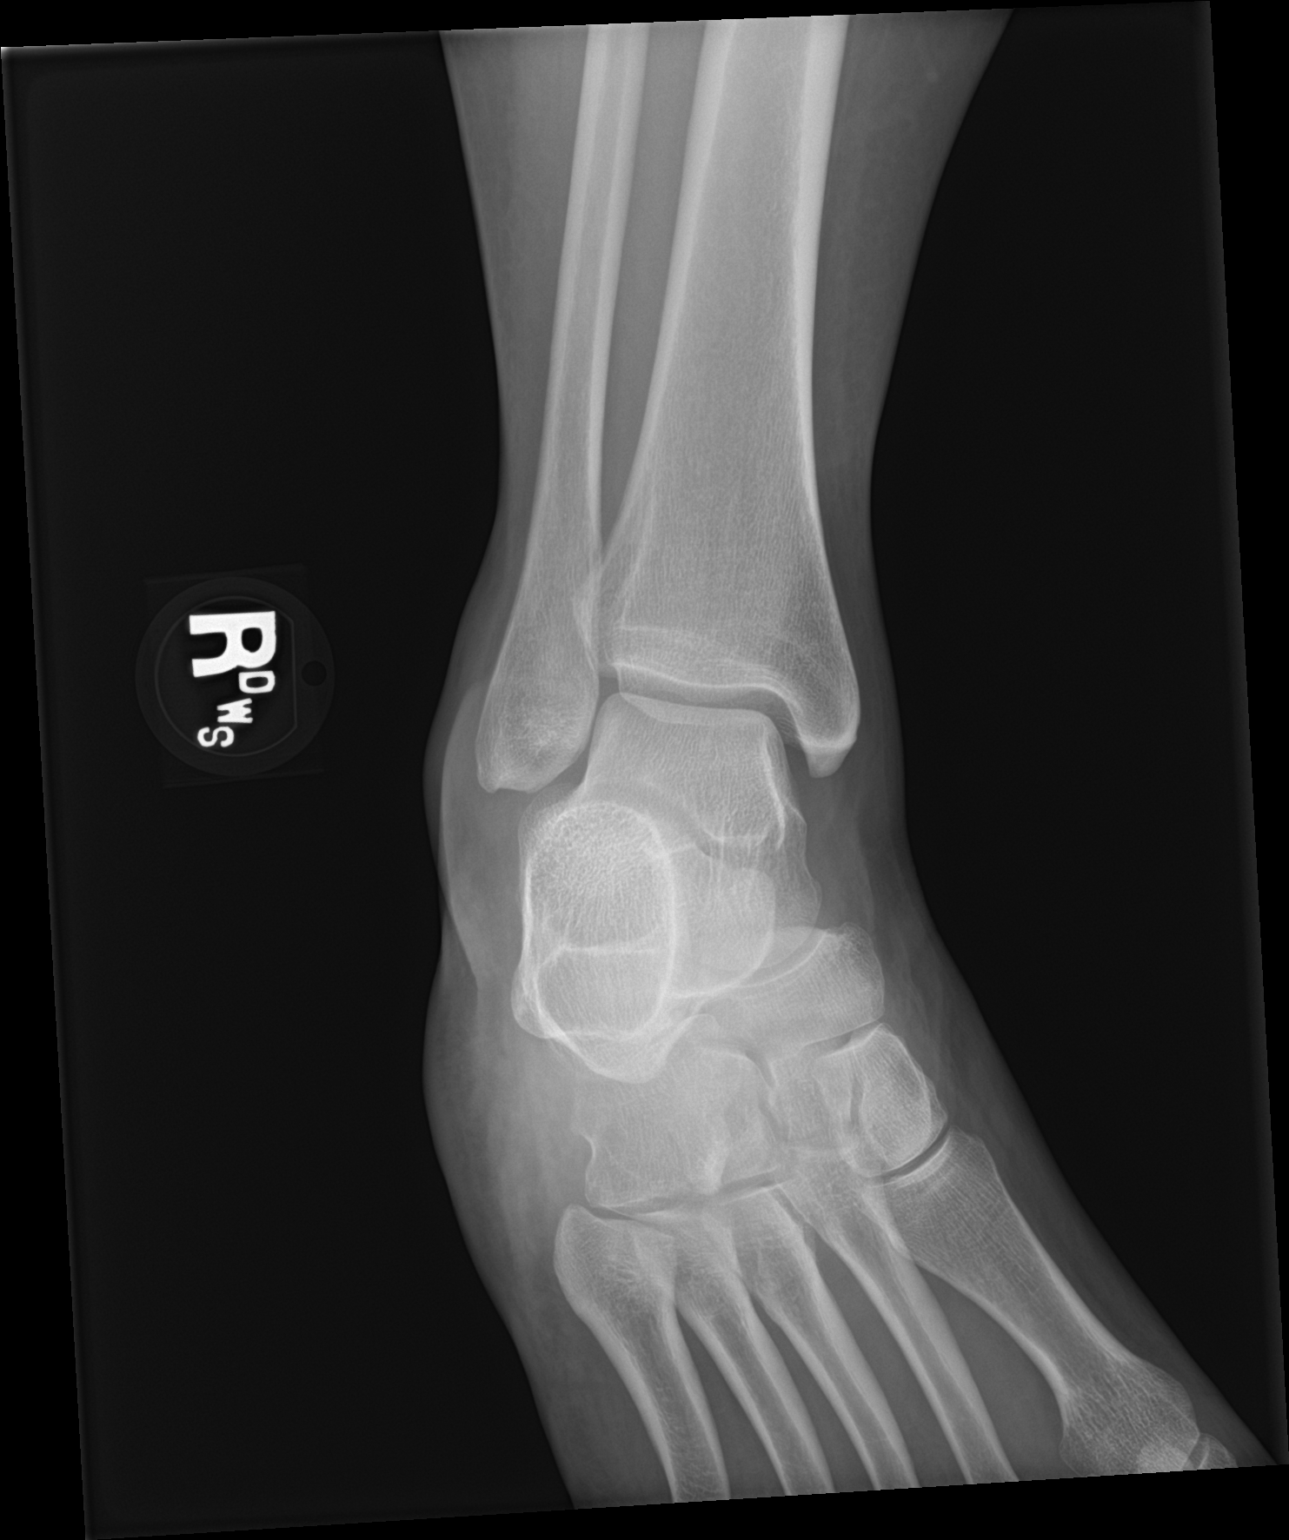

[ankle lat]
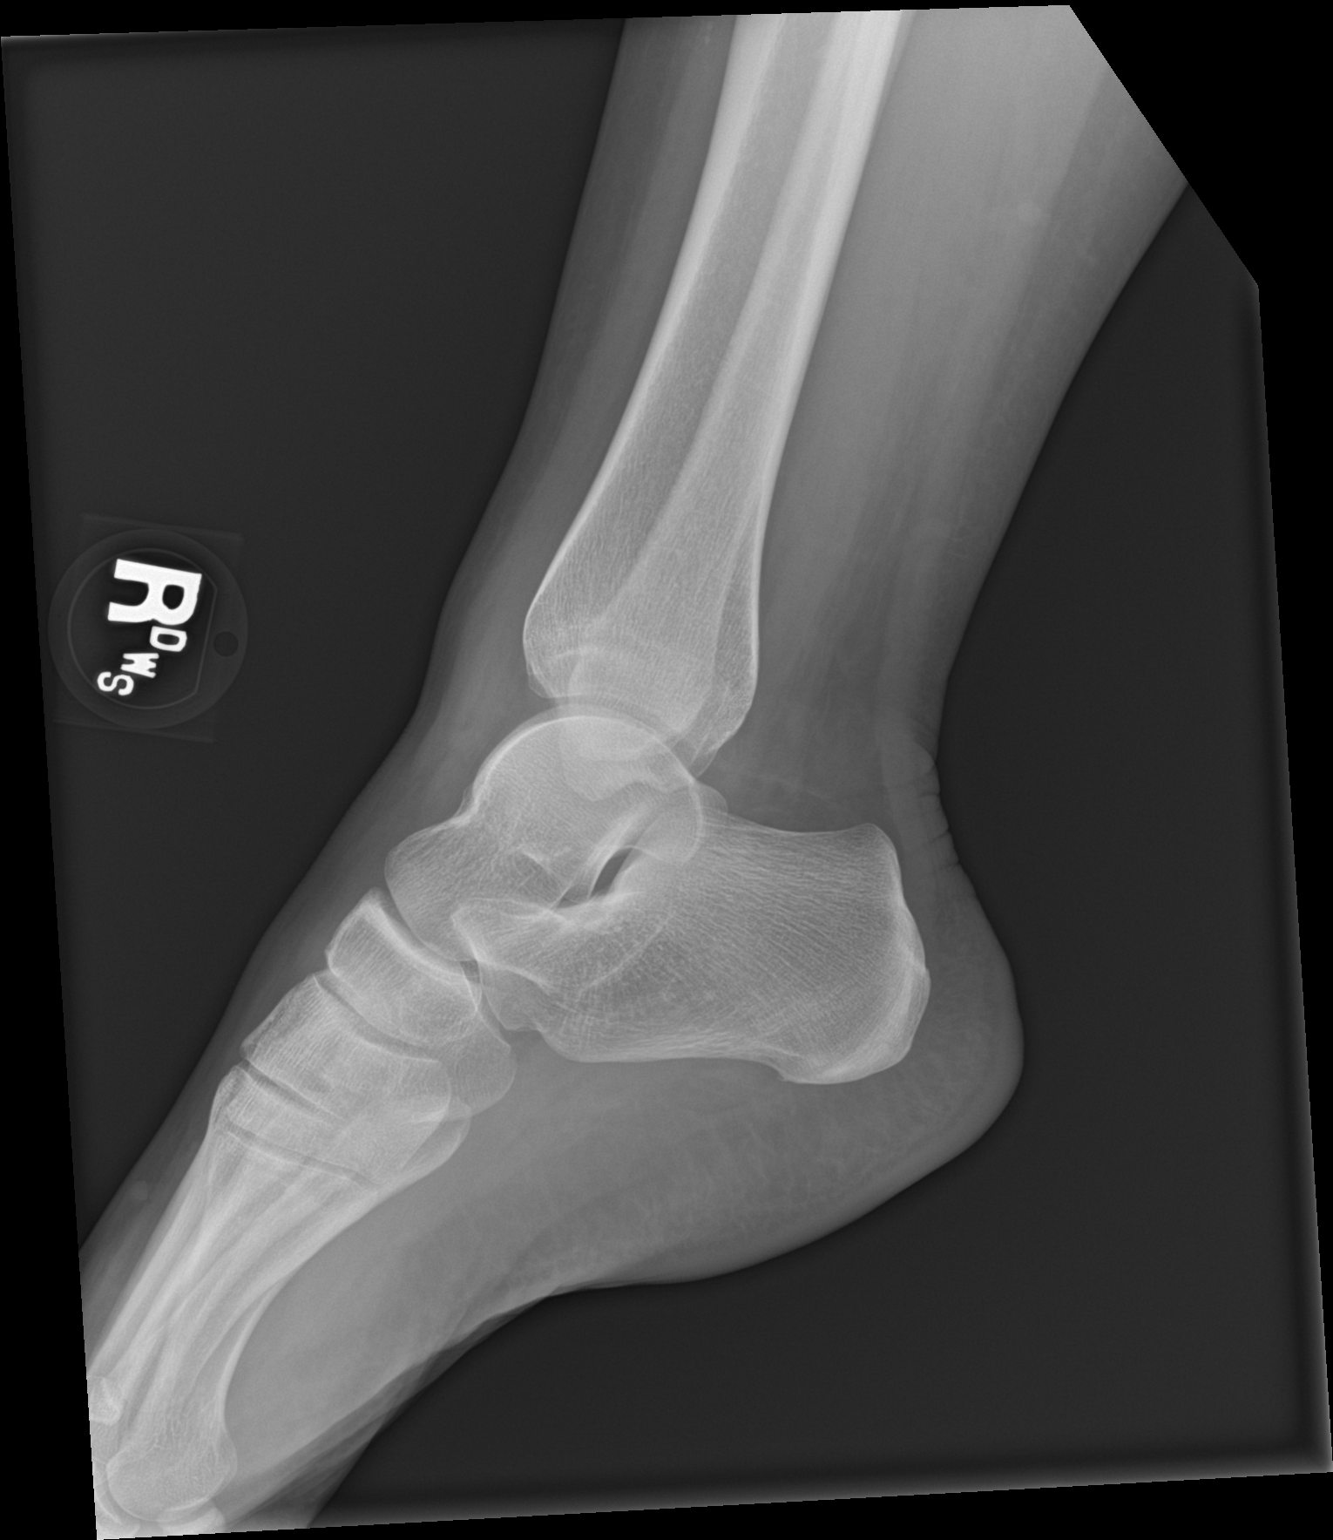

[3 of 3 positions shown; findings below may reference images not displayed]

FINDINGS: No acute fracture, subluxation or dislocation identified.

Anterior and lateral soft tissue swelling is noted.

No focal bony lesions are present.

The joint spaces are unremarkable.
IMPRESSION: Soft tissue swelling without acute bony abnormality.

## 2024-03-24 MED FILL — BIMZELX AUTOINJECTOR 320 MG/2 ML SUBCUTANEOUS AUTO-INJECTOR: SUBCUTANEOUS | 84 days supply | Qty: 12 | Fill #0

## 2024-04-06 ENCOUNTER — Ambulatory Visit

## 2024-04-07 ENCOUNTER — Ambulatory Visit: Admitting: Internal Medicine

## 2024-04-07 ENCOUNTER — Other Ambulatory Visit: Payer: Self-pay

## 2024-04-07 ENCOUNTER — Other Ambulatory Visit (HOSPITAL_COMMUNITY)
Admission: RE | Admit: 2024-04-07 | Discharge: 2024-04-07 | Disposition: A | Discharge status: Home / Self Care | Source: Ambulatory Visit | Attending: Internal Medicine | Admitting: Internal Medicine

## 2024-04-07 VITALS — BP 122/70 | HR 97 | Temp 98.0°F | Resp 16 | Ht 64.0 in | Wt 203.7 lb

## 2024-04-07 DIAGNOSIS — R10A1 Flank pain, right side: Secondary | ICD-10-CM | POA: Insufficient documentation

## 2024-04-07 DIAGNOSIS — E559 Vitamin D deficiency, unspecified: Secondary | ICD-10-CM | POA: Diagnosis not present

## 2024-04-07 DIAGNOSIS — N926 Irregular menstruation, unspecified: Secondary | ICD-10-CM

## 2024-04-07 DIAGNOSIS — R102 Pelvic and perineal pain unspecified side: Secondary | ICD-10-CM

## 2024-04-07 DIAGNOSIS — L659 Nonscarring hair loss, unspecified: Secondary | ICD-10-CM

## 2024-04-07 LAB — CBC WITH DIFFERENTIAL/PLATELET
Absolute Lymphocytes: 3223 {cells}/uL (ref 850–3900)
Absolute Monocytes: 923 {cells}/uL (ref 200–950)
Basophils Absolute: 99 {cells}/uL (ref 0–200)
Basophils Relative: 0.7 %
Eosinophils Absolute: 426 {cells}/uL (ref 15–500)
Eosinophils Relative: 3 %
HCT: 41.7 % (ref 35.0–45.0)
Hemoglobin: 14 g/dL (ref 11.7–15.5)
MCH: 31.5 pg (ref 27.0–33.0)
MCHC: 33.6 g/dL (ref 32.0–36.0)
MCV: 93.9 fL (ref 80.0–100.0)
MPV: 9.1 fL (ref 7.5–12.5)
Monocytes Relative: 6.5 %
Neutro Abs: 9528 {cells}/uL — ABNORMAL HIGH (ref 1500–7800)
Neutrophils Relative %: 67.1 %
Platelets: 614 Thousand/uL — ABNORMAL HIGH (ref 140–400)
RBC: 4.44 Million/uL (ref 3.80–5.10)
RDW: 12.8 % (ref 11.0–15.0)
Total Lymphocyte: 22.7 %
WBC: 14.2 Thousand/uL — ABNORMAL HIGH (ref 3.8–10.8)

## 2024-04-07 LAB — POCT URINALYSIS DIPSTICK
Bilirubin, UA: NEGATIVE
Glucose, UA: NEGATIVE
Ketones, UA: NEGATIVE
Leukocytes, UA: NEGATIVE
Nitrite, UA: NEGATIVE
Protein, UA: NEGATIVE
Spec Grav, UA: 1.02 (ref 1.010–1.025)
Urobilinogen, UA: 0.2 U/dL
pH, UA: 5 (ref 5.0–8.0)

## 2024-04-07 LAB — VITAMIN D 25 HYDROXY (VIT D DEFICIENCY, FRACTURES): Vit D, 25-Hydroxy: 31 ng/mL (ref 30–100)

## 2024-04-07 LAB — TSH: TSH: 1.96 m[IU]/L

## 2024-04-07 NOTE — Progress Notes (Signed)
 Acute Office Visit  Subjective:     Patient ID: Destiny Flores, female    DOB: 03/31/1981, 43 y.o.   MRN: [id number]  Chief Complaint  Patient presents with   Menstrual Problem    Right side pain     HPI Patient is in today for right flank pain.  Discussed the use of AI scribe software for clinical note transcription with the patient, who gave verbal consent to proceed.  History of Present Illness Loyalty Arentz is a 43 year old female who presents with irregular menstrual bleeding and abdominal pain.  She experiences irregular menstrual bleeding, starting with light bleeding and progressing to heavy, bright red bleeding. Her menstrual cycles are usually regular, with variable flow. She is not on birth control due to seizures.  Abdominal pain is present, difficult to localize, occurring in the upper abdomen, and waking her at night. The pain is intermittent and worsens after eating. She has no gallbladder and recalls a similar episode around Easter, when she was told she had an ovarian cyst.  She is on spironolactone for hidradenitis suppurativa and recently started Benzelec. She has a past traumatic experience with medical exams, making certain procedures difficult.  She is experiencing hair thinning and is concerned about perimenopausal symptoms. Her mother had a hysterectomy at 75, raising concerns about her reproductive health.  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 Her white blood cell count is typically high due to hidradenitis suppurativa flare-ups. She reports itching and is concerned about a possible urinary tract infection. No changes in bowel movements.    Review of Systems  Constitutional:  Negative for chills and fever.  Gastrointestinal:  Positive for abdominal pain. Negative for constipation and diarrhea.  Genitourinary:  Positive for flank pain. Negative for dysuria, frequency, hematuria and urgency.        Objective:    BP 122/70  (Cuff Size: Large)   Pulse 97   Temp 98 F (36.7 C) (Oral)   Resp 16   Ht 5' 4 (1.626 m)   Wt 203 lb 11.2 oz (92.4 kg)   LMP 04/02/2024   SpO2 99%   BMI 34.97 kg/m  BP Readings from Last 3 Encounters:  04/07/24 122/70  12/10/23 120/80  11/28/23 112/76   Wt Readings from Last 3 Encounters:  04/07/24 203 lb 11.2 oz (92.4 kg)  12/10/23 206 lb (93.4 kg)  11/28/23 215 lb (97.5 kg)      Physical Exam Constitutional:      Appearance: Normal appearance.  HENT:     Head: Normocephalic and atraumatic.  Eyes:     Conjunctiva/sclera: Conjunctivae normal.  Cardiovascular:     Rate and Rhythm: Normal rate and regular rhythm.  Pulmonary:     Effort: Pulmonary effort is normal.     Breath sounds: Normal breath sounds.  Skin:    General: Skin is warm and dry.  Neurological:     General: No focal deficit present.     Mental Status: She is alert. Mental status is at baseline.  Psychiatric:        Mood and Affect: Mood normal.        Behavior: Behavior normal.     No results found for any visits on 04/07/24.      Assessment & Plan:   Assessment & Plan Abnormal uterine bleeding  Intermittent heavy bleeding likely due to perimenopausal hormonal changes. Excluded hormonal contraceptives due to seizure disorder. - Order thyroid function tests. - Monitor  menstrual cycle. - Consider checking prolactin and estrogen levels if irregularities persist.  Right ovarian cyst, simple Large simple right ovarian cyst identified on US  from last year. Pain may be related to cysts but not localized, complicating diagnosis. - Perform urine test to rule out infection (negative). - Consider repeat ultrasound if pain persists and initial tests are normal. - Discuss potential for gynecological evaluation under sedation if ultrasound is needed and cannot be tolerated.  Right Flank Pain Intermittent pain possibly related to ovarian cysts, kidney issues, or UTI. Pain not clearly localized. -  Perform urine test to rule out infection. - Conduct vaginal swab for yeast or bacterial vaginosis. - Consider further imaging if pain persists and initial tests are normal.  Hidradenitis suppurativa Chronic condition with flare-ups. Previous labs showed elevated WBC likely due to HS flare. - Recheck CBC to evaluate WBC count.  Hair loss, possible endocrine etiology Hair thinning possibly related to thyroid dysfunction or hormonal changes. - Order thyroid function tests.  - POCT Urinalysis Dipstick - TSH - CBC w/Diff/Platelet - Vitamin D  (25 hydroxy) - Cervicovaginal ancillary only    Return if symptoms worsen or fail to improve.  Sharyle Fischer, DO

## 2024-04-08 LAB — CERVICOVAGINAL ANCILLARY ONLY
Bacterial Vaginitis (gardnerella): NEGATIVE
Candida Glabrata: NEGATIVE
Candida Vaginitis: NEGATIVE
Chlamydia: NEGATIVE
Comment: NEGATIVE
Comment: NEGATIVE
Comment: NEGATIVE
Comment: NEGATIVE
Comment: NEGATIVE
Comment: NORMAL
Neisseria Gonorrhea: NEGATIVE
Trichomonas: NEGATIVE

## 2024-04-09 ENCOUNTER — Ambulatory Visit: Payer: Self-pay | Admitting: Internal Medicine

## 2024-04-13 DIAGNOSIS — L732 Hidradenitis suppurativa: Principal | ICD-10-CM

## 2024-04-13 MED ORDER — HYDROCODONE 5 MG-ACETAMINOPHEN 325 MG TABLET
ORAL_TABLET | Freq: Four times a day (QID) | ORAL | 0 refills | 2.00000 days | Status: CP | PRN
Start: 2024-04-13 — End: ?

## 2024-04-13 NOTE — Unmapped (Signed)
 You are welcome to join the [facility] for HS of the [employer] group online at [email or web address] or in-person at our regular meetings.  You can textHS to [phone] for meeting reminders or join the group online for regular updates.  This can be a great opportunity to interact and learn from other patients and help work with the HS community.  We hope to see your there!    Hidradentis Suppurativa (pronounced ???high-drad-en-eye-tis/sup-your-uh-tee-vah???) is a chronic disease of hair follicles.  The lesions occur most commonly on areas of skin-to-skin contact: under the arms (axillary area), in the groin, around the buttocks, in the region around the anus and genitals, and on the skin between and under the breasts. In women, the underarms, groin, and breast areas are most commonly affected. Men most often have HS lesions on the buttocks and under the arms and may also have HS at the back of the neck and behind and around the ears.    What does HS look and feel like?   The first thing that someone with HS notices is a tender, raised, red bump that looks like an under-the-skin pimple or boil. Sometimes HS lesions have two or more ???heads.???  In mild disease only an occasional boil or abscess may occur, but in more active disease there can be many new lesions every month.  Some abscesses can become larger and may open and drain pus.  Bleeding and increased odor can also occur. In severe disease, deeper abscesses develop and may connect with each other under the skin to form tunnel-like tracts (sinuses, fistulas).  These may drain constantly, or may temporarily improve and then usually begin draining again over time.  In people who have had sinus tracts for some time, scars form that feel like ropes under the skin. In the very worst cases, networks of sinus tracts can form deeper in the body, including the muscle and other tissues. Many people with severe HS have scars that can limit their ability to freely move their arms or legs, though this is very unlikely for most patients.     Clinicians usually classify or ???grade??? HS using the [facility] staging system according to the severity of the disease for each body location:   [place] stage I: one or more abscesses are present, but no sinus tracts have formed and no scars have developed   [place] stage II: one or more abscesses are present that resolve and recur; on sinus tract can be present and scarring is seen   Tressia stage III: many abscesses and more than one sinus tract is present with extensive scars.    What causes HS?  The cause of HS is not completely understood.  It seems to be a disorder of hair follicles and often many family members are affected so genetics probably play a strong role.  Bacteria are often present and may make the disease worse, but infection does not seem to be the main cause. Hormones are also likely play a role since the condition typically starts around puberty when hair follicles under the arms and in the groin start to change.  It can sometimes flare with menstrual cycles in women as well.  In most cases it lasts for decades and starts to improve to some extent in the late 30s and 40s as long as many fistulas have not already formed.  Women are three times more likely than men to develop HS.    Other factors are known to contribute to HS  flaring or becoming worse, though they are likely not the main causes. The factors most commonly associated with HS include:   Cigarette smoking - Stopping smoking will likely not cure the disease, but likely is helpful in reducing how much and how often it flares and may prevent it from getting as bad over time.   Higher weight - HS may occur even in people that are not overweight, but it is much more common in patients that are.  There is some evidence that losing weight and eating a diet low in sugars and fats may be helpful in improving hidradenitis, though this is not helpful for everyone.  Working with a nutritionist may be an important way to help with this and is something your physician can help coordinate    Hidradenitis is not contagious.  It is not caused by a problem with personal hygiene or any other activity or behavior of those with the disease.    How can your doctor help you treat your hidradenitis?  Clinicians use both medication and surgery to treat HS. The choice of treatment--or combination of treatments--is made according to an individual patient???s needs. Clinicians consider several factors in determining the most appropriate plan for therapy:   Severity of disease - medications and some laser treatments are usually able to control disease best when fistulas are not present.  Fistulas typically require surgery.   Extent and location of disease   Chronicity (how often the lesions recur)    A number of different surgical methods have been developed that are useful for certain patients under particular circumstances. These can be done with local numbing and healing at home for some areas when disease is not too extensive with relatively brief recovery times.  In more extensive disease there may be a need for larger excisions under general anesthesia with healing time in the hospital and prolonged recovery periods for better disease control.      In addition, many medical treatments have been tried--some with more success than others. No medication is effective for all patients, and you and your doctor may have to try several different treatments or combinations of treatments before you find the treatment plan that works best for you.  The goals of therapy with medications that are either topical (used on the skin) or systemic (taken by mouth) are:  1. to clear the lesions or at least reduce their number and extent, and  2. to prevent new lesions from forming.  3. To reduce pain, drainage, and odor  Some of the types of medications commonly used are antibacterial skin washes and the topical antibiotics to prevent secondary infections and corticosteroid injections into the lesions to reduce inflammation.     Other medications that may be used include retinoids (similar to Accutane), drugs that effect how hormones and hair follicles interact, drugs that affect your immune system (such as methotrexate, adalimumab /Humira , and Remicaid/infliximab), steroids, and oral antibiotics.    Lasers that destroy hair follicles can also be helpful since they reduce the hair follicles that cause the problems.  Multiple treatments are typically required over time and there is some discomfort associated with treatment, but it is typically very fast and well-tolerated.    It is very important to realize that hidradenitis cannot usually be completely cured with any single medication or surgical procedure.  It is a disease that can be very stubborn and difficult to control, but with good treatment a lot of improvement and sometimes temporary remissions can be  obtained. Poorly controlled disease can cause more fistulas to form and make managing the disease much more difficult over time so it is important to seek care to reduce major flares.  Surgery can provide a long term cure in some areas, though the disease can start again or continue in nearby areas.  A dermatologist is often the best person to help coordinate disease treatment, and sometimes other surgeons, pain specialists, other specialists, and nutritionists may be part of the treatment team.    For severe disease, the first goal is often to reduce pain and symptoms with medicines so that the disease feels more stable. Once it's stable, we often start thinking about how to address areas that have completely gotten better with surgery if they are still causing problems.    What can you do to help your HS?  1. Stopping smoking is hard and may not fix everything, but it may be a step in the right direction.  We or your primary care physician can provide resources to help stop if you are interested.  2. Follow a healthy diet and try to achieve a healthy weight.  Some other self-help measures are:   Keep your skin cool and dry (becoming overheated and sweating can contribute to an HS flare)   To reduce the pain of cysts or nodules or to help them to drain, apply hot compresses or soak in hot water for 10 minutes at a time (use a clean washcloth or a teabag soaked in hot water)   For female patients, cotton underwear that does not have tight elastic in the groin can be helpful.  Boyshort, brief, or boxer style underwear may be a better option as friction on hair follicles in affected areas can be a major trigger in some patients.  These can be easily found on [place] or with some retailers.  Fruit of [employer] Loom and [employer]works are two brands that are sometimes recommended.    Finally, know that you are not alone. Coping with the pain and other symptoms of HS can be very difficult, so it may be helpful to connect with others who live with HS. Patient groups and networks can be sources of important information and support. Some internet resources for information and connections are provided below.    Psychologytoday.com is a resource to find psychologists and therapists that can help support you in your are     [email or web address] can help connect with sexual health resources and counselors    Resources for Information    The Hidradenitis Suppurativa Foundation: A nonprofit organized by a group of physicians interested in treating and advancing research in hidradenitis suppurativa.  This group advocates for better care and research for hidradenitis and has educational materials put together specifically for patients that have been reviewed and produced by doctors and people with hidradenitis.    American Academy of Dermatology  ARanked.fi    [employer]  ElevatorPitchers.de.html  [employer]: [employer]  https://www.rarediseases.org/rare-disease-information/rare-diseases/byID/358/viewAbstract  Trials of new medications for HS  Https://www.clinicaltrials.gov  Managing your wound after skin surgery    Please avoid strenuous activity for 48 hours and all heavy exercise for 1 week  If your wound is on your arm, leg, or shoulder, then avoid heavy lifting, straining, or exercise with that limb for at least one week.  If your wound is on the head or neck, avoid bending over if at all possible.  Sleep with your head elevated for 48 hours  to reduce the risk of bleeding.  Please don???t smoke for 3 weeks; smoking prevents healthy wound healing  Pain management: Use  600 mg of acetaminophen  (Tylenol  ) every 4 hours, or medication we prescribed as needed. Pain should decrease steadily for the 1st few days after your surgery.   Possible complications:  Bleeding: a small amount of blood on the bandage is normal.   If there is a significant bleeding into the bandage or beneath the stitches (this will look like swelling and purple discoloration), apply firm pressure  with a cloth or bandage for 20 minutes without interruption. Repeat if bleeding continues. If it persists, please call [phone]. During nonoffice hours use the message of this number to contact the on-call dermatologist or go to the emergency room.  Infection: usually indicated by pain that increases 4 to 6 days after surgery. Mild redness, swelling, soreness are normal, but, with increasing redness, pain, drainage, and swelling, you should contact our office.  Decreased sensation, increased sensitivity, and itching may last for 18 months.  Scarring: scars mature for one year after surgery. Reducing motion and tension over the wound for the 1st month, as well as using sun protection, reduces scarring.  Bruising is normal around the wound and resolves over 2 to 3 weeks.  For wounds with dissolving stitches:  Please keep the original bandage in place for at least 48 hours. You can remove the large outer layer at that time and keep the small bandage clean and dry for one week or until you return. If the  bandage becomes soiled, wet, or detached, you can reinforce it with tape or add petrolatum  (Vaseline) to the stitches, then place a fresh, clean, nonstick bandage (telfa) with tape or use a non stick Band-Aid.  For wounds with stitches that need to be removed:  Please keep the original bandage in place for at least 48 hours. You can remove the large outer bandage at that time. You may leave the smaller bandage in place until it falls off by itself, or change it after it gets wet with bathing. To change, apply a layer of petrolatum (Vaseline) over top of the Steri-Strips (thin white stripes) and cover with a nonstick stick bandage (telfa) and  paper tape or use a non stick Band-Aid.  If you???re changing the bandage, you can gently remove any crust with warm water and mild soap using a soft cloth a Q-tip. Do not use triple antibiotic ointment, Neosporin, or hydrogen peroxide.  After the stitches and bandages have been removed: clean daily with water with warm water and gentle soap using a soft cloth. You can continue to use a small bandage and sunscreen for one to 2 months or until fully healed.    If instructed to use vinegar soaks, use a fresh bottle of white vinegar and bottled water to make the soak  Please add 1 cup of white vinegar to a quart of room temperature water.  Soak affected area for 10-20 minutes once or twice a day.

## 2024-04-13 NOTE — Unmapped (Signed)
 Dermatology Note    Assessment and Plan:      Hidradenitis Suppurativa (HS): [place] Stage 2, chronic, continues to flare around menses, not at patient goal  - S/p unroofing procedure in the L perianal area, R perianal area, and L labia majora (partial labial removal) on 12/04/2022  - previous treatments: Humira  (helpful but difficulty maintaining insurance coverage), Cosentyx  inadequate response  - We discussed the typical natural history, pathogenesis, treatment options, and expected course as well as the relapsing and sometimes recalcitrant nature of the disease.    - Patient is unable to take rifampin  because it interacts with her seizure medications (briviact and felbatol)  - She is flaring even when a few days late for q2 week loading so we'll plan to continue q2 week for maintenance.    - Continue metformin  500 mg BID. Side effects discussed. Refilled today  - Continue spironolactone  (ALDACTONE ) 100 MG tablet; Take 2 tablets (200 mg total) by mouth daily.   -continue Bimzelx  320mg  every 2 weeks, side effects reviewed  - Surgery planned for L inguinal and buttocks crease about 2cm for each of 2 sites.    Complete complex lesional Unroofing procedure with excision for hidradenitis in location L inguinal, R buttocks, L buttocks:  Risk, benefits, and alternatives were discussed.  Following alcohol prep, anesthesia with 0.5% lidocaine with epinephrine was first performed with a total of 35ml, and then fistula probe and/or forceps was used to delineate the involved area.  Iris scissor was used to open all detectable sinuses. Complex sequential unroofing and wound contouring with beveling was performed with scissor or blade as appropriate to assure optimal tissue sparing and wound healing.  Hemostasis was obtained in the usual fashion with pressure, AlCl solution, and/or cautery.  Area was dressed with petrolatum and bandage.  Wound care instructions given. We will contact the patient with results when available. Time spent in procedure: 70 minutes. Size of wound 30 x 20 mm L inguinal, 30 x 25mm L buttocks, 25 x 20 mm R buttocks.    Norco 5/325mg  for post op pain q6 H prn    High risk medication use (Cosentyx )  - Negative Quant gold obtained 07/03/2022  - Adalimumab /Adalimumab  Ab slightly low 07/03/2022 so was unable to increase Humira  dosage      The patient was advised to call for an appointment should any new, changing, or symptomatic lesions develop.     RTC: 4 months  _________________________________________________________________      Chief Complaint     Chief Complaint   Patient presents with    Procedure     Surgery in groin area and buttocks        HPI     Legend Pecore is a 43 y.o. female who presents as a returning patient (last seen by Dr. Paulita on 03/16/2024) to Dermatology for follow up of hidradenitis suppurativa. Hidradenitis suppurativa been on Bimzelx  for about 4 months with much better response than Cosentyx  or prior TNFi. Has a few persistent areas left to address surgically today, but overall feels like she has made great progress.  The patient denies any other new or changing lesions or areas of concern.     Pertinent Past Medical History     Pulled from prior notes:    HS Disease course:  Year when symptoms first noticed: Age 43   Year of diagnosis: Age 36  Who diagnosed you? Dermatologist  Location of first symptoms: axillae  Typical involved areas include: groin  Typical number  of inflammatory lesions each month at baseline (from first visit): three to five  Disease triggers: stress, sweat and menstrual cycle     Are menstrual cycles irregular when not on birth control? No.  Current form of contraception: None  Effect of hormonal contraception on disease: n/a  Flaring with menstrual cycle (before, during, or after?): during  Difficulty becoming pregnant? n/a  Pregnancy complications? never pregnant    How many children? 0  Better or worse with pregnancy?  never pregnant.  If so, worse during a specific trimester? never pregnant       Prior treatments:  Topical: Clobetasol   Systemic: Keflex , Doxycyline, Humira  titrated up to 80mg  weekly, but stopped in summer 2024 due to insurance issues, Clindamycin   Past surgical procedures: Yes -bilateral wide excisions of the axilla healed well since 2008 or 2009.  She had more recent excisions in the bilateral inguinal folds in 2016-2018.  These have mostly healed well but she has some persistent areas in particular along the infra abdominal fold and one in the right inguinal fold  Past laser procedures: No     Past Medical History, Family History, Social History, Medication List, Allergies, and Problem List were reviewed in the rooming section of Epic.     ROS: Other than symptoms mentioned in the HPI, no fevers, chills, or other skin complaints    Physical Examination     Gen: Well-appearing patient, appropriate, interactive, in no acute distress  SKIN (Focal Skin Exam): Per patient request, examination of bilateral axillae, chest, abdomen, bilateral thighs, groin, buttocks, and external genitalia was performed    location Abscess Inflamed nodule Non-inflamed nodule Draining sinus Non-draining Sinus Hurley % scar   R axilla          L axilla          R inframammary          L inframammary          Intermammary          Pubic          R inguinal          R thigh          L inguinal  1   1     L thigh          Scrotum/Vulva          Perianal          R buttock     1     L buttock  1   1     Other (list)                      AN count (total sum of abscess and inflammatory nodule): 2  Pilonidal sinus? No

## 2024-05-16 ENCOUNTER — Ambulatory Visit: Admission: RE | Admit: 2024-05-16 | Discharge: 2024-05-16 | Disposition: A | Discharge status: Home / Self Care

## 2024-05-16 VITALS — BP 110/75 | HR 106 | Temp 97.7°F | Resp 18

## 2024-05-16 DIAGNOSIS — B372 Candidiasis of skin and nail: Secondary | ICD-10-CM

## 2024-05-16 MED ORDER — NYSTATIN 100000 UNIT/GM EX CREA: TOPICAL_CREAM | CUTANEOUS | 0 refills | Status: AC

## 2024-05-16 MED ORDER — FLUCONAZOLE 150 MG PO TABS: 150.0000 mg | ORAL_TABLET | Freq: Once | ORAL | 0 refills | Status: AC

## 2024-05-16 NOTE — ED Triage Notes (Signed)
 Patient to Urgent Care with complaints of red/ sore/ itchy rash under bilateral breasts.  Symptoms x2 weeks.   Attempted use of vagisil w/o relief.

## 2024-05-16 NOTE — ED Provider Notes (Signed)
 Destiny Flores    CSN: [id number] Arrival date & time: 05/16/24  1146      History   Chief Complaint Chief Complaint  Patient presents with   Rash    I think its actually a yeast infection under the breasts. Its red sore itchy its under both of them. I am on medication that can cause yeast infection and they told me to look out for it when i went on it. - Entered by patient    HPI Destiny Flores is a 43 y.o. female.  Patient presents with burning, pruritic red rash under both breast.  The rash started under one breast but has spread to the other.  She has attempted treatment with OTC Vagisil cream.  No open wounds, drainage, fever, chills, chest pain, shortness of breath.  The history is provided by the patient and medical records.    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   Family history of breast cancer    5/23 cancer genetic testing letter sent   GERD (gastroesophageal reflux disease)    Headache    Hidradenitis suppurativa 12/24/2017   History of kidney stones    H/O   History of methicillin resistant staphylococcus aureus (MRSA) 2008   positive from axilla, left   Hx of Kawasaki's disease    Hydradenitis    Seizures (HCC)    LAST SEIZURE 2012   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   battery change in 2019, inserted in 2002    Patient Active Problem List   Diagnosis Date Noted   History of kidney stones 10/17/2021   Allergic rhinitis 10/31/2020   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    AXILLARY HIDRADENITIS EXCISION     MULTIPLE HYDRADENITIS SURGERIES IN [place]    hidradenitis groin Left 04/2017   last surgery done on groin and the symptoms have returned   HYDRADENITIS EXCISION N/A 01/16/2018   Procedure: EXCISION HIDRADENITIS GROIN;  Surgeon: Jordis Laneta FALCON, MD;  Location: [facility] ORS;  Service: General;  Laterality: N/A;   KNEE SURGERY Left 2010   tendon and meniscus repair   NOSE SURGERY  1998   deviated septum and sinus repair   ROBOTIC ASSISTED LAPAROSCOPIC CHOLECYSTECTOMY N/A 07/24/2018   Procedure: ROBOTIC ASSISTED LAPAROSCOPIC CHOLECYSTECTOMY;  Surgeon: Jordis Laneta FALCON, MD;  Location: [facility] ORS;  Service: General;  Laterality: N/A;   TONSILLECTOMY     TYMPANOSTOMY TUBE PLACEMENT     as a kid   VAGUS NERVE STIMULATOR GENERATOR CHANGE  03/2015    OB History   No obstetric history on file.      Home Medications    Prior to Admission medications   Medication Sig Start Date End Date Taking? Authorizing Provider  bimekizumab-bkzx (BIMZELX) 320 MG/2ML pen Inject into the skin. 12/20/23  Yes [provider]  fluconazole  (DIFLUCAN ) 150 MG tablet Take 1 tablet (150 mg total) by mouth once for 1 dose. 05/16/24 05/16/24 Yes Corlis Burnard DEL, NP  nystatin  cream (MYCOSTATIN ) Apply to affected area 2 times daily 05/16/24  Yes Corlis Burnard DEL, NP  ascorbic acid (VITAMIN C) 1000 MG tablet Take by mouth.    [provider]  BIMZELX 320 MG/2ML pen Inject the contents of 1 auto-injector (320 mg) under the skin every 2 weeks for maintenance 10/31/23   [provider]  Brivaracetam  (BRIVIACT) 100 MG TABS Take 1 tablet by mouth 2 (two) times daily. 11/21/20   [provider]  clindamycin  (CLEOCIN ) 300 MG capsule Take 1 capsule (300 mg total) by mouth 2 (two) times daily. Take for 7 d for HS flares 12/10/23   Tapia, Leisa, PA-C  felbamate  (FELBATOL ) 600 MG tablet Take 600 mg by mouth 3 (three) times daily.    [provider]  magnesium oxide (MAG-OX) 400 MG tablet Take by mouth.    [provider]  Melatonin 3 MG CAPS Take by mouth.    [provider]  metFORMIN (GLUCOPHAGE) 500 MG tablet Take by mouth. 07/03/22 04/07/24  [provider]  ondansetron  (ZOFRAN -ODT) 4 MG disintegrating tablet Take 1 tablet (4 mg total) by mouth every 8 (eight) hours as needed. 09/30/22   Cuthriell, Dorn BIRCH, PA-C  pantoprazole  (PROTONIX ) 40 MG tablet TAKE 1 TABLET(40 MG) BY MOUTH TWICE DAILY 11/04/23   Tapia, Leisa, PA-C  pyridOXINE (VITAMIN B-6) 100 MG tablet Take 100 mg by mouth daily.    [provider]  spironolactone (ALDACTONE) 100 MG tablet Take by mouth. 12/10/19   [provider]  SUMAtriptan  (IMITREX ) 50 MG tablet Take 1 tablet (50 mg total) by mouth daily as needed. 07/05/23   Mecum, Erin E, PA-C  tiZANidine (ZANAFLEX) 2 MG tablet Take 2 mg by mouth 2 (two) times daily as needed. 11/16/23   [provider]  topiramate (TOPAMAX) 25 MG tablet Take 50 mg by mouth at bedtime. 09/25/21   [provider]  TURMERIC CURCUMIN PO Take 2 capsules by mouth daily.    [provider]  Vitamin D , Ergocalciferol , (DRISDOL) 1.25 MG (50000 UNIT) CAPS capsule Take 50,000 Units by mouth every 7 (seven) days.    [provider]  zinc gluconate 50 MG tablet Take 50 mg by mouth daily.    [provider]    Family History Family History  Problem Relation Age of Onset   Hyperlipidemia Father    Epilepsy Brother    ADD / ADHD Brother    Breast cancer Paternal Grandmother 51       Passed away at 59yo   Asthma  Mother     Social History Social History   Tobacco Use   Smoking status: Never   Smokeless tobacco: Never  Vaping Use   Vaping status: Never Used  Substance Use Topics   Alcohol use: Never   Drug use: Never     Allergies   Codeine, Penicillins, Sulfa antibiotics, and Tape   Review of Systems Review of Systems  Constitutional:  Negative for chills and fever.  Respiratory:  Negative for cough and shortness of breath.   Cardiovascular:  Negative for chest pain and palpitations.  Skin:  Positive for color change and rash.     Physical Exam Triage Vital Signs ED Triage Vitals  Encounter Vitals Group  BP 05/16/24 1209 110/75     Girls Systolic BP Percentile --      Girls Diastolic BP Percentile --      Boys Systolic BP Percentile --      Boys Diastolic BP Percentile --      Pulse Rate 05/16/24 1209 (!) 106     Resp 05/16/24 1209 18     Temp 05/16/24 1209 97.7 F (36.5 C)     Temp src --      SpO2 05/16/24 1209 97 %     Weight --      Height --      Head Circumference --      Peak Flow --      Pain Score 05/16/24 1213 6     Pain Loc --      Pain Education --      Exclude from Growth Chart --    No data found.  Updated Vital Signs BP 110/75   Pulse (!) 106   Temp 97.7 F (36.5 C)   Resp 18   LMP 05/04/2024   SpO2 97%   Visual Acuity Right Eye Distance:   Left Eye Distance:   Bilateral Distance:    Right Eye Near:   Left Eye Near:    Bilateral Near:     Physical Exam Constitutional:      General: She is not in acute distress. HENT:     Mouth/Throat:     Mouth: Mucous membranes are moist.  Cardiovascular:     Rate and Rhythm: Normal rate.  Pulmonary:     Effort: Pulmonary effort is normal. No respiratory distress.  Skin:    General: Skin is warm and dry.     Findings: Rash present.     Comments: Erythematous rash under both breasts.  No open wounds or drainage.  Neurological:     Mental Status: She is alert.      UC Treatments /  Results  Labs (all labs ordered are listed, but only abnormal results are displayed) Labs Reviewed - No data to display  EKG   Radiology No results found.  Procedures Procedures (including critical care time)  Medications Ordered in UC Medications - No data to display  Initial Impression / Assessment and Plan / UC Course  I have reviewed the triage vital signs and the nursing notes.  Pertinent labs & imaging results that were available during my care of the patient were reviewed by me and considered in my medical decision making (see chart for details).    Candidal dermatitis.  Afebrile and vital signs are stable.  Treating today with nystatin  cream and 1 dose of Diflucan .  Education provided on skin yeast infection.  Instructed patient to follow-up with her PCP if she is not improving.  She agrees to plan of care.  Final Clinical Impressions(s) / UC Diagnoses   Final diagnoses:  Candidal dermatitis     Discharge Instructions      Take the Diflucan  and use the nystatin  cream as directed.  Follow-up with your primary care provider if your symptoms are not improving.      ED Prescriptions     Medication Sig Dispense Auth. Provider   fluconazole  (DIFLUCAN ) 150 MG tablet Take 1 tablet (150 mg total) by mouth once for 1 dose. 1 tablet Corlis Burnard DEL, NP   nystatin  cream (MYCOSTATIN ) Apply to affected area 2 times daily 30 g Corlis Burnard DEL, NP      PDMP not reviewed this  encounter.   Corlis Burnard DEL, NP 05/16/24 [phone]

## 2024-05-16 NOTE — Discharge Instructions (Addendum)
Take the Diflucan and use the nystatin cream as directed.  Follow up with your primary care provider if your symptoms are not improving.

## 2024-05-18 ENCOUNTER — Emergency Department

## 2024-05-18 ENCOUNTER — Emergency Department
Admission: EM | Admit: 2024-05-18 | Discharge: 2024-05-18 | Disposition: A | Discharge status: Home / Self Care | Attending: Emergency Medicine | Admitting: Emergency Medicine

## 2024-05-18 ENCOUNTER — Other Ambulatory Visit: Payer: Self-pay

## 2024-05-18 DIAGNOSIS — R1031 Right lower quadrant pain: Secondary | ICD-10-CM | POA: Insufficient documentation

## 2024-05-18 DIAGNOSIS — R110 Nausea: Secondary | ICD-10-CM | POA: Diagnosis not present

## 2024-05-18 DIAGNOSIS — R10A1 Flank pain, right side: Secondary | ICD-10-CM

## 2024-05-18 LAB — CBC
HCT: 38.5 % (ref 36.0–46.0)
Hemoglobin: 12.9 g/dL (ref 12.0–15.0)
MCH: 30.5 pg (ref 26.0–34.0)
MCHC: 33.5 g/dL (ref 30.0–36.0)
MCV: 91 fL (ref 80.0–100.0)
Platelets: 562 K/uL — ABNORMAL HIGH (ref 150–400)
RBC: 4.23 MIL/uL (ref 3.87–5.11)
RDW: 12.8 % (ref 11.5–15.5)
WBC: 11.4 K/uL — ABNORMAL HIGH (ref 4.0–10.5)
nRBC: 0 % (ref 0.0–0.2)

## 2024-05-18 LAB — COMPREHENSIVE METABOLIC PANEL WITH GFR
ALT: 12 U/L (ref 0–44)
AST: 19 U/L (ref 15–41)
Albumin: 4.2 g/dL (ref 3.5–5.0)
Alkaline Phosphatase: 86 U/L (ref 38–126)
Anion gap: 12 (ref 5–15)
BUN: 11 mg/dL (ref 6–20)
CO2: 22 mmol/L (ref 22–32)
Calcium: 9.3 mg/dL (ref 8.9–10.3)
Chloride: 102 mmol/L (ref 98–111)
Creatinine, Ser: 0.7 mg/dL (ref 0.44–1.00)
GFR, Estimated: >60 mL/min (ref >60)
Glucose, Bld: 91 mg/dL (ref 70–99)
Potassium: 3.8 mmol/L (ref 3.5–5.1)
Sodium: 136 mmol/L (ref 135–145)
Total Bilirubin: 0.3 mg/dL (ref 0.0–1.2)
Total Protein: 7.5 g/dL (ref 6.5–8.1)

## 2024-05-18 LAB — URINALYSIS, ROUTINE W REFLEX MICROSCOPIC
Bilirubin Urine: NEGATIVE
Glucose, UA: NEGATIVE mg/dL
Hgb urine dipstick: NEGATIVE
Ketones, ur: NEGATIVE mg/dL
Leukocytes,Ua: NEGATIVE
Nitrite: NEGATIVE
Protein, ur: NEGATIVE mg/dL
Specific Gravity, Urine: 1.003 — ABNORMAL LOW (ref 1.005–1.030)
pH: 6 (ref 5.0–8.0)

## 2024-05-18 LAB — POC URINE PREG, ED: Preg Test, Ur: NEGATIVE

## 2024-05-18 LAB — LIPASE, BLOOD: Lipase: 26 U/L (ref 11–51)

## 2024-05-18 MED ORDER — KETOROLAC TROMETHAMINE 30 MG/ML IJ SOLN
30.0000 mg | Freq: Once | INTRAMUSCULAR | Status: AC
  Administered 2024-05-18: 30 mg via INTRAVENOUS
  Filled 2024-05-18: qty 1

## 2024-05-18 MED ORDER — HYDROCODONE-ACETAMINOPHEN 5-325 MG PO TABS: 1.0000 | ORAL_TABLET | ORAL | 0 refills | Status: AC | PRN

## 2024-05-18 MED ORDER — SODIUM CHLORIDE 0.9 % IV BOLUS
500.0000 mL | Freq: Once | INTRAVENOUS | Status: AC
  Administered 2024-05-18: 500 mL via INTRAVENOUS

## 2024-05-18 MED ORDER — ONDANSETRON HCL 4 MG/2ML IJ SOLN
4.0000 mg | Freq: Once | INTRAMUSCULAR | Status: AC
  Administered 2024-05-18: 4 mg via INTRAVENOUS
  Filled 2024-05-18: qty 2

## 2024-05-18 NOTE — Discharge Instructions (Signed)
 As we discussed please take your pain medication as needed, as prescribed.  Do not drink alcohol or drive while taking pain medication.  Return to the emergency department should your pain worsen, or any other symptom personally concerning to yourself.  Otherwise please follow-up with your doctor in 2 to 3 days for recheck/reevaluation.

## 2024-05-18 NOTE — ED Provider Notes (Signed)
 [facility] Provider Note    Event Date/Time   First MD Initiated Contact with Patient 05/18/24 1324     (approximate)   History   Flank Pain   HPI  Destiny Flores is a 43 y.o. female with history of kidney stones who presents with complaints of right flank pain which is into her right groin.  She reports this feels very similar to prior kidney stones.  No fevers no dysuria.  She has a history of a cholecystectomy.  Mild nausea, symptoms started this morning     Physical Exam   Triage Vital Signs: ED Triage Vitals [05/18/24 1242]  Encounter Vitals Group     BP 121/76     Girls Systolic BP Percentile      Girls Diastolic BP Percentile      Boys Systolic BP Percentile      Boys Diastolic BP Percentile      Pulse Rate 81     Resp 17     Temp 97.6 F (36.4 C)     Temp src      SpO2 100 %     Weight 90.7 kg (200 lb)     Height 1.626 m (5' 4)     Head Circumference      Peak Flow      Pain Score 7     Pain Loc      Pain Education      Exclude from Growth Chart     Most recent vital signs: Vitals:   05/18/24 1242  BP: 121/76  Pulse: 81  Resp: 17  Temp: 97.6 F (36.4 C)  SpO2: 100%     General: Awake, no distress.  CV:  Good peripheral perfusion.  Resp:  Normal effort.  Abd:  No distention.  No CVA tenderness, mild right lower quadrant tenderness Other:     ED Results / Procedures / Treatments   Labs (all labs ordered are listed, but only abnormal results are displayed) Labs Reviewed  CBC - Abnormal; Notable for the following components:      Result Value   WBC 11.4 (*)    Platelets 562 (*)    All other components within normal limits  URINALYSIS, ROUTINE W REFLEX MICROSCOPIC - Abnormal; Notable for the following components:   Color, Urine COLORLESS (*)    APPearance CLEAR (*)    Specific Gravity, Urine 1.003 (*)    All other components within normal limits  LIPASE, BLOOD  COMPREHENSIVE METABOLIC PANEL WITH GFR  POC  URINE PREG, ED     EKG     RADIOLOGY CT pending    PROCEDURES:  Critical Care performed:   Procedures   MEDICATIONS ORDERED IN ED: Medications  ketorolac  (TORADOL ) 30 MG/ML injection 30 mg (30 mg Intravenous Given 05/18/24 1405)  sodium chloride  0.9 % bolus 500 mL (500 mLs Intravenous New Bag/Given 05/18/24 1403)  ondansetron  (ZOFRAN ) injection 4 mg (4 mg Intravenous Given 05/18/24 1404)     IMPRESSION / MDM / ASSESSMENT AND PLAN / ED COURSE  I reviewed the triage vital signs and the nursing notes. Patient's presentation is most consistent with acute presentation with potential threat to life or bodily function.  Patient presents with right flank pain as detailed above, differential includes ureterolithiasis, pyelonephritis, appendicitis  Will treat with IV Toradol , IV Zofran , IV fluids, obtain labs, CT renal stone study and reevaluate. ----------------------------------------- 3:32 PM on 05/18/2024 -----------------------------------------  Patient is feeling better after treatment, pending CT renal stone  study, have asked my colleague to follow-up on CT result      FINAL CLINICAL IMPRESSION(S) / ED DIAGNOSES   Final diagnoses:  Right flank pain     Rx / DC Orders   ED Discharge Orders     None        Note:  This document was prepared using Dragon voice recognition software and may include unintentional dictation errors.   Arlander Charleston, MD 05/18/24 [phone]

## 2024-05-18 NOTE — ED Triage Notes (Signed)
 Pt comes in via pov with complaints of right sided flank pain that started this morning. Pt complains of pain 7/10 at this time. Pt denies any nausea or vomiting. Pt has a history of kidney stones.

## 2024-05-18 NOTE — ED Notes (Signed)
 See triage note  Presents with some pain to right side of abd   States does move slightly to the back  pain started this am

## 2024-05-18 NOTE — ED Provider Notes (Signed)
-----------------------------------------   4:22 PM on 05/18/2024 ----------------------------------------- Patient CT scan has resulted showing no significant finding besides a simple right ovarian cyst measuring 5 x 3.9 cm.  Patient states her pain is better than when she came in but still says mild pain on the right flank.  Patient status post cholecystectomy, normal labs including LFTs and lipase.  Urinalysis shows no concerning finding.  I discussed with the patient given her pain I would recommend that we obtain the pelvic ultrasound just as a precaution to rule out torsion.  Patient states she cannot tolerate pelvic ultrasounds they have tried in the past and she has not been able to do so.  Patient does not wish to proceed with an ultrasound.  I discussed with patient ultrasound the only way to rule out torsion, patient understands this and states if the pain worsens she will return.  We will discharge with a short course of pain medication and have the patient follow-up with her doctor.  Patient agreeable to plan of care.   Dorothyann Drivers, MD 05/18/24 1623

## 2024-05-19 DIAGNOSIS — G40909 Epilepsy, unspecified, not intractable, without status epilepticus: Secondary | ICD-10-CM | POA: Diagnosis not present

## 2024-06-03 NOTE — Progress Notes (Signed)
 [facility] Refill Coordination Note    Specialty Medication(s) to be Shipped:   Inflammatory Disorders: Bimzelx     Other medication(s) to be shipped: No additional medications requested for fill at this time    Specialty Medications not needed at this time: N/A     Erica Norman, DOB: Nov 19, 1980  Phone: [phone] (home)       All above HIPAA information was verified with patient.     Was a nurse, learning disability used for this call? No    Completed refill call assessment today to schedule patient's medication shipment from the [facility]  [phone]).  All relevant notes have been reviewed.     Specialty medication(s) and dose(s) confirmed: Regimen is correct and unchanged.   Changes to medications: Blossom reports no changes at this time.  Changes to insurance: No  New side effects reported not previously addressed with a pharmacist or physician: None reported  Questions for the pharmacist: Yes: possible side effect    Confirmed patient received a Conservation Officer, Historic Buildings and a Surveyor, Mining with first shipment. The patient will receive a drug information handout for each medication shipped and additional FDA Medication Guides as required.       DISEASE/MEDICATION-SPECIFIC INFORMATION        For patients on injectable medications: Next injection is scheduled for 06/09/24.    SPECIALTY MEDICATION ADHERENCE     Medication Adherence    Patient reported X missed doses in the last month: 0  Specialty Medication: BIMZELX  AUTOINJECTOR 320 mg/2 mL Atin (bimekizumab -bkzx)  Patient is on additional specialty medications: No              Were doses missed due to medication being on hold? No     BIMZELX  AUTOINJECTOR 320 mg/2 mL Atin (bimekizumab -bkzx): 1 doses of medicine on hand        REFERRAL TO PHARMACIST     Referral to the pharmacist: Not needed      [facility]     Shipping address confirmed in Epic.     Cost and Payment: Patient has a copay of $4. They are aware and have authorized the pharmacy to charge the credit card on file.    Delivery Scheduled: Yes, Expected medication delivery date: 06/11/24.     Medication will be delivered via UPS to the prescription address in Epic WAM.    Dena LOISE Dolores   [facility]  Specialty Technician

## 2024-06-10 MED FILL — BIMZELX AUTOINJECTOR 320 MG/2 ML SUBCUTANEOUS AUTO-INJECTOR: SUBCUTANEOUS | 84 days supply | Qty: 12 | Fill #1

## 2024-06-11 ENCOUNTER — Encounter: Admitting: Family Medicine

## 2024-07-01 ENCOUNTER — Encounter: Payer: Self-pay | Admitting: Internal Medicine

## 2024-07-01 ENCOUNTER — Other Ambulatory Visit: Payer: Self-pay

## 2024-07-01 ENCOUNTER — Encounter: Admitting: Internal Medicine

## 2024-07-01 VITALS — BP 122/70 | HR 100 | Temp 97.9°F | Resp 16 | Ht 64.0 in | Wt 205.5 lb

## 2024-07-01 DIAGNOSIS — R102 Pelvic and perineal pain unspecified side: Secondary | ICD-10-CM

## 2024-07-01 DIAGNOSIS — Z13220 Encounter for screening for lipoid disorders: Secondary | ICD-10-CM

## 2024-07-01 DIAGNOSIS — N926 Irregular menstruation, unspecified: Secondary | ICD-10-CM

## 2024-07-01 DIAGNOSIS — Z1231 Encounter for screening mammogram for malignant neoplasm of breast: Secondary | ICD-10-CM

## 2024-07-01 DIAGNOSIS — Z1211 Encounter for screening for malignant neoplasm of colon: Secondary | ICD-10-CM | POA: Diagnosis not present

## 2024-07-01 DIAGNOSIS — K219 Gastro-esophageal reflux disease without esophagitis: Secondary | ICD-10-CM

## 2024-07-01 DIAGNOSIS — Z0000 Encounter for general adult medical examination without abnormal findings: Secondary | ICD-10-CM

## 2024-07-01 MED ORDER — PANTOPRAZOLE SODIUM 40 MG PO TBEC: 40.0000 mg | DELAYED_RELEASE_TABLET | Freq: Every day | ORAL | 1 refills | Status: AC

## 2024-07-01 NOTE — Progress Notes (Signed)
 Name: Destiny Flores   MRN: [id number]    DOB: 02/13/1981   Date:07/01/2024       Progress Note  Subjective  Chief Complaint  Chief Complaint  Patient presents with   Annual Exam    HPI  Patient presents for annual CPE.  Discussed the use of AI scribe software for clinical note transcription with the patient, who gave verbal consent to proceed.  History of Present Illness Destiny Flores is a 43 year old female who presents for an annual physical exam.  She recently had a vagus nerve stimulator battery change that required an ER visit. Recent labs showed chronically elevated white blood cells and platelets that have improved compared to prior results. Kidney, liver, electrolytes, and vitamin D  levels were normal or improved.  She has irregular menstrual cycles with bleeding about every other week after a shot, with severe pelvic pain in October and November that led to ER visits. An internal ultrasound was ordered but not completed due to technical issues. She had a negative Pap smear with gynecology in 2023. She is considering hysterectomy because of ongoing menstrual issues and does not plan to have biological children.  She is up to date on flu, pneumonia, tetanus, and COVID vaccines. She has not received the HPV vaccine and is not sexually active. She has never used birth control because of potential interactions with her seizure medications.  She is due for a mammogram and colon cancer screening as she approaches age 72. She had an endoscopy about 15 years ago but is unsure if a colonoscopy was done.  Current medications include Protonix  40 mg daily for acid reflux and Imitrex  as needed for infrequent migraines.   Diet: Gluten Free Diet Exercise: None  Last Eye Exam: will schedule Last Dental Exam: will schedule  Flowsheet Row Office Visit from 10/04/2022 in [facility]  AUDIT-C Score 0   Depression: Phq 9 is  negative    07/01/2024   11:03 AM  01/17/2023   10:03 AM 12/03/2022    9:12 AM 11/16/2022    9:20 AM 10/31/2022   10:22 AM  Depression screen PHQ 2/9  Decreased Interest 0 0 0 0 2  Down, Depressed, Hopeless 0 0 0 0 0  PHQ - 2 Score 0 0 0 0 2  Altered sleeping  0 0 0 2  Tired, decreased energy  0 0 0 2  Change in appetite  0 0 0 1  Feeling bad or failure about yourself   0 0 0 0  Trouble concentrating  0 0 0 0  Moving slowly or fidgety/restless  0 0 0 0  Suicidal thoughts  0 0 0 0  PHQ-9 Score  0  0  0  7   Difficult doing work/chores  Not difficult at all Not difficult at all Not difficult at all Not difficult at all     Data saved with a previous flowsheet row definition   Hypertension: BP Readings from Last 3 Encounters:  07/01/24 122/70  05/18/24 121/76  05/16/24 110/75   Obesity: Wt Readings from Last 3 Encounters:  07/01/24 205 lb 8 oz (93.2 kg)  05/18/24 200 lb (90.7 kg)  04/07/24 203 lb 11.2 oz (92.4 kg)   BMI Readings from Last 3 Encounters:  07/01/24 35.27 kg/m  05/18/24 34.33 kg/m  04/07/24 34.97 kg/m     Vaccines:reviewed with the patient. Discussed HPV vaccines, patient will think about it but declines today.   Hep C Screening: completed  STD testing and prevention (HIV/chl/gon/syphilis): no concerns Intimate partner violence: negative screen  Sexual History :not active Menstrual History/LMP/Abnormal Bleeding: still having chronic pelvic pain and irregular periods, cannot undergo vaginal US  due to chronic pain  Discussed importance of follow up if any post-menopausal bleeding: not applicable  Incontinence Symptoms: negative for symptoms   Breast cancer:  - Last Mammogram: ordered  Osteoporosis Prevention : Discussed high calcium and vitamin D  supplementation, weight bearing exercises Bone density :not applicable   Cervical cancer screening: up-to-date 5/23, negative   Skin cancer: Discussed monitoring for atypical lesions  Colorectal cancer: will schedule - referral placed    Lung  cancer:  Low Dose CT Chest recommended if Age 49-80 years, 20 pack-year currently smoking OR have quit w/in 15years. Patient does not qualify for screen   ECG: NA  Advanced Care Planning: A voluntary discussion about advance care planning including the explanation and discussion of advance directives.  Discussed health care proxy and Living will, and the patient was able to identify a health care proxy as.  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    AXILLARY HIDRADENITIS EXCISION     MULTIPLE HYDRADENITIS SURGERIES IN [place]    hidradenitis groin Left 04/2017   last surgery done on groin and the symptoms have returned   HYDRADENITIS EXCISION N/A 01/16/2018   Procedure: EXCISION HIDRADENITIS GROIN;  Surgeon: Jordis Laneta FALCON, MD;  Location: [facility] ORS;  Service: General;  Laterality: N/A;   KNEE SURGERY Left 2010   tendon and meniscus repair   NOSE SURGERY  1998   deviated septum and sinus repair   ROBOTIC ASSISTED LAPAROSCOPIC CHOLECYSTECTOMY N/A 07/24/2018   Procedure: ROBOTIC ASSISTED LAPAROSCOPIC CHOLECYSTECTOMY;  Surgeon: Jordis Laneta FALCON, MD;  Location: [facility] ORS;  Service: General;  Laterality: N/A;   TONSILLECTOMY     TYMPANOSTOMY TUBE PLACEMENT     as a kid   VAGUS NERVE STIMULATOR GENERATOR CHANGE   03/2015    Family History  Problem Relation Age of Onset   Hyperlipidemia Father    Epilepsy Brother    ADD / ADHD Brother    Breast cancer Paternal Grandmother 78       Passed away at 56yo   Asthma Mother     Social History   Socioeconomic History   Marital status: Single    Spouse name: Not on file   Number of children: 0   Years of education: 16   Highest education level: Bachelor's degree (e.g., BA, AB, BS)  Occupational History   Occupation: unemployeed  Tobacco Use   Smoking status: Never   Smokeless tobacco: Never  Vaping Use   Vaping status: Never Used  Substance and Sexual Activity   Alcohol use: Never   Drug use: Never   Sexual activity: Never    Partners: Male  Other Topics Concern  Not on file  Social History Narrative   Not on file   Social Drivers of Health   Tobacco Use: Low Risk (07/01/2024)   Patient History    Smoking Tobacco Use: Never    Smokeless Tobacco Use: Never    Passive Exposure: Not on file  Financial Resource Strain: High Risk (06/25/2024)   Overall Financial Resource Strain (CARDIA)    Difficulty of Paying Living Expenses: Hard  Food Insecurity: Food Insecurity Present (06/25/2024)   Epic    Worried About Programme Researcher, Broadcasting/film/video in the Last Year: Sometimes true    Ran Out of Food in the Last Year: Often true  Transportation Needs: No Transportation Needs (06/25/2024)   Epic    Lack of Transportation (Medical): No    Lack of Transportation (Non-Medical): No  Physical Activity: Insufficiently Active (06/25/2024)   Exercise Vital Sign    Days of Exercise per Week: 1 day    Minutes of Exercise per Session: 10 min  Stress: Stress Concern Present (06/25/2024)   [employer] of Occupational Health - Occupational Stress Questionnaire    Feeling of Stress: To some extent  Social Connections: Moderately Isolated (06/25/2024)   Social Connection and Isolation Panel    Frequency of Communication with Friends and Family: Once a week     Frequency of Social Gatherings with Friends and Family: Once a week    Attends Religious Services: 1 to 4 times per year    Active Member of Clubs or Organizations: Yes    Attends Banker Meetings: More than 4 times per year    Marital Status: Never married  Intimate Partner Violence: Not At Risk (07/01/2024)   Epic    Fear of Current or Ex-Partner: No    Emotionally Abused: No    Physically Abused: No    Sexually Abused: No  Depression (PHQ2-9): Low Risk (07/01/2024)   Depression (PHQ2-9)    PHQ-2 Score: 0  Alcohol Screen: Low Risk (10/04/2022)   Alcohol Screen    Last Alcohol Screening Score (AUDIT): 0  Housing: High Risk (06/25/2024)   Epic    Unable to Pay for Housing in the Last Year: Yes    Number of Times Moved in the Last Year: 0    Homeless in the Last Year: No  Utilities: Not At Risk (07/01/2024)   Epic    Threatened with loss of utilities: No  Health Literacy: Adequate Health Literacy (07/01/2024)   B1300 Health Literacy    Frequency of need for help with medical instructions: Never    Current Medications[1]  Allergies[2]   Review of Systems  All other systems reviewed and are negative.    Objective  Vitals:   07/01/24 1105  BP: 122/70  Pulse: 100  Resp: 16  Temp: 97.9 F (36.6 C)  TempSrc: Oral  SpO2: 98%  Weight: 205 lb 8 oz (93.2 kg)  Height: 5' 4 (1.626 m)    Body mass index is 35.27 kg/m.  Physical Exam Constitutional:      Appearance: Normal appearance.  HENT:     Head: Normocephalic and atraumatic.     Mouth/Throat:     Mouth: Mucous membranes are moist.     Pharynx: Oropharynx is clear.  Eyes:     Extraocular Movements: Extraocular movements intact.     Conjunctiva/sclera: Conjunctivae normal.     Pupils: Pupils are equal, round, and reactive to light.  Neck:     Comments: No thyromegaly Cardiovascular:     Rate  and Rhythm: Normal rate and regular rhythm.  Pulmonary:     Effort: Pulmonary effort is normal.      Breath sounds: Normal breath sounds.  Musculoskeletal:     Cervical back: No tenderness.     Right lower leg: No edema.     Left lower leg: No edema.  Lymphadenopathy:     Cervical: No cervical adenopathy.  Skin:    General: Skin is warm and dry.  Neurological:     General: No focal deficit present.     Mental Status: She is alert. Mental status is at baseline.  Psychiatric:        Mood and Affect: Mood normal.        Behavior: Behavior normal.     Last CBC Lab Results  Component Value Date   WBC 11.4 (H) 05/18/2024   HGB 12.9 05/18/2024   HCT 38.5 05/18/2024   MCV 91.0 05/18/2024   MCH 30.5 05/18/2024   RDW 12.8 05/18/2024   PLT 562 (H) 05/18/2024   Last metabolic panel Lab Results  Component Value Date   GLUCOSE 91 05/18/2024   NA 136 05/18/2024   K 3.8 05/18/2024   CL 102 05/18/2024   CO2 22 05/18/2024   BUN 11 05/18/2024   CREATININE 0.70 05/18/2024   GFRNONAA >60 05/18/2024   CALCIUM 9.3 05/18/2024   PHOS 4.0 04/18/2022   PROT 7.5 05/18/2024   ALBUMIN 4.2 05/18/2024   BILITOT 0.3 05/18/2024   ALKPHOS 86 05/18/2024   AST 19 05/18/2024   ALT 12 05/18/2024   ANIONGAP 12 05/18/2024   Last lipids Lab Results  Component Value Date   CHOL 241 (H) 07/05/2023   HDL 50 07/05/2023   LDLCALC 167 (H) 07/05/2023   TRIG 117 07/05/2023   CHOLHDL 4.8 07/05/2023   Last hemoglobin A1c No results found for: HGBA1C Last thyroid functions Lab Results  Component Value Date   TSH 1.96 04/07/2024   Last vitamin D  Lab Results  Component Value Date   VD25OH 31 04/07/2024   Last vitamin B12 and Folate No results found for: VITAMINB12, FOLATE    Assessment & Plan  Assessment & Plan Annual Physical/Woman's Wellness Visit Routine exam with normal blood pressure. Labs showed improved white blood cells and platelets, likely due to chronic inflammation. Kidneys, liver, and electrolytes normal. Vitamin D  improved. Vaccinations up to date except HPV,  declined due to low risk. Mammogram ordered. Colonoscopy referral due to age. Pap smear negative, next due 2028. - Ordered cholesterol labs. - Repeated recent labs for stability. - Referred to GI for colonoscopy. - Instructed to schedule mammogram. - Discussed HPV vaccine; she declined.  Menstrual pain and irregularity Irregular, painful cycles with premenstrual pain. Considering hysterectomy due to ongoing issues and no interest in childbearing. Discussed birth control for regulation, concerns about seizure medication interaction. - Referred to gynecologist  - Discussed birth control for regulation.  Gastroesophageal reflux disease GERD managed with Protonix  40 mg daily. Refill needed. - Refilled Protonix  40 mg daily.  - CBC w/Diff/Platelet - Comprehensive Metabolic Panel (CMET) - MM 3D SCREENING MAMMOGRAM BILATERAL BREAST; Future - Ambulatory referral to Gastroenterology - Lipid Profile - pantoprazole  (PROTONIX ) 40 MG tablet; Take 1 tablet (40 mg total) by mouth daily.  Dispense: 90 tablet; Refill: 1 - Ambulatory referral to Obstetrics / Gynecology   -USPSTF grade A and B recommendations reviewed with patient; age-appropriate recommendations, preventive care, screening tests, etc discussed and encouraged; healthy living encouraged; see AVS for patient education given to  patient -Discussed importance of 150 minutes of physical activity weekly, eat two servings of fish weekly, eat one serving of tree nuts ( cashews, pistachios, pecans, almonds.SABRA) every other day, eat 6 servings of fruit/vegetables daily and drink plenty of water and avoid sweet beverages.   -Reviewed Health Maintenance: Yes.       [1]  Current Outpatient Medications:    ascorbic acid (VITAMIN C) 1000 MG tablet, Take by mouth., Disp: , Rfl:    bimekizumab-bkzx (BIMZELX) 320 MG/2ML pen, Inject into the skin., Disp: , Rfl:    BIMZELX 320 MG/2ML pen, Inject the contents of 1 auto-injector (320 mg) under the skin every  2 weeks for maintenance, Disp: , Rfl:    Brivaracetam (BRIVIACT) 100 MG TABS, Take 1 tablet by mouth 2 (two) times daily., Disp: , Rfl:    clindamycin  (CLEOCIN ) 300 MG capsule, Take 1 capsule (300 mg total) by mouth 2 (two) times daily. Take for 7 d for HS flares, Disp: 14 capsule, Rfl: 2   felbamate  (FELBATOL ) 600 MG tablet, Take 600 mg by mouth 3 (three) times daily., Disp: , Rfl:    HYDROcodone -acetaminophen  (NORCO/VICODIN) 5-325 MG tablet, Take 1 tablet by mouth every 4 (four) hours as needed., Disp: 12 tablet, Rfl: 0   magnesium oxide (MAG-OX) 400 MG tablet, Take by mouth., Disp: , Rfl:    Melatonin 3 MG CAPS, Take by mouth., Disp: , Rfl:    metFORMIN (GLUCOPHAGE) 500 MG tablet, Take by mouth., Disp: , Rfl:    nystatin  cream (MYCOSTATIN ), Apply to affected area 2 times daily, Disp: 30 g, Rfl: 0   ondansetron  (ZOFRAN -ODT) 4 MG disintegrating tablet, Take 1 tablet (4 mg total) by mouth every 8 (eight) hours as needed., Disp: 20 tablet, Rfl: 0   pantoprazole  (PROTONIX ) 40 MG tablet, TAKE 1 TABLET(40 MG) BY MOUTH TWICE DAILY, Disp: 180 tablet, Rfl: 0   pyridOXINE (VITAMIN B-6) 100 MG tablet, Take 100 mg by mouth daily., Disp: , Rfl:    spironolactone (ALDACTONE) 100 MG tablet, Take by mouth., Disp: , Rfl:    SUMAtriptan  (IMITREX ) 50 MG tablet, Take 1 tablet (50 mg total) by mouth daily as needed., Disp: 10 tablet, Rfl: 1   tiZANidine (ZANAFLEX) 2 MG tablet, Take 2 mg by mouth 2 (two) times daily as needed., Disp: , Rfl:    topiramate (TOPAMAX) 25 MG tablet, Take 50 mg by mouth at bedtime., Disp: , Rfl:    TURMERIC CURCUMIN PO, Take 2 capsules by mouth daily., Disp: , Rfl:    Vitamin D , Ergocalciferol , (DRISDOL) 1.25 MG (50000 UNIT) CAPS capsule, Take 50,000 Units by mouth every 7 (seven) days., Disp: , Rfl:    zinc gluconate 50 MG tablet, Take 50 mg by mouth daily., Disp: , Rfl:  [2]  Allergies Allergen Reactions   Codeine Anaphylaxis   Penicillins Anaphylaxis and Other (See Comments)    Has  patient had a PCN reaction causing immediate rash, facial/tongue/throat swelling, SOB or lightheadedness with hypotension: Yes Has patient had a PCN reaction causing severe rash involving mucus membranes or skin necrosis: No Has patient had a PCN reaction that required hospitalization: Yes Has patient had a PCN reaction occurring within the last 10 years: No If all of the above answers are NO, then may proceed with Cephalosporin use.  Has patient had a PCN reaction causing immediate rash, facial/tongue/throat swelling, SOB or lightheadedness with hypotension: Yes Has patient had a PCN reaction causing severe rash involving mucus membranes or skin necrosis: No Has patient  had a PCN reaction that required hospitalization: Yes Has patient had a PCN reaction occurring within the last 10 years: No If all of the above answers are NO, then may proceed with Cephalosporin use. Has patient had a PCN reaction causing immediate rash, facial/tongue/throat swelling, SOB or lightheadedness with hypotension: Yes Has patient had a PCN reaction causing severe rash involving mucus membranes or skin necrosis: No Has patient had a PCN reaction that required hospitalization: Yes Has patient had a PCN reaction occurring within the last 10 years: No If all of the above answers are NO, then may proceed with Cephalosporin use.   Sulfa Antibiotics Hives   Tape Other (See Comments)    Large Boils-PAPER TAPE OK TO USE Large Boils-PAPER TAPE OK TO USE

## 2024-07-03 ENCOUNTER — Other Ambulatory Visit: Payer: Self-pay

## 2024-07-03 ENCOUNTER — Telehealth: Payer: Self-pay

## 2024-07-03 DIAGNOSIS — Z1211 Encounter for screening for malignant neoplasm of colon: Secondary | ICD-10-CM

## 2024-07-03 MED ORDER — NA SULFATE-K SULFATE-MG SULF 17.5-3.13-1.6 GM/177ML PO SOLN: 1.0000 | Freq: Once | ORAL | 0 refills | Status: AC

## 2024-07-03 NOTE — Telephone Encounter (Signed)
 Gastroenterology Pre-Procedure Review  Request Date: 10/01/2024 Requesting Physician: Dr. Jinny  PATIENT REVIEW QUESTIONS: The patient responded to the following health history questions as indicated:    1. Are you having any GI issues? no 2. Do you have a personal history of Polyps? no 3. Do you have a family history of Colon Cancer or Polyps? no 4. Diabetes Mellitus? no 5. Joint replacements in the past 12 months?no 6. Major health problems in the past 3 months?no 7. Any artificial heart valves, MVP, or defibrillator?no but she has a vagus nerve stimulator     MEDICATIONS & ALLERGIES:    Patient reports the following regarding taking any anticoagulation/antiplatelet therapy:   Plavix, Coumadin, Eliquis, Xarelto, Lovenox, Pradaxa, Brilinta, or Effient? no Aspirin? no  Patient confirms/reports the following medications:  Current Outpatient Medications  Medication Sig Dispense Refill   ascorbic acid (VITAMIN C) 1000 MG tablet Take by mouth.     bimekizumab-bkzx (BIMZELX) 320 MG/2ML pen Inject into the skin.     BIMZELX 320 MG/2ML pen Inject the contents of 1 auto-injector (320 mg) under the skin every 2 weeks for maintenance     Brivaracetam (BRIVIACT) 100 MG TABS Take 1 tablet by mouth 2 (two) times daily.     clindamycin  (CLEOCIN ) 300 MG capsule Take 1 capsule (300 mg total) by mouth 2 (two) times daily. Take for 7 d for HS flares 14 capsule 2   felbamate  (FELBATOL ) 600 MG tablet Take 600 mg by mouth 3 (three) times daily.     HYDROcodone -acetaminophen  (NORCO/VICODIN) 5-325 MG tablet Take 1 tablet by mouth every 4 (four) hours as needed. 12 tablet 0   magnesium oxide (MAG-OX) 400 MG tablet Take by mouth.     Melatonin 3 MG CAPS Take by mouth.     metFORMIN (GLUCOPHAGE) 500 MG tablet Take by mouth.     nystatin  cream (MYCOSTATIN ) Apply to affected area 2 times daily 30 g 0   ondansetron  (ZOFRAN -ODT) 4 MG disintegrating tablet Take 1 tablet (4 mg total) by mouth every 8 (eight) hours  as needed. 20 tablet 0   oxyCODONE  (OXY IR/ROXICODONE ) 5 MG immediate release tablet Take 5 mg by mouth every 6 (six) hours as needed.     pantoprazole  (PROTONIX ) 40 MG tablet Take 1 tablet (40 mg total) by mouth daily. 90 tablet 1   pyridOXINE (VITAMIN B-6) 100 MG tablet Take 100 mg by mouth daily.     spironolactone (ALDACTONE) 100 MG tablet Take by mouth.     SUMAtriptan  (IMITREX ) 50 MG tablet Take 1 tablet (50 mg total) by mouth daily as needed. 10 tablet 1   tiZANidine (ZANAFLEX) 2 MG tablet Take 2 mg by mouth 2 (two) times daily as needed.     topiramate (TOPAMAX) 25 MG tablet Take 50 mg by mouth at bedtime.     TURMERIC CURCUMIN PO Take 2 capsules by mouth daily.     Vitamin D , Ergocalciferol , (DRISDOL) 1.25 MG (50000 UNIT) CAPS capsule Take 50,000 Units by mouth every 7 (seven) days.     zinc gluconate 50 MG tablet Take 50 mg by mouth daily.     No current facility-administered medications for this visit.    Patient confirms/reports the following allergies:  Allergies[1]  No orders of the defined types were placed in this encounter.   AUTHORIZATION INFORMATION Primary Insurance: 1D#: Group #:  Secondary Insurance: 1D#: Group #:  SCHEDULE INFORMATION: Date: 014/08/2024 Time: Location: [facility] Dr. Jinny     [1]  Allergies Allergen  Reactions   Codeine Anaphylaxis   Penicillins Anaphylaxis and Other (See Comments)    Has patient had a PCN reaction causing immediate rash, facial/tongue/throat swelling, SOB or lightheadedness with hypotension: Yes Has patient had a PCN reaction causing severe rash involving mucus membranes or skin necrosis: No Has patient had a PCN reaction that required hospitalization: Yes Has patient had a PCN reaction occurring within the last 10 years: No If all of the above answers are NO, then may proceed with Cephalosporin use.  Has patient had a PCN reaction causing immediate rash, facial/tongue/throat swelling, SOB or lightheadedness with  hypotension: Yes Has patient had a PCN reaction causing severe rash involving mucus membranes or skin necrosis: No Has patient had a PCN reaction that required hospitalization: Yes Has patient had a PCN reaction occurring within the last 10 years: No If all of the above answers are NO, then may proceed with Cephalosporin use. Has patient had a PCN reaction causing immediate rash, facial/tongue/throat swelling, SOB or lightheadedness with hypotension: Yes Has patient had a PCN reaction causing severe rash involving mucus membranes or skin necrosis: No Has patient had a PCN reaction that required hospitalization: Yes Has patient had a PCN reaction occurring within the last 10 years: No If all of the above answers are NO, then may proceed with Cephalosporin use.   Sulfa Antibiotics Hives   Tape Other (See Comments)    Large Boils-PAPER TAPE OK TO USE Large Boils-PAPER TAPE OK TO USE

## 2024-07-08 NOTE — Telephone Encounter (Signed)
 Dr. Clorinda Cecil sent us  our clearance form back but with some instructions for the anesthesiologist and Dr. Jinny to follow. I put the form to be scanned in patient's chart. I also sent a copy to [place] from the endoscopy unit for her and the physicians to review and then get back to me with their recommendations or decisions on what to do with patient's case.

## 2024-07-21 NOTE — Progress Notes (Signed)
 Following up with you on this patient that you had faxed a MD note to me on 07-09-23. I did speak with Dr. Lynwood Clause (anesthesiologist) and he read the patient's information. He states there is no reason from an anesthesia standpoint for this patient to not have her Colonoscopy 10-01-24 with Dr. Jinny. The vagus nerve stimulator will not cause any issues for her and her history of seizures is not a contraindication either. She should definitely stay on her anti-seizure medications, even the morning of the procedure. I will enter a note with her procedure posting to make everyone aware of the vagus nerve stimulator and her history of seizures.

## 2024-07-21 NOTE — Telephone Encounter (Signed)
 Patient will be able to have her colonoscopy done at [facility] per Dr. Lynwood Clause after reviewing her case.

## 2024-10-01 ENCOUNTER — Ambulatory Visit: Admit: 2024-10-01 | Admitting: Gastroenterology

## 2024-10-01 SURGERY — COLONOSCOPY
Anesthesia: General

## 2024-12-30 ENCOUNTER — Ambulatory Visit: Admitting: Internal Medicine
